# Patient Record
Sex: Female | Born: 1975
Health system: Midwestern US, Community
[De-identification: ages and names within clinical notes are randomized; demographics above are authoritative.]

## PROBLEM LIST (undated history)

## (undated) DIAGNOSIS — M5430 Sciatica, unspecified side: Secondary | ICD-10-CM

## (undated) DIAGNOSIS — R0789 Other chest pain: Secondary | ICD-10-CM

## (undated) DIAGNOSIS — N951 Menopausal and female climacteric states: Principal | ICD-10-CM

## (undated) DIAGNOSIS — R200 Anesthesia of skin: Secondary | ICD-10-CM

## (undated) DIAGNOSIS — G8929 Other chronic pain: Secondary | ICD-10-CM

## (undated) DIAGNOSIS — D3A019 Benign carcinoid tumor of the small intestine, unspecified portion: Secondary | ICD-10-CM

## (undated) DIAGNOSIS — Z0000 Encounter for general adult medical examination without abnormal findings: Secondary | ICD-10-CM

## (undated) DIAGNOSIS — K8689 Other specified diseases of pancreas: Secondary | ICD-10-CM

## (undated) DIAGNOSIS — N644 Mastodynia: Secondary | ICD-10-CM

## (undated) DIAGNOSIS — N3946 Mixed incontinence: Secondary | ICD-10-CM

## (undated) DIAGNOSIS — E28319 Asymptomatic premature menopause: Secondary | ICD-10-CM

## (undated) DIAGNOSIS — J383 Other diseases of vocal cords: Secondary | ICD-10-CM

## (undated) DIAGNOSIS — M7062 Trochanteric bursitis, left hip: Secondary | ICD-10-CM

## (undated) DIAGNOSIS — N62 Hypertrophy of breast: Principal | ICD-10-CM

## (undated) DIAGNOSIS — M545 Low back pain, unspecified: Secondary | ICD-10-CM

## (undated) DIAGNOSIS — C7A011 Malignant carcinoid tumor of the jejunum: Principal | ICD-10-CM

## (undated) DIAGNOSIS — H93293 Other abnormal auditory perceptions, bilateral: Secondary | ICD-10-CM

## (undated) DIAGNOSIS — M7061 Trochanteric bursitis, right hip: Principal | ICD-10-CM

## (undated) DIAGNOSIS — Z1231 Encounter for screening mammogram for malignant neoplasm of breast: Secondary | ICD-10-CM

## (undated) DIAGNOSIS — S161XXA Strain of muscle, fascia and tendon at neck level, initial encounter: Secondary | ICD-10-CM

## (undated) DIAGNOSIS — R1011 Right upper quadrant pain: Secondary | ICD-10-CM

---

## 2001-02-04 ENCOUNTER — Other Ambulatory Visit: Admission: RE | Admit: 2001-02-04 | Discharge: 2001-02-04 | Payer: Self-pay | Admitting: General Practice

## 2002-03-17 ENCOUNTER — Other Ambulatory Visit: Admission: RE | Admit: 2002-03-17 | Discharge: 2002-03-17 | Payer: Self-pay | Admitting: Obstetrics and Gynecology

## 2003-02-24 ENCOUNTER — Ambulatory Visit (HOSPITAL_COMMUNITY): Admission: RE | Admit: 2003-02-24 | Discharge: 2003-02-24 | Payer: Self-pay | Admitting: Obstetrics and Gynecology

## 2003-10-10 ENCOUNTER — Other Ambulatory Visit: Admission: RE | Admit: 2003-10-10 | Discharge: 2003-10-10 | Payer: Self-pay | Admitting: Obstetrics and Gynecology

## 2004-01-17 ENCOUNTER — Inpatient Hospital Stay (HOSPITAL_COMMUNITY): Admission: AD | Admit: 2004-01-17 | Discharge: 2004-01-17 | Payer: Self-pay | Admitting: Obstetrics and Gynecology

## 2004-01-25 ENCOUNTER — Ambulatory Visit (HOSPITAL_COMMUNITY): Admission: RE | Admit: 2004-01-25 | Discharge: 2004-01-25 | Payer: Self-pay | Admitting: Obstetrics and Gynecology

## 2004-07-23 ENCOUNTER — Inpatient Hospital Stay (HOSPITAL_COMMUNITY): Admission: AD | Admit: 2004-07-23 | Discharge: 2004-07-23 | Payer: Self-pay | Admitting: Obstetrics and Gynecology

## 2004-08-28 ENCOUNTER — Inpatient Hospital Stay (HOSPITAL_COMMUNITY): Admission: AD | Admit: 2004-08-28 | Discharge: 2004-08-28 | Payer: Self-pay | Admitting: Obstetrics and Gynecology

## 2004-09-11 ENCOUNTER — Inpatient Hospital Stay (HOSPITAL_COMMUNITY): Admission: AD | Admit: 2004-09-11 | Discharge: 2004-09-14 | Payer: Self-pay | Admitting: Obstetrics and Gynecology

## 2004-09-15 ENCOUNTER — Encounter: Admission: RE | Admit: 2004-09-15 | Discharge: 2004-10-15 | Payer: Self-pay | Admitting: Obstetrics and Gynecology

## 2004-10-16 ENCOUNTER — Encounter: Admission: RE | Admit: 2004-10-16 | Discharge: 2004-11-15 | Payer: Self-pay | Admitting: Obstetrics and Gynecology

## 2004-10-22 ENCOUNTER — Other Ambulatory Visit: Admission: RE | Admit: 2004-10-22 | Discharge: 2004-10-22 | Payer: Self-pay | Admitting: Obstetrics and Gynecology

## 2004-12-16 ENCOUNTER — Encounter: Admission: RE | Admit: 2004-12-16 | Discharge: 2005-01-15 | Payer: Self-pay | Admitting: Obstetrics and Gynecology

## 2005-02-15 ENCOUNTER — Encounter: Admission: RE | Admit: 2005-02-15 | Discharge: 2005-03-17 | Payer: Self-pay | Admitting: Obstetrics and Gynecology

## 2005-03-18 ENCOUNTER — Encounter: Admission: RE | Admit: 2005-03-18 | Discharge: 2005-04-16 | Payer: Self-pay | Admitting: Obstetrics and Gynecology

## 2005-04-17 ENCOUNTER — Encounter: Admission: RE | Admit: 2005-04-17 | Discharge: 2005-05-17 | Payer: Self-pay | Admitting: Obstetrics and Gynecology

## 2005-05-18 ENCOUNTER — Encounter: Admission: RE | Admit: 2005-05-18 | Discharge: 2005-06-16 | Payer: Self-pay | Admitting: Obstetrics and Gynecology

## 2005-06-02 ENCOUNTER — Encounter: Admission: RE | Admit: 2005-06-02 | Discharge: 2005-06-02 | Payer: Self-pay | Admitting: Family Medicine

## 2005-06-11 IMAGING — US US OB TRANSVAGINAL
1 series · 18 of 28 positions shown · non-contrast
Comparison: none

CLINICAL DATA: Reevaluate early gestation.
TRANSVAGINAL OBSTETRICAL ULTRASOUND:
Multiple images of the gravid uterus were obtained using an endovaginal approach.
There is a single intrauterine pregnancy identified that demonstrates an estimated gestational age by crown rump length of 6 weeks and 6 days.  Positive regular fetal cardiac activity with a rate of 120 bpm was noted.  A normal appearing yolk sac and amnion are seen.  No evidence for subchorionic hemorrhage is noted.  
Both ovaries are visualized with the right ovary measuring 3.6 x 3.0 x 2.1 cm and containing a corpus luteum cyst.  The left ovary measures 1.6 x 2.6 x 1.7 cm and has a normal appearance.  No cul-de-sac or periovarian fluid is noted.  No separate adnexal masses are seen.
IMPRESSION
6 week 6 day living intrauterine pregnancy.

[Series 1: us ob transvaginal · 18 of 34 slices shown]
[im 1/34]
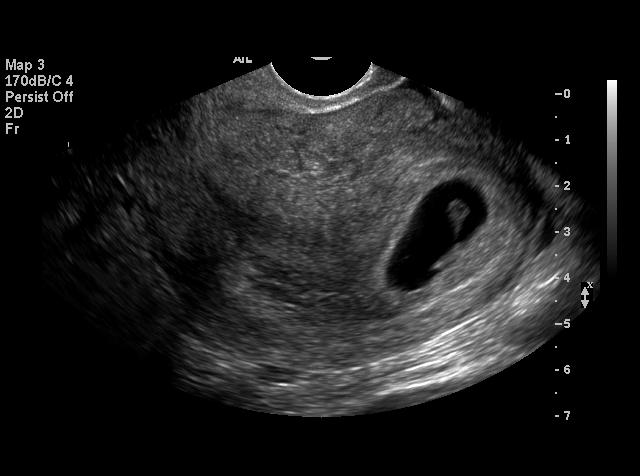
[im 3/34]
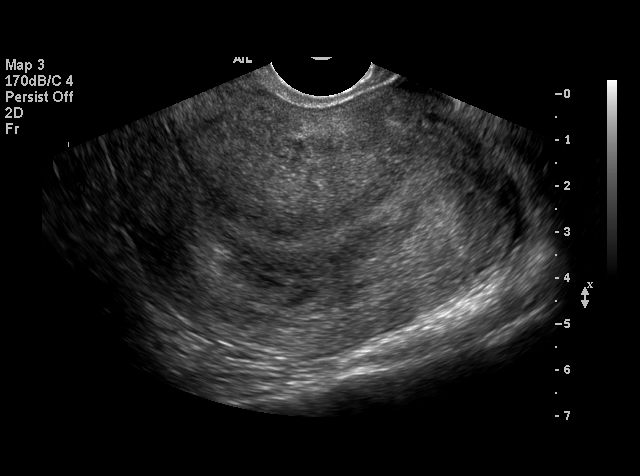
[im 4/34]
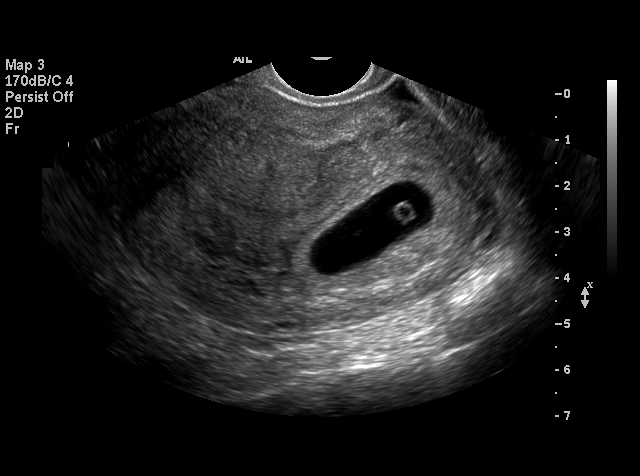
[im 7/34]
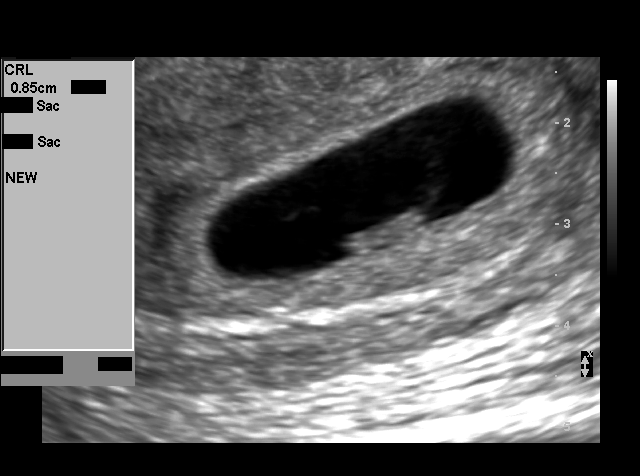
[im 9/34]
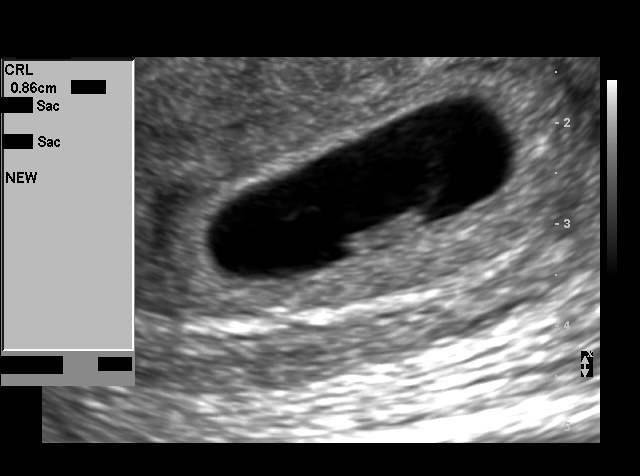
[im 10/34]
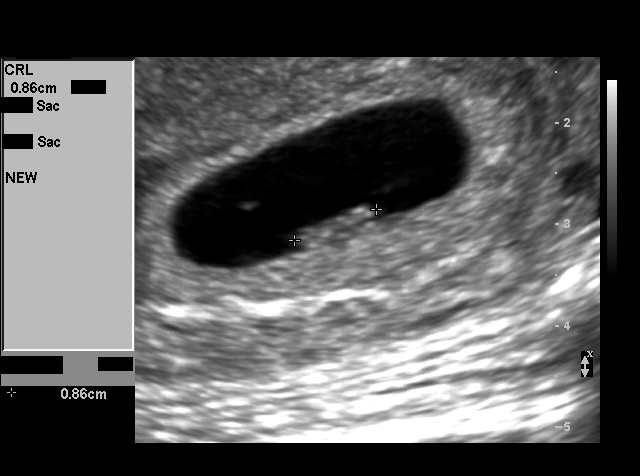
[im 13/34]
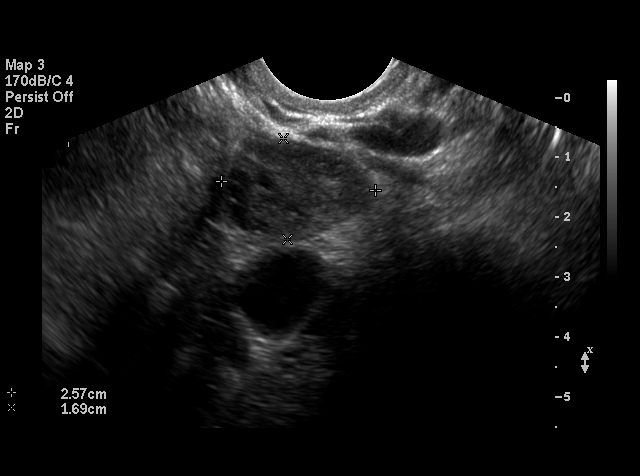
[im 14/34]
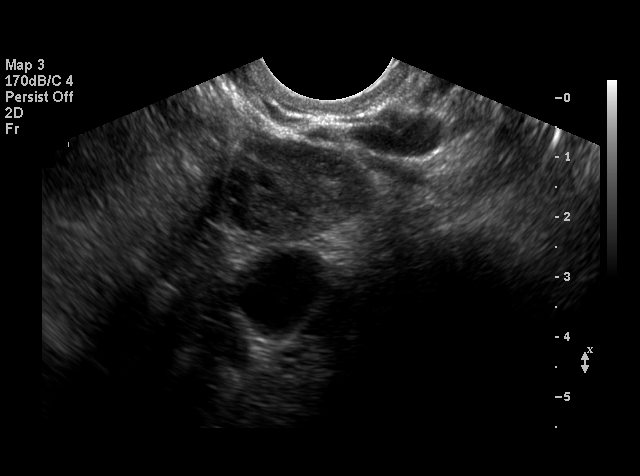
[im 16/34]
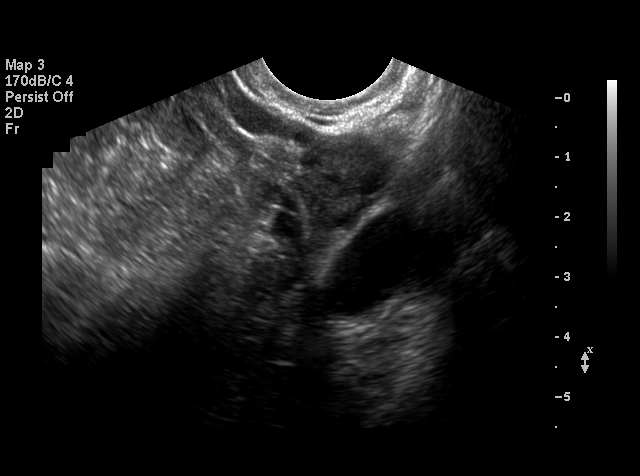
[im 18/34]
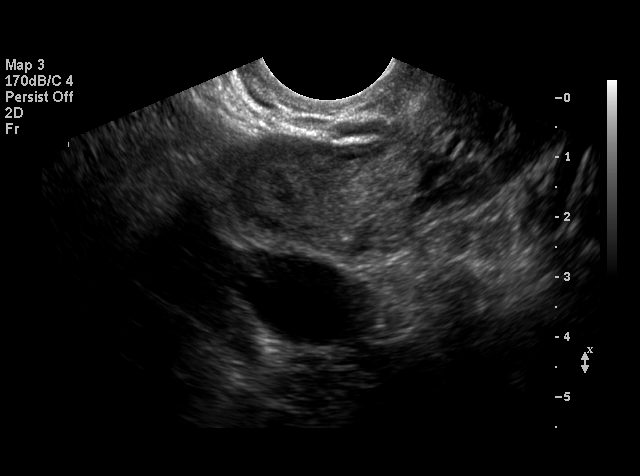
[im 20/34]
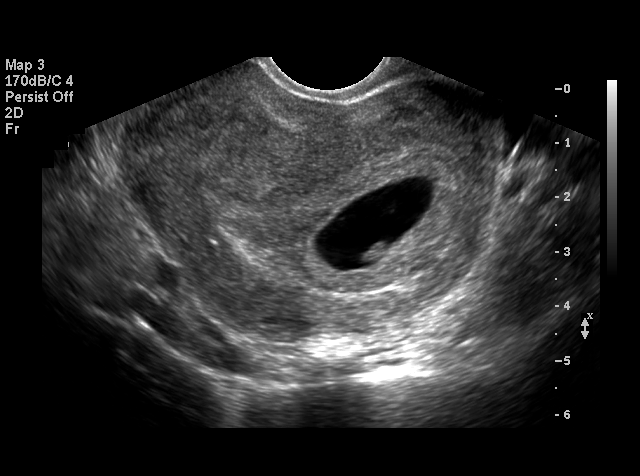
[im 21/34]
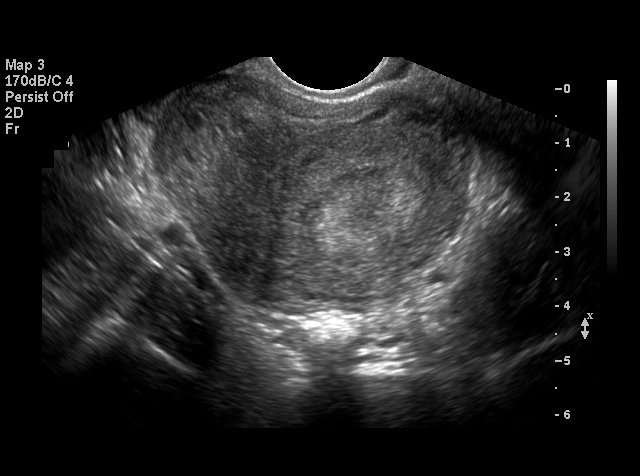
[im 24/34]
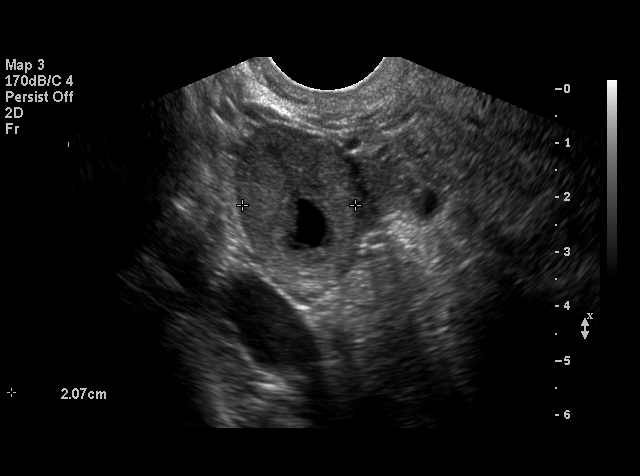
[im 26/34]
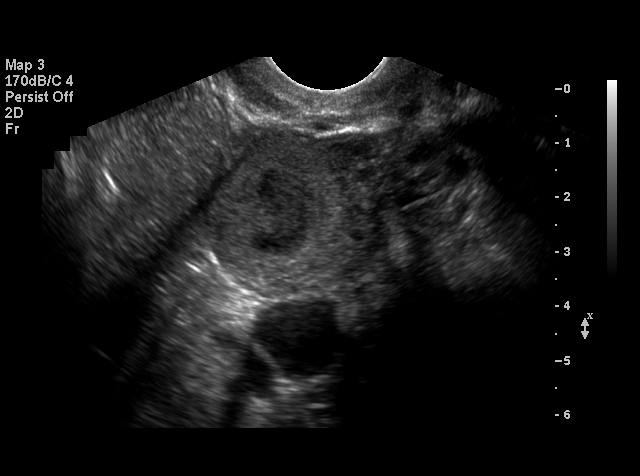
[im 27/34]
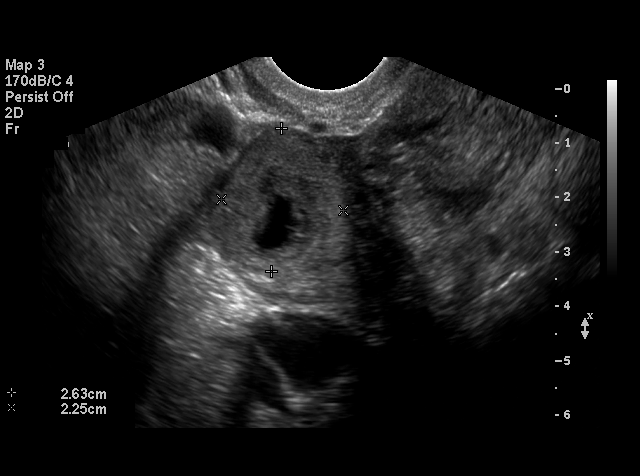
[im 30/34]
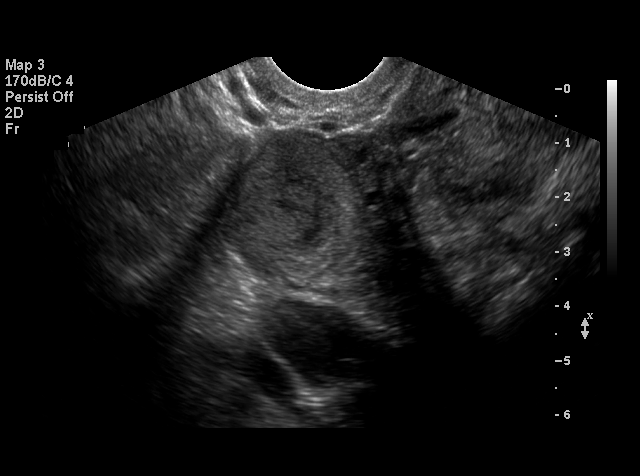
[im 31/34]
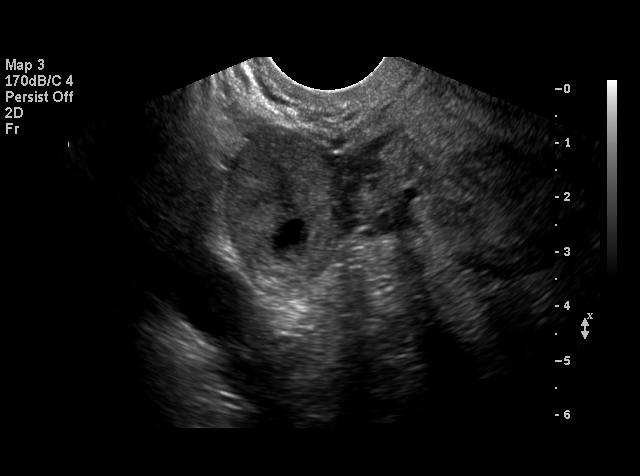
[im 34/34]
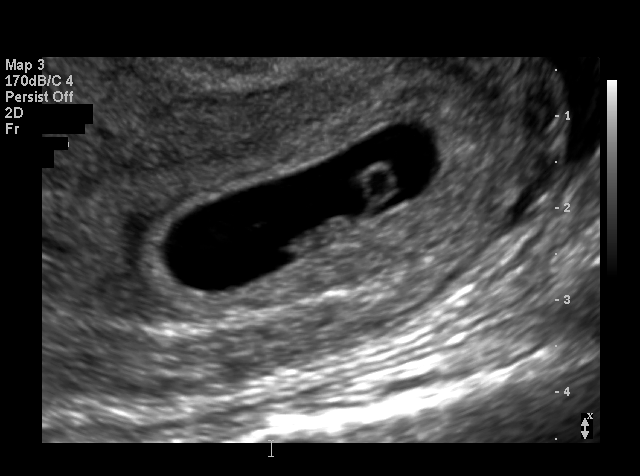

[18 of 28 positions shown; findings below may reference images not displayed]

## 2005-06-17 ENCOUNTER — Encounter: Admission: RE | Admit: 2005-06-17 | Discharge: 2005-07-11 | Payer: Self-pay | Admitting: Obstetrics and Gynecology

## 2005-12-15 ENCOUNTER — Other Ambulatory Visit: Admission: RE | Admit: 2005-12-15 | Discharge: 2005-12-15 | Payer: Self-pay | Admitting: Obstetrics and Gynecology

## 2006-12-22 ENCOUNTER — Emergency Department (HOSPITAL_COMMUNITY): Admission: EM | Admit: 2006-12-22 | Discharge: 2006-12-22 | Payer: Self-pay | Admitting: Emergency Medicine

## 2006-12-25 ENCOUNTER — Other Ambulatory Visit: Admission: RE | Admit: 2006-12-25 | Discharge: 2006-12-25 | Payer: Self-pay | Admitting: Obstetrics and Gynecology

## 2008-01-25 ENCOUNTER — Other Ambulatory Visit: Admission: RE | Admit: 2008-01-25 | Discharge: 2008-01-25 | Payer: Self-pay | Admitting: Family Medicine

## 2008-05-08 IMAGING — US US PELVIS COMPLETE MODIFY
1 series · 14 of 25 positions shown · non-contrast
Comparison: none

CLINICAL DATA: 31-year-old female with pelvic pain with negative pregnancy test.   
 TRANSABDOMINAL AND TRANSVAGINAL PELVIC ULTRASOUND:
TECHNIQUE: Both transabdominal and transvaginal ultrasound examinations of the pelvis were performed including evaluation of the uterus, ovaries, adnexal regions, and pelvic cul-de-sac.

[Series 1: unknown · 0.27mm/px · 14 of 50 slices shown]
[im 1/50]
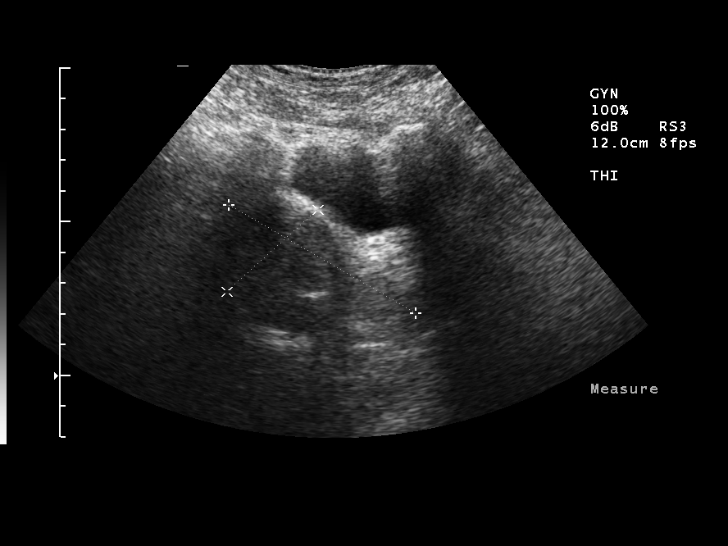
[im 5/50]
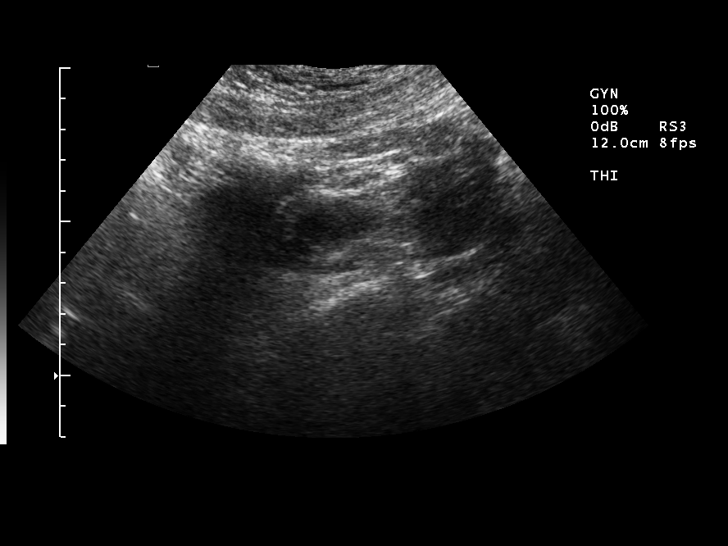
[im 9/50]
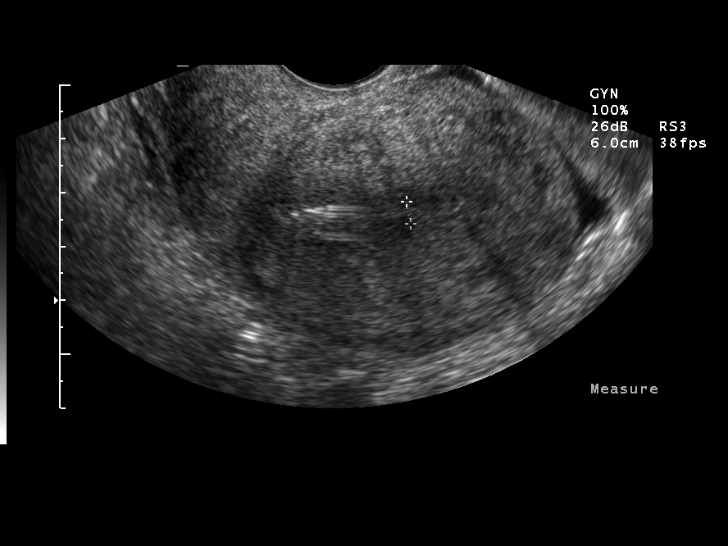
[im 13/50]
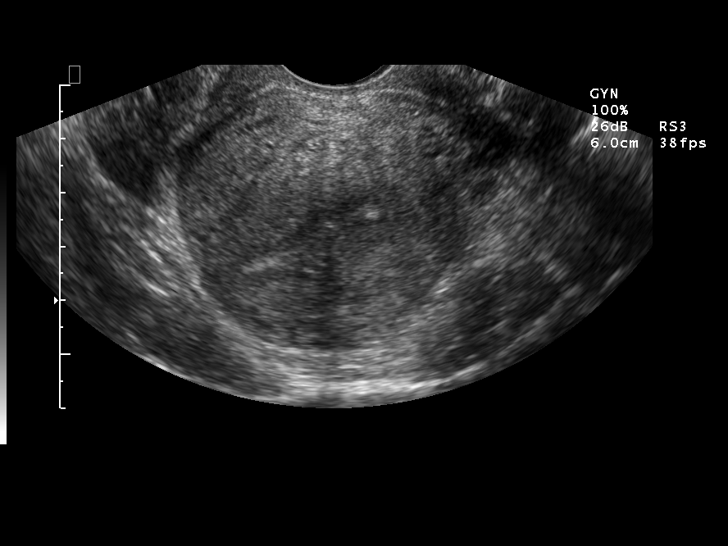
[im 17/50]
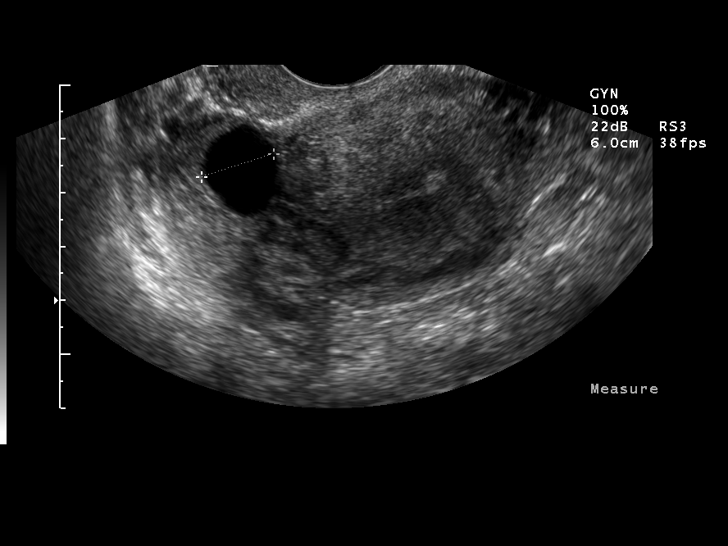
[im 19/50]
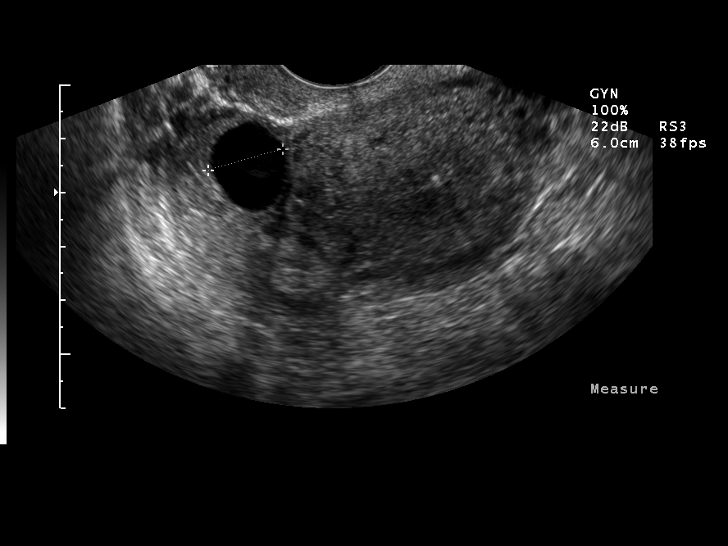
[im 23/50]
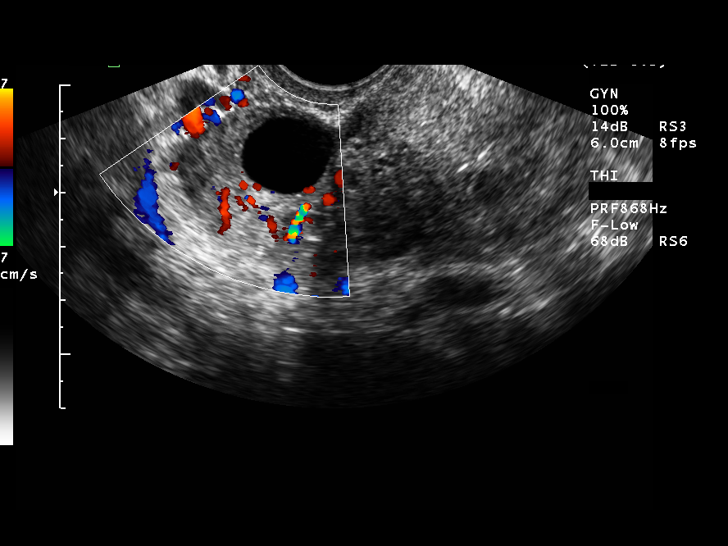
[im 27/50]
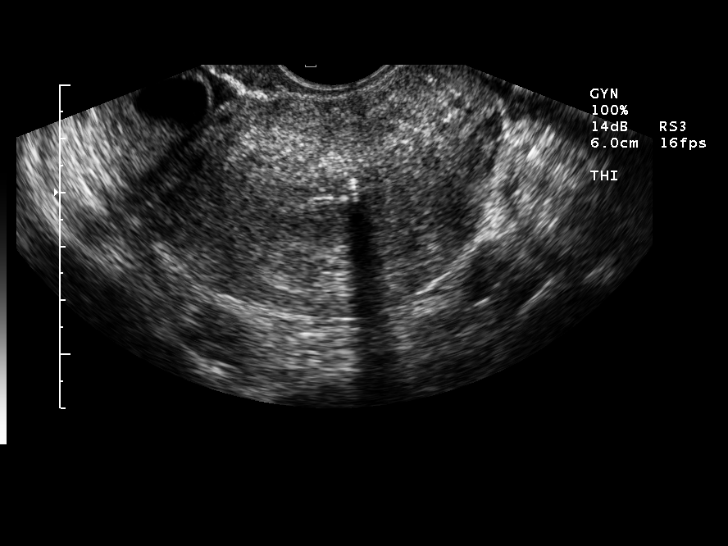
[im 31/50]
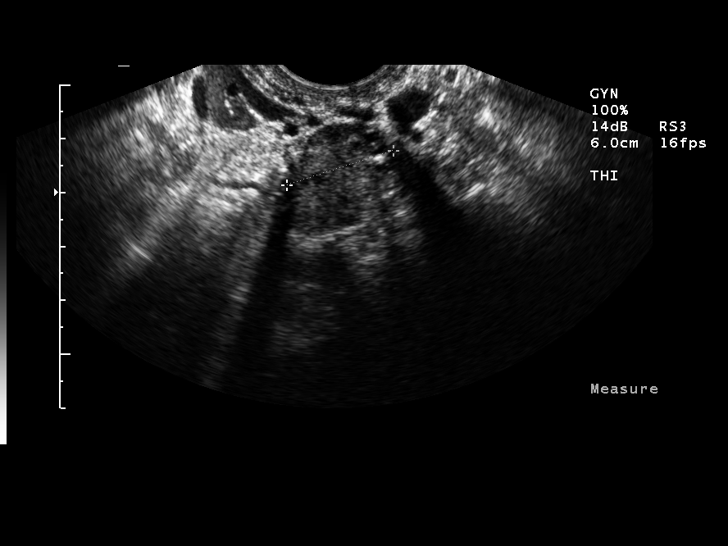
[im 33/50]
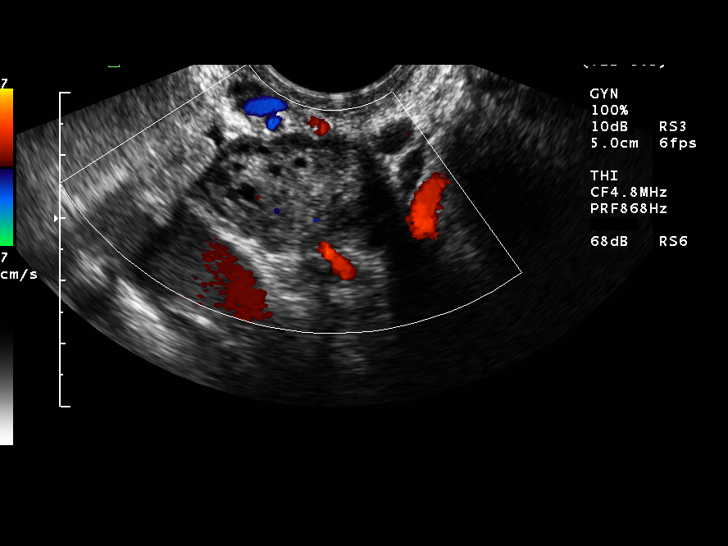
[im 37/50]
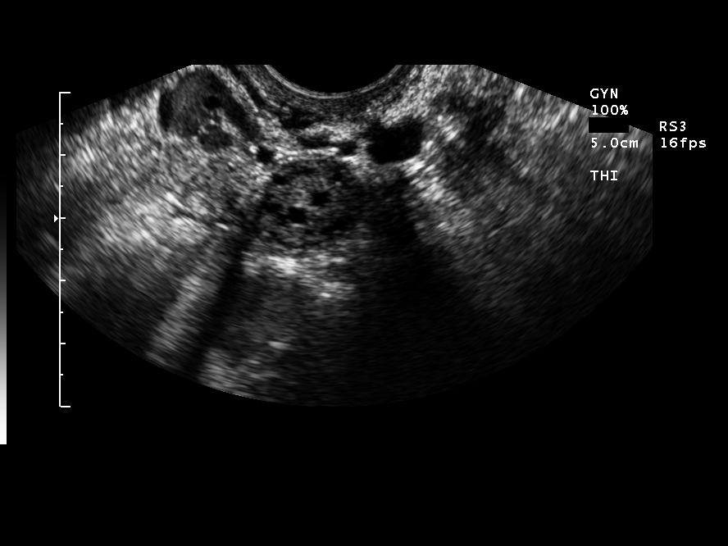
[im 41/50]
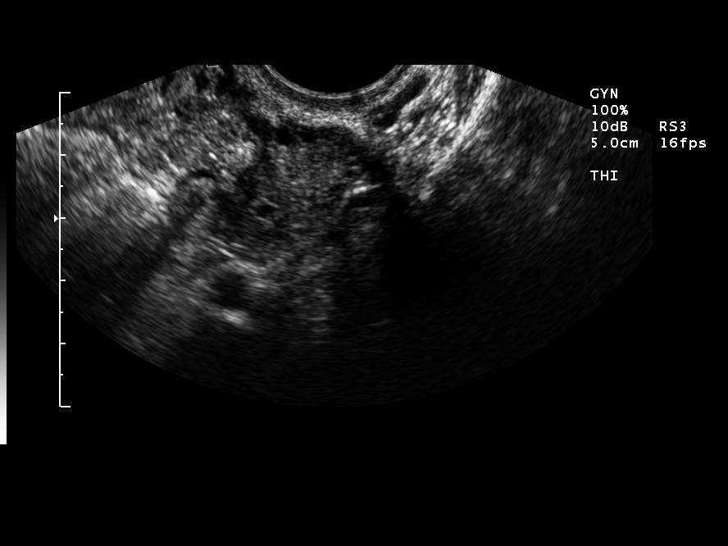
[im 45/50]
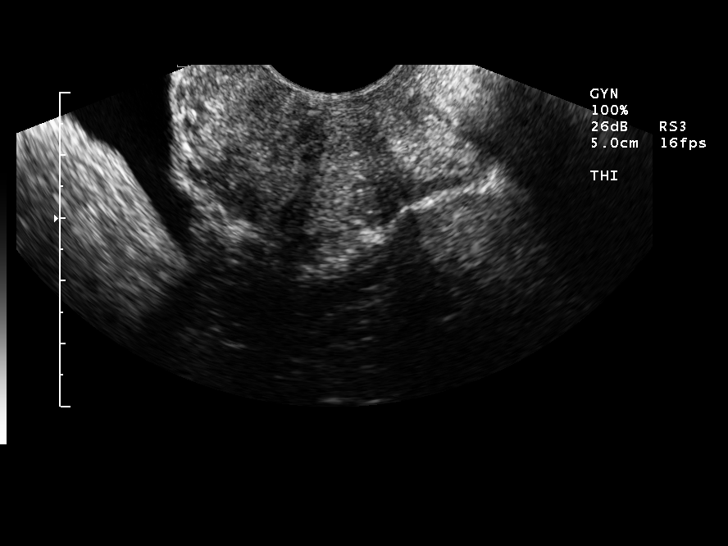
[im 50/50]
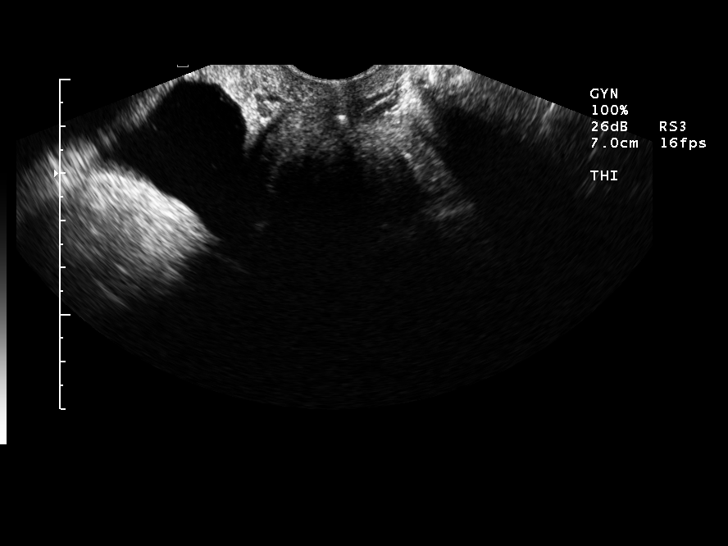

[14 of 25 positions shown; findings below may reference images not displayed]

FINDINGS: The uterus is retroverted measuring 9.5 x 5 x 6.3 cm.  An IUD is in place within the endometrial canal.  Endometrial stripe is grossly unremarkable measuring up to 4 mm in greatest diameter.  A 2 x 1.5 cm cyst/follicle within the right ovary is noted.  Remainder of the ovaries is unremarkable.  A small to moderate amount of free fluid within the pelvis is identified.
 There is no evidence of adnexal mass.
IMPRESSION: 1.   Small to moderate amount of free fluid within the pelvis - nonspecific. 
 2.  2 x 1.5 cm right ovarian cyst/follicle.
 3.  IUD.

## 2009-02-13 ENCOUNTER — Emergency Department (HOSPITAL_COMMUNITY): Admission: EM | Admit: 2009-02-13 | Discharge: 2009-02-14 | Payer: Self-pay | Admitting: Emergency Medicine

## 2009-04-17 ENCOUNTER — Other Ambulatory Visit: Admission: RE | Admit: 2009-04-17 | Discharge: 2009-04-17 | Payer: Self-pay | Admitting: Family Medicine

## 2009-05-21 ENCOUNTER — Encounter: Admission: RE | Admit: 2009-05-21 | Discharge: 2009-05-21 | Payer: Self-pay | Admitting: Diagnostic Neuroimaging

## 2009-09-25 ENCOUNTER — Ambulatory Visit (HOSPITAL_COMMUNITY): Admission: RE | Admit: 2009-09-25 | Discharge: 2009-09-25 | Payer: Self-pay | Admitting: General Surgery

## 2010-05-08 ENCOUNTER — Other Ambulatory Visit: Admission: RE | Admit: 2010-05-08 | Discharge: 2010-05-08 | Payer: Self-pay | Admitting: Obstetrics and Gynecology

## 2010-08-10 ENCOUNTER — Emergency Department (HOSPITAL_COMMUNITY)
Admission: EM | Admit: 2010-08-10 | Discharge: 2010-08-10 | Payer: Self-pay | Source: Home / Self Care | Admitting: Emergency Medicine

## 2010-09-02 ENCOUNTER — Encounter (HOSPITAL_COMMUNITY): Payer: Self-pay

## 2010-09-03 ENCOUNTER — Ambulatory Visit (HOSPITAL_COMMUNITY)
Admission: RE | Admit: 2010-09-03 | Discharge: 2010-09-03 | Disposition: A | Discharge status: Home / Self Care | Payer: 59 | Source: Ambulatory Visit | Attending: Cardiology | Admitting: Cardiology

## 2010-09-03 DIAGNOSIS — R0609 Other forms of dyspnea: Secondary | ICD-10-CM | POA: Insufficient documentation

## 2010-09-03 DIAGNOSIS — R0989 Other specified symptoms and signs involving the circulatory and respiratory systems: Secondary | ICD-10-CM | POA: Insufficient documentation

## 2010-10-06 LAB — URINALYSIS, ROUTINE W REFLEX MICROSCOPIC
Bilirubin Urine: NEGATIVE
Glucose, UA: NEGATIVE mg/dL
Hgb urine dipstick: NEGATIVE
Ketones, ur: NEGATIVE mg/dL
Nitrite: NEGATIVE
Protein, ur: NEGATIVE mg/dL
Specific Gravity, Urine: 1.015 (ref 1.005–1.030)
Urobilinogen, UA: 0.2 mg/dL (ref 0.0–1.0)
pH: 8 (ref 5.0–8.0)

## 2010-10-06 LAB — CBC
HCT: 38.4 % (ref 36.0–46.0)
Hemoglobin: 12 g/dL (ref 12.0–15.0)
MCHC: 31.3 g/dL (ref 30.0–36.0)
MCV: 75.5 fL — ABNORMAL LOW (ref 78.0–100.0)
Platelets: 264 10*3/uL (ref 150–400)
RBC: 5.09 MIL/uL (ref 3.87–5.11)
RDW: 13.9 % (ref 11.5–15.5)
WBC: 4.6 10*3/uL (ref 4.0–10.5)

## 2010-10-06 LAB — BASIC METABOLIC PANEL
BUN: 8 mg/dL (ref 6–23)
CO2: 25 mEq/L (ref 19–32)
Calcium: 8.8 mg/dL (ref 8.4–10.5)
Chloride: 107 mEq/L (ref 96–112)
Creatinine, Ser: 0.74 mg/dL (ref 0.4–1.2)
GFR calc Af Amer: >60 mL/min (ref >60)
GFR calc non Af Amer: >60 mL/min (ref >60)
Glucose, Bld: 81 mg/dL (ref 70–99)
Potassium: 4.2 mEq/L (ref 3.5–5.1)
Sodium: 138 mEq/L (ref 135–145)

## 2010-10-06 LAB — PREGNANCY, URINE: Preg Test, Ur: NEGATIVE

## 2010-10-06 IMAGING — CR DG CHEST 2V
2 series · 2 of 2 positions shown · non-contrast
Comparison: None

CLINICAL DATA: Disturbance of skin sensation with dizziness and
diplopia.  Cough.  Elevated ACE level

CHEST - 2 VIEW

[w chest pa]
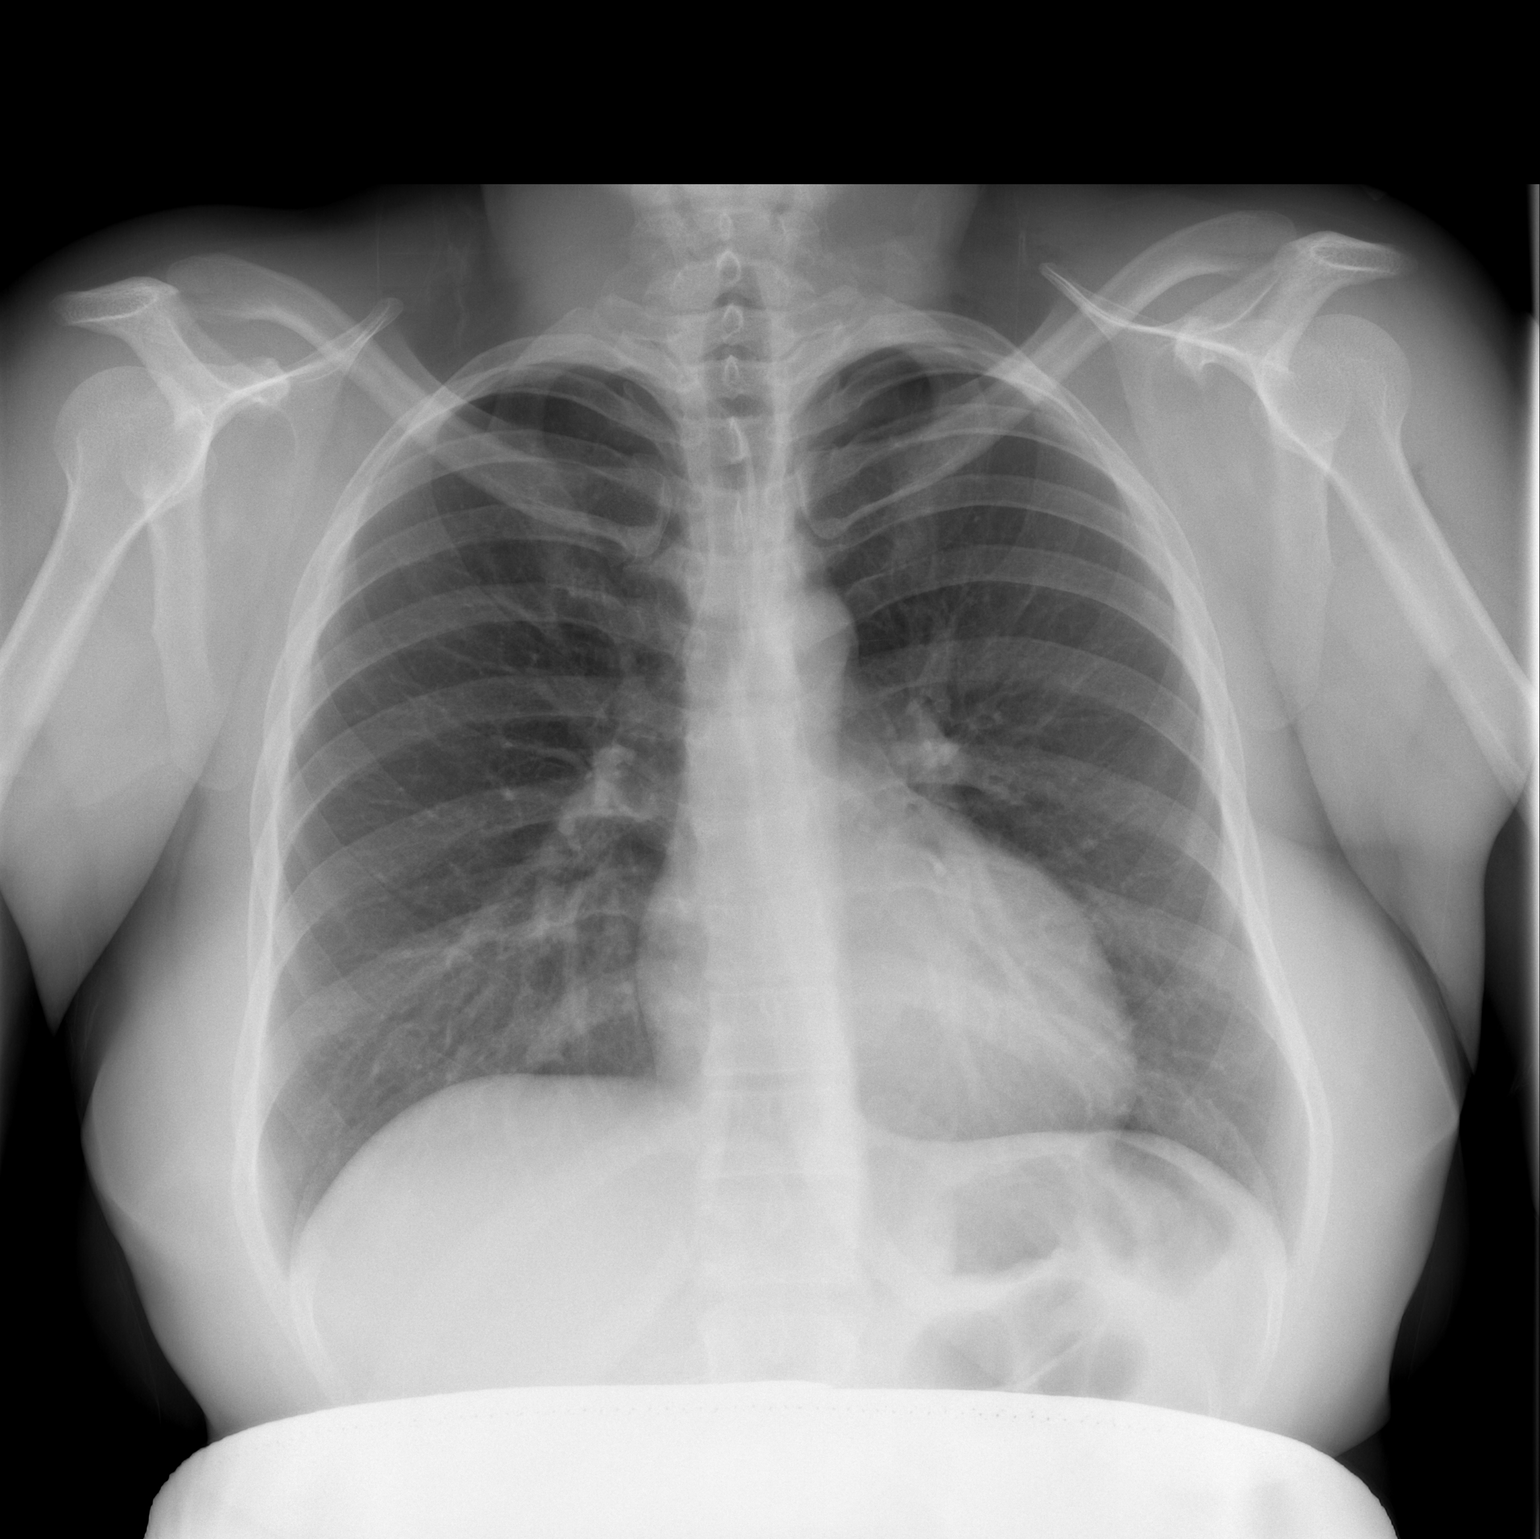

[w chest lat]
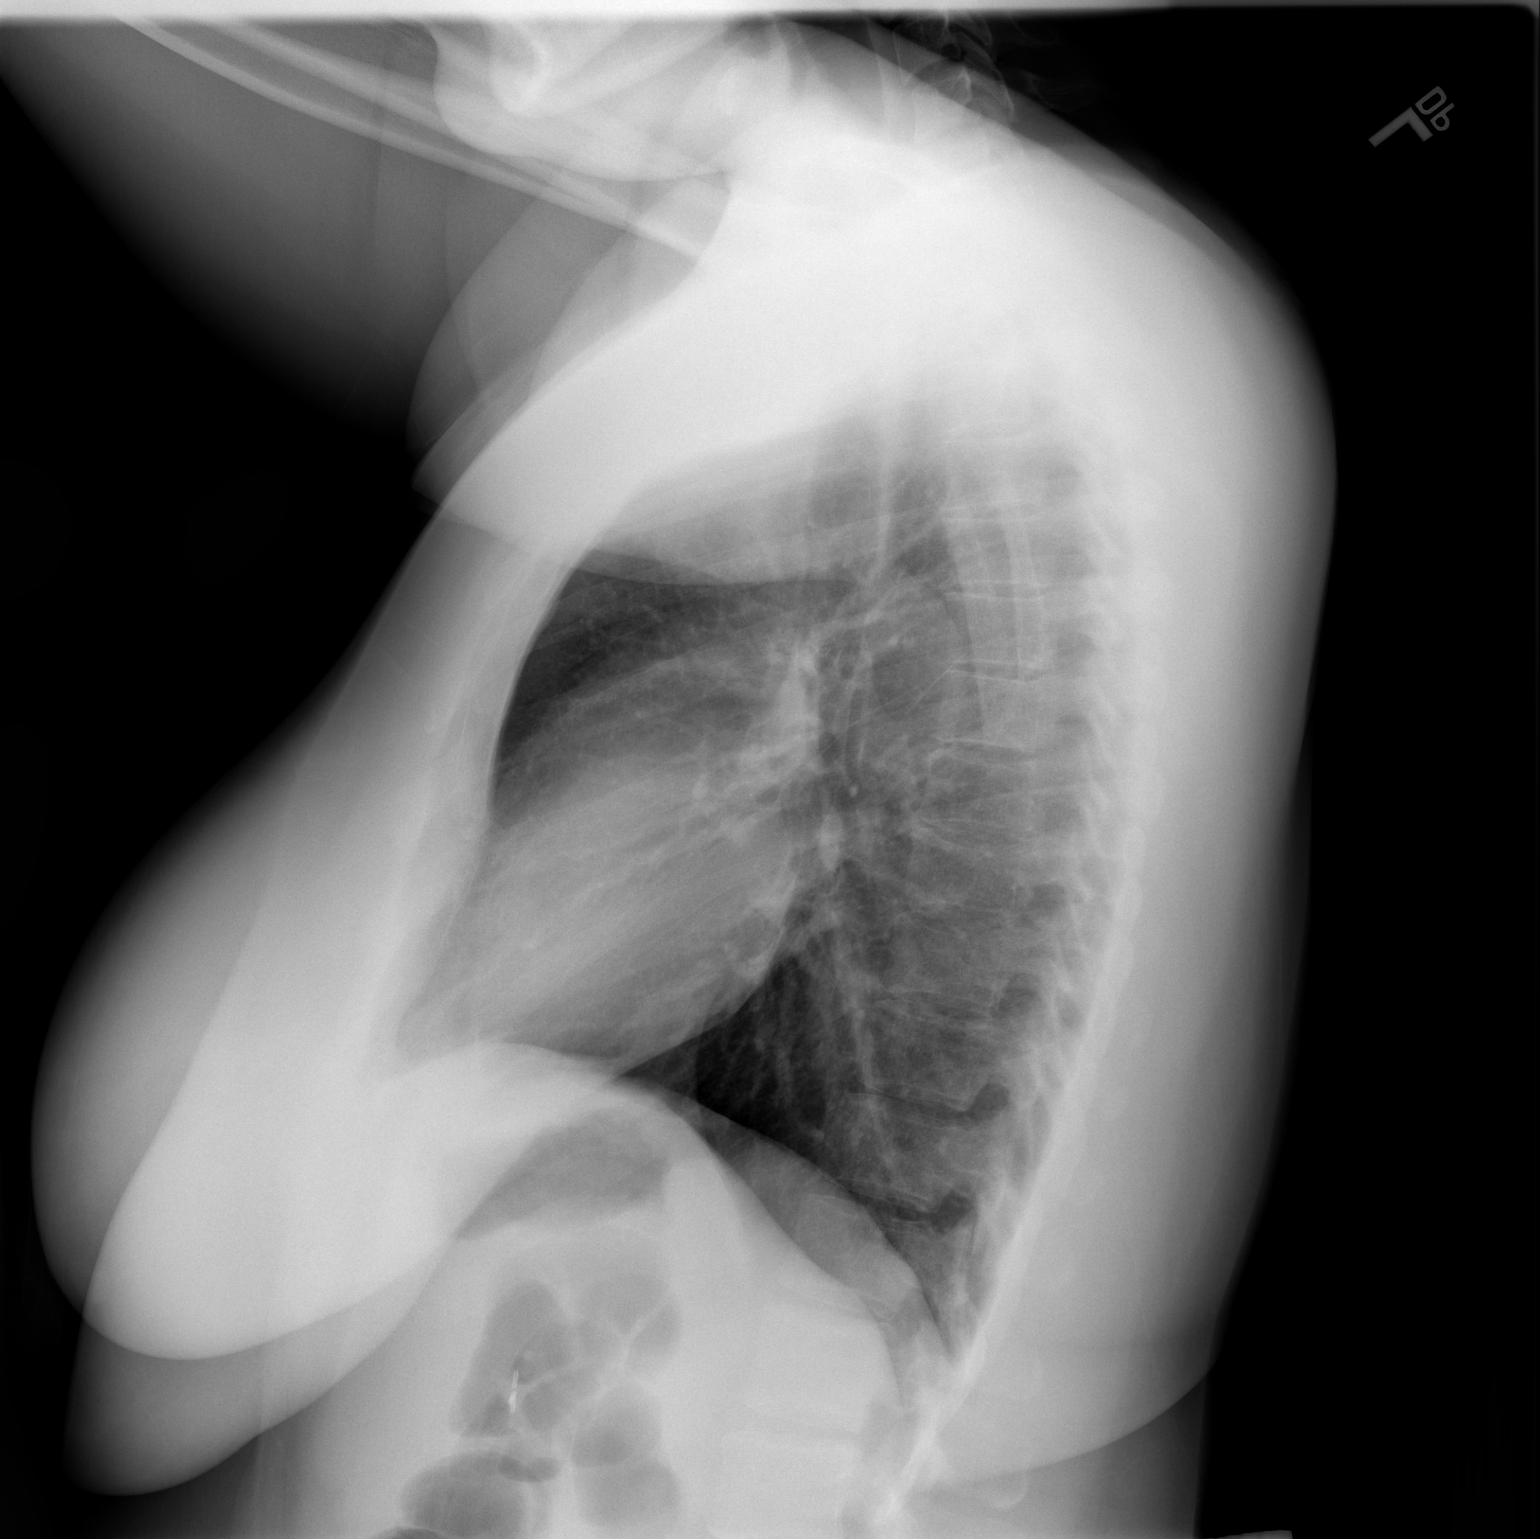

[2 of 2 positions shown; findings below may reference images not displayed]

FINDINGS: Heart and mediastinal contours are within normal limits.
The lung fields are clear with no evidence for focal infiltrate or
congestive failure.  Bony structures are intact.
IMPRESSION: No acute or focal cardiopulmonary abnormality noted.

## 2010-11-29 NOTE — H&P (Signed)
NAME:  Sherri Chavez, Sherri Chavez                         ACCOUNT NO.:  [id number]   MEDICAL RECORD NO.:  [id number]                   PATIENT TYPE:  AMB   LOCATION:  [facility]                                  FACILITY:  [facility]   PHYSICIAN:  James A. Ashley Royalty, M.D.             DATE OF BIRTH:  26-Aug-1975   DATE OF ADMISSION:  02/24/2003  DATE OF DISCHARGE:                                HISTORY & PHYSICAL   HISTORY OF PRESENT ILLNESS:  This is a 35 year old, gravida 4, para 1, AB 3,  who presented to me on February 16, 2003, complaining of lower abdominal/pelvic  discomfort.  She stated that the process had been occurring for  approximately a year, but had intensified over the last month or so.  She  described it as intermittent, however, also debilitating.  Location is  mostly on the left side, though occasionally it radiates to the right.  She  denied any associated symptoms.  She got married in March of 2004.  She was  not having sex with her husband until that time and therefore he represents  a new sexual partner as of four to five months ago.  She currently using  condoms for contraception.  She presented to my office for ultrasound on  February 23, 2003, pursuant to this discomfort.  The ultrasound revealed a  normal uterus and adnexa.  We discussed other diagnostic possibilities that  could be causing the discomfort, including endometriosis, adhesions, primary  GI, etc.  The patient stated that her symptoms were sufficiently  debilitating that she wanted to proceed with operative intervention.  Hence,  she is for diagnostic/operative laparoscopy.   MEDICATIONS:  Levaquin 500 mg daily.   PAST MEDICAL HISTORY:  Negative.   PAST SURGICAL HISTORY:  1. Cholecystectomy.  2. LEEP.  3. Unknown surgery for acid reflux.   ALLERGIES:  CODEINE AND ANY OTHER DERIVATIVE THEREOF.   FAMILY HISTORY:  Positive for colon cancer, hypertension, diabetes, and  uterine cancer.   SOCIAL HISTORY:  The patient  denies the use of tobacco or significant  alcohol.   REVIEW OF SYSTEMS:  Noncontributory.   PHYSICAL EXAMINATION:  GENERAL APPEARANCE:  A well-developed, well-  nourished, pleasant female in no acute distress.  VITAL SIGNS:  Afebrile.  Vital signs stable.  SKIN:  Warm dry without lesions.  LYMPHATICS:  No supraclavicular, cervical, or inguinal adenopathy.  HEENT:  Normocephalic.  NECK:  Supple without thyromegaly.  CHEST:  Lungs clear.  CARDIAC:  Regular rate and rhythm without murmurs, rubs, or gallops.  BREASTS:  Exam was deferred.  ABDOMEN:  Soft and nontender without masses or organomegaly.  Bowel sounds  are active.  MUSCULOSKELETAL:  Full range of motion without CVA tenderness.  PELVIC:  (February 16, 2003) External genitalia within normal limits.  The  vagina and cervix were without gross lesions.  Bimanual examination revealed  the uterus to be retroflexed versus a possible  posterior fibroid.  No  obvious adnexal masses were palpable.   IMPRESSION:  Lower abdominal/pelvic discomfort, debilitating.  The  differential includes adnexal pathology, primary gastrointestinal adhesions,  and endometriosis.   PLAN:  Diagnostic/operative laparoscopy.  The risks, benefits,  complications, and alternatives were fully discussed with the patient.  The  possibility of unilateral salpingo-oophorectomy was discussed and accepted.  The possibility of exploratory laparotomy was discussed and accepted.  Questions were invited and answered.                                               James A. Ashley Royalty, M.D.    JAM/MEDQ  D:  02/24/2003  T:  02/24/2003  Job:  [id number]

## 2010-11-29 NOTE — Op Note (Signed)
NAME:  Sherri Chavez, Sherri Chavez                         ACCOUNT NO.:  [id number]   MEDICAL RECORD NO.:  [id number]                   PATIENT TYPE:  AMB   LOCATION:  [facility]                                  FACILITY:  [facility]   PHYSICIAN:  James A. Ashley Royalty, M.D.             DATE OF BIRTH:  Nov 23, 1975   DATE OF PROCEDURE:  DATE OF DISCHARGE:                                 OPERATIVE REPORT   PREOPERATIVE DIAGNOSIS:  Pelvic pain.   POSTOPERATIVE DIAGNOSES:  1. Pelvic pain.  2. Normal pelvis.   PROCEDURE:  Diagnostic laparoscopy.   SURGEON:  Rudy Jew. Ashley Royalty, M.D.   ANESTHESIA:  General endotracheal.   ESTIMATED BLOOD LOSS:  Less than 25 mL.   COMPLICATIONS:  None.   PACKS AND DRAINS:  None.   PROCEDURE:  The patient was taken to the operating room and placed in the  dorsal supine position.  After adequate general anesthesia was administered,  she was placed in the lithotomy position and prepped and draped in the usual  manner for vaginal surgery.  A posterior weighted retractor was placed per  vagina.  The anterior lip of the cervix was grasped with a single-tooth  tenaculum.  Jarcho uterine manipulator was placed per cervix and held in  place with a tenaculum.  The bladder was drained with a red rubber catheter.  Next a 1.2 cm infraumbilical incision was made in the longitudinal plane.  A  size 10-11 disposable laparoscopic trocar was placed into the abdominal  cavity.  Its location was verified by placement of the laparoscope.  There  was no evidence of any trauma.  A pneumoperitoneum was created with CO2 and  maintained throughout the procedure.  Next, a single suprapubic 5-mm trocar  was placed in the left lower quadrants using transillumination and direct  visualization techniques.  The pelvis was thoroughly inspected.  The uterus  was normal size, shape, and contour without evidence of any polyps,  fibroids, or endometriosis.  The fallopian tubes were normal size, shape,  contour,  and length.  The ovaries were normal size, shape, and contour  without any evidence of any cysts or endometriosis.  Anterior and posterior  cul-de-sacs were clean.  The remainder of the peritoneal surfaces were  smooth and glistening.  There was absolutely no evidence of any  endometriosis or adhesions.  Appropriate photos were obtained.  Right after  this point the patient was felt to have benefitted maximum from the surgical  procedure.  The abdominal instruments were removed and pneumoperitoneum  evacuated.  The fascial defects were closed with 0 Vicryl in an interrupted  fashion.  The skin  was closed with 3-0 Monocryl in a subcuticular fashion.  Vaginal instruments  were removed.  Hemostasis was noted.  The procedure was terminated.   The patient was returned to the recovery room in good condition.  James A. Ashley Royalty, M.D.    JAM/MEDQ  D:  02/24/2003  T:  02/25/2003  Job:  [id number]

## 2011-05-01 LAB — URINALYSIS, ROUTINE W REFLEX MICROSCOPIC
Bilirubin Urine: NEGATIVE
Glucose, UA: NEGATIVE
Hgb urine dipstick: NEGATIVE
Ketones, ur: NEGATIVE
Nitrite: NEGATIVE
Protein, ur: NEGATIVE
Specific Gravity, Urine: 1.02
Urobilinogen, UA: 1
pH: 6.5

## 2011-05-01 LAB — GC/CHLAMYDIA PROBE AMP, GENITAL
Chlamydia, DNA Probe: NEGATIVE
GC Probe Amp, Genital: NEGATIVE

## 2011-05-01 LAB — WET PREP, GENITAL
Trich, Wet Prep: NONE SEEN
Yeast Wet Prep HPF POC: NONE SEEN

## 2011-05-01 LAB — URINE MICROSCOPIC-ADD ON: WBC, UA: NONE SEEN

## 2011-05-01 LAB — POCT PREGNANCY, URINE
Operator id: 29071
Preg Test, Ur: NEGATIVE

## 2011-08-27 ENCOUNTER — Other Ambulatory Visit: Payer: Self-pay | Admitting: Obstetrics and Gynecology

## 2011-08-27 DIAGNOSIS — Z01419 Encounter for gynecological examination (general) (routine) without abnormal findings: Secondary | ICD-10-CM | POA: Insufficient documentation

## 2011-10-12 ENCOUNTER — Other Ambulatory Visit (HOSPITAL_COMMUNITY)
Admission: RE | Admit: 2011-10-12 | Discharge: 2011-10-12 | Disposition: A | Discharge status: Home / Self Care | Payer: 59 | Source: Ambulatory Visit | Attending: Obstetrics and Gynecology | Admitting: Obstetrics and Gynecology

## 2012-09-14 ENCOUNTER — Other Ambulatory Visit (HOSPITAL_COMMUNITY)
Admission: RE | Admit: 2012-09-14 | Discharge: 2012-09-14 | Disposition: A | Discharge status: Home / Self Care | Payer: 59 | Source: Ambulatory Visit | Attending: Obstetrics and Gynecology | Admitting: Obstetrics and Gynecology

## 2012-09-14 ENCOUNTER — Other Ambulatory Visit: Payer: Self-pay | Admitting: Obstetrics and Gynecology

## 2012-09-14 DIAGNOSIS — Z1151 Encounter for screening for human papillomavirus (HPV): Secondary | ICD-10-CM | POA: Insufficient documentation

## 2012-09-14 DIAGNOSIS — Z01419 Encounter for gynecological examination (general) (routine) without abnormal findings: Secondary | ICD-10-CM | POA: Insufficient documentation

## 2013-09-13 ENCOUNTER — Other Ambulatory Visit (HOSPITAL_COMMUNITY)
Admission: RE | Admit: 2013-09-13 | Discharge: 2013-09-13 | Disposition: A | Discharge status: Home / Self Care | Payer: 59 | Source: Ambulatory Visit | Attending: Obstetrics and Gynecology | Admitting: Obstetrics and Gynecology

## 2013-09-13 ENCOUNTER — Other Ambulatory Visit: Payer: Self-pay | Admitting: Obstetrics and Gynecology

## 2013-09-13 DIAGNOSIS — Z01419 Encounter for gynecological examination (general) (routine) without abnormal findings: Secondary | ICD-10-CM | POA: Insufficient documentation

## 2013-11-17 ENCOUNTER — Ambulatory Visit
Admission: RE | Admit: 2013-11-17 | Discharge: 2013-11-17 | Disposition: A | Discharge status: Home / Self Care | Payer: 59 | Source: Ambulatory Visit | Attending: Family Medicine | Admitting: Family Medicine

## 2013-11-17 ENCOUNTER — Other Ambulatory Visit: Payer: Self-pay | Admitting: Family Medicine

## 2013-11-17 DIAGNOSIS — R079 Chest pain, unspecified: Secondary | ICD-10-CM

## 2014-09-25 ENCOUNTER — Other Ambulatory Visit: Payer: Self-pay | Admitting: Obstetrics and Gynecology

## 2014-09-25 ENCOUNTER — Other Ambulatory Visit (HOSPITAL_COMMUNITY)
Admission: RE | Admit: 2014-09-25 | Discharge: 2014-09-25 | Disposition: A | Discharge status: Home / Self Care | Payer: 59 | Source: Ambulatory Visit | Attending: Obstetrics and Gynecology | Admitting: Obstetrics and Gynecology

## 2014-09-25 DIAGNOSIS — Z01419 Encounter for gynecological examination (general) (routine) without abnormal findings: Secondary | ICD-10-CM | POA: Diagnosis not present

## 2014-09-27 LAB — CYTOLOGY - PAP

## 2015-11-14 ENCOUNTER — Other Ambulatory Visit (HOSPITAL_COMMUNITY)
Admission: RE | Admit: 2015-11-14 | Discharge: 2015-11-14 | Disposition: A | Discharge status: Home / Self Care | Payer: 59 | Source: Ambulatory Visit | Attending: Obstetrics and Gynecology | Admitting: Obstetrics and Gynecology

## 2015-11-14 ENCOUNTER — Other Ambulatory Visit: Payer: Self-pay | Admitting: Obstetrics and Gynecology

## 2015-11-14 DIAGNOSIS — Z1151 Encounter for screening for human papillomavirus (HPV): Secondary | ICD-10-CM | POA: Insufficient documentation

## 2015-11-14 DIAGNOSIS — Z01419 Encounter for gynecological examination (general) (routine) without abnormal findings: Secondary | ICD-10-CM | POA: Insufficient documentation

## 2015-11-15 LAB — CYTOLOGY - PAP

## 2016-11-26 ENCOUNTER — Encounter (HOSPITAL_COMMUNITY): Payer: Self-pay | Admitting: *Deleted

## 2016-11-26 ENCOUNTER — Emergency Department (HOSPITAL_COMMUNITY): Payer: PRIVATE HEALTH INSURANCE

## 2016-11-26 ENCOUNTER — Emergency Department (HOSPITAL_COMMUNITY)
Admission: EM | Admit: 2016-11-26 | Discharge: 2016-11-26 | Disposition: A | Discharge status: Home / Self Care | Payer: PRIVATE HEALTH INSURANCE | Attending: Emergency Medicine | Admitting: Emergency Medicine

## 2016-11-26 DIAGNOSIS — R079 Chest pain, unspecified: Secondary | ICD-10-CM | POA: Diagnosis present

## 2016-11-26 DIAGNOSIS — R42 Dizziness and giddiness: Secondary | ICD-10-CM | POA: Diagnosis not present

## 2016-11-26 DIAGNOSIS — R0789 Other chest pain: Secondary | ICD-10-CM | POA: Insufficient documentation

## 2016-11-26 DIAGNOSIS — Z87891 Personal history of nicotine dependence: Secondary | ICD-10-CM | POA: Diagnosis not present

## 2016-11-26 HISTORY — DX: Sciatica, unspecified side: M54.30

## 2016-11-26 LAB — CBC WITH DIFFERENTIAL/PLATELET
BASOS ABS: 0.1 10*3/uL (ref 0.0–0.1)
Basophils Relative: 1 %
EOS ABS: 0.1 10*3/uL (ref 0.0–0.7)
Eosinophils Relative: 2 %
HCT: 35.4 % — ABNORMAL LOW (ref 36.0–46.0)
Hemoglobin: 11.2 g/dL — ABNORMAL LOW (ref 12.0–15.0)
LYMPHS PCT: 42 %
Lymphs Abs: 2.6 10*3/uL (ref 0.7–4.0)
MCH: 23.1 pg — ABNORMAL LOW (ref 26.0–34.0)
MCHC: 31.6 g/dL (ref 30.0–36.0)
MCV: 73 fL — ABNORMAL LOW (ref 78.0–100.0)
Monocytes Absolute: 0.5 10*3/uL (ref 0.1–1.0)
Monocytes Relative: 8 %
NEUTROS PCT: 47 %
Neutro Abs: 2.9 10*3/uL (ref 1.7–7.7)
PLATELETS: 291 10*3/uL (ref 150–400)
RBC: 4.85 MIL/uL (ref 3.87–5.11)
RDW: 14.6 % (ref 11.5–15.5)
WBC: 6.2 10*3/uL (ref 4.0–10.5)

## 2016-11-26 LAB — COMPREHENSIVE METABOLIC PANEL
ALBUMIN: 3.5 g/dL (ref 3.5–5.0)
ALT: 23 U/L (ref 14–54)
AST: 21 U/L (ref 15–41)
Alkaline Phosphatase: 81 U/L (ref 38–126)
Anion gap: 6 (ref 5–15)
BUN: 10 mg/dL (ref 6–20)
CHLORIDE: 107 mmol/L (ref 101–111)
CO2: 23 mmol/L (ref 22–32)
CREATININE: 0.9 mg/dL (ref 0.44–1.00)
Calcium: 9.5 mg/dL (ref 8.9–10.3)
GFR calc non Af Amer: >60 mL/min (ref >60)
Glucose, Bld: 97 mg/dL (ref 65–99)
Potassium: 3.9 mmol/L (ref 3.5–5.1)
SODIUM: 136 mmol/L (ref 135–145)
Total Bilirubin: 0.8 mg/dL (ref 0.3–1.2)
Total Protein: 6.5 g/dL (ref 6.5–8.1)

## 2016-11-26 LAB — I-STAT TROPONIN, ED
TROPONIN I, POC: 0 ng/mL (ref 0.00–0.08)
Troponin i, poc: 0 ng/mL (ref 0.00–0.08)

## 2016-11-26 NOTE — ED Triage Notes (Signed)
Pt reports mid chest discomfort that woke her up last night. Now has left side chest tightness. No resp distress is noted. ekg done at triage.

## 2016-11-26 NOTE — ED Notes (Signed)
Pt ambulated to bathroom no distress noted, steady gait.

## 2016-11-26 NOTE — ED Provider Notes (Signed)
[facility]-EMERGENCY DEPT Provider Note   CSN: [id number] Arrival date & time: 11/26/16  4098     History   Chief Complaint Chief Complaint  Patient presents with  . Chest Pain    HPI Sherri Chavez is a 41 y.o. female with no significant past medical history who presents today with chest pain that woke her up out of her sleep. She reports that her chest feels tight, is a 5 out of 10 with intermittent twinges of pain that feels sharp and are 7 out of 10.  She had one episode of shortness of breath when she was awakened with this pain. Her pain radiates to her left neck and her left shoulder. She denies diaphoresis, nausea, vomiting, recent fevers/illness, no cough. She has not had any recent travel, no personal or family history of DVTs/PEs. Denies leg swelling, recent surgery.    She has been under recent stress with increased activity as she has been packing up to move to [place], is flying to [place] tomorrow and other life stressors.   HPI  Past Medical History:  Diagnosis Date  . Sciatica     There are no active problems to display for this patient.   History reviewed. No pertinent surgical history.  OB History    No data available       Home Medications    Prior to Admission medications   Not on File    Family History History reviewed. No pertinent family history.  Social History Social History  Substance Use Topics  . Smoking status: Current Some Day Smoker  . Smokeless tobacco: Not on file  . Alcohol use Yes     Comment: occ     Allergies   Codeine   Review of Systems Review of Systems  Constitutional: Negative for activity change, appetite change, diaphoresis, fatigue, fever and unexpected weight change.  HENT: Negative for congestion, rhinorrhea, sore throat and trouble swallowing.   Eyes: Negative for visual disturbance.  Respiratory: Positive for chest tightness and shortness of breath (One episode earlier). Negative for cough and wheezing.    Cardiovascular: Positive for chest pain. Negative for palpitations and leg swelling.  Gastrointestinal: Negative for abdominal pain, diarrhea, nausea and vomiting.  Genitourinary: Negative for decreased urine volume, difficulty urinating, frequency, hematuria and pelvic pain.  Musculoskeletal: Negative for arthralgias, joint swelling, myalgias, neck pain and neck stiffness.  Skin: Negative for color change, pallor and rash.  Neurological: Positive for light-headedness. Negative for seizures, speech difficulty and headaches.  Hematological: Does not bruise/bleed easily.     Physical Exam Updated Vital Signs BP 98/64   Pulse (!) 52   Temp 98.3 F (36.8 C) (Oral)   Resp 10   SpO2 99%   Physical Exam  Constitutional: Vital signs are normal. She appears well-developed and well-nourished. She is cooperative. She does not appear ill.  HENT:  Head: Normocephalic and atraumatic.  Eyes: Conjunctivae are normal. Right eye exhibits no discharge. Left eye exhibits no discharge. No scleral icterus.  Neck: Normal range of motion. No JVD present.  Cardiovascular: Normal rate, regular rhythm, S1 normal, S2 normal, normal heart sounds, intact distal pulses and normal pulses.  Exam reveals no gallop and no friction rub.   No murmur heard. Pulmonary/Chest: Effort normal and breath sounds normal. No stridor. No respiratory distress. She has no decreased breath sounds. She has no wheezes. She has no rhonchi. She has no rales. She exhibits tenderness. She exhibits no crepitus, no edema and no swelling.  Tenderness with palpation of left-sided sternal/intercostal joints.  Abdominal: Soft. She exhibits no distension. There is no tenderness. There is no guarding.  Musculoskeletal: She exhibits no edema or deformity.  Neurological: She is alert. She exhibits normal muscle tone.  Skin: Skin is warm and dry. She is not diaphoretic.  Psychiatric: She has a normal mood and affect. Her behavior is normal.    Nursing note and vitals reviewed.    ED Treatments / Results  Labs (all labs ordered are listed, but only abnormal results are displayed) Labs Reviewed  CBC WITH DIFFERENTIAL/PLATELET - Abnormal; Notable for the following:       Result Value   Hemoglobin 11.2 (*)    HCT 35.4 (*)    MCV 73.0 (*)    MCH 23.1 (*)    All other components within normal limits  COMPREHENSIVE METABOLIC PANEL  I-STAT TROPOININ, ED  I-STAT TROPOININ, ED    EKG  EKG Interpretation  Date/Time:  Wednesday Nov 26 2016 07:47:55 EDT Ventricular Rate:  61 PR Interval:  168 QRS Duration: 84 QT Interval:  390 QTC Calculation: 392 R Axis:   70 Text Interpretation:  Normal sinus rhythm Low voltage QRS Borderline ECG No significant change since last tracing Confirmed by YAO  MD, DAVID ([id number]) on 11/26/2016 9:29:34 AM       Radiology Dg Chest 2 View  Result Date: 11/26/2016 CLINICAL DATA:  Left chest pain EXAM: CHEST  2 VIEW COMPARISON:  11/17/2013 FINDINGS: Normal heart size and mediastinal contours. No acute infiltrate or edema. No effusion or pneumothorax. No acute osseous findings. EKG leads create artifact over the chest. IMPRESSION: Negative chest Electronically Signed   By: Marnee SpringJonathon  Watts M.D.   On: 11/26/2016 08:31    Procedures Procedures (including critical care time)  Medications Ordered in ED Medications - No data to display   Initial Impression / Assessment and Plan / ED Course  I have reviewed the triage vital signs and the nursing notes.  Pertinent labs & imaging results that were available during my care of the patient were reviewed by me and considered in my medical decision making (see chart for details).  Clinical Course as of Nov 26 1117  Wed Nov 26, 2016  1105 Patient noted to be bradycardic (50s) with lower bp (98/54).  She reports that is her normal baseline BP and HR.   [EH]    Clinical Course User Index [EH] Cristina GongHammond, Elizabeth W, PA-C    Patient is to be discharged  with recommendation to follow up with PCP in regards to today's hospital visit. Chest pain is not likely of cardiac or pulmonary etiology d/t presentation, PERC and wells DVT negative, VSS, no tracheal deviation, no JVD or new murmur, RRR, breath sounds equal bilaterally, EKG without acute abnormalities, negative troponin, and negative CXR. Pt has been advised to return to the ED if CP becomes exertional, associated with diaphoresis or nausea, radiates to left jaw/arm, worsens or becomes concerning in any way. Chest pain is most likely musculoskeletal in nature, have provided instructions for OTC pain relief.  Pt appears reliable for follow up and is agreeable to discharge.   Case has been discussed with and seen by Dr. Silverio Layyao who agrees with the above plan to discharge.    Final Clinical Impressions(s) / ED Diagnoses   Final diagnoses:  Chest pain, unspecified type  Chest wall pain    New Prescriptions New Prescriptions   No medications on file     Cristina GongHammond, Elizabeth W,  PA-C 11/26/16 1119    Charlynne Pander, MD 11/27/16 [phone]

## 2016-11-26 NOTE — Discharge Instructions (Signed)
Please take ibuprofen and Tylenol to relieve her pain.  You may take up to 800 MG (4 pills) of normal strength ibuprofen every 8 hours as needed.  In between doses of ibuprofen you make take tylenol, up to 1,000 mg (two extra strength pills).  Do not take more than 3,000 mg tylenol in a 24 hour period.    Today all of your labs were unremarkable with the exception of you are slightly anemic. This at this time does not appear enough to cause your symptoms. Please begin taking a multivitamin (make sure it has iron in it, be advised most gummy vitamins do not) and follow-up with your PCP regarding both year chest pain and your anemia.

## 2018-04-13 IMAGING — DX DG CHEST 2V
2 series · 2 of 2 positions shown · non-contrast
Comparison: 11/17/2013

CLINICAL DATA: Left chest pain

EXAM:
CHEST  2 VIEW

[chest pa]
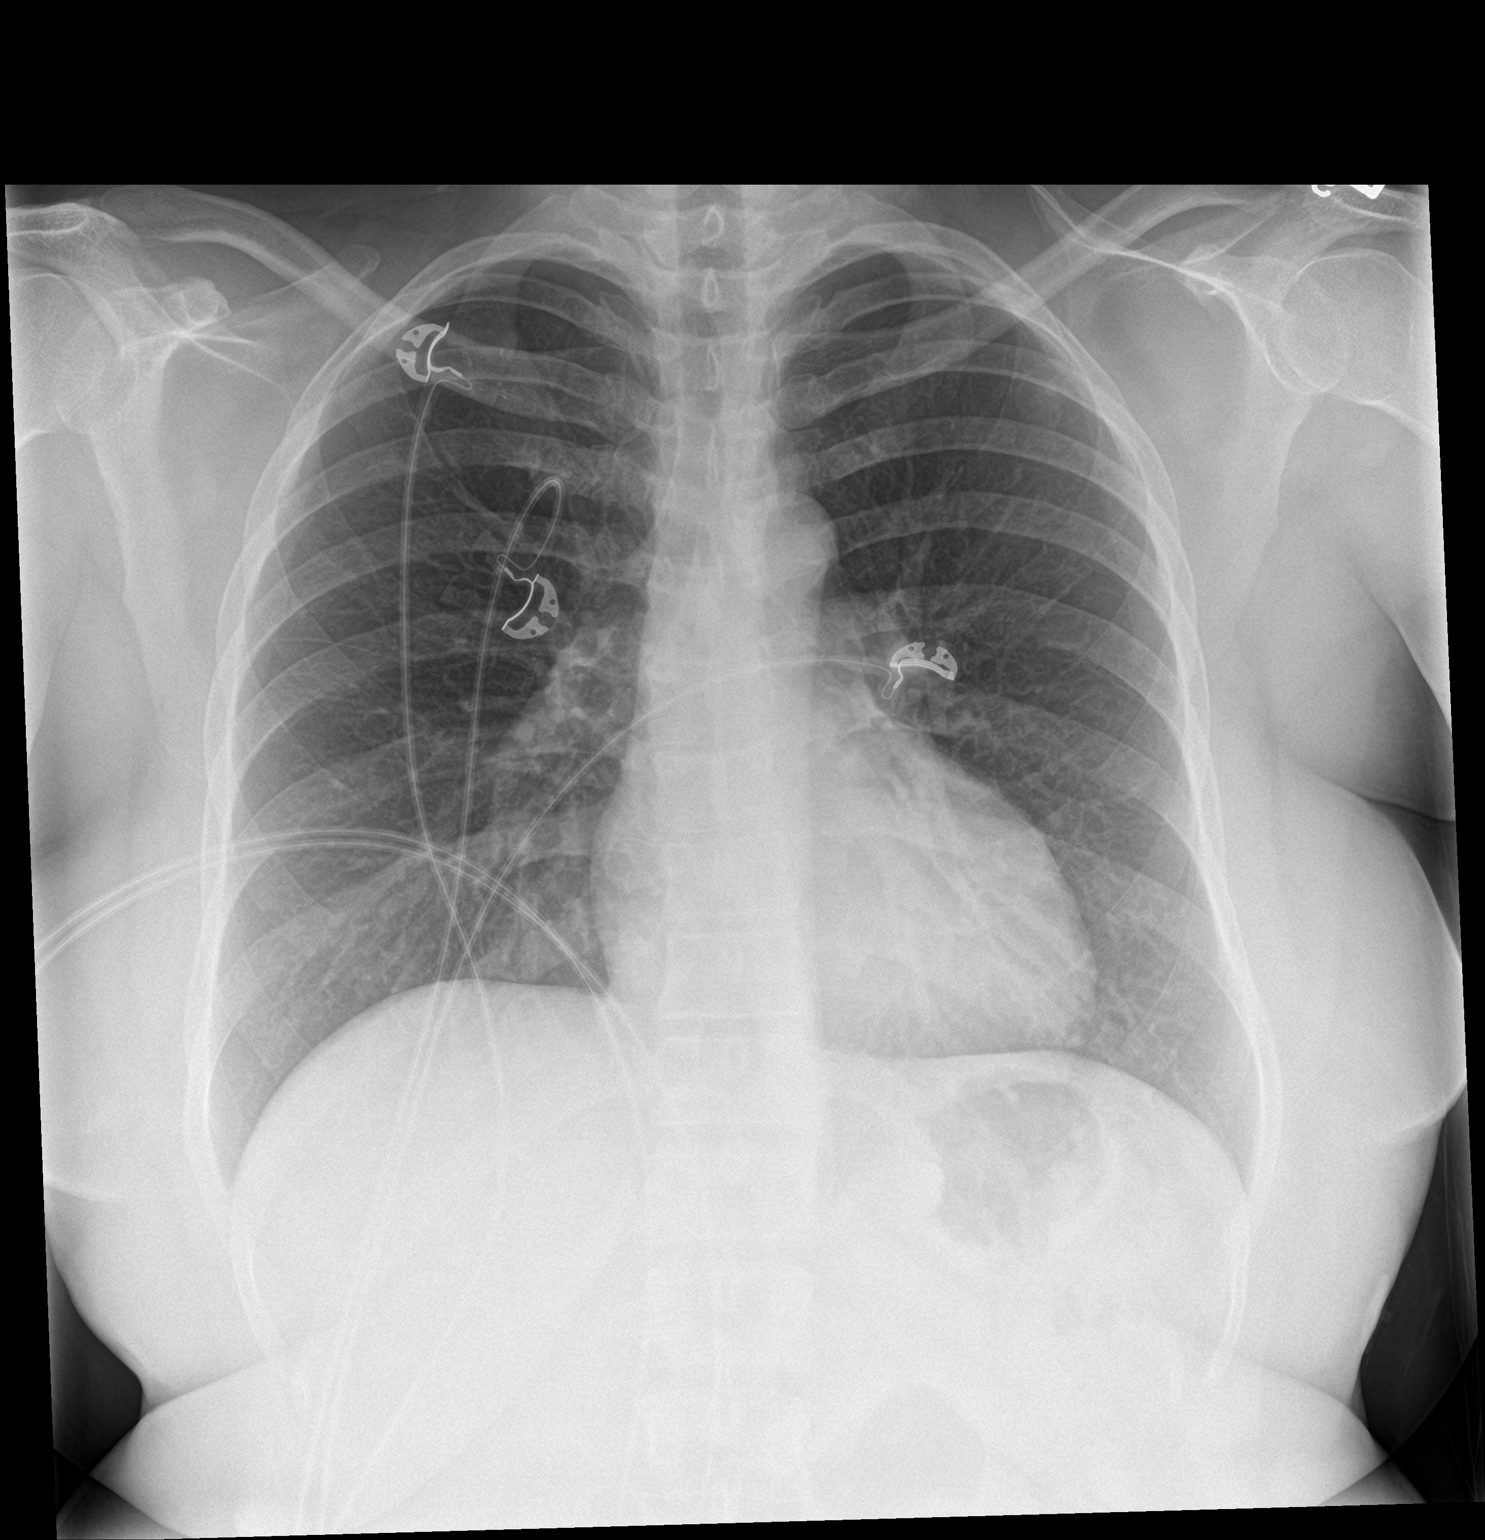

[chest lat]
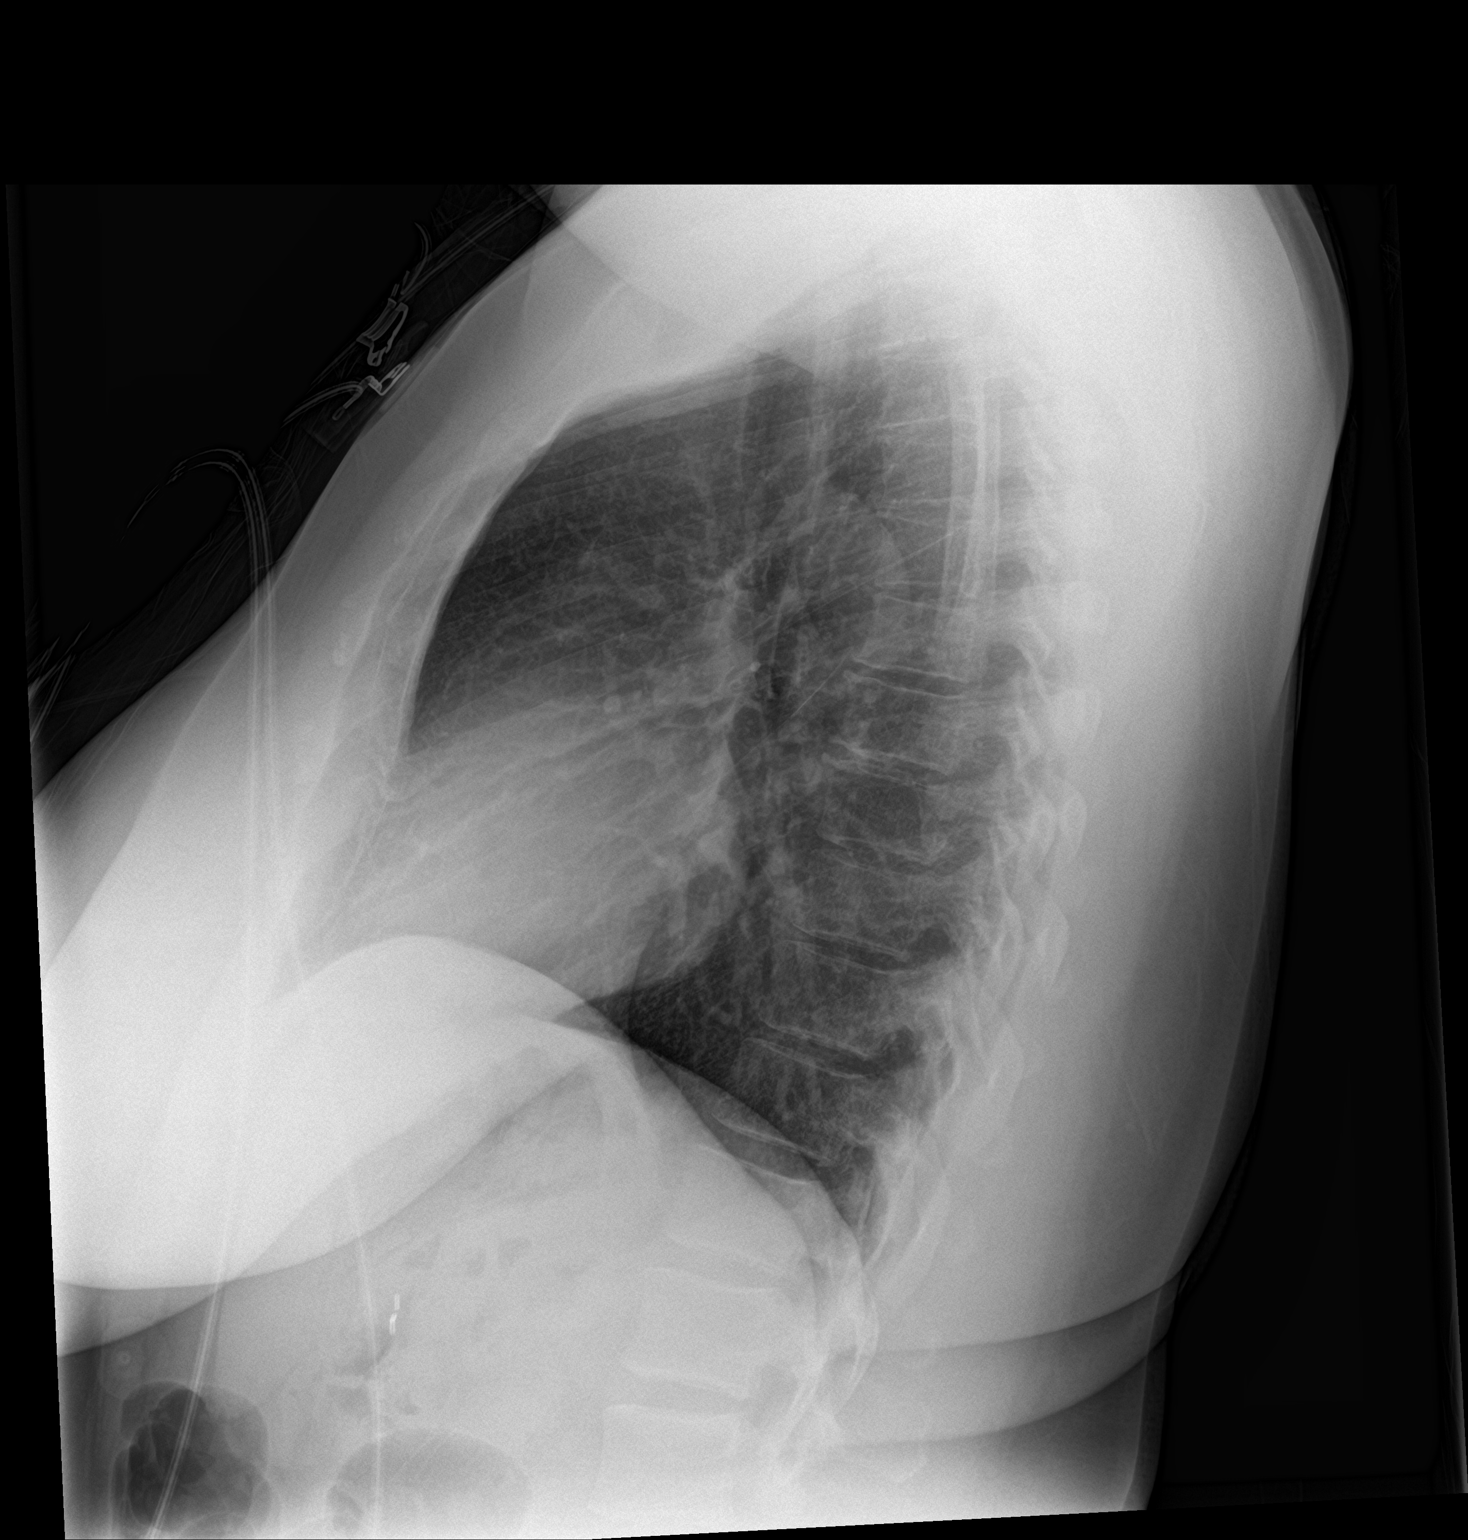

[2 of 2 positions shown; findings below may reference images not displayed]

FINDINGS: Normal heart size and mediastinal contours. No acute infiltrate or
edema. No effusion or pneumothorax. No acute osseous findings.

EKG leads create artifact over the chest.
IMPRESSION: Negative chest

## 2019-05-03 ENCOUNTER — Encounter: Attending: Internal Medicine | Primary: Internal Medicine

## 2019-05-06 ENCOUNTER — Inpatient Hospital Stay: Admit: 2019-05-06 | Discharge: 2019-05-06 | Payer: PRIVATE HEALTH INSURANCE | Primary: Internal Medicine

## 2019-05-06 DIAGNOSIS — M25511 Pain in right shoulder: Secondary | ICD-10-CM

## 2019-05-06 NOTE — Consults (Signed)
[place]  Phone: [phone]  Fax: [phone]         Physical Therapy Upper Extremity Evaluation    Date:  05/06/2019  Patient: Monica Wood  DOB: 1976/02/04  MRN: [id number]  Physician: Megan Salon, MD   Insurance: [employer]  Medical Diagnosis: Pain in right shoulder   Rehab Codes: M25.511  Onset Date: 2019/04/25  Next Dr.'s appt.: --    Subjective:   CC/HPI: Pt reports to PT with R shoulder pain. Pt states that she is currently having problems when trying to sleep, stating that she is unable to do this on her R side d/t pain. Pt also reports trouble with opening heavy doors, holding heavier objects with palm up, IR at 90-90. Pt reports that she had previously had problems with her shoulder in March,as well as an injection in August which she reports helped. Pt notes that she is unsure of a specific cause of her increased pain, unable to report any traumatic event that led to increased pain. Pt reports occasional numbness/tingling in R hand.    PMHx:     _0  Unremarkable             _1  Refer to full medical chart  In EPIC     Tests: _2  X-Ray:   _3  MRI:   _4  Other:      Medications: _5  Refer to full medical record _6  None _7  Other:  Allergies:      _8  Refer to full medical record _9  None _10  Other:    Function:  Hand Dominance  _11  Right  _12  Left  Marital Status    Home type    Stairs from outside    Stairs inside    [employer]   Job status Employed   Work Activities/duties  Desk work   Recreational Activities --       Pain present? Yes   Location R shoulder   Pain Rating currently 4-5/10   Pain at worse 10/10   Pain at best 4/10   Description of pain Stabbing   Altered Sensation Reports some numbness and tingling in hand at night   What makes it worse Laying down, trying to lift things, bending arm back   What makes it better Rest, elevation, ice, heat   Symptom progression Gotten worse   Sleep Sleep affected by  shoulder, unable to lay on R side              Objective:      STRENGTH    Left Right   C5 Shld Abd 5/5 4-/5   Shld Flexion 5/5 3+/5, pain   Shld IR 5/5 4+/5   Shld ER 4+/5 3+/5, pain   Shld HAB     Shld Ext     C6 Elb Flex     C7 Elb Ext     C8 EPL     T1 Fing Abd     Grip               Prone:     Retraction     Depression     IR     Abd     Scaption     Flexion          - Pt compensates with UT with overhead motion            AROM PROM    Left Right Left Right  Shld Abd WNL WNL     Shld Flex WNL WNL     Shld IR T3 T4     Shld ER T4 T5     Shld HAB                     Elbow Flex       Elbow Ext       Supination       Pronation              Wrist flex        Wrist ext       Wrist UD       Wrist RD                          ROM   Cervical    Flexion WNL   Extension WNL   Rotation L- WNL R- WNL   Sidebend L- WNL R- WNL   Retraction          TESTS (+/-) LEFT RIGHT Not Tested   Vertebral A   _0    CRLF       Speeds  + _1    Neers   _2    Hawkins  + _3    Empty Can  + _4    Drop Arm   _5    Post Apprehension   _6    Ant Apprehension    _7    Clunk   _8    Sulcus   _9    Elbow varus/valgus   _10       _11       _12       _13      OBSERVATION No Deficit Deficit Not Tested Comments   Posture       Forward Head _14  _15  _16     Rounded Shoulders _17  _18  _19     Kyphosis _20  _21  _22     Lordosis _23  _24  _25     Lateral Shift _26  _27  _28     Scoliosis _29  _30  _31     Iliac Crest _32  _33  _34     PSIS _35  _36  _37     ASIS _38  _39  _40     Genu Valgus _41  _42  _43     Genu Varus _44  _45  _46     Genu Recurvatum _47  _48  _49     Pronation _50  _51  _52     Supination _53  _54  _55     Leg Length Discrp _56  _57  _58     Slumped Sitting _59  _60  _61     Palpation _62  _63  _64  Tenderness to palpation along R proximal bicep, R lateral arm spanning from shoulder to elbow   Sensation _65  _66  _67     Edema _68  _69  _70     Neurological _71  _72  _73         Somatic Dysfunctions Normal Deficit Details   Cervical   _74  _75     Thoracic   _76  _77  [facility] T3-6   Rib   _78  _79  R posterior ribs 3-6   Pelvis   _80  _81     Lumbar _82  _83     SI   _84  _85        Flexibility Normal LUE Deficit RUE Deficit   UTrap _86  _87  _88    L.Scap _89  _90  _91    Scalenes  _92  _93  _94    Pec Major _95  _96  _97    Pec Minor _98  _99  _100    Lats _101  _102  _103    Supinators _104  _105  _106    Pronators _107  _108  _109    Other:  FUNCTION Normal Difficult Unable   Sitting _0  _1  _2    Standing _3  _4  _5    Ambulation _6  _7  _8    Groom/Dress _9  _10  _11    Lift/Carry _12  _13  _14    Stairs _15  _16  _17    Bending _18  _19  _20    OH reach _21  _22  _23    Sit to Stand _24  _25  _26          Assessment: Pt reports to PT with increased pain with functional use of R arm, poor cervical and shoulder flexibility, and poor scapular stabilizer strength leading to poor posture and shoulder mechanics with motion. Pt would benefit from PT in order to help improve scapular stabilizer strength and flexibility in cervical spine and R shoulder to help normalize shoulder mechanics, reduce pain, improve functional use of arm, and allow for better sleep.     STG: (to be met in 6 treatments)  1. ? Pain: Pt to decrease pain levels to 5/10 with all activities to ease ADLs.  2. ? ROM: Increase flexibility in cervical spine and R shoulder to improve alignment for better motion mechanics and to help decrease pain.  3. Pt will self-report ability to sleep without being woken up by R shoulder pain, allowing pt to be better rested.  4. Independent with Home Exercise Programs    LTG: (to be met in 12 treatments)  1. Improve score of UEFS from 45% impaired to <20% impaired to allow for better ability to use R arm without pain.  2. ? Strength: Pt will demonstrate 5/5 scapular stabilizer strength, allowing for better posture and R arm mechanics with overhead motion.  3. ? Function: Pt will be able to demonstrate ability to lift R arm overhead with no compensations with UT.  4. Decrease pain to 0/10    Patient goals: "Healing and info on how to prevent injury in future"    Rehab Potential:  _27  Good  _28  Fair  _29  Poor   Suggested Professional Referral:  _30  No   _31  Yes:  Barriers to Goal Achievement::  _32  No  _33  Yes:  Domestic Concerns:  _34  No  _35  Yes:    Pt. Education:  _36  Plans/Goals, Risks/Benefits discussed  _37  Home exercise program  Method of Education: _38  Verbal  _39  Demo  _40  Written  Comprehension of Education:  _41  Verbalizes understanding.  _42  Demonstrates understanding.  _43  Needs Review.  _44  Demonstrates/verbalizes understanding of HEP/Ed previously given.    Treatment Plan:  _45  Therapeutic Exercise    _46  Modalities:  _47  Therapeutic Activity    _48  Ultrasound  _49  Electrical Stimulation  _50  Gait Training     _51  Lumbar/Cervical Traction  _52  Neuromuscular Re-education _53  Cold/hotpack _54  Iontophoresis: 4 mg/mL  _55  Instruction in HEP              Dexamethasone Sodium Phosphate 40-80 mAmin  _56  Manual Therapy             _57   Aquatic Therapy       _58  Vasocompression: [place]  _59  Other:    _60   Medication allergies reviewed for use of    Dexamethasone Sodium Phosphate 84m/ml     with iontophoresis treatments.   Pt is not allergic.    Frequency:  2 x/week for 12 visits      Today???s Treatment:       Exercises:  Exercise  R Shoulder Reps/ Time Weight/ Level Comments   UT S 3x30"     Levator S 3x30"     Doorway  pec S 3x30"     Doorway Rhomboid S 3x30"                                                           Other:        Somatic Dysfunctions Normal Deficit Details   Cervical   _0  _1     Thoracic   _2  _3  FRSR T3-6- MOB   Rib   _4  _5  R posterior ribs 3-6- MOB7   Pelvis   _6  _7     Lumbar _8  _9     SI   _10  _11         Specific Instructions for next treatment: Continue tx per POC    Evaluation Complexity:  History (Personal factors, comorbidities) _12  0 _13  1-2 _14  3+   Exam (limitations, restrictions) _15  1-2 _16  3 _17  4+   Clinical presentation (progression) _18  Stable _19  Evolving  _20  Unstable   Decision Making _21  Low _22  Moderate _23  High    _24  Low Complexity _25  Moderate Complexity _26  High Complexity       Treatment Charges: Mins Units   _27  Evaluation       _28   Low        _29   Moderate       _30   High 28 1   _31   Modalities     _32   Ther Exercise 13 1   _33   Manual Therapy 2 0   _34   Ther Activities     _35   Aquatics     _36   Vasocompression     _37   Other       TOTAL TREATMENT TIME: 43    Time in: 0732      Time out: 0834    Electronically signed by: Deeann Dowse, PT        Physician Signature:________________________________Date:__________________  By signing above or cosigning this note, I have reviewed this plan of care and certify a need for medically necessary rehabilitation services.     *PLEASE SIGN ABOVE AND FAX BACK ALL PAGES*

## 2019-05-10 ENCOUNTER — Inpatient Hospital Stay: Admit: 2019-05-10 | Discharge: 2019-05-10 | Payer: PRIVATE HEALTH INSURANCE | Primary: Internal Medicine

## 2019-05-10 NOTE — Other (Signed)
[]  [place]  [address]  [phone]  F: [phone] []  [place]  [address]   [address]  P: [phone]  F: [phone] []  [place]  [place]  P: [phone]  F: [phone] []  [place]  P: [phone]  F: [phone] []  [place]  [place]   [address]   [place]: [phone]  F: [phone]      Physical Therapy Daily Treatment Note    Date:  05/10/2019  Patient: Monica Wood                        DOB: October 02, 1975                      MRN: [id number]  Physician: Megan Salon, MD                          Insurance: [employer]  Medical Diagnosis: Pain in right shoulder                Rehab Codes: M25.511  Onset Date: 2019-04-21               Next Dr.'s appt.: --  Visit# / total visits: 2/12; Progress note for Medicare patient due at visit      Cancels/No Shows:     Subjective:  Pt arrived reporting R shoulder is sore and achy. States she gave blood yesterday so elbow and bicep are bothering her today.   Pain:  [x]  Yes  []  No Location: R shoulder Pain Rating: (0-10 scale) 1-2/10  Pain altered Tx:  []  No  []  Yes  Action:  Comments:    Today???s Treatment:                                        Exercises:  Exercise  R Shoulder Reps/ Time Weight/ Level Comments   UBE 6' L9    UT S 3x30" ?? ??HEP   Levator S 3x30" ?? ??HEP   Doorway pec S 3x30" ?? ??HEP   Doorway Rhomboid S 3x30" ?? ??HEP   ?? ?? ?? ??   Prone scap retraction 15x5"?? ?? ??HEP   ??prone scap depression ??15x5" ?? ??HEP   ??SA punches ??15x ?? ??HEP   ??TB rows 15x lime?? ??HEP   ??TB shoulder extension ??15x ??lime ??HEP   ?? ?? ?? ??   ?? ?? ?? ??   ?? ?? ?? ??   Other:        Treatment Charges: Mins Units   []   Modalities     [x]   Ther Exercise 30 2   []    Manual Therapy     []   Ther Activities     []   Aquatics     []   Vasocompression     []   Other     Total Treatment time 30 2       Assessment: [x]  Progressing toward goals. Initiated treatment on UBE for warm  up followed by stretches and exercises per chart with fair tolerance. Pt needing VC's throughout treatment for slow controlled movements due to pt pushing into painful ranges, as well as decreased upper trap compensation with good carryover. Issued new exercises for HEP along with band. Assess response to treatment and progress as tol.     []  No change.     []  Other:  []  Patient would continue to benefit from skilled physical therapy services in order to:     STG: (to be met in 6 treatments)  1. ? Pain: Pt to decrease pain levels to 5/10 with all activities to ease ADLs.  2. ? ROM: Increase flexibility in cervical spine and R shoulder to improve alignment for better motion mechanics and to help decrease pain.  3. Pt will self-report ability to sleep without being woken up by R shoulder pain, allowing pt to be better rested.  4. Independent with Home Exercise Programs  ??  LTG: (to be met in 12 treatments)  1. Improve score of UEFS from 45% impaired to <20% impaired to allow for better ability to use R arm without pain.  2. ? Strength: Pt will demonstrate 5/5 scapular stabilizer strength, allowing for better posture and R arm mechanics with overhead motion.  3. ? Function: Pt will be able to demonstrate ability to lift R arm overhead with no compensations with UT.  4. Decrease pain to 0/10  ??  Patient goals: "Healing and info on how to prevent injury in future"    Pt. Education:  [x]  Yes  []  No  [x]  Reviewed Prior HEP/Ed  Method of Education: [x]  Verbal  []  Demo  [x]  Written  Comprehension of Education:  [x]  Verbalizes understanding.  [x]  Demonstrates understanding.  []  Needs review.  []  Demonstrates/verbalizes HEP/Ed previously given.     Plan: [x]  Continue current frequency toward long and short term goals.     []  Specific Instructions for subsequent treatments:       Time In:11:00 am            Time Out: 11:40 am    Electronically signed by:  Elta Guadeloupe, PTA

## 2019-05-13 ENCOUNTER — Inpatient Hospital Stay: Admit: 2019-05-13 | Discharge: 2019-05-13 | Payer: PRIVATE HEALTH INSURANCE | Primary: Internal Medicine

## 2019-05-13 NOTE — Other (Signed)
[]  [place]  [address]  [phone]  F: [phone] []  [place]  [address]   [address]  P: [phone]  F: [phone] [x]  [place]  [place]  P: [phone]  F: [phone] []  [place]: [phone]  F: [phone] []  [place]  [place]   [address]   [place]: [phone]  F: [phone]      Physical Therapy Daily Treatment Note    Date:  05/13/2019  Patient: Monica Wood                        DOB: [birth year]                      MRN: [id number]  Physician: Megan Salon, MD                          Insurance: [employer]  Medical Diagnosis: Pain in right shoulder                Rehab Codes: M25.511  Onset Date: 04/30/19               Next Dr.'s appt.: --  Visit# / total visits: 3/12; Progress note for Medicare patient due at visit      Cancels/No Shows:     Subjective:  Pt reporting just a "twing" of shoulder pain. States she feels it is feeling better after last visit.    Pain:  [x]  Yes  []  No Location: R shoulder Pain Rating: (0-10 scale) 1/10  Pain altered Tx:  []  No  []  Yes  Action:  Comments:    Today???s Treatment:                                        Exercises:  Exercise  R Shoulder Reps/ Time Weight/ Level Comments   UBE 6' L9    UT S 3x30" ?? ??HEP   Levator S 3x30" ?? ??HEP   Doorway pec S 3x30" ?? ??HEP   Doorway Rhomboid S 3x30" ?? ??HEP   ?? ?? ?? ??   Prone scap retraction 15x5"?? ?? ??HEP   ??prone scap depression ??15x5" ?? ??HEP   ??SA punches ??15x ?? ??HEP   ??TB rows 15x lime?? ??HEP   ??TB shoulder extension ??15x lime ??HEP   ??prone ITY's 10x?? ?? ??HEP   ??HAB on wall ??2x10 lime?? ??   Sleeper stretch 3x30"?? ?? ??HEP   Other:        Treatment Charges: Mins Units   []   Modalities      [x]   Ther Exercise 35 2   []   Manual Therapy     []   Ther Activities     []   Aquatics     []   Vasocompression     []   Other     Total Treatment time 35 2       Assessment: [x]  Progressing toward goals. Progressed pt with adding in  prone ITY's, HAB on wall, and sleeper stretch with fair tolerance. Pt noting some pain and "catching during prone flexion but able to complete exercise. Pt needing VC's for decreased upper trap compensation with rows and shoulder extension with good carryover. Issued updated exercises for HEP. Continue to progress as tol.      []  No change.     []  Other:  []  Patient would continue to benefit from skilled physical therapy services in order to:     STG: (to be met in 6 treatments)  1. ? Pain: Pt to decrease pain levels to 5/10 with all activities to ease ADLs.  2. ? ROM: Increase flexibility in cervical spine and R shoulder to improve alignment for better motion mechanics and to help decrease pain.  3. Pt will self-report ability to sleep without being woken up by R shoulder pain, allowing pt to be better rested.  4. Independent with Home Exercise Programs  ??  LTG: (to be met in 12 treatments)  1. Improve score of UEFS from 45% impaired to <20% impaired to allow for better ability to use R arm without pain.  2. ? Strength: Pt will demonstrate 5/5 scapular stabilizer strength, allowing for better posture and R arm mechanics with overhead motion.  3. ? Function: Pt will be able to demonstrate ability to lift R arm overhead with no compensations with UT.  4. Decrease pain to 0/10  ??  Patient goals: "Healing and info on how to prevent injury in future"    Pt. Education:  [x]  Yes  []  No  [x]  Reviewed Prior HEP/Ed  Method of Education: [x]  Verbal  []  Demo  [x]  Written  Comprehension of Education:  [x]  Verbalizes understanding.  [x]  Demonstrates understanding.  []  Needs review.  []  Demonstrates/verbalizes HEP/Ed previously given.     Plan: [x]  Continue current frequency toward long and short term  goals.    []  Specific Instructions for subsequent treatments:       Time In: 8:00 am            Time Out:  8:40 am    Electronically signed by:  Elta Guadeloupe, PTA

## 2019-05-16 ENCOUNTER — Inpatient Hospital Stay: Admit: 2019-05-16 | Discharge: 2019-05-16 | Payer: PRIVATE HEALTH INSURANCE | Primary: Internal Medicine

## 2019-05-16 DIAGNOSIS — M25511 Pain in right shoulder: Secondary | ICD-10-CM

## 2019-05-16 NOTE — Other (Signed)
[]  [place]  [address]  [phone]  F: [phone] []  [place]  [address]   [address]  P: [phone]  F: [phone] [x]  [place]  [place]  P: [phone]  F: [phone] []  [place]: [phone]  F: [phone] []  [place]  [place]   [address]   [place]: [phone]  F: [phone]      Physical Therapy Daily Treatment Note    Date:  05/16/2019  Patient: Monica Wood                        DOB: October 21, 1975                      MRN: [id number]  Physician: Megan Salon, MD                          Insurance: [employer]  Medical Diagnosis: Pain in right shoulder                Rehab Codes: M25.511  Onset Date: 05/05/19               Next Dr.'s appt.: --  Visit# / total visits: 4/12; Progress note for Medicare patient due at visit      Cancels/No Shows:     Subjective:     Pain:  []  Yes  [x]  No Location: R shoulder Pain Rating: (0-10 scale) 0/10  Pain altered Tx:  []  No  []  Yes  Action:  Comments: Reports no pain at arrival, only has pain sleeping. Hard to find a comfortable position.    Today???s Treatment:                                        Exercises:  Exercise  R Shoulder Reps/ Time Weight/ Level Comments HEP   UBE 6' L9     UT S 3x30" ??  *   Levator S 3x30" ??  *   Doorway pec S 3x30" ??  *   Doorway Rhomboid S 3x30" ??  *   ?? ?? ??     Prone scap retraction 15x5"?? ??  *   ??prone scap depression ??15x5" ??  *   ??SA punches 2x10 ??  *   ??TB rows 2x10 lime??  *   ??TB shoulder extension 2x10 lime  *   ??prone ITY's 10x?? ??  *   ??HAB on wall ??2x10 lime??     Sleeper stretch 3x30"?? ??  *   tband ER w/ retraction 2x10 lime            SL ER 2x10      SL HAB 2x10             Other: Pec  Release        Treatment Charges: Mins Units   []   Modalities     [x]   Ther Exercise 40 3   [x]   Manual Therapy  5 nc   []   Ther Activities     []   Aquatics     []   Vasocompression     []   Other     Total Treatment time 45 3       Assessment: [x]  Progressing toward goals. Continued with stretches and ex per log above. Cues needed for decreased UT compensation and to keep ex within pain free ROM with good follow through. Addition of B ER w/ retraction and SL ex noted above for further scapular strengthening. Pt reports "catching" with SL HAB but able to complete all reps. Performed R pec release today with relief of tension noted post.     []  No change.     []  Other:  [x]  Patient would continue to benefit from skilled physical therapy services in order to:     STG: (to be met in 6 treatments)  1. ? Pain: Pt to decrease pain levels to 5/10 with all activities to ease ADLs.  2. ? ROM: Increase flexibility in cervical spine and R shoulder to improve alignment for better motion mechanics and to help decrease pain.  3. Pt will self-report ability to sleep without being woken up by R shoulder pain, allowing pt to be better rested.  4. Independent with Home Exercise Programs  ??  LTG: (to be met in 12 treatments)  1. Improve score of UEFS from 45% impaired to <20% impaired to allow for better ability to use R arm without pain.  2. ? Strength: Pt will demonstrate 5/5 scapular stabilizer strength, allowing for better posture and R arm mechanics with overhead motion.  3. ? Function: Pt will be able to demonstrate ability to lift R arm overhead with no compensations with UT.  4. Decrease pain to 0/10  ??  Patient goals: "Healing and info on how to prevent injury in future"    Pt. Education:  [x]  Yes  []  No  []  Reviewed Prior HEP/Ed  Method of Education: [x]  Verbal  []  Demo  []  Written  Comprehension of Education:  [x]  Verbalizes understanding.  [x]  Demonstrates understanding.  []  Needs review.  []  Demonstrates/verbalizes HEP/Ed  previously given.     Plan: [x]  Continue current frequency toward long and short term goals.    []  Specific Instructions for subsequent treatments:       Time In: 10:00 am            Time Out:  10:50 am    Electronically signed by:  Wells Guiles, PTA

## 2019-05-20 ENCOUNTER — Inpatient Hospital Stay: Admit: 2019-05-20 | Discharge: 2019-05-20 | Payer: PRIVATE HEALTH INSURANCE | Primary: Internal Medicine

## 2019-05-20 NOTE — Other (Signed)
[]  [facility] &  Therapy  [address]  [phone]  F: [phone] []  [place]  [address]   [address]  P: [phone]  F: [phone] [x]  [place]  [place]  P: [phone]  F: [phone] []  [place]: [phone]  F: [phone] []  [place]  [place]: [phone]  F: [phone]      Physical Therapy Daily Treatment Note    Date:  05/20/2019  Patient: Monica Wood                        DOB: October 14, 1975                      MRN: [id number]  Physician: Megan Salon, MD                          Insurance: [employer]  Medical Diagnosis: Pain in right shoulder                Rehab Codes: M25.511  Onset Date: 04/13/19               Next Dr.'s appt.: --  Visit# / total visits: 5/12  Cancels/No Shows: 0/0    Subjective: Pt reports to PT stating that she is having less pain in her shoulder at this time, however feels that she is tighter in her pec at this time.  Pain:  []  Yes  [x]  No  Location: R shoulder  Pain Rating: (0-10 scale) 4/10  Pain altered Tx:  [x]  No  []  Yes  Action:  Comments:     Today???s Treatment:                                        Exercises:  Exercise  R Shoulder Reps/ Time Weight/ Level Comments HEP   UBE 6' L9  x   UT S 3x30" ??  x   Levator S 3x30" ??  x   Doorway pec S 3x30" ??  x   Doorway Rhomboid S 3x30" ??            Prone: ?? ??     Retraction 2x15, 5"?? ??  x   Depression 2x15, 5" ??  x   W's 15x, 5"   x   ITY's 10x Lime            Wall push-up plus 2x12   x   ??SA punches 2x10 ??     ??TB rows 2x10 lime??     ??TB shoulder extension 2x10 lime     ??HAB on wall ??2x10 lime??     Sleeper stretch 3x30"?? ??     tband ER w/ retraction 2x10 lime            SL ER  2x10      SL HAB 2x10             Other: Hypervolt to R proximal  bicep, pec major        Treatment Charges: Mins Units   []   Modalities     [x]   Ther Exercise 42 2   [x]   Manual Therapy 8 1   []   Ther Activities     []   Aquatics     []   Vasocompression     []   Other     Total Treatment time 50 3       Assessment: [x]  Progressing toward goals. Continued per treatment above with pt tolerating all exercises well, denying any increased pain. Increased reps with mat exercises and added prone W's today with pt demonstrating good technique. Ended treatment with manual with Hypervolt to attempt to loosen up muscles with pt reporting improved mobility following completion. Pt reports no pain at end of today's treatment.    []  No change.     []  Other:  [x]  Patient would continue to benefit from skilled physical therapy services in order to:     STG: (to be met in 6 treatments)  1. ? Pain: Pt to decrease pain levels to 5/10 with all activities to ease ADLs.  2. ? ROM: Increase flexibility in cervical spine and R shoulder to improve alignment for better motion mechanics and to help decrease pain.  3. Pt will self-report ability to sleep without being woken up by R shoulder pain, allowing pt to be better rested.  4. Independent with Home Exercise Programs  ??  LTG: (to be met in 12 treatments)  1. Improve score of UEFS from 45% impaired to <20% impaired to allow for better ability to use R arm without pain.  2. ? Strength: Pt will demonstrate 5/5 scapular stabilizer strength, allowing for better posture and R arm mechanics with overhead motion.  3. ? Function: Pt will be able to demonstrate ability to lift R arm overhead with no compensations with UT.  4. Decrease pain to 0/10  ??  Patient goals: "Healing and info on how to prevent injury in future"    Pt. Education:  [x]  Yes  []  No  [x]  Reviewed Prior HEP/Ed  Method of Education: [x]  Verbal  [x]  Demo  []  Written  Comprehension of Education:  [x]  Verbalizes understanding.  [x]   Demonstrates understanding.  [x]  Needs review.  []  Demonstrates/verbalizes HEP/Ed previously given.     Plan: [x]  Continue current frequency toward long and short term goals.    [x]  Specific Instructions for subsequent treatments: Continue tx per POC      Time In: 1106            Time Out: 1203     Electronically signed by:  Deeann Dowse, PT

## 2019-05-23 ENCOUNTER — Inpatient Hospital Stay: Admit: 2019-05-23 | Discharge: 2019-05-23 | Payer: PRIVATE HEALTH INSURANCE | Primary: Internal Medicine

## 2019-05-23 NOTE — Other (Signed)
_0  [place]  [address]  [phone]  F: [phone] _1  [place]  [address]   [address]  P: [phone]  F: [phone] _2  [place]  [place]  P: [phone]  F: [phone] _3  [place]: [phone]  F: [phone] _4  [place]  [place]   [address]   [place]: [phone]  F: [phone]      Physical Therapy Daily Treatment Note    Date:  05/23/2019  Patient: Monica Wood                        DOB: 26-Oct-1975                      MRN: [id number]  Physician: Megan Salon, MD                          Insurance: [employer]  Medical Diagnosis: Pain in right shoulder                Rehab Codes: M25.511  Onset Date: 06-May-2019               Next Dr.'s appt.: --  Visit# / total visits: 6/12  Cancels/No Shows: 0/0    Subjective: Pt reports most pain at night when trying to get comfortable.  Pt notes min pain this morning.  Pain:  _5  Yes  _6  No  Location: R shoulder  Pain Rating: (0-10 scale) 1/10  Pain altered Tx:  _7  No  _8  Yes  Action:  Comments:     Today???s Treatment:                                        Exercises:  Exercise  R Shoulder Reps/ Time Weight/ Level Comments today   UBE 6' L9  x   UT S 3x30" ??  x   Levator S 3x30" ??  x   Doorway pec S 3x30" ??  x   Doorway Rhomboid S 3x30" ??            Prone: ?? ??     Retraction 2x15, 5"?? ??  x   Depression 2x15, 5" ??  x   W's 15x, 5"   x   ITY's 10x   x          Wall push-up plus 2x12   x   ??SA punches 2x10 ??     ??TB rows 2x10 lime??  x   ??TB shoulder extension 2x10 lime  x   ??HAB on wall ??2x10 lime??     Sleeper stretch 3x30"?? ??     tband ER w/ retraction 2x10 lime  x          SL ER 2x10   x   SL HAB 2x10   x          Other:  Hypervolt to R proximal bicep, pec major        Treatment  Charges: Mins Units   _0   Modalities     _1   Ther Exercise 45 3   _2   Manual Therapy 10 1   _3   Ther Activities     _4   Aquatics     _5   Vasocompression     _6   Other     Total Treatment time 55 4       Assessment: _7  Progressing toward goals. Continued per treatment above with pt tolerating all exercises well, VC to reduce UT compensation and for proper posture throughout.  Pt feels muscle soreness during and after tx, but not painful.  Hyper volt to R pec and prox bicep as previous, and to R UT, levator end of session.  Pt feels looser after tx and really feels hyper volt helps her.  Will cont to monitor and progress as tol.    _8  No change.     _9  Other:  _10  Patient would continue to benefit from skilled physical therapy services in order to:     STG: (to be met in 6 treatments)  1. ? Pain: Pt to decrease pain levels to 5/10 with all activities to ease ADLs.  2. ? ROM: Increase flexibility in cervical spine and R shoulder to improve alignment for better motion mechanics and to help decrease pain.  3. Pt will self-report ability to sleep without being woken up by R shoulder pain, allowing pt to be better rested.  4. Independent with Home Exercise Programs  ??  LTG: (to be met in 12 treatments)  1. Improve score of UEFS from 45% impaired to <20% impaired to allow for better ability to use R arm without pain.  2. ? Strength: Pt will demonstrate 5/5 scapular stabilizer strength, allowing for better posture and R arm mechanics with overhead motion.  3. ? Function: Pt will be able to demonstrate ability to lift R arm overhead with no compensations with UT.  4. Decrease pain to 0/10  ??  Patient goals: "Healing and info on how to prevent injury in future"    Pt. Education:  _11  Yes  _12  No  _13  Reviewed Prior HEP/Ed  Method of Education: _14  Verbal  _15  Demo  _16  Written  Comprehension of Education:  _17  Verbalizes understanding.  _18  Demonstrates  understanding.  _19  Needs review.  _20  Demonstrates/verbalizes HEP/Ed previously given.     Plan: _21  Continue current frequency toward long and short term goals.    _22  Specific Instructions for subsequent treatments: Continue tx per POC      Time In: 1005           Time Out: 1105    Electronically signed by:  Sunday Shams, PTA

## 2019-05-27 ENCOUNTER — Inpatient Hospital Stay: Admit: 2019-05-27 | Discharge: 2019-05-27 | Payer: PRIVATE HEALTH INSURANCE | Primary: Internal Medicine

## 2019-05-27 NOTE — Other (Signed)
[]  [facility] &  Therapy  [address]  [phone]  F: [phone] []  [place]  [address]   [address]  P: [phone]  F: [phone] [x]  [place]  [place]  P: [phone]  F: [phone] []  [place]: [phone]  F: [phone] []  [place]  [place]   [address]   [place]: [phone]  F: [phone]      Physical Therapy Daily Treatment Note    Date:  05/27/2019  Patient: Monica Wood                        DOB: 07/13/1976                      MRN: [id number]  Physician: Megan Salon, MD                          Insurance: [employer]  Medical Diagnosis: Pain in right shoulder                Rehab Codes: M25.511  Onset Date: 07-May-2019               Next Dr.'s appt.: --  Visit# / total visits: 7/12  Cancels/No Shows: 0/0    Subjective: Pt arrived with very minimal pain in R shoulder. Pain is in anterior shoulder into bicep region. Pt noting only complaint is sleeping is still difficult. Pt notes she has tried to prop shoulder and sleep in different positions and nothing seems to help.   Pain:  [x]  Yes  []  No  Location: R shoulder  Pain Rating: (0-10 scale) 1-2/10  Pain altered Tx:  [x]  No  []  Yes  Action:  Comments:     Today???s Treatment:                                        Exercises:  Exercise  R Shoulder Reps/ Time Weight/ Level Comments today   UBE 6' L9  x   UT S 3x30" ??  x   Levator S 3x30" ??  x   Doorway pec S 3x30" ??  x   Doorway Rhomboid S 3x30" ??            Prone: ?? ??     Retraction 2x15, 5"?? ?? HEP    Depression 2x15, 5" ?? HEP    W's 15x, 5"  HEP    ITY's 10x 1#  x          Wall push-up plus 2x12   x   ??SA punches 2x10 ??     ??TB rows 2x10 lime??  x   ??TB shoulder extension  2x10 lime  x   ??HAB on wall ??2x10 lime??     Sleeper stretch 3x30"?? ??     tband ER w/ retraction 2x10 lime  x          SL ER 2x10 1#  x   SL HAB 2x10  To painful    SL abduction 2x10 1#  x   Other: Hypervolt to R proximal bicep, pec major        Treatment Charges: Mins Units   []   Modalities     [x]   Ther Exercise 35 2   [x]   Manual Therapy 10 1   []   Ther Activities     []   Aquatics     []   Vasocompression     []   Other     Total Treatment time 45 3       Assessment: [x]  Progressing toward goals. Progressed pt with adding in weight for SL exercises as well as ITY's with good tolerance. Pt noting no increased in pain with weight just muscle fatigue and weakness. Pt did have pain with ER w/ retraction with pt cued to decrease ROM with no pain noted. Decreased tightness noted post manual. Continue to progress as tol.     []  No change.     []  Other:  [x]  Patient would continue to benefit from skilled physical therapy services in order to:     STG: (to be met in 6 treatments)  1. ? Pain: Pt to decrease pain levels to 5/10 with all activities to ease ADLs.  2. ? ROM: Increase flexibility in cervical spine and R shoulder to improve alignment for better motion mechanics and to help decrease pain.  3. Pt will self-report ability to sleep without being woken up by R shoulder pain, allowing pt to be better rested.  4. Independent with Home Exercise Programs  ??  LTG: (to be met in 12 treatments)  1. Improve score of UEFS from 45% impaired to <20% impaired to allow for better ability to use R arm without pain.  2. ? Strength: Pt will demonstrate 5/5 scapular stabilizer strength, allowing for better posture and R arm mechanics with overhead motion.  3. ? Function: Pt will be able to demonstrate ability to lift R arm overhead with no compensations with UT.  4. Decrease pain to 0/10  ??  Patient goals: "Healing and info on how to prevent injury in future"    Pt. Education:  [x]  Yes  []  No  [x]  Reviewed Prior HEP/Ed  Method of  Education: [x]  Verbal  [x]  Demo  []  Written  Comprehension of Education:  [x]  Verbalizes understanding.  [x]  Demonstrates understanding.  [x]  Needs review.  []  Demonstrates/verbalizes HEP/Ed previously given.     Plan: [x]  Continue current frequency toward long and short term goals.    [x]  Specific Instructions for subsequent treatments: Continue tx per POC      Time In:  11:00 am           Time Out: 11:45 am    Electronically signed by:  Elta Guadeloupe, PTA

## 2019-05-30 ENCOUNTER — Inpatient Hospital Stay: Admit: 2019-05-30 | Discharge: 2019-05-30 | Payer: PRIVATE HEALTH INSURANCE | Primary: Internal Medicine

## 2019-05-30 NOTE — Other (Signed)
[] [facility] &  Therapy  [address]  [phone]  F: [phone] [] [place]  [address]   [address]  P: [phone]  F: [phone] [x] [place]  [place]  P: [phone]  F: [phone] [] [place]  P: [phone]  F: [phone] [] [place]  [place]   [address]   [place]: [phone]  F: [phone]      Physical Therapy Daily Treatment Note    Date:  05/30/2019  Patient: Monica Wood                        DOB: 1975/08/16                      MRN: [id number]  Physician: Megan Salon, MD                          Insurance: [employer]  Medical Diagnosis: Pain in right shoulder                Rehab Codes: M25.511  Onset Date: 2019/04/15               Next Dr.'s appt.: --  Visit# / total visits: 8/12  Cancels/No Shows: 0/0    Subjective: Pt reporting no pain right now in R shoulder. States she has some increased soreness and pain after visit for about 2 days and then it goes away.   Pain:  [] Yes  [x] No  Location: R shoulder  Pain Rating: (0-10 scale) 0/10  Pain altered Tx:  [x] No  [] Yes  Action:  Comments:     Today???s Treatment:                                        Exercises:  Exercise  R Shoulder Reps/ Time Weight/ Level Comments today   UBE 6' L9  x   UT S 3x30" ??  x   Levator S 3x30" ??  x   Doorway pec S 3x30" ??  x   Doorway Rhomboid S 3x30" ??            Prone: ?? ??     Retraction 2x15, 5"?? ?? HEP    Depression 2x15, 5" ?? HEP    W's 15x, 5"  HEP    ITY's 10x 1#  x          Wall push-up plus 2x12   x   ??SA punches 2x10 ??     ??TB rows 2x10 lime??  x   ??TB shoulder extension 2x10 lime  x   ??HAB on wall ??2x10 lime??     Sleeper stretch 3x30"?? ??     tband ER w/ retraction 2x10 lime  x           SL ER 2x10 1#  x   SL HAB 2x10  To painful    SL abduction 2x10 1#  x   Other: Hypervolt to  R proximal bicep, pec major, lateral delt    DI to lat, teres major and minor, infraspinatus         Treatment Charges: Mins Units   []  Modalities     [x]  Ther Exercise 21 1   [x]  Manual Therapy 23 2   []  Ther Activities     []  Aquatics     []  Vasocompression     []  Other     Total Treatment time 44 3       Assessment: [x] Progressing toward goals. Performed more manual with pt having tenderness and areas stated above with relief noted. Pt having more ROM and able to complete exercises with not as much difficulty. Pt having some increase in pain with wall pushups in anterior shoulder, with VC's for decreased distance of push up with good carryover. Assess response to manual and progress as tol.     [] No change.     [] Other:  [x] Patient would continue to benefit from skilled physical therapy services in order to:     STG: (to be met in 6 treatments)  1. ? Pain: Pt to decrease pain levels to 5/10 with all activities to ease ADLs.  2. ? ROM: Increase flexibility in cervical spine and R shoulder to improve alignment for better motion mechanics and to help decrease pain.  3. Pt will self-report ability to sleep without being woken up by R shoulder pain, allowing pt to be better rested.  4. Independent with Home Exercise Programs  ??  LTG: (to be met in 12 treatments)  1. Improve score of UEFS from 45% impaired to <20% impaired to allow for better ability to use R arm without pain.  2. ? Strength: Pt will demonstrate 5/5 scapular stabilizer strength, allowing for better posture and R arm mechanics with overhead motion.  3. ? Function: Pt will be able to demonstrate ability to lift R arm overhead with no compensations with UT.  4. Decrease pain to 0/10  ??  Patient goals: "Healing and info on how to prevent injury in future"    Pt. Education:  [x] Yes  [] No  [x] Reviewed Prior HEP/Ed  Method of Education: [x]  Verbal  [x] Demo  [] Written  Comprehension of Education:  [x] Verbalizes understanding.  [x] Demonstrates understanding.  [x] Needs review.  [] Demonstrates/verbalizes HEP/Ed previously given.     Plan: [x] Continue current frequency toward long and short term goals.    [x] Specific Instructions for subsequent treatments: Continue tx per POC      Time In:  5:00 pm           Time Out: 5:50 pm    Electronically signed by:  Elta Guadeloupe, PTA

## 2019-06-03 ENCOUNTER — Inpatient Hospital Stay: Admit: 2019-06-03 | Discharge: 2019-06-03 | Payer: PRIVATE HEALTH INSURANCE | Primary: Internal Medicine

## 2019-06-03 NOTE — Other (Signed)
[]  [facility] &  Therapy  [address]  [phone]  F: [phone] []  [place]  [address]   [address]  P: [phone]  F: [phone] [x]  [place]  [address]  P: [phone]  F: [phone] []  [place]: [phone]  F: [phone] []  [place]  [place]   [address]   [place]: [phone]  F: [phone]      Physical Therapy Daily Treatment Note    Date:  06/03/2019  Patient: Monica Wood                        DOB: Mar 19, 1976                      MRN: [id number]  Physician: Megan Salon, MD                          Insurance: [employer]  Medical Diagnosis: Pain in right shoulder                Rehab Codes: M25.511  Onset Date: 04/21/19               Next Dr.'s appt.: --  Visit# / total visits: 9/12  Cancels/No Shows: 0/0    Subjective: Pt arrived having increased R shoulder pain. States she feels she could have slept on shoulder wrong. Pt notes some bruising on arm from hypervolt.   Pain:  [x]  Yes  []  No  Location: R shoulder  Pain Rating: (0-10 scale) 5/10  Pain altered Tx:  [x]  No  []  Yes  Action:  Comments:     Today???s Treatment:                                        Exercises:  Exercise  R Shoulder Reps/ Time Weight/ Level Comments today   UBE 6' L9  x   UT S 3x30" ??  x   Levator S 3x30" ??  x   Doorway pec S 3x30" ??  x   Doorway Rhomboid S 3x30" ??     Sleeper stretch 3x30"   x   ER stretch supine 3x30" 2#  x   Doorway bicep stretch 3x30"   x   Wand ER stretch standing 3x30"   x   Prone: ?? ??     Retraction 2x15, 5"?? ?? HEP    Depression 2x15, 5" ?? HEP    W's 15x, 5"  HEP    ITY's 10x 1#            Wall push-up plus 2x12   x   ??SA punches 2x10 ??     ??TB rows 2x10 lime??  x   ??TB  shoulder extension 2x10 lime  x   ??HAB on wall ??2x10 lime??     Sleeper stretch 3x30"?? ??     tband ER w/ retraction 2x10 lime  x          SL ER  2x10 1#     SL HAB 2x10  To painful    SL abduction 2x10 1#     Other: Hypervolt to R proximal bicep, pec major, lateral delt    DI to lat, teres major and minor, infraspinatus         Treatment Charges: Mins Units   []   Modalities     [x]   Ther Exercise 25 1   [x]   Manual Therapy 15 2   []   Ther Activities     []   Aquatics     []   Vasocompression     []   Other     Total Treatment time 40 3       Assessment: [x]  Progressing toward goals. Focused today's treatment on stretches and tender areas surrounding shoulder. Pt noting tenderness in lat and infraspinatus this date, with some relief noted. Pt asked if there is anything that can be done to help from this from returning, with pt educated on stretches and self DI.  Pt unable to perform ER with TB this date due high pain. Issued new stretches for HEP with pt able to demonstrate proper form.     []  No change.     []  Other:  [x]  Patient would continue to benefit from skilled physical therapy services in order to:     STG: (to be met in 6 treatments)  1. ? Pain: Pt to decrease pain levels to 5/10 with all activities to ease ADLs.  2. ? ROM: Increase flexibility in cervical spine and R shoulder to improve alignment for better motion mechanics and to help decrease pain.  3. Pt will self-report ability to sleep without being woken up by R shoulder pain, allowing pt to be better rested.  4. Independent with Home Exercise Programs  ??  LTG: (to be met in 12 treatments)  1. Improve score of UEFS from 45% impaired to <20% impaired to allow for better ability to use R arm without pain.  2. ? Strength: Pt will demonstrate 5/5 scapular stabilizer strength, allowing for better posture and R arm mechanics with overhead motion.  3. ? Function: Pt will be able to demonstrate ability to lift R arm overhead with no compensations with  UT.  4. Decrease pain to 0/10  ??  Patient goals: "Healing and info on how to prevent injury in future"    Pt. Education:  [x]  Yes  []  No  [x]  Reviewed Prior HEP/Ed  Method of Education: [x]  Verbal  [x]  Demo  []  Written  Comprehension of Education:  [x]  Verbalizes understanding.  [x]  Demonstrates understanding.  [x]  Needs review.  []  Demonstrates/verbalizes HEP/Ed previously given.     Plan: [x]  Continue current frequency toward long and short term goals.    [x]  Specific Instructions for subsequent treatments: Continue tx per POC      Time In:  11:00 am           Time Out:11:50  am    Electronically signed by:  Elta Guadeloupe, PTA

## 2019-06-06 ENCOUNTER — Encounter

## 2019-06-06 ENCOUNTER — Inpatient Hospital Stay: Admit: 2019-06-06 | Payer: PRIVATE HEALTH INSURANCE | Primary: Internal Medicine

## 2019-06-06 DIAGNOSIS — Z1231 Encounter for screening mammogram for malignant neoplasm of breast: Secondary | ICD-10-CM

## 2019-06-07 ENCOUNTER — Inpatient Hospital Stay: Admit: 2019-06-07 | Discharge: 2019-06-07 | Payer: PRIVATE HEALTH INSURANCE | Primary: Internal Medicine

## 2019-06-07 NOTE — Other (Signed)
[]  [facility] &  Therapy  [address]  [phone]  F: [phone] []  [place]  [address]   [address]  P: [phone]  F: [phone] [x]  [employer]  [place]  P: [phone]  F: [phone] []  [place]: [phone]  F: [phone] []  [place]  [place]   [address]   [place]: [phone]  F: [phone]      Physical Therapy Daily Treatment Note    Date:  06/07/2019  Patient: Monica Wood                        DOB: [birth year]                      MRN: [id number]  Physician: Megan Salon, MD                          Insurance: [employer]  Medical Diagnosis: Pain in right shoulder                Rehab Codes: M25.511  Onset Date: 04-23-2019               Next Dr.'s appt.: --  Visit# / total visits: 10/12  Cancels/No Shows: 0/0    Subjective: Pt presents to PT with no pain today, stating that her shoulder is feeling good for the most part, unless she tries to sleep on it.  Pain:  []  Yes  [x]  No  Location: R shoulder  Pain Rating: (0-10 scale) 0/10  Pain altered Tx:  [x]  No  []  Yes  Action:  Comments:     Today???s Treatment:                                        Exercises:  Exercise  R Shoulder Reps/ Time Weight/ Level Comments today   UBE 6' L9  x   UT S 3x30" ??  x   Levator S 3x30" ??  x   Doorway pec S 3x30" ??  x   Doorway Rhomboid S 3x30" ??  x   Sleeper stretch 3x30" 1#  x   ER stretch supine 3x30" 2#     Doorway bicep stretch 3x30"      Wand ER stretch standing 3x30"      Eccentric biceps curls 2x10 4#  x   Prone: ?? ??     Retraction 2x15, 5"?? ?? HEP    Depression 2x15, 5" ?? HEP    W's 15x, 5"   x   ITY's 10x 1#            Wall push-up plus 2x12      ??SA punches 2x10 ??     ??TB rows 2x10  lime??     ??TB shoulder extension 2x10 lime     ??HAB on wall ??2x10 lime??     Sleeper stretch 3x30"?? ??     tband ER w/ retraction 2x10 lime  SL ER 2x10 1#     SL HAB 2x10  To painful    SL abduction 2x10 1#     Other: Myofascial release to proximal R biceps with [employer]        Treatment Charges: Mins Units   []   Modalities     [x]   Ther Exercise 25 1   [x]   Manual Therapy 11 1   []   Ther Activities     []   Aquatics     [x]   Vasocompression 15 1   []   Other     Total Treatment time 51 3       Assessment: [x]  Progressing toward goals. Started treatment with UBE today for warm up and then completed stretching next to continue to work on flexibility/mobility. Due to pt reporting that she is still having issues with sleeping and stating that this hasn't really improved, decided to attempt myofascial release and assess response. During completion, pt reports increased tenderness, and reports some soreness following, so ended treatment with vaso to R shoulder.    []  No change.     []  Other:  [x]  Patient would continue to benefit from skilled physical therapy services in order to:     STG: (to be met in 6 treatments)  1. ? Pain: Pt to decrease pain levels to 5/10 with all activities to ease ADLs.  2. ? ROM: Increase flexibility in cervical spine and R shoulder to improve alignment for better motion mechanics and to help decrease pain.  3. Pt will self-report ability to sleep without being woken up by R shoulder pain, allowing pt to be better rested.  4. Independent with Home Exercise Programs  ??  LTG: (to be met in 12 treatments)  1. Improve score of UEFS from 45% impaired to <20% impaired to allow for better ability to use R arm without pain.  2. ? Strength: Pt will demonstrate 5/5 scapular stabilizer strength, allowing for better posture and R arm mechanics with overhead motion.  3. ? Function: Pt will be able to demonstrate ability to lift R arm overhead with no compensations with UT.  4. Decrease pain to  0/10  ??  Patient goals: "Healing and info on how to prevent injury in future"    Pt. Education:  [x]  Yes  []  No  [x]  Reviewed Prior HEP/Ed  Method of Education: [x]  Verbal  [x]  Demo  []  Written  Comprehension of Education:  [x]  Verbalizes understanding.  [x]  Demonstrates understanding.  [x]  Needs review.  []  Demonstrates/verbalizes HEP/Ed previously given.     Plan: [x]  Continue current frequency toward long and short term goals.    [x]  Specific Instructions for subsequent treatments: Continue tx per POC      Time In: 0700           Time Out: 0759    Electronically signed by:  Deeann Dowse, PT

## 2019-06-13 ENCOUNTER — Inpatient Hospital Stay: Admit: 2019-06-13 | Discharge: 2019-06-13 | Payer: PRIVATE HEALTH INSURANCE | Primary: Internal Medicine

## 2019-06-13 NOTE — Progress Notes (Addendum)
[]  [facility] &  Therapy  [address]  [phone]  F: [phone] []  [place]  [address]   [address]  P: [phone]  F: [phone] [x]  [place]  [facility] &  Therapy  [place]  P: [phone]  F: [phone] []  [place]: [phone]  F: [phone] []  [place]  [place]   [address]   [place]: [phone]  F: [phone]      Physical Therapy Daily Treatment Note/ Progress Report    Date:  06/13/2019  Patient: Monica Wood                        DOB: 04/23/1976                      MRN: [id number]  Physician: Megan Salon, MD                          Insurance: [employer]  Medical Diagnosis: Pain in right shoulder                Rehab Codes: M25.511  Onset Date: 05/07/2019               Next Dr.'s appt.: --  Visit# / total visits: 11/12  Cancels/No Shows: 0/0    Subjective: Pt presents to PT denying any pain in shoulder. Pt states that she is still having problems with sleeping at this time.  Pain:  []  Yes  [x]  No  Location: R shoulder  Pain Rating: (0-10 scale) 0/10  Pain altered Tx:  [x]  No  []  Yes  Action:  Comments:     Today???s Treatment:                                        Exercises:  Exercise  R Shoulder Reps/ Time Weight/ Level Comments today   UBE 6' L9  x   UT S 3x30" ??  x   Levator S 3x30" ??  x   Doorway pec S 3x30" ??  x   Doorway Rhomboid S 3x30" ??     Sleeper stretch 3x30" 1#     ER stretch supine 3x30" 2#     Doorway bicep stretch 3x30"      Wand ER stretch standing 3x30"      Eccentric biceps curls 2x10 4#     Prone: ?? ??     Retraction 2x15, 5"?? ?? HEP    Depression 2x15, 5" ?? HEP    W's 15x, 5"      ITY's 2x10   x          Wall push-up plus 2x12      ??SA punches 2x10 ??     ??TB rows 2x10  lime??     ??TB shoulder extension 2x10 lime     ??HAB on wall ??2x10 lime??     Sleeper stretch 3x30"?? ??     tband ER w/ retraction 2x10 lime  SL ER 2x10 1#     SL HAB 2x10  To painful    SL abduction 2x10 1#     Wall Surrenders 2x10, 5"   x   Wall walks x10 Orange  x                 Other: Myofascial release to proximal R biceps with [employer]        Treatment Charges: Mins Units   []   Modalities     [x]   Ther Exercise 31 2   [x]   Manual Therapy 13 1   []   Ther Activities     []   Aquatics     [x]   Vasocompression 15 1   []   Other     Total Treatment time 59 4       Assessment: [x]  Progressing toward goals. Continued with treatment per log above with good pt tolerance. Focused exercises on scapular stabilizer strength today, with pt requiring consistent cueing for first set with prone exercises to engage proper muscles, however demonstrates good carryover during seconds set. Also added wall walks and wall surrenders to progress scapular stabilizer strength. Once again completed manual with [facility] as pt denies any increased pain following completion last week to keep attempting to improve soft tissue flexibility. Since starting therapy, pt states that she has noticed that sleeping has gotten better, however still states that she experiences pain. Pt notes that she has also noticed that she has been able to sleep for longer periods without needing to take as much medication. At this time, pt would states that she would like to continue with more PT, and would benefit from additional 12 visits of therapy to continue to work on addressing symptoms to ease R shoulder use and sleep.    []  No change.     []  Other:  [x]  Patient would continue to benefit from skilled physical therapy services in order to:     STG: (to be met in 6 treatments)  1. ? Pain: Pt to decrease pain levels to 5/10 with all activities to ease ADLs.: ONGOING, still up to 7-8/10 (06/13/19)  2. ? ROM: Increase flexibility in cervical spine and  R shoulder to improve alignment for better motion mechanics and to help decrease pain.: ONGOING, pt still self-reports tightness, and still presents with tightness in R UT, R pecs (06/13/19)  3. Pt will self-report ability to sleep without being woken up by R shoulder pain, allowing pt to be better rested.: ONGOING, still has pain with sleeping (06/13/19)  4. Independent with Home Exercise Programs: MET (06/13/19)  ??  LTG: (to be met in 12 treatments)  1. Improve score of UEFS from 45% impaired to <20% impaired to allow for better ability to use R arm without pain.: ONGOING, pt scored 35% impaired (06/13/19)  2. ? Strength: Pt will demonstrate 5/5 scapular stabilizer strength, allowing for better posture and R arm mechanics with overhead motion.: ONGOING, pt still with UT compensations (06/13/19)  3. ? Function: Pt will be able to demonstrate ability to lift R arm overhead with no compensations with UT.: ONGOING, still compensates with UT (06/13/19)  4. Decrease pain to 0/10: ONGOING, still up to 7-8/10 (06/13/19)  ??  Patient goals: "Healing and info on how to prevent injury in future"    Pt. Education:  [x]  Yes  []  No  [x]  Reviewed Prior HEP/Ed  Method of Education: [x]  Verbal  [x]  Demo  []  Written  Comprehension of Education:  [  x] Verbalizes understanding.  [x]  Demonstrates understanding.  [x]  Needs review.  []  Demonstrates/verbalizes HEP/Ed previously given.     Plan: [x]  Continue current frequency toward long and short term goals.  Pt would benefit from additional 12 visits at 2x/week  [x]  Specific Instructions for subsequent treatments: Continue tx per POC      Time In: 1700           Time Out: 1807    Electronically signed by:  Deeann Dowse, PT        Physician Signature:________________________________Date:__________________  By signing above or cosigning this note, I have reviewed this plan of care and certify a need for medically necessary rehabilitation services.   ??  *PLEASE SIGN ABOVE AND FAX BACK ALL  PAGES*

## 2019-06-17 ENCOUNTER — Inpatient Hospital Stay: Admit: 2019-06-17 | Discharge: 2019-06-17 | Payer: PRIVATE HEALTH INSURANCE | Primary: Internal Medicine

## 2019-06-17 DIAGNOSIS — M25511 Pain in right shoulder: Secondary | ICD-10-CM

## 2019-06-17 NOTE — Other (Signed)
[]  [facility] &  Therapy  [address]  [phone]  F: [phone] []  [place]  [address]   [address]  P: [phone]  F: [phone] [x]  [place]  [facility]patient [facility] &  Therapy  [place]  P: [phone]  F: [phone] []  [place]: [phone]  F: [phone] []  [place]  [place]   [address]   [place]: [phone]  F: [phone]      Physical Therapy Daily Treatment Note/ Progress Report    Date:  06/17/2019  Patient: Monica Wood                        DOB: [birth year]                      MRN: [id number]  Physician: Megan Salon, MD                          Insurance: [employer]  Medical Diagnosis: Pain in right shoulder                Rehab Codes: M25.511  Onset Date: 2019-05-04               Next Dr.'s appt.: --  Visit# / total visits: 12/12  Cancels/No Shows: 0/0    Subjective: Pt arrived reporting no pain at the moment. Was able to workout legs and back yesterday with no shoulder issues. Pt states hawkgrips seem to be helping.   Pain:  []  Yes  [x]  No  Location: R shoulder  Pain Rating: (0-10 scale) 0/10  Pain altered Tx:  [x]  No  []  Yes  Action:  Comments:     Today???s Treatment:                                        Exercises:  Exercise  R Shoulder Reps/ Time Weight/ Level Comments today   UBE 6' L9  x   UT S 3x30" ??  x   Levator S 3x30" ??  x   Doorway pec S 3x30" ??  x   Doorway Rhomboid S 3x30" ??     Sleeper stretch 3x30" 1#     ER stretch supine 3x30" 2#     Doorway bicep stretch 3x30"      Wand ER stretch standing 3x30"      Eccentric biceps curls 2x10 4#     Prone: ?? ??     Retraction 2x15, 5"?? ?? HEP    Depression 2x15, 5" ?? HEP    W's 15x, 5"      ITY's 2x10   x          Wall push-up plus 2x12      ??SA  punches 2x10 ??     ??TB rows 2x10 lime??     ??TB shoulder extension 2x10 lime     ??HAB on wall ??2x10 lime??     Sleeper stretch 3x30"?? ??     tband ER w/ retraction 2x10 lime  SL ER 2x10 1#     SL HAB 2x10  To painful    SL abduction 2x10 1#     Wall Surrenders 2x10, 5"   x   Wall walks x10 Orange  x                 Other: Myofascial release to proximal R biceps with [employer]        Treatment Charges: Mins Units   []   Modalities     [x]   Ther Exercise 31 2   [x]   Manual Therapy 13 1   []   Ther Activities     []   Aquatics     [x]   Vasocompression 15 1   []   Other     Total Treatment time 59 4       Assessment: [x]  Progressing toward goals. Continued with stretches and exercises per chart with good tolerance. Pt having some pain pain with wall walks but stated more muscle fatigue than pain. Pt not as tender in R bicep this treatment, but having tenderness in lateral deltoid with good relief noted. Ended with vaso for decreased soreness.     []  No change.     []  Other:  [x]  Patient would continue to benefit from skilled physical therapy services in order to:     STG: (to be met in 6 treatments)  1. ? Pain: Pt to decrease pain levels to 5/10 with all activities to ease ADLs.: ONGOING, still up to 7-8/10 (06/13/19)  2. ? ROM: Increase flexibility in cervical spine and R shoulder to improve alignment for better motion mechanics and to help decrease pain.: ONGOING, pt still self-reports tightness, and still presents with tightness in R UT, R pecs (06/13/19)  3. Pt will self-report ability to sleep without being woken up by R shoulder pain, allowing pt to be better rested.: ONGOING, still has pain with sleeping (06/13/19)  4. Independent with Home Exercise Programs: MET (06/13/19)  ??  LTG: (to be met in 12 treatments)  1. Improve score of UEFS from 45% impaired to <20% impaired to allow for better ability to use R arm without pain.: ONGOING, pt scored 35% impaired (06/13/19)  2. ? Strength: Pt will demonstrate 5/5  scapular stabilizer strength, allowing for better posture and R arm mechanics with overhead motion.: ONGOING, pt still with UT compensations (06/13/19)  3. ? Function: Pt will be able to demonstrate ability to lift R arm overhead with no compensations with UT.: ONGOING, still compensates with UT (06/13/19)  4. Decrease pain to 0/10: ONGOING, still up to 7-8/10 (06/13/19)  ??  Patient goals: "Healing and info on how to prevent injury in future"    Pt. Education:  [x]  Yes  []  No  [x]  Reviewed Prior HEP/Ed  Method of Education: [x]  Verbal  [x]  Demo  []  Written  Comprehension of Education:  [x]  Verbalizes understanding.  [x]  Demonstrates understanding.  [x]  Needs review.  []  Demonstrates/verbalizes HEP/Ed previously given.     Plan: [x]  Continue current frequency toward long and short term goals.  Pt would benefit from additional 12 visits at 2x/week  [x]  Specific Instructions for subsequent treatments: Continue tx per POC      Time In: 11:00 am           Time Out: 12:00 pm    Electronically signed by:  Elta Guadeloupe, PTA

## 2019-06-20 ENCOUNTER — Inpatient Hospital Stay: Admit: 2019-06-20 | Discharge: 2019-06-20 | Payer: PRIVATE HEALTH INSURANCE | Primary: Internal Medicine

## 2019-06-20 NOTE — Other (Signed)
[]  [facility] &  Therapy  [address]  [phone]  F: [phone] []  [place]  [address]   [address]  P: [phone]  F: [phone] [x]  [place]  [facility] &  Therapy  [address]  P: [phone]  F: [phone] []  [place]: [phone]  F: [phone] []  [place]  [place]   [address]   [place]: [phone]  F: [phone]      Physical Therapy Daily Treatment Note/ Progress Report    Date:  06/20/2019  Patient: Monica Wood                        DOB: October 19, 1975                      MRN: [id number]  Physician: Megan Salon, MD                          Insurance: [employer]  Medical Diagnosis: Pain in right shoulder                Rehab Codes: M25.511  Onset Date: 04/28/2019               Next Dr.'s appt.: --  Visit# / total visits: 13/12  Cancels/No Shows: 0/0    Subjective: Pt reporting some pain in anterior R shoulder this date, stating she was doing overhead tasks at home which could have increased pain.    Pain:  [x]  Yes  []  No  Location: R shoulder  Pain Rating: (0-10 scale) 2/10  Pain altered Tx:  [x]  No  []  Yes  Action:  Comments:     Today???s Treatment:                                        Exercises:  Exercise  R Shoulder Reps/ Time Weight/ Level Comments today   UBE 6' L9  x   UT S 3x30" ??  x   Levator S 3x30" ??  x   Doorway pec S 3x30" ??  x   Doorway Rhomboid S 3x30" ??     Sleeper stretch 3x30" 1#     ER stretch supine 3x30" 2#     Doorway bicep stretch 3x30"      Wand ER stretch standing 3x30"      Eccentric biceps curls 2x10 4#     Prone: ?? ??     Retraction 2x15, 5"?? ?? HEP    Depression 2x15, 5" ?? HEP    W's 15x, 5"      ITY's 2x10   x          Wall push-up plus 2x12      ??SA punches 2x10 ??      ??TB rows 2x10 lime??     ??TB shoulder extension 2x10 lime     ??HAB on wall ??2x10 lime??     Sleeper stretch 3x30"?? ??     tband ER w/ retraction 2x10 lime  SL ER 2x10 1#     SL HAB 2x10  To painful    SL abduction 2x10 1#     Wall Surrenders 2x10, 5"   x   Wall walks x10 Orange Up/down, lateral x                 Other: Myofascial release to proximal R biceps with [employer], hypervolt to [employer], and lateral delt, DI to pec and lateral delt        Treatment Charges: Mins Units   []   Modalities     [x]   Ther Exercise 15 1   [x]   Manual Therapy 25 2   []   Ther Activities     []   Aquatics     [x]   Vasocompression 15 1   []   Other     Total Treatment time 55 4       Assessment: [x]  Progressing toward goals. Focused on manual work this date, due to pt having increased tenderness since last visit. Pt noting decreased tightness post manual this date. Issued band for HEP with pt displaying good form with exercises. Ended with vaso for decreased symptoms.     []  No change.     []  Other:  [x]  Patient would continue to benefit from skilled physical therapy services in order to:     STG: (to be met in 6 treatments)  1. ? Pain: Pt to decrease pain levels to 5/10 with all activities to ease ADLs.: ONGOING, still up to 7-8/10 (06/13/19)  2. ? ROM: Increase flexibility in cervical spine and R shoulder to improve alignment for better motion mechanics and to help decrease pain.: ONGOING, pt still self-reports tightness, and still presents with tightness in R UT, R pecs (06/13/19)  3. Pt will self-report ability to sleep without being woken up by R shoulder pain, allowing pt to be better rested.: ONGOING, still has pain with sleeping (06/13/19)  4. Independent with Home Exercise Programs: MET (06/13/19)  ??  LTG: (to be met in 12 treatments)  1. Improve score of UEFS from 45% impaired to <20% impaired to allow for better ability to use R arm without pain.: ONGOING, pt scored 35% impaired (06/13/19)  2. ? Strength: Pt will  demonstrate 5/5 scapular stabilizer strength, allowing for better posture and R arm mechanics with overhead motion.: ONGOING, pt still with UT compensations (06/13/19)  3. ? Function: Pt will be able to demonstrate ability to lift R arm overhead with no compensations with UT.: ONGOING, still compensates with UT (06/13/19)  4. Decrease pain to 0/10: ONGOING, still up to 7-8/10 (06/13/19)  ??  Patient goals: "Healing and info on how to prevent injury in future"    Pt. Education:  [x]  Yes  []  No  [x]  Reviewed Prior HEP/Ed  Method of Education: [x]  Verbal  [x]  Demo  []  Written  Comprehension of Education:  [x]  Verbalizes understanding.  [x]  Demonstrates understanding.  [x]  Needs review.  []  Demonstrates/verbalizes HEP/Ed previously given.     Plan: [x]  Continue current frequency toward long and short term goals.  Pt would benefit from additional 12 visits at 2x/week  [x]  Specific Instructions for subsequent treatments: Continue tx per POC      Time In: 6:00 pm           Time Out: 7:02 pm    Electronically signed by:  Elta Guadeloupe, PTA

## 2019-06-24 ENCOUNTER — Inpatient Hospital Stay: Admit: 2019-06-24 | Discharge: 2019-06-24 | Payer: PRIVATE HEALTH INSURANCE | Primary: Internal Medicine

## 2019-06-24 NOTE — Other (Signed)
$'[]'y$  [facility] &  Therapy  [address]  [phone]  F: [phone] '[]'$  [place]  [address]   [address]  P: [phone]  F: [phone] '[x]'$  [place]  [place]  P: [phone]  F: [phone] '[]'$  [facility]  Outpatient Rehabilitation &  Therapy  [address]  P: [phone]  F: [phone] '[]'$  [place]  [place]   [address]   [place]: [phone]  F: [phone]      Physical Therapy Daily Treatment Note/ Progress Report    Date:  06/24/2019  Patient: Monica Wood                          DOB: [birth year]                      MRN: [id number]  Physician: Megan Salon, MD                            Insurance: [employer]  Medical Diagnosis: Pain in right shoulder                Rehab Codes: M25.511  Onset Date: 04/15/19                  Next Dr.'s appt.: --  Visit# / total visits: 14/12    Cancels/No Shows: 0/0    Subjective: Pt states she feels more soreness into R shoulder this morning is d/t working out yesterday.      Pain:  '[x]'$  Yes  '[]'$  No  Location: R shoulder  Pain Rating: (0-10 scale) 2/10  Pain altered Tx:  '[x]'$  No  '[]'$  Yes  Action:  Comments:     Today???s Treatment:                                        Exercises:  Exercise  R Shoulder Reps/ Time Weight/ Level Comments today   UBE 6' L9  x   UT S 3x30" ??  x   Levator S 3x30" ??  x   Doorway pec S 3x30" ??  x   Doorway Rhomboid S 3x30" ??     Sleeper stretch 3x30" 1#     ER stretch supine 3x30" 2#     Doorway bicep stretch 3x30"      Wand ER stretch standing 3x30"      Eccentric biceps curls 2x10 4#     Prone: ?? ??     Retraction 2x15, 5"?? ?? HEP    Depression 2x15, 5" ?? HEP    W's 15x, 5"      ITY's 2x10             Wall push-up plus 2x12      ??SA punches 2x10 ??     ??TB rows 2x10 lime??     ??TB  shoulder extension 2x10 lime     ??HAB on wall ??2x10 lime??     Sleeper stretch 3x30"?? ??     tband ER w/ retraction 2x10  lime            SL ER 2x10 1#     SL HAB 2x10  To painful    SL abduction 2x10 1#     Wall Surrenders 2x10, 5"   x   Wall walks x10 Orange Up/down, lateral x                 Other: Myofascial release to proximal R biceps, short head biceps mucle belly with Hawk Grips, hypervolt to [employer], and lateral delt, DI to pec and lateral delt        Treatment Charges: Mins Units   '[]'$   Modalities     '[x]'$   Ther Exercise 15 1   '[x]'$   Manual Therapy 25 2   '[]'$   Ther Activities     '[]'$   Aquatics     '[x]'$   Vasocompression 15 1   '[]'$   Other     Total Treatment time 55 4       Assessment: '[x]'$  Progressing toward goals. Continued with focus on manual as noted above to address adhesion and tension of R UE. Remains extremely tender into R biceps, particularly into short head with IASTM via hawkgrips. Fair erythema response noted with IASTM this date. Stretches complete pre and post manual this date. Continued with vaso for symptom control.       '[]'$  No change.     '[]'$  Other:  '[x]'$  Patient would continue to benefit from skilled physical therapy services in order to:     STG: (to be met in 6 treatments)  1. ? Pain: Pt to decrease pain levels to 5/10 with all activities to ease ADLs.: ONGOING, still up to 7-8/10 (06/13/19)  2. ? ROM: Increase flexibility in cervical spine and R shoulder to improve alignment for better motion mechanics and to help decrease pain.: ONGOING, pt still self-reports tightness, and still presents with tightness in R UT, R pecs (06/13/19)  3. Pt will self-report ability to sleep without being woken up by R shoulder pain, allowing pt to be better rested.: ONGOING, still has pain with sleeping (06/13/19)  4. Independent with Home Exercise Programs: MET (06/13/19)  ??  LTG: (to be met in 12 treatments)  1. Improve score of UEFS from 45% impaired to <20% impaired to allow for better ability to use R arm without  pain.: ONGOING, pt scored 35% impaired (06/13/19)  2. ? Strength: Pt will demonstrate 5/5 scapular stabilizer strength, allowing for better posture and R arm mechanics with overhead motion.: ONGOING, pt still with UT compensations (06/13/19)  3. ? Function: Pt will be able to demonstrate ability to lift R arm overhead with no compensations with UT.: ONGOING, still compensates with UT (06/13/19)  4. Decrease pain to 0/10: ONGOING, still up to 7-8/10 (06/13/19)  ??  Patient goals: "Healing and info on how to prevent injury in future"    Pt. Education:  '[x]'$  Yes  '[]'$  No  '[x]'$  Reviewed Prior HEP/Ed  Method of Education: '[x]'$  Verbal  '[x]'$  Demo  '[]'$  Written  Comprehension of Education:  '[x]'$  Verbalizes understanding.  '[x]'$  Demonstrates understanding.  '[x]'$  Needs review.  '[]'$  Demonstrates/verbalizes HEP/Ed previously given.     Plan: '[]'$  Continue current frequency toward long and short term goals.    '[x]'$  Specific Instructions for subsequent treatments: Continue tx per POC      Time In: 9:00 am           Time Out: 10:00 am    Electronically  signed by:  Burney Gauze, PTA

## 2019-06-28 ENCOUNTER — Inpatient Hospital Stay: Admit: 2019-06-28 | Payer: PRIVATE HEALTH INSURANCE | Primary: Internal Medicine

## 2019-06-28 NOTE — Other (Signed)
[]  [facility] &  Therapy  [address].  [phone]  F: [phone] []  [place]  [address]   [address]  P: [phone]  F: [phone] [x]  [place] &  Therapy  [address]  P: [phone]  F: [phone] []  [place]: [phone]  F: [phone] []  [place]  [place]   [address]   [place]: [phone]  F: [phone]      Physical Therapy Daily Treatment Note/ Progress Report    Date:  06/28/2019  Patient: Monica Wood                          DOB: December 17, 1975                      MRN: [id number]  Physician: Megan Salon, MD                            Insurance: [employer]  Medical Diagnosis: Pain in right shoulder                Rehab Codes: M25.511  Onset Date: 04/11/2019                  Next Dr.'s appt.: --  Visit# / total visits: 15/12    Cancels/No Shows: 0/0    Subjective: Pt reports continue soreness in distal bicep tendon and head of the humerus with pain into abd.      Pain:  [x]  Yes  []  No  Location: R shoulder  Pain Rating: (0-10 scale) 2/10  Pain altered Tx:  [x]  No  []  Yes  Action:  Comments:     Today???s Treatment:                                        Exercises:  Exercise  R Shoulder Reps/ Time Weight/ Level Comments today   UBE 6' L9  x   UT S 3x30" ??  x   Levator S 3x30" ??  x   Doorway pec S 3x30" ??  x   Doorway Rhomboid S 3x30" ??     Sleeper stretch 3x30" 1#     ER stretch supine 3x30" 2#     Doorway bicep stretch 3x30"      Wand ER stretch standing 3x30"      Eccentric biceps curls 2x10 4#     Prone: ?? ??     Retraction 2x15, 5"?? ?? HEP    Depression 2x15, 5" ?? HEP    W's 15x, 5"      ITY's 2x10             Wall push-up plus 2x12      ??SA punches 2x10 ??     ??TB rows 2x10 lime??     ??TB  shoulder extension 2x10 lime     ??HAB on wall ??2x10 lime??     Sleeper stretch 3x30"?? ??     tband ER w/ retraction  2x10 lime            SL ER 2x10 1#     SL HAB 2x10  To painful    SL abduction 2x10 1#     Wall Surrenders 2x10, 5"   x   Wall walks x10 Orange Up/down, lateral x                 Other: Myofascial release to proximal R biceps, short head biceps mucle belly with Hawk Grips, hypervolt to [employer], and lateral delt, DI to pec and lateral delt        Treatment Charges: Mins Units   []   Modalities     [x]   Ther Exercise 15 1   [x]   Manual Therapy 25 2   []   Ther Activities     []   Aquatics     [x]   Vasocompression PRN 0   []   Other     Total Treatment time 45 3       Assessment: [x]  Progressing toward goals. Initiated with UBE followed by continued manual as listed above with fair release. Noted increased tension in L pec vs right and continued tenderness in proximal bicep and R deltoid. Held vaso this date for trial, use as PRN continuing forward.       []  No change.     []  Other:  [x]  Patient would continue to benefit from skilled physical therapy services in order to:     STG: (to be met in 6 treatments)  1. ? Pain: Pt to decrease pain levels to 5/10 with all activities to ease ADLs.: ONGOING, still up to 7-8/10 (06/13/19)  2. ? ROM: Increase flexibility in cervical spine and R shoulder to improve alignment for better motion mechanics and to help decrease pain.: ONGOING, pt still self-reports tightness, and still presents with tightness in R UT, R pecs (06/13/19)  3. Pt will self-report ability to sleep without being woken up by R shoulder pain, allowing pt to be better rested.: ONGOING, still has pain with sleeping (06/13/19)  4. Independent with Home Exercise Programs: MET (06/13/19)  ??  LTG: (to be met in 12 treatments)  1. Improve score of UEFS from 45% impaired to <20% impaired to allow for better ability to use R arm without pain.: ONGOING, pt scored 35% impaired (06/13/19)  2. ? Strength: Pt will  demonstrate 5/5 scapular stabilizer strength, allowing for better posture and R arm mechanics with overhead motion.: ONGOING, pt still with UT compensations (06/13/19)  3. ? Function: Pt will be able to demonstrate ability to lift R arm overhead with no compensations with UT.: ONGOING, still compensates with UT (06/13/19)  4. Decrease pain to 0/10: ONGOING, still up to 7-8/10 (06/13/19)  ??  Patient goals: "Healing and info on how to prevent injury in future"    Pt. Education:  [x]  Yes  []  No  [x]  Reviewed Prior HEP/Ed  Method of Education: [x]  Verbal  [x]  Demo  []  Written  Comprehension of Education:  [x]  Verbalizes understanding.  [x]  Demonstrates understanding.  [x]  Needs review.  []  Demonstrates/verbalizes HEP/Ed previously given.     Plan: []  Continue current frequency toward long and short term goals.    [x]  Specific Instructions for subsequent treatments: Continue tx per POC      Time In: 4:01pm           Time Out: 4:56pm    Electronically signed by:  Gwyndolyn Kaufman, PTA

## 2019-07-01 ENCOUNTER — Inpatient Hospital Stay: Admit: 2019-07-01 | Discharge: 2019-07-06 | Payer: PRIVATE HEALTH INSURANCE | Primary: Internal Medicine

## 2019-07-01 NOTE — Other (Addendum)
[]  [place]  [address]  [phone]  F: [phone] []  [place]  [address]   [address]  P: [phone]  F: [phone] [x]  [place]  [place]  P: [phone]  F: [phone] []  [place]  P: [phone]  F: [phone] []  [place]  [place]   [address]   [place]: [phone]  F: [phone]      Physical Therapy Daily Treatment Note    Date:  07/01/2019  Patient: Monica Wood                          DOB: 08/16/1975                      MRN: [id number]  Physician: Megan Salon, MD                            Insurance: [employer]  Medical Diagnosis: Pain in right shoulder                Rehab Codes: M25.511  Onset Date: 04-15-19                  Next Dr.'s appt.: --  Visit# / total visits: 16/24    Cancels/No Shows: 0/0    Subjective: Pt presents to PT stating that she is feeling okay today, however states that she was in increased pain the last few days as she believes that she may have slept wrong.      Pain:  [x]  Yes  []  No  Location: R shoulder  Pain Rating: (0-10 scale) 2/10  Pain altered Tx:  [x]  No  []  Yes  Action:  Comments:     Today???s Treatment:                                        Exercises:  Exercise  R Shoulder Reps/ Time Weight/ Level Comments today   UBE 6'   x   UT S 3x30" ??  x   Levator S 3x30" ??  x   Doorway pec S 3x30" ??  x   Doorway Rhomboid S 3x30" ??     Sleeper stretch 3x30" 1#     ER stretch supine 3x30" 2#     Doorway bicep stretch 3x30"      Wand ER stretch standing 3x30"      Eccentric biceps curls 2x10 4#     Prone: ?? ??     Retraction 2x15, 5"?? ?? HEP    Depression 2x15, 5" ?? HEP    W's 15x, 5"      ITY's 2x10             Wall push-up plus 2x12       ??SA punches 2x10 ??     ??TB rows 2x10 lime??     ??TB shoulder extension 2x10 lime     ??HAB on wall ??2x10 lime??  Sleeper stretch 3x30"?? ??     tband ER w/ retraction 2x10 lime            SL ER 2x10 1#     SL HAB 2x10  To painful    SL abduction 2x10 1#     Wall Surrenders 2x10, 5"   x   Wall walks x10 Orange Up/down, lateral x   Wall push ups 2x10   x   T-band Horiz. abd 2x10 Orange Looped band, focus on no compensations x   Other: Myofascial release to R lateral UT with [employer] Charges: Mins Units   []   Modalities     [x]   Ther Exercise 31 2   [x]   Manual Therapy 12 1   []   Ther Activities     []   Aquatics     [x]   Vasocompression 15 1   []   Other     Total Treatment time 58 4       Assessment: [x]  Progressing toward goals. Needed cues and consistent feedback for proper technique to avoid UT compensation throughout most of exercises completed. Added wall push ups and t-band horiz. abd with good pt tolerance. Pt does have some discomfort in shoulder with added exercises, however once cued with proper shoulder positioning, reports that the pain/discomofrt is better. Continued with manual to keep progressing flexibility. Ended with vaso to help with inflammation.    []  No change.     []  Other:  [x]  Patient would continue to benefit from skilled physical therapy services in order to:     STG: (to be met in 6 treatments)  1. ? Pain: Pt to decrease pain levels to 5/10 with all activities to ease ADLs.: ONGOING, still up to 7-8/10 (06/13/19)  2. ? ROM: Increase flexibility in cervical spine and R shoulder to improve alignment for better motion mechanics and to help decrease pain.: ONGOING, pt still self-reports tightness, and still presents with tightness in R UT, R pecs (06/13/19)  3. Pt will self-report ability to sleep without being woken up by R shoulder pain, allowing pt to be better rested.: ONGOING, still has pain with sleeping (06/13/19)  4. Independent with Home Exercise Programs: MET  (06/13/19)  ??  LTG: (to be met in 12 treatments)  1. Improve score of UEFS from 45% impaired to <20% impaired to allow for better ability to use R arm without pain.: ONGOING, pt scored 35% impaired (06/13/19)  2. ? Strength: Pt will demonstrate 5/5 scapular stabilizer strength, allowing for better posture and R arm mechanics with overhead motion.: ONGOING, pt still with UT compensations (06/13/19)  3. ? Function: Pt will be able to demonstrate ability to lift R arm overhead with no compensations with UT.: ONGOING, still compensates with UT (06/13/19)  4. Decrease pain to 0/10: ONGOING, still up to 7-8/10 (06/13/19)  ??  Patient goals: "Healing and info on how to prevent injury in future"    Pt. Education:  [x]  Yes  []  No  [x]  Reviewed Prior HEP/Ed  Method of Education: [x]  Verbal  [x]  Demo  []  Written  Comprehension of Education:  [x]  Verbalizes understanding.  [x]  Demonstrates understanding.  [x]  Needs review.  []  Demonstrates/verbalizes HEP/Ed previously given.     Plan: [x]  Continue current frequency toward long and short term goals.    [x]  Specific Instructions for subsequent treatments: Continue tx per POC      Time In: 1200  Time Out: 1301    Electronically signed by:  Deeann Dowse, PT

## 2019-07-05 ENCOUNTER — Inpatient Hospital Stay: Admit: 2019-07-05 | Discharge: 2019-07-05 | Payer: PRIVATE HEALTH INSURANCE | Primary: Internal Medicine

## 2019-07-05 NOTE — Other (Signed)
[]  [facility] - [facility] &  Therapy  [address]  [phone]  F: [phone] []  [place]  [address]   [address]  P: [phone]  F: [phone] [x]  [place]  [place]  P: [phone]  F: [phone] []  [place]: [phone]  F: [phone] []  [place]  [place]   [address]   [place]: [phone]  F: [phone]      Physical Therapy Daily Treatment Note/ Progress Report    Date:  07/05/2019  Patient: Monica Wood                          DOB: 11-06-1975                      MRN: [id number]  Physician: Megan Salon, MD                            Insurance: [employer]  Medical Diagnosis: Pain in right shoulder                Rehab Codes: M25.511  Onset Date: 2019/04/14                  Next Dr.'s appt.: --  Visit# / total visits: 17/24    Cancels/No Shows: 0/0    Subjective: pt arrived reporting no pain in R shoulder this date. States she has been sleeping on couch which seems to be helping due to not being able to roll over on shoulder.       Pain:  []  Yes  [x]  No  Location: R shoulder  Pain Rating: (0-10 scale) 0/10  Pain altered Tx:  [x]  No  []  Yes  Action:  Comments:     Today???s Treatment:                                        Exercises:  Exercise  R Shoulder Reps/ Time Weight/ Level Comments today   UBE 6' L9  x   UT S 3x30" ??  x   Levator S 3x30" ??  x   Doorway pec S 3x30" ??  x   Doorway Rhomboid S 3x30" ??     Sleeper stretch 3x30" 1#     ER stretch supine 3x30" 2#     Doorway bicep stretch 3x30"      Wand ER stretch standing 3x30"      Eccentric biceps curls 2x10 4#     Prone: ?? ??     Retraction 2x15, 5"?? ?? HEP    Depression 2x15, 5" ?? HEP    W's 15x, 5"      ITY's 2x10             Wall  push-up plus 2x12      ??SA punches 2x10 ??     ??TB rows 2x10 lime??     ??TB shoulder extension 2x10 lime     ??HAB on wall ??2x10 lime??  Sleeper stretch 3x30"?? ??     tband ER w/ retraction 2x10 lime            SL ER 2x10 1#     SL HAB 2x10  To painful    SL abduction 2x10 1#     Wall Surrenders 2x10, 5"   x   Wall walks x10 lime Up/down, lateral x   Lat machine scap depressions  2x10   x   Pull aparts 2x10 lime  x   Other: Myofascial release to proximal R biceps, short head biceps mucle belly with Hawk Grips, hypervolt to [employer], and lateral delt, DI to pec and lateral delt, cupping to bicep groove lateral delt         Treatment Charges: Mins Units   []   Modalities     [x]   Ther Exercise 25 2   [x]   Manual Therapy 20 1   []   Ther Activities     []   Aquatics     [x]   Vasocompression PRN 0   []   Other     Total Treatment time 45 3       Assessment: [x]  Progressing toward goals. Continued with stretches and exercises per log with good tolerance. Added in lat machine scap depression, pull aparts, as well as increased resistance for exercises. Pt noting muscle fatigue post treatment but no increased pain. Continue to progress as tol.       []  No change.     []  Other:  [x]  Patient would continue to benefit from skilled physical therapy services in order to:     STG: (to be met in 6 treatments)  1. ? Pain: Pt to decrease pain levels to 5/10 with all activities to ease ADLs.: ONGOING, still up to 7-8/10 (06/13/19)  2. ? ROM: Increase flexibility in cervical spine and R shoulder to improve alignment for better motion mechanics and to help decrease pain.: ONGOING, pt still self-reports tightness, and still presents with tightness in R UT, R pecs (06/13/19)  3. Pt will self-report ability to sleep without being woken up by R shoulder pain, allowing pt to be better rested.: ONGOING, still has pain with sleeping (06/13/19)  4. Independent with Home Exercise Programs: MET (06/13/19)  ??  LTG: (to be met in 12 treatments)  1. Improve  score of UEFS from 45% impaired to <20% impaired to allow for better ability to use R arm without pain.: ONGOING, pt scored 35% impaired (06/13/19)  2. ? Strength: Pt will demonstrate 5/5 scapular stabilizer strength, allowing for better posture and R arm mechanics with overhead motion.: ONGOING, pt still with UT compensations (06/13/19)  3. ? Function: Pt will be able to demonstrate ability to lift R arm overhead with no compensations with UT.: ONGOING, still compensates with UT (06/13/19)  4. Decrease pain to 0/10: ONGOING, still up to 7-8/10 (06/13/19)  ??  Patient goals: "Healing and info on how to prevent injury in future"    Pt. Education:  [x]  Yes  []  No  [x]  Reviewed Prior HEP/Ed  Method of Education: [x]  Verbal  [x]  Demo  []  Written  Comprehension of Education:  [x]  Verbalizes understanding.  [x]  Demonstrates understanding.  [x]  Needs review.  []  Demonstrates/verbalizes HEP/Ed previously given.     Plan: []  Continue current frequency toward long and short term goals.    [x]  Specific Instructions for subsequent treatments: Continue tx per POC      Time In: 1:00 pm  Time Out: 1:50 pm    Electronically signed by:  Elta Guadel[place], PTA

## 2019-07-06 ENCOUNTER — Encounter: Primary: Internal Medicine

## 2019-07-06 ENCOUNTER — Ambulatory Visit: Payer: PRIVATE HEALTH INSURANCE | Primary: Internal Medicine

## 2019-07-11 ENCOUNTER — Inpatient Hospital Stay: Admit: 2019-07-11 | Discharge: 2019-07-11 | Payer: PRIVATE HEALTH INSURANCE | Primary: Internal Medicine

## 2019-07-11 LAB — RUBELLA ANTIBODY, IGG: Rubella Antibody, IgG: 28.3 IU/mL

## 2019-07-11 NOTE — Other (Signed)
[]  [facility] &  Therapy  [address]  [phone]  F: [phone] []  [place]  [address]   [address]  P: [phone]  F: [phone] [x]  [place]  [place]  P: [phone]  F: [phone] []  [place]: [phone]  F: [phone] []  [place]  [place]   [address]   [place]: [phone]  F: [phone]      Physical Therapy Daily Treatment Note/ Progress Report    Date:  07/11/2019  Patient: Monica Wood                          DOB: March 28, 1976                      MRN: [id number]  Physician: Megan Salon, MD                            Insurance: [employer]  Medical Diagnosis: Pain in right shoulder                Rehab Codes: M25.511  Onset Date: 2019-04-16                  Next Dr.'s appt.: --  Visit# / total visits: 18/24    Cancels/No Shows: 0/0    Subjective: Pt reports to PT with no pian. Pt states that she has been trying to sleep in her bed over the last few nights and felt that her shoulder has started to bother her again, so plans to start sleeping on couch again.  Pain:  []  Yes  [x]  No  Location: R shoulder  Pain Rating: (0-10 scale) 0/10  Pain altered Tx:  [x]  No  []  Yes  Action:  Comments:     Today???s Treatment:                                        Exercises:  Exercise  R Shoulder Reps/ Time Weight/ Level Comments today   UBE 6' L9  x   UT S 3x30" ??  x   Levator S 3x30" ??  x   Doorway pec S 3x30" ??  x   Doorway Rhomboid S 3x30" ??     Sleeper stretch 3x30" 1#     ER stretch supine 3x30" 2#     Doorway bicep stretch 3x30"      Wand ER stretch standing 3x30"      Eccentric biceps curls 2x10 4#     Prone: ?? ??     Retraction 2x15, 5"?? ?? HEP    Depression 2x15, 5" ?? HEP    W's  15x, 5"      ITY's 2x10             Wall push-up plus 2x12      ??SA punches 2x10 ??     ??TB rows 2x10 lime??     ??TB shoulder extension 2x10 lime     ??  HAB on wall ??2x10 lime??     Sleeper stretch 3x30"?? ??     tband ER w/ retraction 2x10 lime            SL ER 2x10 1#     SL HAB 2x10  To painful    SL abduction 2x10 1#     Wall Surrenders 2x10, 5"      Wall walks x10 lime Up/down, lateral    Lat machine scap depressions  2x10      Pull aparts 2x10 lime     Other: DI R pec major, R lat  Hypervolt R deltoid        Treatment Charges: Mins Units   []   Modalities     [x]   Ther Exercise 11 1   [x]   Manual Therapy 33 2   []   Ther Activities     []   Aquatics     [x]   Vasocompression 15 1   []   Other     Total Treatment time 59 4       Assessment: [x]  Progressing toward goals. Started treatment with UBE and stretches to work on mobility and warm up for treatment. Focused today's treatment on manual as reported above as pt still with tightness in R shoulder girdle. Pt very tender throughout but tolerates completion. Following completion pt reports increased shoulder soreness, especially along poster shoulder blade. Ended treatment with vaso for soreness/inflammation.    []  No change.     []  Other:  [x]  Patient would continue to benefit from skilled physical therapy services in order to:     STG: (to be met in 6 treatments)  1. ? Pain: Pt to decrease pain levels to 5/10 with all activities to ease ADLs.: ONGOING, still up to 7-8/10 (06/13/19)  2. ? ROM: Increase flexibility in cervical spine and R shoulder to improve alignment for better motion mechanics and to help decrease pain.: ONGOING, pt still self-reports tightness, and still presents with tightness in R UT, R pecs (06/13/19)  3. Pt will self-report ability to sleep without being woken up by R shoulder pain, allowing pt to be better rested.: ONGOING, still has pain with sleeping (06/13/19)  4. Independent with Home Exercise Programs: MET (06/13/19)  ??  LTG: (to be met in 12  treatments)  1. Improve score of UEFS from 45% impaired to <20% impaired to allow for better ability to use R arm without pain.: ONGOING, pt scored 35% impaired (06/13/19)  2. ? Strength: Pt will demonstrate 5/5 scapular stabilizer strength, allowing for better posture and R arm mechanics with overhead motion.: ONGOING, pt still with UT compensations (06/13/19)  3. ? Function: Pt will be able to demonstrate ability to lift R arm overhead with no compensations with UT.: ONGOING, still compensates with UT (06/13/19)  4. Decrease pain to 0/10: ONGOING, still up to 7-8/10 (06/13/19)  ??  Patient goals: "Healing and info on how to prevent injury in future"    Pt. Education:  [x]  Yes  []  No  [x]  Reviewed Prior HEP/Ed  Method of Education: [x]  Verbal  [x]  Demo  []  Written  Comprehension of Education:  [x]  Verbalizes understanding.  [x]  Demonstrates understanding.  [x]  Needs review.  []  Demonstrates/verbalizes HEP/Ed previously given.     Plan: [x]  Continue current frequency toward long and short term goals.    [x]  Specific Instructions for subsequent treatments: Continue tx per POC      Time In: 1500  Time Out: 1601    Electronically signed by:  Deeann Dowse, PT

## 2019-07-13 ENCOUNTER — Inpatient Hospital Stay: Admit: 2019-07-13 | Discharge: 2019-07-13 | Payer: PRIVATE HEALTH INSURANCE | Primary: Internal Medicine

## 2019-07-13 LAB — VARICELLA ZOSTER ANTIBODY, IGG: Varicella Zoster Ab IgG: 2.29 (ref >1.09)

## 2019-07-13 LAB — RUBEOLA ANTIBODY IGG: MEASLES ANTIBODY IGG: 3.29 (ref >1.09)

## 2019-07-13 LAB — MUMPS ANTIBODY, IGG: Mumps IgG: 1.53 (ref >1.09)

## 2019-07-13 NOTE — Other (Signed)
[]  [facility]  [facility]  [address]  [phone]  F: [phone] []  [place]  [address]   [address]  P: [phone]  F: [phone] [x]  [place]  [place]  P: [phone]  F: [phone] []  [place]: [phone]  F: [phone] []  [place]  [place]   [address]   [place]: [phone]  F: [phone]      Physical Therapy Daily Treatment Note    Date:  07/13/2019  Patient: Monica Wood                          DOB: March 07, 1976                      MRN: [id number]  Physician: Megan Salon, MD                            Insurance: [employer]  Medical Diagnosis: Pain in right shoulder                Rehab Codes: M25.511  Onset Date: Apr 14, 2019                  Next Dr.'s appt.: --  Visit# / total visits: 19/24    Cancels/No Shows: 0/0    Subjective: Pt presents to PT with no changes since last treatment, however does states that she was bruised following manual completion last treatment. Per pt she was able to sleep in bed last night without waking up d/t pain, however she did take muscle relaxers and a sleeping pill, so she is unsure if this is why she slept so well.  Pain:  []  Yes  [x]  No  Location: R shoulder  Pain Rating: (0-10 scale) 0/10  Pain altered Tx:  [x]  No  []  Yes  Action:  Comments:     Today???s Treatment:                                        Exercises:  Exercise  R Shoulder Reps/ Time Weight/ Level Comments today   UBE 6' L9  x   UT S 3x30" ??  x   Levator S 3x30" ??  x   Doorway pec S 3x30" ??  x   Doorway Rhomboid S 3x30" ??  x   Sleeper stretch 3x30" 1#  x   ER stretch supine 3x30" 2#     Doorway bicep stretch 3x30"      Wand ER stretch standing 3x30"      Eccentric biceps  curls 2x10 4#     Prone: ?? ??     Retraction 2x15, 5"?? ?? HEP    Depression 2x15, 5" ?? HEP    W's 2x15, 5"   x   ITY's 2x10             Wall push-up plus 2x12      ??SA punches 2x10 ??     ??TB  rows 2x10 lime??     ??TB shoulder extension 2x10 lime     ??HAB on wall ??2x10 lime??     Sleeper stretch 3x30"?? ??     tband ER w/ retraction 2x10 lime            SL ER 2x10 2#  x   SL HAB 2x10 1#  x   SL abduction 2x10 2#  x   Wall Surrenders 2x10, 5"      Wall walks 2x10 lime Up/down, lateral x   Lat machine scap depressions  2x10      Pull aparts x15 lime  x   Other: DI R pec major, R lat; Hypervolt R deltoid- Held today        Treatment Charges: Mins Units   []   Modalities     [x]   Ther Exercise 41 3   [x]   Manual Therapy     []   Ther Activities     []   Aquatics     [x]   Vasocompression 15 1   []   Other     Total Treatment time 56 4       Assessment: [x]  Progressing toward goals. Focused today's treatment on exercises d/t continued tenderness and soreness following manual completed last treatment. Able to increase weight with SL exercises today with pt reporting some pain during, but after educating on proper form, reports no more pain, only difficulty. Also able to increase reps with wall walks and reps during set with pull aparts. During completion, pt does report some pain in anterior R shoulder, however with cueing to pinch shoulder blades, pt reports better tolerance, but increased difficulty. Ended treatment with vaso for soreness.    []  No change.     []  Other:  [x]  Patient would continue to benefit from skilled physical therapy services in order to:     STG: (to be met in 6 treatments)  1. ? Pain: Pt to decrease pain levels to 5/10 with all activities to ease ADLs.: ONGOING, still up to 7-8/10 (06/13/19)  2. ? ROM: Increase flexibility in cervical spine and R shoulder to improve alignment for better motion mechanics and to help decrease pain.: ONGOING, pt still self-reports tightness, and still presents with tightness in R  UT, R pecs (06/13/19)  3. Pt will self-report ability to sleep without being woken up by R shoulder pain, allowing pt to be better rested.: ONGOING, still has pain with sleeping (06/13/19)  4. Independent with Home Exercise Programs: MET (06/13/19)  ??  LTG: (to be met in 12 treatments)  1. Improve score of UEFS from 45% impaired to <20% impaired to allow for better ability to use R arm without pain.: ONGOING, pt scored 35% impaired (06/13/19)  2. ? Strength: Pt will demonstrate 5/5 scapular stabilizer strength, allowing for better posture and R arm mechanics with overhead motion.: ONGOING, pt still with UT compensations (06/13/19)  3. ? Function: Pt will be able to demonstrate ability to lift R arm overhead with no compensations with UT.: ONGOING, still compensates with UT (06/13/19)  4. Decrease pain to 0/10: ONGOING, still up to 7-8/10 (06/13/19)  ??  Patient goals: "Healing and info on how to prevent injury in future"    Pt. Education:  [x]  Yes  []  No  [x]  Reviewed Prior HEP/Ed  Method of Education: [x]  Verbal  [x]  Demo  []  Written  Comprehension of Education:  [x]  Verbalizes understanding.  [x]  Demonstrates understanding.  [x]  Needs review.  []  Demonstrates/verbalizes  HEP/Ed previously given.     Plan: [x]  Continue current frequency toward long and short term goals.    [x]  Specific Instructions for subsequent treatments: Continue tx per POC      Time In: 1503          Time Out: 1559    Electronically signed by:  Deeann Dowse, PT

## 2019-07-21 LAB — T-SPOT TB TEST

## 2019-08-22 NOTE — Discharge Summary (Signed)
[place]  [place]  Phone: [phone]  Fax: [phone]      Physical Therapy Discharge Note    Date: 08/22/2019      Patient: Monica Wood  DOB: 1976/03/20  MRN: [id number]    Physician:??Megan Salon, MD????????????????????????????????????????????????????                Medical Diagnosis:??Pain in right shoulder??????????????????????????????     Rehab Codes:??M25.511  Onset Date:??04/20/2019??????????????????????????????                             Visit# / total visits: 19                                            Cancels/No Shows: 0/0  ??Date of initial visit: 05/06/19                   '[]'$  Patient recovered from conditions. Treatment goals were met.  '[]'$  Patient received maximum benefit. No further therapy indicated at this time.  '[]'$  Patient demonstrated improvement from condition with  ** Of  ** Short term goals met.  '[]'$ Patient demonstrated improvement from condition with **   Of **  Long term goals met.  '[]'$  Patient to continue exercise/home instructions independently.  '[]'$  Therapy interrupted due to:    '[]'$  Patient has 2 or more no shows/cancels, is discontinued per our policy.    '[]'$  Patient has completed prescribed number of treatment sessions.    '[x]'$  Other: Pt started a new job with a new schedule, did not make additional visits following change      Pain level at evaluation was       4-5/10 and at discharge was       Unknown/10    It Is My Understanding That The:  '[]'$  Patient returned to work.  '[x]'$  Patient demonstrated improved level of function.  '[]'$  Patient returned to previous functional level.  '[]'$  Patient's current functional status is unknown due to no-shows  '[]'$  Other:     Recommendations/Comments:                   Treatment Included:     '[x]'$  Therapeutic Exercise   97110  '[]'$  Iontophoresis: 4 mg/mL Dexamethasone Sodium Phosphate 40-120 mAmin  97033   '[]'$  Therapeutic Activity  97530 '[x]'$  Vasopneumatic cold with compression  97016    '[]'$  Gait Training   97116 '[]'$  Ultrasound   97035   '[]'$  Neuromuscular  Re-education  97112 '[]'$  Electrical Stimulation Unattended  97014   '[x]'$  Manual Therapy  97140 '[]'$  Electrical Stimulation Attended  97032   '[x]'$  Instruction in HEP  '[]'$  Lumbar/Cervical Traction  97012   '[]'$  Aquatic Therapy   97113 '[]'$  Cold/hotpack    '[]'$  Massage   97124      '[]'$  Dry Needling, 1 or 2 muscles  20560   '[]'$  Biofeedback, first 15 minutes   90912  '[]'$  Biofeedback, additional 15 minutes   90913 '[]'$  Dry Needling, 3 or more muscles  20561                If you have any questions or concerns regarding this patient's care, please contact us.   Thank you for your referral.      Electronically signed by: Deeann Dowse,  PT

## 2019-08-23 ENCOUNTER — Inpatient Hospital Stay: Payer: PRIVATE HEALTH INSURANCE | Primary: Internal Medicine

## 2020-03-15 ENCOUNTER — Ambulatory Visit
Admit: 2020-03-15 | Discharge: 2020-03-15 | Payer: PRIVATE HEALTH INSURANCE | Attending: Orthopaedic Surgery | Primary: Internal Medicine

## 2020-03-15 ENCOUNTER — Ambulatory Visit: Admit: 2020-03-15 | Discharge: 2020-03-15 | Payer: PRIVATE HEALTH INSURANCE | Primary: Internal Medicine

## 2020-03-15 DIAGNOSIS — M545 Low back pain, unspecified: Secondary | ICD-10-CM

## 2020-03-15 NOTE — Progress Notes (Signed)
Patient ID: Monica Wood is a 44 y.o. female    Chief Compliant:  No chief complaint on file.       Diagnostic imaging:    AP lateral lumbar spine less than expected degenerative changes normal hips    Assessment and Plan:  1. Low back pain, unspecified back pain laterality, unspecified chronicity, unspecified whether sciatica present    2. Fibromyalgia      Patient's pain at this time is mostly consistent with fibromyalgia low back pain      Acute intermittent low back pain some chronic low back pain    PT    We will consider ordering MRI if patient does not improve    Follow up in 6 weeks    HPI:  This is a 44 y.o. female who presents to the clinic today as a new patient for lower back evaluation.     Patient with about 12 year history of acute intermittent lower back pain.    She has been regularly following a home exercise program provided by PT 8 years ago.    She received trigger point injections in her shoulders and hips in May with good relief of her pain.    Hx of fibromyalgia, IBS, irritable bladder, insomnia. She is managing her pain with Tramadol, ibuprofen and meloxicam    Review of Systems   All other systems reviewed and are negative.      Past History:  No current outpatient medications on file.  Not on File  Social History     Socioeconomic History   ??? Marital status: Married     Spouse name: Not on file   ??? Number of children: Not on file   ??? Years of education: Not on file   ??? Highest education level: Not on file   Occupational History   ??? Not on file   Tobacco Use   ??? Smoking status: Not on file   Substance and Sexual Activity   ??? Alcohol use: Not on file   ??? Drug use: Not on file   ??? Sexual activity: Not on file   Other Topics Concern   ??? Not on file   Social History Narrative   ??? Not on file     Social Determinants of Health     Financial Resource Strain:    ??? Difficulty of Paying Living Expenses:    Food Insecurity:    ??? Worried About Programme researcher, broadcasting/film/video in the Last Year:    ??? Barista  in the Last Year:    Transportation Needs:    ??? Freight forwarder (Medical):    ??? Lack of Transportation (Non-Medical):    Physical Activity:    ??? Days of Exercise per Week:    ??? Minutes of Exercise per Session:    Stress:    ??? Feeling of Stress :    Social Connections:    ??? Frequency of Communication with Friends and Family:    ??? Frequency of Social Gatherings with Friends and Family:    ??? Attends Religious Services:    ??? Database administrator or Organizations:    ??? Attends Engineer, structural:    ??? Marital Status:    Intimate Programme researcher, broadcasting/film/video Violence:    ??? Fear of Current or Ex-Partner:    ??? Emotionally Abused:    ??? Physically Abused:    ??? Sexually Abused:      No past medical history on file.  No past surgical history on file.  No family history on file.     Physical Exam:  Vitals signs and nursing note reviewed.   Constitutional:       Appearance: well-developed.   HENT:      Head: Normocephalic and atraumatic.      Nose: Nose normal.   Eyes:      Conjunctiva/sclera: Conjunctivae normal.   Neck:      Musculoskeletal: Normal range of motion and neck supple.   Pulmonary:      Effort: Pulmonary effort is normal. No respiratory distress.   Musculoskeletal:      Comments: Normal gait     Skin:     General: Skin is warm and dry.   Neurological:      Mental Status: Alert and oriented to person, place, and time.      Sensory: No sensory deficit.   Psychiatric:         Behavior: Behavior normal.         Thought Content: Thought content normal.    Physical exam normal gait some tenderness over trochanteric bursa multiple positive trigger point tenderness consistent with fibromyalgia    No significant dysesthesias numbness tingling weakness    Provider Attestation:  Shirlee More Jahsiah Carpenter, personally performed the services described in this documentation. All medical record entries made by the scribe were at my direction and in my presence. I have reviewed the chart and discharge instructions and agree that the records  reflect my personal performance and is accurate and complete. Cliffton Asters Dion Body, MD 03/15/20     Scribe Attestation:  By signing my name below, I, Dorise Hiss, attest that this documentation has been prepared under the direction and in the presence of Dr. Carlean Purl. Electronically signed: Dorise Hiss, Scribe, 03/15/20     Please note that this chart was generated using voice recognition Dragon dictation software.  Although every effort was made to ensure the accuracy of this automated transcription, some errors in transcription may have occurred.

## 2020-03-21 ENCOUNTER — Inpatient Hospital Stay: Admit: 2020-03-21 | Discharge: 2020-03-21 | Payer: PRIVATE HEALTH INSURANCE | Primary: Internal Medicine

## 2020-03-21 ENCOUNTER — Ambulatory Visit
Admit: 2020-03-21 | Discharge: 2020-03-21 | Payer: PRIVATE HEALTH INSURANCE | Attending: Orthopaedic Surgery | Primary: Internal Medicine

## 2020-03-21 ENCOUNTER — Ambulatory Visit: Admit: 2020-03-21 | Discharge: 2020-03-21 | Payer: PRIVATE HEALTH INSURANCE | Primary: Internal Medicine

## 2020-03-21 ENCOUNTER — Ambulatory Visit: Payer: PRIVATE HEALTH INSURANCE | Primary: Internal Medicine

## 2020-03-21 DIAGNOSIS — M545 Low back pain: Secondary | ICD-10-CM

## 2020-03-21 DIAGNOSIS — M7582 Other shoulder lesions, left shoulder: Secondary | ICD-10-CM

## 2020-03-21 MED ORDER — DICLOFENAC SODIUM 75 MG PO TBEC
75 MG | ORAL_TABLET | Freq: Two times a day (BID) | ORAL | 0 refills | Status: DC
Start: 2020-03-21 — End: 2020-04-18

## 2020-03-21 NOTE — Progress Notes (Signed)
Orthopedic Shoulder Encounter Note     Chief complaint: bilateral shoulder pain    HPI: Monica Wood is a 44 y.o.  left-hand dominant female who presents for evaluation of both her shoulders.  She indicates that she has been dealing with pain especially in the right side for about a year now without precipitating trauma or injury.  It is primarily localized to the superior and anterior aspect of the shoulder.  It does not radiate and it is not associate with any numbness or tingling.  She does have some weakness and stiffness in both shoulders.  Her pain does not radiate.  It does bother her at night.    Previous treatment:    NSAIDs: Aleve    Physical Therapy: Yes, a year ago.  This helped.    Injections: She believes she is had 2 cortisone injections to both shoulders the most recent of which was administered by Dr. Ninfa Linden at the [facility] in May 2021    Surgeries: None    Review of Systems:   Constitutional: Negative for fever, chills, sweats.   Pain Score:   4  Neurological: Negative for headache, numbness, or weakness.   Musculoskeletal: As noted in HPI     Past Medical History  Monica Wood  has no past medical history on file.    Past Surgical History  Monica Wood  has no past surgical history on file.    Current Medications  Current Outpatient Medications   Medication Sig Dispense Refill   ??? traMADol (ULTRAM) 50 MG tablet Take 50 mg by mouth every 8 hours as needed.     ??? vitamin D3 (CHOLECALCIFEROL) 10 MCG (400 UNIT) TABS tablet Take 400 Units by mouth daily     ??? Calcium Carbonate-Vitamin D (OYSTER SHELL CALCIUM/D) 500-200 MG-UNIT TABS Take 1 tablet by mouth 2 times daily (with meals)     ??? ibuprofen (IBUPROFEN 100 JUNIOR STRENGTH) 100 MG chewable tablet ibuprofen       No current facility-administered medications for this visit.       Allergies  Allergies have been reviewed.  Jeanet is allergic to codeine.    Social History  [place]      Family History  Ginny's family history is not on file.     Physical  Exam:     Resp 12    Ht 5\' 4"  (1.626 m)    Wt 230 lb (104.3 kg)    BMI 39.48 kg/m??    Constitutional: Patient is oriented to person, place, and time. Patient appears well-developed and well nourished.   Mental Status/psychiatric: Behavior is normal. Thought content normal.  HENT: Negative otherwise noted  Head: Normocephalic and Atraumatic  Nose: Normal  Respiratory/Cardio: Effort normal. No respiratory distress.  Gait: normal    Shoulder:    Skin: Skin is warm and dry; no swelling or obvious muscular atrophy.   Vasculature: 2+ radial pulses bilaterally  Neuro: Sensation grossly intact to light touch diffusely  Tenderness: Tender to palpation over the anterior and anterolateral corners of bilateral shoulders    ROM: (Degrees)    Right   A P   Left   A P    Elevation  150    Elevation  150   Abduction  150    Abduction  150    ER   80    ER   80   IR   T12    IR   T12   90 abd/ER  90 abd/ER     90 abd/IR      90 abd/IR     Crepitation  No    Crepitation No  Dyskenesia  No    Dyskenesia No      Muscle strength:    Right       Left    Deltoid   5    Deltoid   5  Supraspinatus  5    Supraspinatus  5  ER   5    ER   5  IR   5    IR   5    Special tests    Right   Rotator Cuff    Left    n   Painful arc    n   n   Pain with ER    n    n   Neer's     n    n   Hawkin's    n    n   Drop Arm    n  n   Lift off/Belly Press   n  n   ER Lag    n          AC Joint  n   AC tenderness   n  n   Cross-chest adduction  n       Labrum/biceps    y   O'Brien's    n   y   Chartered certified accountant    n      y   Speed's/Yergason's   y    y   Tenderness Biceps Groove  y    n   Popeye's    n         Instability  n   Ant Apprehension   n    n   Post Apprehension   n    n   Ant Load shift    n    n   Post Load shift   n   n   Sulcus     n  n   Generalized Laxity   n  n   Relocation test   n  n   Crank test     n  n   Antero-superior escape  n       Imaging:  Xrays: 4 views of the right shoulder obtained on 03/21/2020 were independently  reviewed  Indications: Right shoulder pain  Findings:  Normal glenohumeral and acromioclavicular joint spaces with a type 2 acromion.   Impression: Normal-appearing right shoulder radiographs    Xrays: 4 views of the left shoulder obtained on 03/21/2020 were independently reviewed  Indications: Left shoulder pain  Findings:  Normal glenohumeral and acromioclavicular joint spaces with a type 2 acromion.   Impression: Normal-appearing left shoulder radiographs    Impression/Plan:     Monica Wood is a 44 y.o. old female with bilateral shoulder pain that is consistent with rotator cuff and biceps tendinitis.  We discussed treatment options available to her, including nonoperative and operative intervention.  At this time I believe she will benefit from conservative management.  Consequently, a prescription for therapy was provided.  We also discussed use of a prescription NSAID versus a cortisone injection and at this time she has elected to proceed with a short course of a prescription NSAID.  To this end a prescription for Voltaren 75 mg to be taken twice a day with meals over the course of the next 2 weeks was  electronically sent to her pharmacy.  I anticipate improvement in resolution of her symptoms with this treatment and so I will see her back as needed but she was certainly encouraged to return or call at anytime with persistent or worsening symptoms and with any questions or concerns.    This note is created with the assistance of a speech recognition program.  While intending to generate adocument that actually reflects the content of the visit, the document can still have some errors including those of syntax and sound a like substitutions which may escape proof reading.  It such instances, actual meaningcan be extrapolated by contextual diversion.    NA = Not assessed  RTC = Rotator cuff  RCT = Rotator cuff tear  ER = External rotation  IR = Internal rotation  AC = Acromioclavicular  GH = Glenohumeral  n =  No  y = Yes

## 2020-03-21 NOTE — Consults (Addendum)
[place]  Phone: [phone]  Fax: [phone]       Physical Therapy Spine Evaluation    Date:  03/21/2020  Patient: Monica Wood  DOB: 03-Aug-1975  MRN: [id number]  Physician: Alex Gardener, MD      Insurance: [employer] (Unlimited Visits)  Medical Diagnosis: Low back pain, unspecified back pain laterality, unspecified chronicity, unspecified whether sciatica present, Fibromyalgia  Rehab Codes: M54.5, M79.7  Onset Date: 03/15/20        Next Dr.'s appt.: 05/24/20    Subjective:   CC/HPI: Pt reports to PT with LBP. Per pt, she states that she has been having pain for about 12 years off and on, noting that she had an increase in pain about a month ago after sleeping in a different bed for a few days. When pt has her flare-up, she notes that cracking her back and stretching can help with her symptoms. Per pt, she also reports that she has numbness that travels all the way down her L LE. Pt reports that this can be pretty common to have, and notes that it can occur if sitting more. Pt does state that laying on her stomach and pressing up can help with her pain, allowing her to have almost no pain during completion.    PMHx:     []  Unremarkable             [x]  Refer to full medical chart  In EPIC     Tests: []  X-Ray:   []  MRI:   []  Other:      Comorbidities:   []  Obesity []  Dialysis  [x]  N/A   []  Asthma/COPD []  Dementia []  Other:   []  Stroke []  Sleep apnea []  Other:   []  Vascular disease []  Rheumatic disease []  Other:       ADL/IADL [x]  Previously independent with all [x]  Currently independent with all Who currently assists the patient with task     []  Previously independent with all except: []  Currently independent with all except:     Bathing  []  Assist []  Assist     Dress/grooming []  Assist []  Assist     Transfer/mobility []  Assist []  Assist     Feeding []  Assist []  Assist     Toileting []  Assist []  Assist     Driving []  Assist []  Assist      Housekeeping []  Assist []  Assist     Grocery shop/meal prep []  Assist []  Assist         Gait Prior level of function Current level of function    [x]  Independent  []  Assist [x]  Independent  []  Assist   Device: [x]  Independent [x]  Independent    []  Straight Cane []  Quad cane []  Straight Cane []  Quad cane    []  Standard walker []  Rolling walker   []  4 wheeled walker []  Standard walker []  Rolling walker   []  4 wheeled walker    []  Wheelchair []  Wheelchair       Medications: [x]  Refer to full medical record []  None []  Other:  Allergies:      [x]  Refer to full medical record []  None []  Other:    Function:  Hand Dominance  []  Right  []  Left  Marital Status    Home type 1 SH   Stairs from outside    [employer]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 Yes   Location LB   Pain Rating currently 5/10   Pain at worse 10/10   Pain at best 3/10   Description of pain Dull, burning, numb   Altered Sensation Reports occasionally down L leg   What makes it worse Sleeping in different beds, exercise, sitting or standing too long   What makes it better Stretching, heat, pain, medications   Symptom progression Gotten better   Sleep Sleeping affected         Objective:   STRENGTH    Left Right   L1-2 Hip Flex 5/5 5/5   Hip Abd 3+/5 4-/5   L3-4 Knee Ext 5/5 5/5   L4 Ankle DF 5/5 5/5   L5 EHL     S1 Plant. Flex 5/5 5/5   Abdominals 3+/5    Erector Spinae     PPT from 90 to=     Knee Flexion 5/5 5/5                                         Lumbar ROM Left Right   Flexion WNL     Extension WNL     Rotation  WNL WNL   Sidebend  WNL WNL   UE/LE                                  TESTS (+/-) LEFT RIGHT Not Tested   SLR supine   [x]    Hamstring (SLR) - - []    SKTC - - []    DKTC   [x]    Slump/Dural   [x]    SI JT   [x]    FABER   [x]    Joint Mobility   [x]    Cerv. Comp   [x]    Cerv. Distraction   [x]    Cerv. Alar/Transverse   [x]     Vertebral Artery   [x]    Adson???s   [x]    Roos   [x]        OBSERVATION No Deficit Deficit Not Tested Comments   Posture       Forward Head []  []  []     Rounded Shoulders []  []  []     Kyphosis []  []  []     Lordosis []  []  []     Lateral Shift []  []  []     Scoliosis []  []  []     Iliac Crest [x]  []  []     PSIS []  []  []     ASIS [x]  []  []     Genu Valgus []  []  []     Genu Varus []  []  []     Genu Recurvatum []  []  []     Pronation []  []  []     Supination []  []  []     Leg Length Discrp []  []  []     Slumped sitting []  []  []     Palpation []  [x]  []  Pt reports general tenderness along bil LB, bil glutes   Sensation [x]  []  []  No deficits with testing   Edema []  []  [x]     Neurological []  []  [x]         Somatic Dysfunctions Normal Deficit Details   Cervical   []  []     Thoracic   []  []     Rib   []  []   Pelvis   []  [x]  L Upslip   Lumbar []  []     SI   []  []         Flexibility Normal Left tight Right tight   Hamstring []  [x]  [x]    Hip flexor []  [x]  [x]    Quad []  [x]  []    Piriformis []  [x]  [x]    ITB []  []  []    Gastroc/Soleus []  [x]  [x]     []  []  []        FUNCTION Normal Difficult Unable   Sitting []  [x]  []    Standing [x]  []  []    Ambulation [x]  []  []    Groom/Dress [x]  []  []    Lift/Carry []  [x]  []    Stairs [x]  []  []    Bending [x]  []  []    OH reach [x]  []  []    Sit to Stand [x]  []  []        Functional Test: Modified Oswestry Score: 32% functionally impaired           Assessment:  Patient would benefit from skilled physical therapy services in order to: Improve bil LE/LB flexibility, improve core/hip strength, improve tolerance to activity, and help decrease pain to allow pt to return to working out recreationally    Problems:    [x]  ? Pain:  []  ? ROM:  [x]  ? Strength:  [x]  ? Function:  []  Other:        Goals  MET NOT MET ON-  GOING  Details   Date Addressed: NA       STG: To be met in 8 treatments           1. ? Pain: Decrease pain levels to 5/10 with ADLs to help ease all activities and mobility. []   []   []       2. ? ROM: Increase flexibility in bil  LEs/LB grossly to help improve pain-free motion. []   []   []       3. Pt will report better ability to sleep, helping to improve quality of sleep. []   []   []      4. Independent with Home Exercise Programs []   []   []       []   []   []      Date Addressed: NA       LTG: To be met in 16 treatments       1. Improve score on assessment tool Modified Oswestry from 32% impairment to less than 20% impairment, demonstrating improved tolerance to activity.  []   []   []      2. Reduce pain levels to 2/10 or less with ADLs []   []   []      3. ? Strength: Increase bil hip/core strength to 4+/5 throughout to help improve all core stability and reduce need for compensations with activity. []   []   []      4. Pt will self-report ability to return to working out recreationally with no complaints or pain.            Patient goals: "Decreased or no pain, full ROM, ability to exercise w/o injury or pain"    Rehab Potential:  [x]  Good  []  Fair  []  Poor   Suggested Professional Referral:  [x]  No  []  Yes:  Barriers to Goal Achievement::  [x]  No  []  Yes:  Domestic Concerns:  [x]  No  []  Yes:    Pt. Education:  [x]  Plans/Goals, Risks/Benefits discussed  [x]  Home exercise program  Method of Education: [x]  Verbal  [x]  Demo  [x]  Written  Comprehension of Education:  [x]  Verbalizes understanding.  [  x] Demonstrates understanding.  [x]  Needs Review.  []  Demonstrates/verbalizes understanding of HEP/Ed previously given.    Treatment Plan:  [x]  Therapeutic Exercise   [x]  Electrical Stimulation  [x]  Manual Therapy     []  Lumbar/Cervical Traction  [x]  Neuromuscular Re-education [x]  Cold/hotpack []  Iontophoresis: 4 mg/mL  [x]  Instruction in HEP             Dexamethasone Sodium  []  Gait Training           Phosphate 40-80 mAmin  []  Vasocompression: Gameready    [x]  Other: Dry Needling    []   Medication allergies reviewed for use of    Dexamethasone Sodium Phosphate 9m/ml     with iontophoresis treatments.   Pt is not allergic.    Frequency:  2 x/week for 16  visits      Today???s Treatment:  Precautions: General  Exercises:  Exercise  LB Reps/ Time Weight/ Level Comments   Nustep            HS step S      Gastroc wall S      Hip flexor step S      Supine piriformis S 3x30     Figure 4 piriformis S 3x30"     Prayer S 3x30"                       TA sets      TA marches                              Other:    Somatic Dysfunctions Normal Deficit Details   Cervical   []  []     Thoracic   []  []     Rib   []  []     Pelvis   []  [x]  L Upslip- MOB   Lumbar []  []     SI   []  []         Specific Instructions for next treatment: Continue tx per POC    Evaluation Complexity:  History (Personal factors, comorbidities) []  0 [x]  1-2 []  3+   Exam (limitations, restrictions) [x]  1-2 []  3 []  4+   Clinical presentation (progression) []  Stable [x]  Evolving  []  Unstable   Decision Making [x]  Low []  Moderate []  High    [x]  Low Complexity []  Moderate Complexity []  High Complexity       Treatment Charges: Mins Units   [x]  Evaluation       [x]   Low       []   Moderate       []   High 29 1   []   Modalities     [x]   Ther Exercise 16 1   [x]   Manual Therapy 2 0   []   Ther Activities     []   Aquatics     []   Vasocompression     []   Other       TOTAL TREATMENT TIME: 47    Time in: 1200       Time out: 1256    Electronically signed by: SDeeann Dowse PT    Physician Signature:________________________________Date:__________________  By signing above or cosigning this note, I have reviewed this plan of care and certify a need for medically necessary rehabilitation services.     *PLEASE SIGN ABOVE AND FAX BACK ALL PAGES*

## 2020-03-26 NOTE — Other (Signed)
[]   [facility] &  Therapy  [address]    [phone]  F: [phone]   []  [facility] - [facility]  Outpatient Rehabilitation &  Therapy  [address]   [address]  P: [phone]  F: [phone]  []  [facility] -  [facility]  Outpatient Rehabilitation &  Therapy  [address]  P: [phone]  F: [phone] []  [facility]  Outpatient Rehabilitation &  Therapy  [address]  P: [phone]  F: [phone]  []  [facility]  Outpatient Rehabilitation &  Therapy  [address]   [phone]: [phone]  F: [phone]   []  [facility] & Therapy  [address]  [phone]: [phone]   F: [phone]     Physical Therapy Cancel/No Show note    Date: 03/26/2020  Patient: Monica Wood  DOB: [birth year]  MRN: [id number]    Cancels/No Shows to date: 1/0    For today's appointment patient:    [x]   Cancelled    []  Rescheduled appointment    []  No-show     Reason given by patient:    []   Patient ill    []   Conflicting appointment    []  No transportation      []  Conflict with work    []  No reason given    []  Weather related    []  COVID-19    []  Other:      Comments:  Family emergency      [x]  Next appointment was confirmed    Electronically signed by: 03/28/2020, PTA

## 2020-03-27 ENCOUNTER — Inpatient Hospital Stay: Payer: PRIVATE HEALTH INSURANCE | Primary: Internal Medicine

## 2020-03-29 ENCOUNTER — Inpatient Hospital Stay: Admit: 2020-03-29 | Discharge: 2020-03-29 | Payer: PRIVATE HEALTH INSURANCE | Primary: Internal Medicine

## 2020-03-29 NOTE — Other (Signed)
[]  [facility] &  Therapy  [address]  [phone]  F: [phone] []  [place]  [address]   [address]  P: [phone]  F: [phone] [x]  [place]  [place]  P: [phone]  F: [phone] []  [facility]  Outpatient Rehabilitation &  Therapy  [address]  P: [phone]  F: [phone] []  [place]  [address]   [address]   [place]: [phone]  F: [phone]      Physical Therapy Daily Treatment Note    Date:  03/29/2020  Patient Name:  Monica Wood    DOB:  [birth year]  MRN: [id number]  Physician: Alex Gardener, MD                                                                     Insurance: [employer] (Unlimited Visits)  Medical Diagnosis: Low back pain, unspecified back pain laterality, unspecified chronicity, unspecified whether sciatica present, Fibromyalgia                    Rehab Codes: M54.5, M79.7  Onset Date: 03/15/20                                                                                         Next Dr.'s appt.: 05/24/20  Visit# / total visits: 2/16       Cancels/No Shows: 1/0    Subjective:  Pt reports to PT stating that her back is feeling pretty good, however she is having some discomfort that feels like a "pulling" from her R hip down to her calf.  Pain:  [x]  Yes  []  No  Location: R posterior hip down leg   Pain Rating: (0-10 scale) 5/10  Pain altered Tx:  [x]  No  []  Yes  Action:  Comments:    Objective:  Modalities:   Precautions: General  Exercises:  Exercise  LB Reps/ Time Weight/ Level Comments    Nustep 8' ?? ?? x   ?? ?? ?? ??    HS step S 3x30" ?? ?? x   SB S 3x30"?? ?? ?? x   Hip flexor step S 3x30" ?? ?? x   Supine piriformis S 3x30 ?? ?? x   Figure 4 piriformis S 3x30" ?? ?? x   Prayer S 3x30" ?? ?? x   ?? ?? ?? ??    ?? ?? ?? ??    ?? ??  ?? ??    TA sets ??2x12, 5" ?? ?? x   TA marches ??2x10 ?? ?? x   ?? ?? ?? ??    ?? ?? ?? ??    ?? ?? ?? ??    ?? ?? ?? ??  Other: Hypervolt R gluteal region        Treatment Charges: Mins Units   []   Modalities     [x]   Ther Exercise 42 3   [x]   Manual Therapy 6 0   []   Ther Activities     []   Aquatics     []   Vasocompression     []   Other     Total Treatment time 48 3       Assessment: [x]  Progressing toward goals. Pt with good tolerance throughout treatment with no complaints. Continued with stretches added at eval with good recall on technique, as well as adding additional LE stretches to continue to work on pt's flexibility. Also able to add a few core strengthening exercises to start working on core strength with no complaints. Ended tx with manual to R gluteal region with tenderness reported with completion, but pt reported a decrease in tension following completion. Continue to progress core strength as tolerated.     []  No change.     []  Other:  [x]  Patient would continue to benefit from skilled physical therapy services in order to: Improve bil LE/LB flexibility, improve core/hip strength, improve tolerance to activity, and help decrease pain to allow pt to return to working out recreationally    Goals  MET NOT MET ON-  GOING  Details   Date Addressed: NA ?? ?? ?? ??   STG: To be met in 8 treatments  ??? ?? ?? ??   1. ???Pain: Decrease pain levels to 5/10 with ADLs to help ease all activities and mobility. [] ???? [] ???? [] ???? ??   2. ???ROM: Increase flexibility in bil LEs/LB grossly to help improve pain-free motion. [] ???? [] ???? [] ???? ??   3. Pt will report better ability to sleep, helping to improve quality of sleep. [] ???? [] ???? [] ???? ??   4. Independent with Home Exercise Programs [] ???? [] ???? [] ???? ??   ?? [] ???? [] ???? [] ???? ??   Date Addressed: NA ?? ?? ?? ??   LTG: To be met in 16 treatments ?? ?? ?? ??   1. Improve score on assessment tool Modified Oswestry from 32% impairment to less than 20% impairment, demonstrating improved tolerance to  activity.?? [] ???? [] ???? [] ???? ??   2. Reduce pain levels to??2/10 or less with ADLs [] ???? [] ???? [] ???? ??   3. ???Strength: Increase bil hip/core strength to 4+/5 throughout to help improve all core stability and reduce need for compensations with activity. [] ???? [] ???? [] ???? ??   4. Pt will self-report ability to return to working out recreationally with no complaints or pain. ?? ?? ?? ??      ??  Patient goals: "Decreased or no pain, full ROM, ability to exercise w/o injury or pain"      Pt. Education:  [x]  Yes  []  No  [x]  Reviewed Prior HEP/Ed  Method of Education: [x]  Verbal  [x]  Demo  []  Written  Comprehension of Education:  [x]  Verbalizes understanding.  [x]  Demonstrates understanding.  [x]  Needs review.  []  Demonstrates/verbalizes HEP/Ed previously given.     Plan: [x]  Continue current frequency toward long and short term goals.    [x]  Specific Instructions for subsequent treatments: Continue tx per POC, continue to progress core/hip strength      Time In: 0800            Time Out: 0856    Electronically signed by:  Deeann Dowse, PT

## 2020-04-04 ENCOUNTER — Inpatient Hospital Stay: Admit: 2020-04-04 | Discharge: 2020-04-04 | Payer: PRIVATE HEALTH INSURANCE | Primary: Internal Medicine

## 2020-04-04 NOTE — Other (Signed)
[] [facility] &  Therapy  [address]  [phone]  F: [phone] [] [place]  [address]   [address]  P: [phone]  F: [phone] [x] [place] &  Therapy  [place]  P: [phone]  F: [phone] [] [place]  P: [phone]  F: [phone] [] [place]  [place]   [address]   [place]: [phone]  F: [phone]      Physical Therapy Daily Treatment Note    Date:  04/04/2020  Patient Name:  Monica Wood    DOB:  February 14, 1976  MRN: [id number]  Physician: Alex Gardener, MD                                                                     Insurance: [employer] (Unlimited Visits)  Medical Diagnosis: Low back pain, unspecified back pain laterality, unspecified chronicity, unspecified whether sciatica present, Fibromyalgia                    Rehab Codes: M54.5, M79.7  Onset Date: 03/15/20                                                                                         Next Dr.'s appt.: 05/24/20  Visit# / total visits: 3/16       Cancels/No Shows: 1/0    Subjective:  Pt presenting to PT with 1-2/10 pain, noting that she feels that travelling and sleeping in a different bed over the weekend may have led to her discomfort.  Pain:  [x] Yes  [] No  Location: R posterior hip down leg   Pain Rating: (0-10 scale) 1-2/10  Pain altered Tx:  [x] No  [] Yes  Action:  Comments:    Objective:  Modalities:   Precautions: General  Exercises:  Exercise  LB Reps/ Time Weight/ Level Comments    Nustep 8' ?? ?? x   ?? ?? ?? ??    HS step S 3x30" ?? ?? x   SB S 3x30"?? ?? ?? x   Hip flexor step S 3x30" ?? ?? x   Supine piriformis S 3x30 ?? ?? x   Figure 4 piriformis S 3x30" ?? ?? x   Prayer S 3x30" ?? ?? x   ?? ?? ?? ??    ?? ?? ?? ??    ?? ?? ??  ??    TA sets ??2x15, 5" ?? ?? x   TA marches ??2x15 ?? ?? x   TA heel hovers 2x12?? ?? ?? x   ?? ?? ?? ??    ??  Bridges 2x12, 5"?? ?? ?? x   ??Clamshells 2x12?? ?? ?? x   SL Hip abd 2x12   x          Other: Hypervolt R gluteal region- Held today        Treatment Charges: Mins Units   []  Modalities     [x]  Ther Exercise 49 3   []  Manual Therapy     []  Ther Activities     []  Aquatics     []  Vasocompression     []  Other     Total Treatment time 49 3       Assessment: [x] Progressing toward goals. Continued with tx per log above with good tolerance. Pt demonstrates good recall of all exercises previously completed, requiring minimal cueing during completion. Able to progress pt with increased reps with previously performed TA exercises, as well as addition of TA heel hovers and mat exercises with no complaints. Pt reports increased fatigue in hips following treatment, but denies any change in pain. Held manual to assess response without completion to see if pt is able to keep maintaining lower pain levels.    [] No change.     [] Other:  [x] Patient would continue to benefit from skilled physical therapy services in order to: Improve bil LE/LB flexibility, improve core/hip strength, improve tolerance to activity, and help decrease pain to allow pt to return to working out recreationally    Goals  MET NOT MET ON-  GOING  Details   Date Addressed: NA ?? ?? ?? ??   STG: To be met in 8 treatments  ??? ?? ?? ??   1. ???Pain: Decrease pain levels to 5/10 with ADLs to help ease all activities and mobility. []???? []???? []???? ??   2. ???ROM: Increase flexibility in bil LEs/LB grossly to help improve pain-free motion. []???? []???? []???? ??   3. Pt will report better ability to sleep, helping to improve quality of sleep. []???? []???? []???? ??   4. Independent with Home Exercise Programs []???? []???? []???? ??   ?? []???? []???? []???? ??   Date Addressed: NA ?? ?? ?? ??   LTG: To be met in 16 treatments ?? ?? ?? ??   1. Improve score on assessment tool Modified Oswestry from  32% impairment to less than 20% impairment, demonstrating improved tolerance to activity.?? []???? []???? []???? ??   2. Reduce pain levels to??2/10 or less with ADLs []???? []???? []???? ??   3. ???Strength: Increase bil hip/core strength to 4+/5 throughout to help improve all core stability and reduce need for compensations with activity. []???? []???? []???? ??   4. Pt will self-report ability to return to working out recreationally with no complaints or pain. ?? ?? ?? ??      ??  Patient goals: "Decreased or no pain, full ROM, ability to exercise w/o injury or pain"      Pt. Education:  [x] Yes  [] No  [x] Reviewed Prior HEP/Ed  Method of Education: [x] Verbal  [x] Demo  [] Written  Comprehension of Education:  [x] Verbalizes understanding.  [x] Demonstrates understanding.  [x] Needs review.  [] Demonstrates/verbalizes HEP/Ed previously given.     Plan: [x] Continue current frequency toward long and short term goals.    [x] Specific Instructions for subsequent treatments: Continue tx per POC, continue to progress core/hip strength      Time In: 1158  Time Out: 1259     Electronically signed by:  Deeann Dowse, PT

## 2020-04-05 ENCOUNTER — Inpatient Hospital Stay: Admit: 2020-04-05 | Discharge: 2020-04-05 | Payer: PRIVATE HEALTH INSURANCE | Primary: Internal Medicine

## 2020-04-05 NOTE — Other (Signed)
$'[]'B$  [facility] &  Therapy  [address]  [phone]  F: [phone] '[]'$  [place]  [address]   [address]  P: [phone]  F: [phone] '[x]'$  [place]  [place]  P: [phone]  F: [phone] '[]'$  [place]  P: [phone]  F: [phone] '[]'$  [place]  [address]   [place]: [phone]  F: [phone]      Physical Therapy Daily Treatment Note    Date:  04/05/2020  Patient Name:  Monica Wood    DOB:  [birth year]  MRN: [id number]  Physician: Alex Gardener, MD                                                                     Insurance: [employer] (Unlimited Visits)  Medical Diagnosis: Low back pain, unspecified back pain laterality, unspecified chronicity, unspecified whether sciatica present, Fibromyalgia                    Rehab Codes: M54.5, M79.7  Onset Date: 03/15/20                                                                                         Next Dr.'s appt.: 05/24/20  Visit# / total visits: 4/16       Cancels/No Shows: 1/0    Subjective: Pt mentions increased R posterior hip tension this morning, usually pretty sore in the morning. Attempted to use her own hypervolt to address pain with fair response.    Pain:  '[x]'$  Yes  '[]'$  No  Location: R posterior hip down leg   Pain Rating: (0-10 scale) 1-2/10  Pain altered Tx:  '[x]'$  No  '[]'$  Yes  Action:  Comments:    Objective:  Modalities:   Precautions: General  Exercises:  Exercise  LB Reps/ Time Weight/ Level Comments    Nustep 8' ?? ??    ?? ?? ?? ??    HS step S 3x30" ?? ?? x   SB S 3x30"?? ?? ?? x   Hip flexor step S 3x30" ?? ?? x   Supine piriformis S 3x30 ?? ?? x   Figure 4 piriformis S 3x30" ?? ?? x   Prayer S 3x30" ?? ?? x   ?? ?? ?? ??    ?? ?? ?? ??     ?? ?? ?? ??    TA sets ??2x15, 5" ?? ?? x   TA marches ??2x15 ?? ?? x   TA heel hovers 2x12?? ?? ?? x   ?? ?? ?? ??    ??  Bridges 2x12, 5"?? ?? ??    ??Clamshells 2x12?? ?? ??    SL Hip abd 2x12             Other: Hypervolt R gluteal region, R calf, DI R piriformis - 25'        Treatment Charges: Mins Units   '[]'$   Modalities     '[x]'$   Ther Exercise 18 1   '[x]'$   Manual Therapy 25 2   '[]'$   Ther Activities     '[]'$   Aquatics     '[]'$   Vasocompression     '[]'$   Other     Total Treatment time 43 3       Assessment: '[x]'$  Progressing toward goals. Pt completed stretches initially to address tension. Resumed manual as noted above following stretches. Increased time spent on manual d/t increased tension present this date. Added DI to R piriformis to increase pressure to help release tension, significant tenderness present. Hypervolt also completed to address R calf tenderness. Held gluteal strengthening to avoid irritation, core strengthening therex completed.        '[]'$  No change.     '[]'$  Other:  '[x]'$  Patient would continue to benefit from skilled physical therapy services in order to: Improve bil LE/LB flexibility, improve core/hip strength, improve tolerance to activity, and help decrease pain to allow pt to return to working out recreationally    Goals  MET NOT MET ON-  GOING  Details   Date Addressed: NA ?? ?? ?? ??   STG: To be met in 8 treatments  ??? ?? ?? ??   1. ???Pain: Decrease pain levels to 5/10 with ADLs to help ease all activities and mobility. '[]'$ ???? '[]'$ ???? '[]'$ ???? ??   2. ???ROM: Increase flexibility in bil LEs/LB grossly to help improve pain-free motion. '[]'$ ???? '[]'$ ???? '[]'$ ???? ??   3. Pt will report better ability to sleep, helping to improve quality of sleep. '[]'$ ???? '[]'$ ???? '[]'$ ???? ??   4. Independent with Home Exercise Programs '[]'$ ???? '[]'$ ???? '[]'$ ???? ??   ?? '[]'$ ???? '[]'$ ???? '[]'$ ???? ??   Date Addressed: NA ?? ?? ?? ??   LTG: To be met in 16 treatments ?? ?? ?? ??   1. Improve score on assessment tool Modified Oswestry from 32% impairment to less than 20% impairment, demonstrating improved  tolerance to activity.?? '[]'$ ???? '[]'$ ???? '[]'$ ???? ??   2. Reduce pain levels to??2/10 or less with ADLs '[]'$ ???? '[]'$ ???? '[]'$ ???? ??   3. ???Strength: Increase bil hip/core strength to 4+/5 throughout to help improve all core stability and reduce need for compensations with activity. '[]'$ ???? '[]'$ ???? '[]'$ ???? ??   4. Pt will self-report ability to return to working out recreationally with no complaints or pain. ?? ?? ?? ??      ??  Patient goals: "Decreased or no pain, full ROM, ability to exercise w/o injury or pain"      Pt. Education:  '[x]'$  Yes  '[]'$  No  '[x]'$  Reviewed Prior HEP/Ed  Method of Education: '[x]'$  Verbal  '[x]'$  Demo  '[]'$  Written  Comprehension of Education:  '[x]'$  Verbalizes understanding.  '[x]'$  Demonstrates understanding.  '[x]'$  Needs review.  '[]'$  Demonstrates/verbalizes HEP/Ed previously given.     Plan: '[x]'$  Continue current frequency toward long and short term goals.    '[x]'$  Specific Instructions for subsequent treatments: Continue tx per POC, continue to progress core/hip strength      Time In: 8:00am            Time Out: 8:54am  Electronically signed by:  Burney Gauze, PTA

## 2020-04-11 ENCOUNTER — Inpatient Hospital Stay: Admit: 2020-04-11 | Discharge: 2020-04-11 | Payer: PRIVATE HEALTH INSURANCE | Primary: Internal Medicine

## 2020-04-11 NOTE — Other (Signed)
[]  [facility] &  Therapy  [address]  [phone]  F: [phone] []  [place]  [address]   [address]  P: [phone]  F: [phone] [x]  [place]  [place]  P: [phone]  F: [phone] []  [place]  P: [phone]  F: [phone] []  [place]  [place]   [address]   [place]: [phone]  F: [phone]      Physical Therapy Daily Treatment Note    Date:  04/11/2020  Patient Name:  Monica Wood    DOB:  [birth year]  MRN: [id number]  Physician: Alex Gardener, MD                                                                     Insurance: [employer] (Unlimited Visits)  Medical Diagnosis: Low back pain, unspecified back pain laterality, unspecified chronicity, unspecified whether sciatica present, Fibromyalgia                    Rehab Codes: M54.5, M79.7  Onset Date: 03/15/20                                                                                         Next Dr.'s appt.: 05/24/20  Visit# / total visits: 5/16       Cancels/No Shows: 1/0    Subjective: Pt reports to PT noting that overall she is doing okay and that her LB has been feeling better, however she has ben having more gluteal pain now.    Pain:  [x]  Yes  []  No  Location: R posterior hip down leg   Pain Rating: (0-10 scale) 6-7/10  Pain altered Tx:  [x]  No  []  Yes  Action:  Comments:    Objective:  Modalities:   Precautions: General  Exercises:  Exercise  LB Reps/ Time Weight/ Level Comments    Nustep 8' ?? ?? x   ?? ?? ?? ??    HS step S 3x30" ?? ?? x   SB S 3x30"?? ?? ?? x   Hip flexor step S 3x30" ?? ??    Supine piriformis S 3x30 ?? ??    Figure 4 piriformis S 3x30" ?? ??    Prayer S 3x30" ?? ??    ?? ?? ?? ??    ?? ?? ?? ??    ?? ?? ?? ??    TA sets  ??2x15, 5" ?? ??    TA marches ??2x15 ?? ??    TA heel hovers 2x12?? ?? ??    Palloff  Press 2x12      ?? ?? ?? ??    ??Bridges 2x12, 5"?? ??Lime ?? x   ??Clamshells 2x12?? ??Lime ?? x   SL Hip abd 2x12 Lime  x   Prone hip ext 2x10   x          Foam roller 4'   x   Lacrosse ball rollout 3'   x                 Pball wall squats 2x10   x   SL leg press 2x10 75#  x   T-band sidestepping 3L Lime  x   Other: Hypervolt R gluteal region, R calf, DI R piriformis- Held today        Treatment Charges: Mins Units   []   Modalities     [x]   Ther Exercise 47 3   []   Manual Therapy     []   Ther Activities     []   Aquatics     []   Vasocompression     []   Other     Total Treatment time 47 3       Assessment: [x]  Progressing toward goals. Continued with tx per log above with fair tolerance to treatment. Progressed pt's strengthening program today with addition of resistance with mat exercises, as well as addition of several more dynamic exercises like wall squats, SL leg press, and t-band sidestepping. Also added palloff press to focus on core strength. Added foam roller and lacrosse ball rollout today to try and give pt a technique similar to manual techniques that she can use at home to address pain, with pt reporting increased tenderness with completion. Pt denies any increase in pain following treatment, only noting increased soreness/fatigue following treatment.     []  No change.     []  Other:  [x]  Patient would continue to benefit from skilled physical therapy services in order to: Improve bil LE/LB flexibility, improve core/hip strength, improve tolerance to activity, and help decrease pain to allow pt to return to working out recreationally    Goals  MET NOT MET ON-  GOING  Details   Date Addressed: NA ?? ?? ?? ??   STG: To be met in 8 treatments  ??? ?? ?? ??   1. ???Pain: Decrease pain levels to 5/10 with ADLs to help ease all activities and mobility. [] ???? [] ???? [] ???? ??   2. ???ROM: Increase flexibility in bil LEs/LB grossly to help improve pain-free  motion. [] ???? [] ???? [] ???? ??   3. Pt will report better ability to sleep, helping to improve quality of sleep. [] ???? [] ???? [] ???? ??   4. Independent with Home Exercise Programs [] ???? [] ???? [] ???? ??   ?? [] ???? [] ???? [] ???? ??   Date Addressed: NA ?? ?? ?? ??   LTG: To be met in 16 treatments ?? ?? ?? ??   1. Improve score on assessment tool Modified Oswestry from 32% impairment to less than 20% impairment, demonstrating improved tolerance to activity.?? [] ???? [] ???? [] ???? ??   2. Reduce pain levels to??2/10 or less with ADLs [] ???? [] ???? [] ???? ??   3. ???Strength: Increase bil hip/core strength to 4+/5 throughout to help improve all core stability and reduce need for compensations with activity. [] ???? [] ???? [] ???? ??   4. Pt will self-report ability to return to working out recreationally with no complaints or pain. ?? ?? ?? ??      ??  Patient goals: "Decreased or no pain, full  ROM, ability to exercise w/o injury or pain"      Pt. Education:  [x]  Yes  []  No  [x]  Reviewed Prior HEP/Ed  Method of Education: [x]  Verbal  [x]  Demo  []  Written  Comprehension of Education:  [x]  Verbalizes understanding.  [x]  Demonstrates understanding.  [x]  Needs review.  []  Demonstrates/verbalizes HEP/Ed previously given.     Plan: [x]  Continue current frequency toward long and short term goals.    [x]  Specific Instructions for subsequent treatments: Continue tx per POC, continue to progress core/hip strength      Time In: 1648            Time Out: 1748     Electronically signed by:  Deeann Dowse, PT

## 2020-04-12 ENCOUNTER — Inpatient Hospital Stay: Admit: 2020-04-12 | Discharge: 2020-04-12 | Payer: PRIVATE HEALTH INSURANCE | Primary: Internal Medicine

## 2020-04-12 NOTE — Other (Signed)
[]  [facility] &  Therapy  [address]  [phone]  F: [phone] []  [place]  [address]   [address]  P: [phone]  F: [phone] [x]  [place]  [place]  P: [phone]  F: [phone] []  [place]  P: [phone]  F: [phone] []  [place]  [place]   [address]   [place]: [phone]  F: [phone]      Physical Therapy Daily Treatment Note    Date:  04/12/2020  Patient Name:  Monica Wood    DOB:  04/22/1976  MRN: [id number]  Physician: Alex Gardener, MD                                                                     Insurance: [employer] (Unlimited Visits)  Medical Diagnosis: Low back pain, unspecified back pain laterality, unspecified chronicity, unspecified whether sciatica present, Fibromyalgia                    Rehab Codes: M54.5, M79.7  Onset Date: 03/15/20                                                                                         Next Dr.'s appt.: 05/24/20  Visit# / total visits: 6/16       Cancels/No Shows: 1/0    Subjective: Pt reports to PT with increased pain, noting that she was pretty sore yesterday following progressions made with therapy.  Pain:  [x]  Yes  []  No  Location: R posterior hip down leg   Pain Rating: (0-10 scale) 7/10  Pain altered Tx:  [x]  No  []  Yes  Action:  Comments:    Objective:  Modalities: CP R gluteal region  Precautions: General  Exercises:  Exercise  LB Reps/ Time Weight/ Level Comments    Nustep 8' ?? ?? x   ?? ?? ?? ??    HS step S 3x30" ?? ?? x   SB S 3x30"?? ?? ?? x   Hip flexor step S 3x30" ?? ?? x   Supine piriformis S 3x30 ?? ?? x   Figure 4 piriformis S 3x30" ?? ?? x   Prayer S 3x30" ?? ??    ??Keeling hip flexor S 3x30"?? ?? ?? x   ??Prone quad  S 3x30"?? ?? ?? x   ?? ?? ?? ??    TA sets ??2x15, 5" ?? ??    TA marches ??2x15 ?? ??    TA heel hovers 2x12?? ?? ??    Palloff Press 2x12      ?? ?? ?? ??    ??  Bridges 2x12, 5"?? ??Lime ??    ??Clamshells 2x12?? ??Lime ??    SL Hip abd 2x12 Lime     Prone hip ext 2x10             Foam roller 4'      Lacrosse ball rollout 3'                    Pball wall squats 2x10      SL leg press 2x10 75#     T-band sidestepping 3L Lime     Other: Hypervolt R gluteal region, R calf, DI R piriformis- Held today      Somatic Dysfunctions Normal Deficit Details   Cervical   []  []     Thoracic   []  []     Rib   []  []     Pelvis   []  [x]  L Upslip- MOB  L anterior rotation- MET   Lumbar []  []     SI   []  [x]  R on L rotation- MOB   FRS=flexed, rotated, side-bent  ERS=extended, rotated, side-bent   MOB=Mobilization  MFR=Myofascial Release  MET=Muscle Energy Technique  MFR=Myofascial Release                Treatment Charges: Mins Units   []   Modalities     [x]   Ther Exercise 30 2   [x]   Manual Therapy 11 1   []   Ther Activities     []   Aquatics     []   Vasocompression     [x]   Other: CP 10 0   Total Treatment time 51 3       Assessment: [x]  Progressing toward goals. Held all strengthening exercises today d/t pt reports of high pain last night and into this morning from progressions made yesterday. Instead focused on stretches to keep progressing flexibility with good tolerance. Pt still requires cueing to avoid over stretching and irritating muscle more, fair carryover after. Also completed manual per log above to help improve alignment, with good results in alignment and pain reduction noted following performance. Ended tx with CP to gluteal region with pt noting that she is not sure whether or not this helped as she feels tighter, but will assess response over the day and let us know.    []  No change.     []  Other:  [x]  Patient would continue to benefit from skilled physical therapy services in order to: Improve bil LE/LB flexibility, improve core/hip strength,  improve tolerance to activity, and help decrease pain to allow pt to return to working out recreationally    Goals  MET NOT MET ON-  GOING  Details   Date Addressed: NA ?? ?? ?? ??   STG: To be met in 8 treatments  ??? ?? ?? ??   1. ???Pain: Decrease pain levels to 5/10 with ADLs to help ease all activities and mobility. [] ???? [] ???? [] ???? ??   2. ???ROM: Increase flexibility in bil LEs/LB grossly to help improve pain-free motion. [] ???? [] ???? [] ???? ??   3. Pt will report better ability to sleep, helping to improve quality of sleep. [] ???? [] ???? [] ???? ??   4. Independent with Home Exercise Programs [] ???? [] ???? [] ???? ??   ?? [] ???? [] ???? [] ???? ??   Date Addressed: NA ?? ?? ?? ??   LTG: To be met in 16 treatments ?? ?? ?? ??   1. Improve score on assessment tool Modified Oswestry from 32% impairment to less than 20% impairment, demonstrating  improved tolerance to activity.?? [] ???? [] ???? [] ???? ??   2. Reduce pain levels to??2/10 or less with ADLs [] ???? [] ???? [] ???? ??   3. ???Strength: Increase bil hip/core strength to 4+/5 throughout to help improve all core stability and reduce need for compensations with activity. [] ???? [] ???? [] ???? ??   4. Pt will self-report ability to return to working out recreationally with no complaints or pain. ?? ?? ?? ??      ??  Patient goals: "Decreased or no pain, full ROM, ability to exercise w/o injury or pain"      Pt. Education:  [x]  Yes  []  No  [x]  Reviewed Prior HEP/Ed  Method of Education: [x]  Verbal  [x]  Demo  []  Written  Comprehension of Education:  [x]  Verbalizes understanding.  [x]  Demonstrates understanding.  [x]  Needs review.  []  Demonstrates/verbalizes HEP/Ed previously given.     Plan: [x]  Continue current frequency toward long and short term goals.    [x]  Specific Instructions for subsequent treatments: Continue tx per POC, continue to progress core/hip strength      Time In: 2130            Time Out: 0852     Electronically signed by:  Deeann Dowse, PT

## 2020-04-18 ENCOUNTER — Inpatient Hospital Stay: Payer: PRIVATE HEALTH INSURANCE | Primary: Internal Medicine

## 2020-04-18 ENCOUNTER — Ambulatory Visit
Admit: 2020-04-18 | Discharge: 2020-04-18 | Payer: PRIVATE HEALTH INSURANCE | Attending: Internal Medicine | Primary: Internal Medicine

## 2020-04-18 ENCOUNTER — Inpatient Hospital Stay: Admit: 2020-04-18 | Discharge: 2020-04-18 | Payer: PRIVATE HEALTH INSURANCE | Primary: Internal Medicine

## 2020-04-18 DIAGNOSIS — M7582 Other shoulder lesions, left shoulder: Secondary | ICD-10-CM

## 2020-04-18 DIAGNOSIS — H539 Unspecified visual disturbance: Secondary | ICD-10-CM

## 2020-04-18 DIAGNOSIS — Z0000 Encounter for general adult medical examination without abnormal findings: Secondary | ICD-10-CM

## 2020-04-18 NOTE — Other (Signed)
Please notify patient that their lab results are normal.

## 2020-04-18 NOTE — Other (Signed)
[]  [facility] &  Therapy  [address]  [phone]  F: [phone] []  [place]  [address]   [address]  P: [phone]  F: [phone] [x]  [place]  [place]  P: [phone]  F: [phone] []  [place]  P: [phone]  F: [phone] []  [place]  [place]   [address]   [place]: [phone]  F: [phone]      Physical Therapy Daily Treatment Note    Date:  04/18/2020  Patient Name:  Monica Wood    DOB:  [birth year]  MRN: [id number]  Physician: Alex Gardener, MD                                                                     Insurance: [employer] (Unlimited Visits)  Medical Diagnosis: Low back pain, unspecified back pain laterality, unspecified chronicity, unspecified whether sciatica present, Fibromyalgia                    Rehab Codes: M54.5, M79.7  Onset Date: 03/15/20                                                                                         Next Dr.'s appt.: 05/24/20  Visit# / total visits: 7/16       Cancels/No Shows: 1/0    Subjective: Still with continued R posterior hip pain. Tender when standing up.   Pain:  [x]  Yes  []  No  Location: R posterior hip down leg   Pain Rating: (0-10 scale) 7/10  Pain altered Tx:  [x]  No  []  Yes  Action:  Comments:    Objective:   Modalities: CP R gluteal region  Precautions: General  Exercises:  Exercise  LB Reps/ Time Weight/ Level Comments    Nustep 8' ?? ?? x   ?? ?? ?? ??    HS step S 3x30" ?? ?? x   SB S 3x30"?? ?? ?? x   Hip flexor step S 3x30" ?? ?? x   Supine piriformis S 3x30 ?? ?? x   Figure 4 piriformis S 3x30" ?? ?? x   Prayer S 3x30" ?? ??    ??Keeling hip flexor S 3x30"?? ?? ?? x   ??Prone quad S 3x30"?? ?? ?? x   ?? ?? ?? ??    TA sets ??2x15, 5" ?? ??     TA marches ??2x15 ?? ??    TA heel hovers 2x12?? ?? ??    Palloff Press 2x12      ?? ?? ?? ??    ??  Bridges 2x12, 5"?? ??Lime ??    ??Clamshells 2x12?? ??Lime ??    SL Hip abd 2x12 Lime     Prone hip ext 2x10             Foam roller 4'      Lacrosse ball rollout 3'                    Pball wall squats 2x10      SL leg press 2x10 75#     T-band sidestepping 3L Lime     Other: Hypervolt R gluteal region, R calf, - held, DI R piriformis, glute- 18'      Somatic Dysfunctions Normal Deficit Details   Cervical   []  []     Thoracic   []  []     Rib   []  []     Pelvis   []  [x]  L Upslip- MOB  L anterior rotation- MET   Lumbar []  []     SI   []  [x]  R on L rotation- MOB   FRS=flexed, rotated, side-bent  ERS=extended, rotated, side-bent   MOB=Mobilization  MFR=Myofascial Release  MET=Muscle Energy Technique  MFR=Myofascial Release                Treatment Charges: Mins Units   []   Modalities     [x]   Ther Exercise 25 2   [x]   Manual Therapy 18 1   []   Ther Activities     []   Aquatics     []   Vasocompression     [x]   Other: CP     Total Treatment time 43 3       Assessment: [x]  Progressing toward goals. Continued with Nustep warm up followed by stretches. Upon inspection, pelvis and SI dysfunction present but corrected with MET. Held strengthening therex again today and focused on manual. DI to R piriformis and glute area with tenderness throughout. Spoke to pt in regards to self MET correction at home. Will continue to address tension and resume strengthening as able.     []  No change.     []  Other:  [x]  Patient would continue to benefit from skilled physical therapy services in order to: Improve bil LE/LB flexibility, improve core/hip strength, improve tolerance to activity, and help decrease pain to allow pt to return to working out recreationally    Goals  MET NOT MET ON-  GOING  Details   Date Addressed: NA ?? ?? ?? ??   STG: To be met in 8 treatments  ??? ?? ?? ??   1. ???Pain: Decrease pain levels to 5/10 with ADLs to help ease all activities and  mobility. [] ???? [] ???? [] ???? ??   2. ???ROM: Increase flexibility in bil LEs/LB grossly to help improve pain-free motion. [] ???? [] ???? [] ???? ??   3. Pt will report better ability to sleep, helping to improve quality of sleep. [] ???? [] ???? [] ???? ??   4. Independent with Home Exercise Programs [] ???? [] ???? [] ???? ??   ?? [] ???? [] ???? [] ???? ??   Date Addressed: NA ?? ?? ?? ??   LTG: To be met in 16 treatments ?? ?? ?? ??   1. Improve score on assessment tool Modified Oswestry from 32% impairment to less than 20% impairment, demonstrating improved tolerance to activity.?? [] ???? [] ???? [] ???? ??   2. Reduce pain levels to??2/10 or less with ADLs [] ???? [] ???? [] ???? ??   3. ???Strength: Increase bil hip/core strength to 4+/5 throughout to help improve all core stability and  reduce need for compensations with activity. [] ???? [] ???? [] ???? ??   4. Pt will self-report ability to return to working out recreationally with no complaints or pain. ?? ?? ?? ??      ??  Patient goals: "Decreased or no pain, full ROM, ability to exercise w/o injury or pain"      Pt. Education:  [x]  Yes  []  No  [x]  Reviewed Prior HEP/Ed  Method of Education: [x]  Verbal  [x]  Demo  []  Written  Comprehension of Education:  [x]  Verbalizes understanding.  [x]  Demonstrates understanding.  [x]  Needs review.  []  Demonstrates/verbalizes HEP/Ed previously given.     Plan: [x]  Continue current frequency toward long and short term goals.    [x]  Specific Instructions for subsequent treatments: Continue tx per POC, continue to progress core/hip strength      Time In: 1605            Time Out: 1655     Electronically signed by:  Burney Gauze, PTA

## 2020-04-19 ENCOUNTER — Inpatient Hospital Stay: Admit: 2020-04-19 | Discharge: 2020-04-19 | Payer: PRIVATE HEALTH INSURANCE | Primary: Internal Medicine

## 2020-04-19 LAB — HEMOGLOBIN A1C
Estimated Avg Glucose: 111 mg/dL
Hemoglobin A1C: 5.5 % (ref 4.0–6.0)

## 2020-04-19 LAB — TSH WITH REFLEX: TSH: 1.09 mIU/L (ref 0.30–5.00)

## 2020-04-19 NOTE — Progress Notes (Signed)
_0  [place]  [address]  [phone]  F: [phone] _1  [place]  [address]   [address]  P: [phone]  F: [phone] _2  [place]  [place]  P: [phone]  F: [phone] _3  [place]  P: [phone]  F: [phone] _4  [facility]  Outpatient Rehabilitation &  Therapy  [place]   [address]   [place]: [phone]  F: [phone]      Physical Therapy Daily Treatment Note/ Progress Note    Date:  04/19/2020  Patient Name:  Monica Wood    DOB:  [birth year]  MRN: [id number]  Physician: Alex Gardener, MD                                                                     Insurance: [employer] (Unlimited Visits)  Medical Diagnosis: Low back pain, unspecified back pain laterality, unspecified chronicity, unspecified whether sciatica present, Fibromyalgia                    Rehab Codes: M54.5, M79.7  Onset Date: 03/15/20                                                                                         Next Dr.'s appt.: 05/24/20  Visit# / total visits: 8/16       Cancels/No Shows: 1/0    Subjective: Pt reporting to PT with tightness, stating that she is always tight in the morning.  Pain:  _5  Yes  _6  No  Location: R posterior hip down leg   Pain Rating: (0-10 scale) 5/10  Pain altered Tx:  _7  No  _8  Yes  Action:  Comments:    Objective:   Modalities: CP R gluteal region- Held today  Precautions: General  Exercises:  Exercise  LB Reps/ Time Weight/ Level Comments    Nustep 8' ?? ?? x   ?? ?? ?? ??    HS step S 3x30" ?? ?? x   SB S 3x30"?? ?? ?? x   Hip flexor step S 3x30" ?? ?? x   Supine piriformis S 3x30 ?? ?? x   Figure 4 piriformis S 3x30" ?? ?? x   Prayer S 3x30" ?? ??    ??Keeling hip flexor S 3x30"?? ?? ??    ??Prone quad S 3x30"?? ??  ??    ?? ?? ?? ??    TA sets ??2x15, 5" ?? ??    TA marches ??2x15 ?? ??    TA heel hovers 2x12?? ?? ??    Palloff Press 2x12  Dead bugs with ball 2x15   x   Isometric crunches 2x10, 3"   x          ?? ?? ?? ??    ??Bridges 2x10, 5"?? ??Lime ?? x   ??Clamshells 2x10?? ??Lime ?? x   SL Hip abd 2x10 Lime  x   Prone hip ext 2x10   x          Foam roller 4'      Lacrosse ball rollout 3'                    Pball wall squats 2x10      SL leg press 2x10 75#     T-band sidestepping 3L Lime     Other: Hypervolt R gluteal region, R calf, - held, DI R piriformis, glute- - Held today                Treatment Charges: Mins Units   _0   Modalities     _1   Ther Exercise 39 3   _2   Manual Therapy     _3   Ther Activities     _4   Aquatics     _5   Vasocompression     _6   Other: CP     Total Treatment time 39 3       Assessment: _7  Progressing toward goals. Continued with tx per POC with no complaints. D/t increased pain last day pt was progressed with strengthening program, only completed mat exercises to assess response. Decreased reps with mat exercises with pt reporting increased fatigue and difficulty with completion with R hip. Also able to add additional core exercises to progress strength, again with no pain reported, but fatigue noted following completion. Since starting PT, pt reports that she feels that she has seen some improvements in her LB. Pt reports that she feels that her flexibility and ROM have increased, as well as learning new stretches and things to do to help address discomfort when it occurs. Currently, pt reports that she is still wanting to improving her strength, however notes that she has high pain after progressing any strength training so far. Planning to continue with current POC, continuing to find good balance in progressions to try and minimize increased pain with progressions.    _8  No change.     _9  Other:  _10  Patient would continue to benefit from skilled physical therapy services in order to: Improve bil LE/LB  flexibility, improve core/hip strength, improve tolerance to activity, and help decrease pain to allow pt to return to working out recreationally    Goals  MET NOT MET ON-  GOING  Details   Date Addressed: NA ?? ?? ?? ??   STG: To be met in 8 treatments  ??? ?? ?? ??   1. ???Pain: Decrease pain levels to 5/10 with ADLs to help ease all activities and mobility. _11 ???? _12 ???? _13 ???? Still up to 8/10??   2. ???ROM: Increase flexibility in bil LEs/LB grossly to help improve pain-free motion. _14 ???? _15 ???? _16 ???? Still with tightness in posterior glutes??   3. Pt will report better ability to sleep, helping to improve quality of sleep. _17 ???? _18 ???? _19 ???? Pt still reporting discomfort with sleeping??   4. Independent with Home Exercise Programs _20 ???? _21 ???? _22 ???? ??   ?? _23 ???? _24 ???? _25 ???? ??   Date Addressed: NA ?? ?? ?? ??   LTG: To be met in 16 treatments ?? ?? ?? ??   1. Improve score  on assessment tool Modified Oswestry from 32% impairment to less than 20% impairment, demonstrating improved tolerance to activity.?? _0 ???? _1 ???? _2 ???? ??   2. Reduce pain levels to??2/10 or less with ADLs _3 ???? _4 ???? _5 ???? ??   3. ???Strength: Increase bil hip/core strength to 4+/5 throughout to help improve all core stability and reduce need for compensations with activity. _6 ???? _7 ???? _8 ???? ??   4. Pt will self-report ability to return to working out recreationally with no complaints or pain. ?? ?? ?? ??      ??  Patient goals: "Decreased or no pain, full ROM, ability to exercise w/o injury or pain"      Pt. Education:  _9  Yes  _10  No  _11  Reviewed Prior HEP/Ed  Method of Education: _12  Verbal  _13  Demo  _14  Written  Comprehension of Education:  _15  Verbalizes understanding.  _16  Demonstrates understanding.  _17  Needs review.  _18  Demonstrates/verbalizes HEP/Ed previously given.     Plan: _19  Continue current frequency toward long and short term goals.    _20  Specific Instructions for subsequent treatments: Continue tx per POC, continue to progress core/hip strength      Time  In: 0800            Time Out: 0852     Electronically signed by:  Deeann Dowse, PT

## 2020-04-25 ENCOUNTER — Inpatient Hospital Stay: Admit: 2020-04-25 | Discharge: 2020-04-25 | Payer: PRIVATE HEALTH INSURANCE | Primary: Internal Medicine

## 2020-04-25 ENCOUNTER — Encounter: Payer: PRIVATE HEALTH INSURANCE | Primary: Internal Medicine

## 2020-04-25 NOTE — Other (Signed)
[]  [facility] &  Therapy  [address]  [phone]  F: [phone] []  [place]  [address]   [address]  P: [phone]  F: [phone] [x]  [place]  Outpatient Rehabilitation &  Therapy  [place]  P: [phone]  F: [phone] []  [facility]  Outpatient Rehabilitation &  Therapy  [place]  P: [phone]  F: [phone] []  [place]  [place]   [address]   [place]: [phone]  F: [phone]      Physical Therapy Daily Treatment Note/ Progress Note    Date:  04/25/2020  Patient Name:  Monica Wood    DOB:  06-15-1976  MRN: [id number]  Physician: Alex Gardener, MD                                                                     Insurance: [employer] (Unlimited Visits)  Medical Diagnosis: Low back pain, unspecified back pain laterality, unspecified chronicity, unspecified whether sciatica present, Fibromyalgia                    Rehab Codes: M54.5, M79.7  Onset Date: 03/15/20                                                                                         Next Dr.'s appt.: 05/24/20  Visit# / total visits: 9/16       Cancels/No Shows: 1/0    Subjective: No significant change in symptoms. Still reports feeling better after therapy but does not last for prolonged time.   Pain:  [x]  Yes  []  No  Location: R posterior hip down leg   Pain Rating: (0-10 scale) 5/10  Pain altered Tx:  [x]  No  []  Yes  Action:  Comments:    Objective:   Modalities: CP R gluteal region- Held today  Precautions: General  Exercises:  Exercise  LB Reps/ Time Weight/ Level Comments    Nustep 8' ?? ?? x   ?? ?? ?? ??    HS step S 3x30" ?? ?? x   SB S 3x30"?? ?? ?? x   Hip flexor step S 3x30" ?? ?? x   Supine piriformis S 3x30 ?? ?? x   Figure 4 piriformis S 3x30" ?? ?? x   Prayer S 3x30" ?? ??    ??Keeling hip flexor S  3x30"?? ?? ??    ??Prone quad S 3x30"?? ?? ??    ?? ?? ?? ??    TA sets ??2x15, 5" ?? ??    TA marches ??2x15 ?? ??    TA heel hovers 2x12?? ?? ??  Palloff Press 2x12      Dead bugs with ball 2x15   x   Isometric crunches 2x15, 3"   x   palloff press 2x15 Red SC  x   ?? ?? ?? ??    ??Bridges 2x10, 5"?? Lime ?? x   ??Clamshells 2x10?? Lime ?? x   SL Hip abd 2x10 Lime  x   Prone hip ext 2x10   x          Foam roller 4'      Lacrosse ball rollout 3'                    Pball wall squats 15x   x   SL leg press 2x10 75#     T-band sidestepping 3L Lime     Other: Hypervolt R gluteal region, R calf, - held, DI R piriformis, glute- 10'                Treatment Charges: Mins Units   []   Modalities     [x]   Ther Exercise 40 2   [x]   Manual Therapy 10 1   []   Ther Activities     []   Aquatics     []   Vasocompression     []   Other: CP     Total Treatment time 50 3       Assessment: [x]  Progressing toward goals. Stretches completed with good tolerance and good recall. Continued with more focus on LE and core strengthening to address pain symptoms. Pt mentions increased fatigue of lateral hip with progression through therex. Added palloff press and resumed PB wall squats to begin WB strengthening. Cueing with squats to decrease ROM to avoid pain. Denied R posterior hip pain but minor R knee pain with squats. Ended with DI to R piriformis.      []  No change.     []  Other:  [x]  Patient would continue to benefit from skilled physical therapy services in order to: Improve bil LE/LB flexibility, improve core/hip strength, improve tolerance to activity, and help decrease pain to allow pt to return to working out recreationally    Goals  MET NOT MET ON-  GOING  Details   Date Addressed: NA ?? ?? ?? ??   STG: To be met in 8 treatments  ??? ?? ?? ??   1. ???Pain: Decrease pain levels to 5/10 with ADLs to help ease all activities and mobility. [] ???? [] ???? [x] ???? Still up to 8/10??   2. ???ROM: Increase flexibility in bil LEs/LB grossly to help improve pain-free motion. [] ????  [] ???? [x] ???? Still with tightness in posterior glutes??   3. Pt will report better ability to sleep, helping to improve quality of sleep. [] ???? [] ???? [x] ???? Pt still reporting discomfort with sleeping??   4. Independent with Home Exercise Programs [x] ???? [] ???? [] ???? ??   ?? [] ???? [] ???? [] ???? ??   Date Addressed: NA ?? ?? ?? ??   LTG: To be met in 16 treatments ?? ?? ?? ??   1. Improve score on assessment tool Modified Oswestry from 32% impairment to less than 20% impairment, demonstrating improved tolerance to activity.?? [] ???? [] ???? [] ???? ??   2. Reduce pain levels to??2/10 or less with ADLs [] ???? [] ???? [] ???? ??   3. ???Strength: Increase bil hip/core strength to 4+/5 throughout to help improve all core stability and reduce need for compensations with activity. [] ???? [] ???? [] ???? ??   4. Pt will self-report ability to return to  working out recreationally with no complaints or pain. ?? ?? ?? ??      ??  Patient goals: "Decreased or no pain, full ROM, ability to exercise w/o injury or pain"      Pt. Education:  [x]  Yes  []  No  [x]  Reviewed Prior HEP/Ed  Method of Education: [x]  Verbal  [x]  Demo  []  Written  Comprehension of Education:  [x]  Verbalizes understanding.  [x]  Demonstrates understanding.  [x]  Needs review.  []  Demonstrates/verbalizes HEP/Ed previously given.     Plan: [x]  Continue current frequency toward long and short term goals.    [x]  Specific Instructions for subsequent treatments: Continue tx per POC, continue to progress core/hip strength      Time In: 1150            Time Out: 1252     Electronically signed by:  Burney Gauze, PTA

## 2020-05-02 ENCOUNTER — Inpatient Hospital Stay: Admit: 2020-05-02 | Discharge: 2020-05-02 | Payer: PRIVATE HEALTH INSURANCE | Primary: Internal Medicine

## 2020-05-02 NOTE — Other (Signed)
[]  [place]  [address]  [phone]  F: [phone] []  [place]  [address]   [address]  P: [phone]  F: [phone] [x]  [place]  P: [phone]  F: [phone] []  [place]  P: [phone]  F: [phone] []  [place]  [place]   [address]   [place]: [phone]  F: [phone]      Physical Therapy Daily Treatment NoteNote    Date:  05/02/2020  Patient Name:  Monica Wood    DOB:  1976/05/02  MRN: [id number]  Physician: Alex Gardener, MD                                                                     Insurance: [employer] (Unlimited Visits)  Medical Diagnosis: Low back pain, unspecified back pain laterality, unspecified chronicity, unspecified whether sciatica present, Fibromyalgia                    Rehab Codes: M54.5, M79.7  Onset Date: 03/15/20                                                                                         Next Dr.'s appt.: 05/24/20  Visit# / total visits: 10/16       Cancels/No Shows: 1/0    Subjective: Pt reports to PT with some pain more centrally located in gluteal area, however her lateral hip is feeling better.  Pain:  [x]  Yes  []  No  Location: R posterior hip down leg   Pain Rating: (0-10 scale) 4-5/10  Pain altered Tx:  [x]  No  []  Yes  Action:  Comments:      Objective:   Modalities: CP R gluteal region- Held today  Precautions: General  Exercises:  Exercise  LB Reps/ Time Weight/ Level Comments    Nustep 8' ?? ?? x   ?? ?? ?? ??    HS step S 3x30" ?? ?? x   SB S 3x30"?? ?? ?? x   Hip flexor step S 3x30" ?? ?? x   Supine piriformis S 3x30 ?? ?? x   Figure 4 piriformis S 3x30" ?? ?? x   Prayer S 3x30" ?? ??    ??Keeling hip flexor S 3x30"?? ?? ??     ??Prone quad S 3x30"?? ?? ??    ?? ?? ?? ??    TA sets ??2x15, 5" ?? ??    TA marches ??2x15 ?? ??    TA heel hovers 2x12?? ?? ??  Palloff Press 2x12      Dead bugs with ball 3x12   x   Isometric crunches 3x12, 3"   x   palloff press 2x15 Red SC     ?? ?? ?? ??    ??Bridges 2x12, 5"?? Lime ?? x   ??Clamshells 2x12 Lime ?? x   SL Hip abd 2x12 Lime  x   Prone hip ext 2x10   x          Foam roller 4'   x   Lacrosse ball rollout 3'                    Pball wall squats 2x10   x   SL leg press 2x10 75#     T-band sidestepping 3L Lime  x   Step ups 2x10 6"  x   Other: Hypervolt R gluteal region            Treatment Charges: Mins Units   []   Modalities     [x]   Ther Exercise 50 3   [x]   Manual Therapy 7 1   []   Ther Activities     []   Aquatics     []   Vasocompression     []   Other: CP     Total Treatment time 57 4       Assessment: [x]  Progressing toward goals. Continued with tx per log with no complaints. Worked on progressing more functional exercises back into POC with increased reps with pball wall squats, as well as resuming tband sidestepping and adding step ups. Additionally, able to progress reps with all mat exercises with good tolerance. Pt reports fatigue with progressions, but denies increased pain with completion. Ended with manual to help reduce tension with pt reporting increased soreness following completion.    []  No change.     []  Other:  [x]  Patient would continue to benefit from skilled physical therapy services in order to: Improve bil LE/LB flexibility, improve core/hip strength, improve tolerance to activity, and help decrease pain to allow pt to return to working out recreationally    Goals  MET NOT MET ON-  GOING  Details   Date Addressed: NA ?? ?? ?? ??   STG: To be met in 8 treatments  ??? ?? ?? ??   1. ???Pain: Decrease pain levels to 5/10 with ADLs to help ease all activities and mobility. [] ???? [] ???? [x] ???? Still up to 8/10??   2. ???ROM: Increase flexibility in bil LEs/LB grossly to help improve pain-free motion. [] ???? [] ????  [x] ???? Still with tightness in posterior glutes??   3. Pt will report better ability to sleep, helping to improve quality of sleep. [] ???? [] ???? [x] ???? Pt still reporting discomfort with sleeping??   4. Independent with Home Exercise Programs [x] ???? [] ???? [] ???? ??   ?? [] ???? [] ???? [] ???? ??   Date Addressed: NA ?? ?? ?? ??   LTG: To be met in 16 treatments ?? ?? ?? ??   1. Improve score on assessment tool Modified Oswestry from 32% impairment to less than 20% impairment, demonstrating improved tolerance to activity.?? [] ???? [] ???? [] ???? ??   2. Reduce pain levels to??2/10 or less with ADLs [] ???? [] ???? [] ???? ??   3. ???Strength: Increase bil hip/core strength to 4+/5 throughout to help improve all core stability and reduce need for compensations with activity. [] ???? [] ???? [] ???? ??   4. Pt will self-report ability to return to working out recreationally with no complaints or  pain. ?? ?? ?? ??      ??  Patient goals: "Decreased or no pain, full ROM, ability to exercise w/o injury or pain"      Pt. Education:  [x]  Yes  []  No  [x]  Reviewed Prior HEP/Ed  Method of Education: [x]  Verbal  [x]  Demo  []  Written  Comprehension of Education:  [x]  Verbalizes understanding.  [x]  Demonstrates understanding.  [x]  Needs review.  []  Demonstrates/verbalizes HEP/Ed previously given.     Plan: [x]  Continue current frequency toward long and short term goals.    [x]  Specific Instructions for subsequent treatments: Continue tx per POC, continue to progress core/hip strength      Time In: 1150            Time Out: 0350     Electronically signed by:  Deeann Dowse, PT

## 2020-05-03 ENCOUNTER — Inpatient Hospital Stay: Admit: 2020-05-03 | Discharge: 2020-05-03 | Payer: PRIVATE HEALTH INSURANCE | Primary: Internal Medicine

## 2020-05-03 NOTE — Other (Signed)
_0  [place]  [address]  [phone]  F: [phone] _1  [employer] [place]  [address]   [address]  P: [phone]  F: [phone] _2  [place]  [place]  P: [phone]  F: [phone] _3  [place]  P: [phone]  F: [phone] _4  [facility]  Outpatient Rehabilitation &  Therapy  [place]   [address]   [place]: [phone]  F: [phone]      Physical Therapy Daily Treatment NoteNote    Date:  05/03/2020  Patient Name:  Monica Wood    DOB:  1976-01-20  MRN: [id number]  Physician: Alex Gardener, MD                                                                     Insurance: [employer] (Unlimited Visits)  Medical Diagnosis: Low back pain, unspecified back pain laterality, unspecified chronicity, unspecified whether sciatica present, Fibromyalgia                    Rehab Codes: M54.5, M79.7  Onset Date: 03/15/20                                                                                         Next Dr.'s appt.: 05/24/20  Visit# / total visits: 11/16       Cancels/No Shows: 1/0    Subjective: Pt arrives this morning with increased soreness from PT yesterday. Pain of R posterior hip is present as well.   Pain:  _5  Yes  _6  No  Location: R posterior hip down leg   Pain Rating: (0-10 scale) 4-5/10  Pain altered Tx:  _7  No  _8  Yes  Action:  Comments:      Objective:   Modalities: CP R gluteal region- Held today  Precautions: General  Exercises:  Exercise  LB Reps/ Time Weight/ Level Comments    Nustep 8' ?? ?? x   ?? ?? ?? ??    HS step S 3x30" ?? ?? x   SB S 3x30"?? ?? ?? x   Hip flexor step S 3x30" ?? ?? x   Supine piriformis S 3x30 ?? ?? x   Figure 4 piriformis S 3x30" ?? ?? x   Prayer S 3x30" ?? ??    ??Keeling hip flexor S 3x30"?? ?? ??     ??Prone quad S 3x30"?? ?? ??    ?? ?? ?? ??    TA sets ??2x15, 5" ?? ??    TA marches ??2x15 ?? ??    TA heel hovers 2x12?? ?? ??  Palloff Press 2x12      Dead bugs with ball 3x12      Isometric crunches 3x12, 3"      palloff press 2x15 Red SC     ?? ?? ?? ??    ??Bridges 2x12, 5"?? Lime ?? x   ??Clamshells 2x12 Lime ?? x   SL Hip abd 2x12 Lime  x   Prone hip ext 2x10   x          Foam roller 4'      Lacrosse ball rollout 3'                    Pball wall squats 2x10   x   SL leg press 2x10 75#     T-band sidestepping 3L Lime     Step ups 2x10 6"  x   Other: Hypervolt R gluteal region            Treatment Charges: Mins Units   _0   Modalities     _1   Ther Exercise 40 2   _2   Manual Therapy 10 1   _3   Ther Activities     _4   Aquatics     _5   Vasocompression     _6   Other: CP     Total Treatment time 50 3       Assessment: _7  Progressing toward goals. Continued with nustep warm up followed by stretches. Mat therex completed with good recall but fatigue present. No increased pain but pt with increased difficulty this date d/t fatigue as well as experiencing an upset stomach with progression through treatment. Held supine core work as well as lateral stepping and more time focused on manual to address soreness.      _8  No change.     _9  Other:  _10  Patient would continue to benefit from skilled physical therapy services in order to: Improve bil LE/LB flexibility, improve core/hip strength, improve tolerance to activity, and help decrease pain to allow pt to return to working out recreationally    Goals  MET NOT MET ON-  GOING  Details   Date Addressed: NA ?? ?? ?? ??   STG: To be met in 8 treatments  ??? ?? ?? ??   1. ???Pain: Decrease pain levels to 5/10 with ADLs to help ease all activities and mobility. _11 ???? _12 ???? _13 ???? Still up to 8/10??   2. ???ROM: Increase flexibility in bil LEs/LB grossly to help improve pain-free motion. _14 ???? _15 ???? _16 ???? Still with tightness in posterior glutes??   3. Pt will report better ability to sleep, helping to  improve quality of sleep. _17 ???? _18 ???? _19 ???? Pt still reporting discomfort with sleeping??   4. Independent with Home Exercise Programs _20 ???? _21 ???? _22 ???? ??   ?? _23 ???? _24 ???? _25 ???? ??   Date Addressed: NA ?? ?? ?? ??   LTG: To be met in 16 treatments ?? ?? ?? ??   1. Improve score on assessment tool Modified Oswestry from 32% impairment to less than 20% impairment, demonstrating improved tolerance to activity.?? _26 ???? _27 ???? _28 ???? ??   2. Reduce pain levels to??2/10 or less with ADLs _29 ???? _30 ???? _31 ???? ??   3. ???Strength: Increase bil hip/core strength to 4+/5 throughout to help improve all core stability and reduce need for compensations with activity. _32 ???? _33 ???? _34 ???? ??   4. Pt will self-report ability to return to working out recreationally with no complaints or pain. ?? ?? ?? ??      ??  Patient goals: "Decreased or no pain, full ROM, ability to exercise w/o injury or pain"      Pt. Education:  _0  Yes  _1  No  _2  Reviewed Prior HEP/Ed  Method of Education: _3  Verbal  _4  Demo  _5  Written  Comprehension of Education:  _6  Verbalizes understanding.  _7  Demonstrates understanding.  _8  Needs review.  _9  Demonstrates/verbalizes HEP/Ed previously given.     Plan: _10  Continue current frequency toward long and short term goals.    _11  Specific Instructions for subsequent treatments: Continue tx per POC, continue to progress core/hip strength      Time In: 0800            Time Out: 0855     Electronically signed by:  Burney Gauze, PTA

## 2020-05-09 ENCOUNTER — Inpatient Hospital Stay: Admit: 2020-05-09 | Discharge: 2020-05-09 | Payer: PRIVATE HEALTH INSURANCE | Primary: Internal Medicine

## 2020-05-09 NOTE — Other (Signed)
[]  [facility]  Therapy  [address]  [phone]  F: [phone] []  [place]  [address]   [address]  P: [phone]  F: [phone] [x]  [place]  [place]  P: [phone]  F: [phone] []  [place]  P: [phone]  F: [phone] []  [place]  [address]   [address]   [place]: [phone]  F: [phone]      Physical Therapy Daily Treatment NoteNote    Date:  05/09/2020  Patient Name:  Monica Wood    DOB:  1975-08-10  MRN: [id number]  Physician: Alex Gardener, MD                                                                     Insurance: [employer] (Unlimited Visits)  Medical Diagnosis: Low back pain, unspecified back pain laterality, unspecified chronicity, unspecified whether sciatica present, Fibromyalgia                    Rehab Codes: M54.5, M79.7  Onset Date: 03/15/20                                                                                         Next Dr.'s appt.: 05/24/20  Visit# / total visits: 12/16       Cancels/No Shows: 1/0    Subjective:  Pt arrives this morning stating that she feels things are starting to get a little better, less pain/soreness this morning. Stretching daily.   Pain:  [x]  Yes  []  No  Location: R posterior hip down leg   Pain Rating: (0-10 scale) 3/10  Pain altered Tx:  [x]  No  []  Yes  Action:  Comments:      Objective:   Modalities: CP R gluteal region- Held today  Precautions: General  Exercises:  Exercise  LB Reps/ Time Weight/ Level Comments    Nustep 8' ?? ?? x   ?? ?? ?? ??    HS step S 3x30" ?? ?? x   SB S 3x30"?? ?? ?? x   Hip flexor step S 3x30" ?? ?? x   Supine piriformis S 3x30 ?? ?? x   Figure 4 piriformis S 3x30" ?? ?? x   Prayer S 3x30" ?? ??     ??Keeling hip flexor S 3x30"?? ?? ??    ??Prone quad S 3x30"?? ?? ??    ?? ?? ?? ??    TA sets ??2x15, 5" ?? ??    TA marches ??2x15 ?? ??    TA heel hovers  2x12?? ?? ??    Palloff Press 2x12      Dead bugs with ball 3x12   x   Isometric crunches 3x12, 3"   x   palloff press 2x15 Red SC     ?? ?? ?? ??    ??Bridges 2x12, 5"?? Lime ?? x   ??Clamshells 2x15 Lime ?? x   SL Hip abd 2x15 Lime  x   Prone hip ext 2x10   x          Foam roller 4'      Lacrosse ball rollout 3'                    Pball wall squats 2x10   x   SL leg press 2x10 75#     T-band sidestepping 3L Lime     Step ups 2x10 6"  fwd, lateral x   Other: Hypervolt R gluteal region            Treatment Charges: Mins Units   []   Modalities     [x]   Ther Exercise 42 3   [x]   Manual Therapy 5    []   Ther Activities     []   Aquatics     []   Vasocompression     []   Other: CP     Total Treatment time 47 3       Assessment: [x]  Progressing toward goals. Attempted to progress warm up to more dynamic exercises but pt requesting to hold the elliptical or bike. Continued strengthening focused program. Increased reps of side lying mat therex. Fatigue noted. Improved tolerance to squats and step up this date. Ended with hypervolt to reduce post ex tension.       []  No change.     []  Other:  [x]  Patient would continue to benefit from skilled physical therapy services in order to: Improve bil LE/LB flexibility, improve core/hip strength, improve tolerance to activity, and help decrease pain to allow pt to return to working out recreationally    Goals  MET NOT MET ON-  GOING  Details   Date Addressed: NA ?? ?? ?? ??   STG: To be met in 8 treatments  ??? ?? ?? ??   1. ???Pain: Decrease pain levels to 5/10 with ADLs to help ease all activities and mobility. [] ???? [] ???? [x] ???? Still up to 8/10??   2. ???ROM: Increase flexibility in bil LEs/LB grossly to help improve pain-free motion. [] ???? [] ???? [x] ???? Still with tightness in posterior glutes??   3. Pt will report better ability to sleep, helping to improve quality  of sleep. [] ???? [] ???? [x] ???? Pt still reporting discomfort with sleeping??   4. Independent with Home Exercise Programs [x] ???? [] ???? [] ???? ??   ?? [] ???? [] ???? [] ???? ??   Date Addressed: NA ?? ?? ?? ??   LTG: To be met in 16 treatments ?? ?? ?? ??   1. Improve score on assessment tool Modified Oswestry from 32% impairment to less than 20% impairment, demonstrating improved tolerance to activity.?? [] ???? [] ???? [] ???? ??   2. Reduce pain levels to??2/10 or less with ADLs [] ???? [] ???? [] ???? ??   3. ???Strength: Increase bil hip/core strength to 4+/5 throughout to help improve all core stability and reduce need for compensations with activity. [] ???? [] ???? [] ???? ??   4. Pt will self-report ability to return to working out recreationally with no complaints or pain. ?? ?? ?? ??      ??  Patient goals: "Decreased or no pain, full ROM, ability to exercise w/o injury or pain"      Pt. Education:  [x]  Yes  []  No  [x]  Reviewed Prior HEP/Ed  Method of Education: [x]  Verbal  [x]  Demo  []  Written  Comprehension of Education:  [x]  Verbalizes understanding.  [x]  Demonstrates understanding.  [x]  Needs review.  []  Demonstrates/verbalizes HEP/Ed previously given.     Plan: [x]  Continue current frequency toward long and short term goals.    [x]  Specific Instructions for subsequent treatments: Continue tx per POC, continue to progress core/hip strength      Time In: 6314            Time Out: 0948     Electronically signed by:  Burney Gauze, PTA

## 2020-05-10 ENCOUNTER — Inpatient Hospital Stay: Admit: 2020-05-10 | Discharge: 2020-05-10 | Payer: PRIVATE HEALTH INSURANCE | Primary: Internal Medicine

## 2020-05-10 NOTE — Other (Signed)
[]  [place]  [address]  [phone]  F: [phone] []  [place]  [address]   [address]  P: [phone]  F: [phone] [x]  [place] &  Therapy  [place]  P: [phone]  F: [phone] []  [place]  P: [phone]  F: [phone] []  [place]  [place]   [address]   [place]: [phone]  F: [phone]      Physical Therapy Daily Treatment NoteNote    Date:  05/10/2020  Patient Name:  Monica Wood    DOB:  [birth year]  MRN: [id number]  Physician: Alex Gardener, MD                                                                     Insurance: [employer] (Unlimited Visits)  Medical Diagnosis: Low back pain, unspecified back pain laterality, unspecified chronicity, unspecified whether sciatica present, Fibromyalgia                    Rehab Codes: M54.5, M79.7  Onset Date: 03/15/20                                                                                         Next Dr.'s appt.: 05/24/20  Visit# / total visits: 13/16       Cancels/No Shows: 1/0    Subjective:  Muscular soreness this morning from her PT yesterday. Very minor pain into the R posterior hip this morning.    Pain:  [x]  Yes  []  No  Location: R posterior hip down leg   Pain Rating: (0-10 scale) 1-2/10  Pain altered Tx:  [x]  No  []  Yes  Action:  Comments:      Objective:   Modalities: CP R gluteal region- Held today  Precautions: General  Exercises:  Exercise  LB Reps/ Time Weight/ Level Comments      Nustep 8' ?? ?? x     ?? ?? ?? ??      HS step S 3x30" ?? ?? x     SB S 3x30"?? ?? ?? x     Hip flexor step S 3x30" ?? ?? x     Supine piriformis S 3x30 ?? ?? x     Figure 4 piriformis S 3x30" ?? ?? x     Prayer S 3x30" ?? ??      ??Keeling hip  flexor S 3x30"?? ?? ??      ??Prone quad S 3x30"?? ?? ??      ?? ?? ?? ??  TA sets ??2x15, 5" ?? ??      TA marches ??2x15 ?? ??      TA heel hovers 2x12?? ?? ??      Palloff Press 2x12        Dead bugs with ball 3x12        Isometric crunches 3x12, 3"        palloff press 2x15 Red SC       ?? ?? ?? ??      ??Bridges 2x12, 5"?? Lime ?? x     ??Clamshells 2x15 Lime ?? x     SL Hip abd 2x15 Lime  x     Prone hip ext 2x10   x              Foam roller 4'   x     Lacrosse ball rollout 3'                 Lunges 10x ea   x     Chair squats 2x10  Foam pad  x     SL leg press 2x10 75#       T-band sidestepping 3L Lime       Step ups 2x10 6"  fwd, lateral x     Other: Hypervolt R gluteal region            Treatment Charges: Mins Units   []   Modalities     [x]   Ther Exercise 42 3   [x]   Manual Therapy 5    []   Ther Activities     []   Aquatics     []   Vasocompression     []   Other: CP     Total Treatment time 47 3       Assessment: [x]  Progressing toward goals. Continued with strengthening therex. Added lunges and progressed squats to chair taps, no increased pain but LE fatigue noted. Mat therex continue to be fatiguing as well. Resumed foam roller this date and ended with hypervolt to decrease post ex soreness. Will continue to progress as tol.        []  No change.     []  Other:  [x]  Patient would continue to benefit from skilled physical therapy services in order to: Improve bil LE/LB flexibility, improve core/hip strength, improve tolerance to activity, and help decrease pain to allow pt to return to working out recreationally    Goals  MET NOT MET ON-  GOING  Details   Date Addressed: NA ?? ?? ?? ??   STG: To be met in 8 treatments  ??? ?? ?? ??   1. ???Pain: Decrease pain levels to 5/10 with ADLs to help ease all activities and mobility. [] ???? [] ???? [x] ???? Still up to 8/10??   2. ???ROM: Increase flexibility in bil LEs/LB grossly to help improve pain-free motion. [] ???? [] ???? [x] ???? Still with tightness in posterior glutes??   3. Pt will report better ability  to sleep, helping to improve quality of sleep. [] ???? [] ???? [x] ???? Pt still reporting discomfort with sleeping??   4. Independent with Home Exercise Programs [x] ???? [] ???? [] ???? ??   ?? [] ???? [] ???? [] ???? ??   Date Addressed: NA ?? ?? ?? ??   LTG: To be met in 16 treatments ?? ?? ?? ??   1. Improve score on assessment tool Modified Oswestry from 32% impairment to less than 20% impairment, demonstrating improved tolerance to activity.?? [] ???? [] ???? [] ???? ??   2. Reduce pain levels to??2/10 or  less with ADLs [] ???? [] ???? [] ???? ??   3. ???Strength: Increase bil hip/core strength to 4+/5 throughout to help improve all core stability and reduce need for compensations with activity. [] ???? [] ???? [] ???? ??   4. Pt will self-report ability to return to working out recreationally with no complaints or pain. ?? ?? ?? ??      ??  Patient goals: "Decreased or no pain, full ROM, ability to exercise w/o injury or pain"      Pt. Education:  [x]  Yes  []  No  [x]  Reviewed Prior HEP/Ed  Method of Education: [x]  Verbal  [x]  Demo  []  Written  Comprehension of Education:  [x]  Verbalizes understanding.  [x]  Demonstrates understanding.  [x]  Needs review.  []  Demonstrates/verbalizes HEP/Ed previously given.     Plan: [x]  Continue current frequency toward long and short term goals.    [x]  Specific Instructions for subsequent treatments: Continue tx per POC, continue to progress core/hip strength      Time In: 0800            Time Out: 0854     Electronically signed by:  Burney Gauze, PTA

## 2020-05-16 ENCOUNTER — Inpatient Hospital Stay: Admit: 2020-05-16 | Discharge: 2020-05-16 | Payer: PRIVATE HEALTH INSURANCE | Primary: Internal Medicine

## 2020-05-16 DIAGNOSIS — M545 Low back pain, unspecified: Secondary | ICD-10-CM

## 2020-05-16 NOTE — Other (Signed)
[]  [facility]  Therapy  [address]  [phone]  F: [phone] []  [place]  [address]   [address]  P: [phone]  F: [phone] [x]  [place]  [place]  P: [phone]  F: [phone] []  [place]  P: [phone]  F: [phone] []  [place]  [place]   [address]   [place]: [phone]  F: [phone]      Physical Therapy Daily Treatment NoteNote    Date:  05/16/2020  Patient Name:  Monica Wood    DOB:  07-24-1975  MRN: [id number]  Physician: Alex Gardener, MD                                                                     Insurance: [employer] (Unlimited Visits)  Medical Diagnosis: Low back pain, unspecified back pain laterality, unspecified chronicity, unspecified whether sciatica present, Fibromyalgia                    Rehab Codes: M54.5, M79.7  Onset Date: 03/15/20                                                                                         Next Dr.'s appt.: 05/24/20  Visit# / total visits: 14/16       Cancels/No Shows: 1/0    Subjective:  Patient reported she was sore upon R PF this date and that she recently "rested" on vacation which seemed to help some with symptoms. Continues to have difficulty with coming to a neutral stance after trunk flexion. Only slight change in symptoms since beginning therapy.    Pain:  [x]  Yes  []  No  Location: R posterior hip down leg   Pain Rating: (0-10 scale) 1-2/10  Pain altered Tx:  [x]  No  []  Yes  Action:  Comments:      Objective:   Modalities: CP R gluteal region- Held today  Precautions: General  Exercises:  Exercise  LB Reps/ Time Weight/ Level Comments      Nustep 8' ?? ?? x     ?? ?? ?? ??      HS step S 3x30" ?? ?? x     SB S  3x30"?? ?? ?? x     Hip flexor step S 3x30" ?? ?? x     Supine piriformis S 3x30 ?? ?? x     Figure 4 piriformis S 3x30" ?? ?? x     Prayer S 3x30" ?? ??      ??Keeling hip flexor S  3x30"?? ?? ??      ??Prone quad S 3x30"?? ?? ??      ?? ?? ?? ??      TA sets ??2x15, 5" ?? ??      TA marches ??2x15 ?? ??      TA heel hovers 2x12?? ?? ??      Palloff Press 2x12        Dead bugs with ball 3x12        Isometric crunches 3x12, 3"        palloff press 2x15 Red SC       ?? ?? ?? ??      ??Bridges 2x12, 5"?? Lime ?? x     ??Clamshells 2x15 Lime ?? x     SL Hip abd 2x15 Lime  x     Prone hip ext 2x10   x              Foam roller 4'   x     Lacrosse ball rollout 3'                 Lunges 15x ea   x     Chair squats 2x10  Foam pad  x     SL leg press 2x10 75#       T-band sidestepping 3L Lime       Step ups 2x10 8"  fwd, lateral x     Other: Hypervolt R gluteal region            Treatment Charges: Mins Units   []   Modalities     [x]   Ther Exercise 43 3   [x]   Manual Therapy 5 NC   []   Ther Activities     []   Aquatics     []   Vasocompression     []   Other: CP     Total Treatment time 48 3       Assessment: [x]  Progressing toward goals. Initiated session with Nu Step to facilitate LE movement and increased stamina. Followed with stretching in attempt to reduce noted tension and improve lumbar flexibility with good tolerance. Increased reps for lunges with pt noting she feels "unstable" at times however no LOB displayed. Increased step height to advance strength and stability with no c/o increased pain or difficulty with performance assessed. Applied manual via Hypervolt to R PF with pt verbalizing sig tenderness however able to tolerate. Will continue to progress as pain allows.        []  No change.     []  Other:  [x]  Patient would continue to benefit from skilled physical therapy services in order to: Improve bil LE/LB flexibility, improve core/hip strength, improve tolerance to activity, and help decrease pain to allow pt to return to working out  recreationally    Goals  MET NOT MET ON-  GOING  Details   Date Addressed: NA ?? ?? ?? ??   STG: To be met in 8 treatments  ??? ?? ?? ??   1. ???Pain: Decrease pain levels to 5/10 with ADLs to help ease all activities and mobility. [] ???? [] ???? [x] ???? Still up to 8/10??   2. ???ROM: Increase flexibility in bil LEs/LB grossly to help improve pain-free motion. [] ???? [] ???? [x] ???? Still with tightness in posterior glutes??   3. Pt will report better ability to sleep, helping to improve quality of sleep. [] ???? [] ???? [x] ???? Pt still reporting discomfort with sleeping??   4. Independent with Home Exercise Programs [x] ???? [] ???? [] ???? ??   ?? [] ???? [] ???? [] ???? ??  Date Addressed: NA ?? ?? ?? ??   LTG: To be met in 16 treatments ?? ?? ?? ??   1. Improve score on assessment tool Modified Oswestry from 32% impairment to less than 20% impairment, demonstrating improved tolerance to activity.?? [] ???? [] ???? [] ???? ??   2. Reduce pain levels to??2/10 or less with ADLs [] ???? [] ???? [] ???? ??   3. ???Strength: Increase bil hip/core strength to 4+/5 throughout to help improve all core stability and reduce need for compensations with activity. [] ???? [] ???? [] ???? ??   4. Pt will self-report ability to return to working out recreationally with no complaints or pain. ?? ?? ?? ??      ??  Patient goals: "Decreased or no pain, full ROM, ability to exercise w/o injury or pain"      Pt. Education:  [x]  Yes  []  No  [x]  Reviewed Prior HEP/Ed  Method of Education: [x]  Verbal  [x]  Demo  []  Written  Comprehension of Education:  [x]  Verbalizes understanding.  [x]  Demonstrates understanding.  [x]  Needs review.  []  Demonstrates/verbalizes HEP/Ed previously given.     Plan: [x]  Continue current frequency toward long and short term goals.    [x]  Specific Instructions for subsequent treatments: Continue tx per POC, continue to progress core/hip strength      Time In: 1:10 pm           Time Out: 2:10 pm    Electronically signed by:  Horace Porteous, [place]

## 2020-05-17 ENCOUNTER — Inpatient Hospital Stay: Admit: 2020-05-17 | Payer: PRIVATE HEALTH INSURANCE | Primary: Internal Medicine

## 2020-05-17 ENCOUNTER — Inpatient Hospital Stay: Admit: 2020-05-17 | Discharge: 2020-05-17 | Payer: PRIVATE HEALTH INSURANCE | Primary: Internal Medicine

## 2020-05-17 NOTE — Other (Signed)
[]  [place]  [address]  [phone]  F: [phone] []  [place]  [address]   [address]  P: [phone]  F: [phone] [x]  [place]  [place]  P: [phone]  F: [phone] []  [place]  P: [phone]  F: [phone] []  [place]  [place]   [address]   [place]: [phone]  F: [phone]      Physical Therapy Daily Treatment NoteNote    Date:  05/17/2020  Patient Name:  Monica Wood    DOB:  [birth year]  MRN: [id number]  Physician: Alex Gardener, MD                                                                     Insurance: [employer] (Unlimited Visits)  Medical Diagnosis: Low back pain, unspecified back pain laterality, unspecified chronicity, unspecified whether sciatica present, Fibromyalgia                    Rehab Codes: M54.5, M79.7  Onset Date: 03/15/20                                                                                         Next Dr.'s appt.: 05/24/20  Visit# / total visits: 15/16       Cancels/No Shows: 1/0    Subjective: R hip sore this morning, states increased soreness from her previous visit.      Pain:  [x]  Yes  []  No  Location: R posterior hip down leg   Pain Rating: (0-10 scale) 1-2/10  Pain altered Tx:  [x]  No  []  Yes  Action:  Comments:      Objective:   Modalities: CP R gluteal region- Held today  Precautions: General  Exercises:  Exercise  LB Reps/ Time Weight/ Level Comments      Nustep 10' ?? ?? x     ?? ?? ?? ??      HS step S 3x30" ?? ?? x     SB S 3x30"?? ?? ?? x     Hip flexor step S 3x30" ?? ??      Supine piriformis S 3x30 ?? ??      Figure 4 piriformis S 3x30" ?? ??      Prayer S 3x30" ?? ??      ??Keeling hip flexor S 3x30"?? ?? ??      ??Prone quad S  3x30"?? ?? ??      ?? ?? ?? ??      TA sets ??2x15, 5" ?? ??  TA marches ??2x15 ?? ??      TA heel hovers 2x12?? ?? ??      Palloff Press 2x12        Dead bugs with ball 3x12        Isometric crunches 3x12, 3"        palloff press 2x15 Red SC       ?? ?? ?? ??      ??Bridges 2x12, 5"?? Lime ?? x     ??Clamshells 2x15 Lime ?? x     SL Hip abd 2x15 Lime  x     Prone hip ext 2x10   x              Foam roller 4'   x     Lacrosse ball rollout 3'                 Lunges 15x ea   x     Chair squats 2x10  Foam pad  x     SL leg press 2x10 75#       T-band sidestepping 3L Lime       Step ups 2x10 8"  fwd, lateral x     Other: Hypervolt R gluteal region            Treatment Charges: Mins Units   []   Modalities     [x]   Ther Exercise 15 1   [x]   Manual Therapy     []   Ther Activities     []   Aquatics     []   Vasocompression     []   Other: CP     Total Treatment time         Assessment: [x]  Progressing toward goals. Pt completed warm up followed by completion of stretches. Good recall of stretches with proper technique used. Treatment was then taken over by Marthe Patch, PT to further address pain symptoms.         []  No change.     []  Other:  [x]  Patient would continue to benefit from skilled physical therapy services in order to: Improve bil LE/LB flexibility, improve core/hip strength, improve tolerance to activity, and help decrease pain to allow pt to return to working out recreationally    Goals  MET NOT MET ON-  GOING  Details   Date Addressed: NA ?? ?? ?? ??   STG: To be met in 8 treatments  ??? ?? ?? ??   1. ???Pain: Decrease pain levels to 5/10 with ADLs to help ease all activities and mobility. [] ???? [] ???? [x] ???? Still up to 8/10??   2. ???ROM: Increase flexibility in bil LEs/LB grossly to help improve pain-free motion. [] ???? [] ???? [x] ???? Still with tightness in posterior glutes??   3. Pt will report better ability to sleep, helping to improve quality of sleep. [] ???? [] ???? [x] ???? Pt still reporting discomfort with sleeping??   4. Independent with Home  Exercise Programs [x] ???? [] ???? [] ???? ??   ?? [] ???? [] ???? [] ???? ??   Date Addressed: NA ?? ?? ?? ??   LTG: To be met in 16 treatments ?? ?? ?? ??   1. Improve score on assessment tool Modified Oswestry from 32% impairment to less than 20% impairment, demonstrating improved tolerance to activity.?? [] ???? [] ???? [] ???? ??   2. Reduce pain levels to??2/10 or less with ADLs [] ???? [] ???? [] ???? ??   3. ???Strength: Increase bil hip/core strength to 4+/5 throughout to help improve all core stability and reduce  need for compensations with activity. [] ???? [] ???? [] ???? ??   4. Pt will self-report ability to return to working out recreationally with no complaints or pain. ?? ?? ?? ??      ??  Patient goals: "Decreased or no pain, full ROM, ability to exercise w/o injury or pain"      Pt. Education:  [x]  Yes  []  No  [x]  Reviewed Prior HEP/Ed  Method of Education: [x]  Verbal  [x]  Demo  []  Written  Comprehension of Education:  [x]  Verbalizes understanding.  [x]  Demonstrates understanding.  [x]  Needs review.  []  Demonstrates/verbalizes HEP/Ed previously given.     Plan: [x]  Continue current frequency toward long and short term goals.    [x]  Specific Instructions for subsequent treatments: Continue tx per POC, continue to progress core/hip strength      Time In: 8:05 am           Time Out: 8:28 am    Electronically signed by:  Burney Gauze, PTA

## 2020-05-17 NOTE — Other (Signed)
[]  [place]  [address]  [phone]  F: [phone] []  [place]  [address]   [address]  P: [phone]  F: [phone] [x]  [place]  [place]  P: [phone]  F: [phone] []  [place]  P: [phone]  F: [phone] []  [place]  [place]   [address]   [place]: [phone]  F: [phone]      Physical Therapy Daily Treatment NoteNote    Date:  05/17/2020  Patient Name:  Monica Wood    DOB:  September 15, 1975  MRN: [id number]  Physician: Alex Gardener, MD                                                                     Insurance: [employer] (Unlimited Visits)  Medical Diagnosis: Low back pain, unspecified back pain laterality, unspecified chronicity, unspecified whether sciatica present, Fibromyalgia                    Rehab Codes: M54.5, M79.7  Onset Date: 03/15/20                                                                                         Next Dr.'s appt.: 05/24/20  Visit# / total visits: 15/16       Cancels/No Shows: 1/0    Subjective:  Patient reports she continues to have L foot numbness, sciatica and pain in her R hip/buttock area especially with forward bending.. Continues to have difficulty with coming to a neutral stance after trunk flexion. Only slight change in symptoms since beginning therapy.  Pt seeing PT for DN/NMR this date for pain post-ex with PTA  Pain:  [x]  Yes  []  No  Location: R posterior hip down leg   Pain Rating: (0-10 scale) 4/10   Pain altered Tx:  [x]  No  []  Yes  Action:  Comments:      Objective:   Modalities: Manual therapy 20 min: ART/MFR to R hip ER's, piriformis, glut med and HS, LL distraction, sacral traction with L5-S1 distraction  along with pelvic diaphragm release, CSR/SER. Added DN to B hips   NMR 10 min: Added NIS to R hip 10 min  CP R gluteal region- Held today  Precautions: General  Exercises: SEE PTA note  Exercise  LB Reps/ Time Weight/ Level Comments      Nustep 8' ?? ?? x     ?? ?? ?? ??      HS step S 3x30" ?? ?? x     SB S  3x30"?? ?? ?? x     Hip flexor step S 3x30" ?? ?? x     Supine piriformis S 3x30 ?? ?? x     Figure 4 piriformis S 3x30" ?? ?? x     Prayer S 3x30" ?? ??      ??Keeling hip flexor S 3x30"?? ?? ??      ??Prone quad S 3x30"?? ?? ??      ?? ?? ?? ??      TA sets ??2x15, 5" ?? ??      TA marches ??2x15 ?? ??      TA heel hovers 2x12?? ?? ??      Palloff Press 2x12        Dead bugs with ball 3x12        Isometric crunches 3x12, 3"        palloff press 2x15 Red SC       ?? ?? ?? ??      ??Bridges 2x12, 5"?? Lime ?? x     ??Clamshells 2x15 Lime ?? x     SL Hip abd 2x15 Lime  x     Prone hip ext 2x10   x              Foam roller 4'   x     Lacrosse ball rollout 3'                 Lunges 15x ea   x     Chair squats 2x10  Foam pad  x     SL leg press 2x10 75#       T-band sidestepping 3L Lime       Step ups 2x10 8"  fwd, lateral x     Other: Hypervolt R gluteal region            Treatment Charges: Mins Units   []   Modalities     []   Ther Exercise     [x]   Manual Therapy 20 1   []   Ther Activities     []   Aquatics     []   Vasocompression     [x]   Other/NMR 10 1   Total Treatment time 30 2       Assessment: [x]  Progressing toward goals. Added manual therapy as above with NMR this date post-ex with good tolerance.     []  No change.     []  Other:  [x]  Patient would continue to benefit from skilled physical therapy services in order to: Improve bil LE/LB flexibility, improve core/hip strength, improve tolerance to activity, and help decrease pain to allow pt to return to working out recreationally    Goals  MET NOT MET ON-  GOING  Details   Date Addressed: NA ?? ?? ?? ??   STG: To be met in 8 treatments  ??? ?? ?? ??   1. ???Pain: Decrease pain levels to 5/10 with ADLs to help ease  all activities and mobility. [] ???? [] ???? [x] ???? Still up to 8/10??   2. ???ROM: Increase flexibility in bil LEs/LB grossly to help improve pain-free motion. [] ???? [] ???? [x] ???? Still with tightness in posterior glutes??   3. Pt will report better ability to sleep, helping to improve quality of sleep. [] ???? [] ???? [x] ???? Pt still reporting discomfort with sleeping??   4. Independent with Home Exercise Programs [x] ???? [] ???? [] ???? ??   ?? [] ???? [] ???? [] ???? ??   Date Addressed: NA ?? ?? ?? ??  LTG: To be met in 16 treatments ?? ?? ?? ??   1. Improve score on assessment tool Modified Oswestry from 32% impairment to less than 20% impairment, demonstrating improved tolerance to activity.?? [] ???? [] ???? [] ???? ??   2. Reduce pain levels to??2/10 or less with ADLs [] ???? [] ???? [] ???? ??   3. ???Strength: Increase bil hip/core strength to 4+/5 throughout to help improve all core stability and reduce need for compensations with activity. [] ???? [] ???? [] ???? ??   4. Pt will self-report ability to return to working out recreationally with no complaints or pain. ?? ?? ?? ??      ??  Patient goals: "Decreased or no pain, full ROM, ability to exercise w/o injury or pain"      Pt. Education:  [x]  Yes  []  No  [x]  Reviewed Prior HEP/Ed  Method of Education: [x]  Verbal  [x]  Demo  []  Written  Comprehension of Education:  [x]  Verbalizes understanding.  [x]  Demonstrates understanding.  [x]  Needs review.  []  Demonstrates/verbalizes HEP/Ed previously given.     Plan: [x]  Continue current frequency toward long and short term goals.    [x]  Specific Instructions for subsequent treatments: Continue tx per POC, continue to progress core/hip strength      Time In: 0830           Time Out: 0900    Electronically signed by:  Rochel Brome, PT

## 2020-05-24 ENCOUNTER — Ambulatory Visit
Admit: 2020-05-24 | Discharge: 2020-05-24 | Payer: PRIVATE HEALTH INSURANCE | Attending: Orthopaedic Surgery | Primary: Internal Medicine

## 2020-05-24 DIAGNOSIS — M7062 Trochanteric bursitis, left hip: Secondary | ICD-10-CM

## 2020-05-24 DIAGNOSIS — M7061 Trochanteric bursitis, right hip: Secondary | ICD-10-CM

## 2020-05-24 MED ORDER — BUPIVACAINE HCL 0.25 % IJ SOLN
0.25 % | Freq: Once | INTRAMUSCULAR | Status: AC
Start: 2020-05-24 — End: 2020-05-24
  Administered 2020-05-24: 13:00:00 5 mL via INTRA_ARTICULAR

## 2020-05-24 MED ORDER — BETAMETHASONE SOD PHOS & ACET 6 (3-3) MG/ML IJ SUSP
6 (3-3) MG/ML | Freq: Once | INTRAMUSCULAR | Status: AC
Start: 2020-05-24 — End: 2020-05-24
  Administered 2020-05-24: 13:00:00 6 mg via INTRA_ARTICULAR

## 2020-05-24 MED ORDER — LIDOCAINE HCL 1 % IJ SOLN
1 % | Freq: Once | INTRAMUSCULAR | Status: AC
Start: 2020-05-24 — End: 2020-05-24
  Administered 2020-05-24: 13:00:00 2 mL via INTRA_ARTICULAR

## 2020-05-24 NOTE — Progress Notes (Signed)
Patient ID: Monica Wood is a 44 y.o. female    Chief Compliant:  No chief complaint on file.       Diagnostic imaging:        Assessment and Plan:  1. Trochanteric bursitis of both hips      Lower back pain tolerable after completingPT    Bilateral trochanteric bursa injections provided    Follow up 12 weeks    Inject bilateral trochanteric bursitis    An informed verbal consent for the procedure was obtained and risks including, but not limited to: allergy to medications, injection, bleeding, stiffness of joint, recurrence of symptoms, loss of function, swelling, drainage, irrigation, need for surgery and pseudo-septic inflammation, were explained to the patient. Also, discussed was the potential for further injections, irrigation and debridement and surgery. Alternate means of treatment have also been discussed with the patient.    Administrations This Visit     betamethasone acetate-betamethasone sodium phosphate (CELESTONE) injection 6 mg     Admin Date  05/24/2020  08:12 Action  Given Dose  6 mg Route  Intra-artICUlar Site  Hip Right Administered By  Precious Reel    Ordering Provider: Virgie Dad, MD    NDC: [phone]    Lot#: [id number]    Manufacturer: Romelle Starcher    Patient Supplied?: No    Admin Date  05/24/2020  08:12 Action  Given Dose  6 mg Route  Intra-artICUlar Site  Hip Left Administered By  Precious Reel    Ordering Provider: Virgie Dad, MD    NDC: [phone]    Lot#: [id number]    Manufacturer: Romelle Starcher    Patient Supplied?: No          bupivacaine (MARCAINE) 0.25 % injection 5 mg     Admin Date  05/24/2020  08:13 Action  Given Dose  5 mg Route  Intra-artICUlar Site  Hip Right Administered By  Precious Reel    Ordering Provider: Virgie Dad, MD    NDC: [phone]    Lot#: [id number]    Manufacturer: HOSPIRA    Patient Supplied?: No    Admin Date  05/24/2020  08:13 Action  Given Dose  5 mg Route  Intra-artICUlar Site  Hip Left Administered By  Precious Reel    Ordering Provider: Virgie Dad, MD    NDC:  [phone]    Lot#: [id number]    Manufacturer: HOSPIRA    Patient Supplied?: No          lidocaine 1 % injection 2 mL     Admin Date  05/24/2020  08:14 Action  Given Dose  2 mL Route  Intra-artICUlar Site  Hip Right Administered By  Precious Reel    Ordering Provider: Virgie Dad, MD    NDC: [phone]    Lot#: [id number]    Manufacturer: HIKMA    Patient Supplied?: No    Admin Date  05/24/2020  08:14 Action  Given Dose  2 mL Route  Intra-artICUlar Site  Hip Left Administered By  Precious Reel    Ordering Provider: Virgie Dad, MD    NDC: [phone]    Lot#: [id number]    Manufacturer: HIKMA    Patient Supplied?: No                  HPI:  This is a 44 y.o. female who presents to the clinic today for low back follow up and hip pain.     Patient reports significant relief of low back  pain with PT.    Patient reports bilateral lateral hip pain.     Patient is requesting bilateral hip injections, previously had good relief with injections.      Review of Systems   All other systems reviewed and are negative.      Past History:    Current Outpatient Medications:   ???  DULoxetine (CYMBALTA) 30 MG extended release capsule, take 1 capsule by mouth once daily, Disp: , Rfl:   ???  traMADol (ULTRAM) 50 MG tablet, Take 50 mg by mouth every 8 hours as needed., Disp: , Rfl:   Allergies   Allergen Reactions   ??? Codeine      Anaphylaxis      Social History     Socioeconomic History   ??? Marital status: Married     Spouse name: Not on file   ??? Number of children: Not on file   ??? Years of education: Not on file   ??? Highest education level: Not on file   Occupational History   ??? Not on file   Tobacco Use   ??? Smoking status: Never Smoker   ??? Smokeless tobacco: Never Used   Substance and Sexual Activity   ??? Alcohol use: Not Currently   ??? Drug use: Not on file   ??? Sexual activity: Not on file   Other Topics Concern   ??? Not on file   Social History Narrative   ??? Not on file     Social Determinants of Health     Financial Resource Strain: Low  Risk    ??? Difficulty of Paying Living Expenses: Not hard at all   Food Insecurity: No Food Insecurity   ??? Worried About Programme researcher, broadcasting/film/video in the Last Year: Never true   ??? Ran Out of Food in the Last Year: Never true   Transportation Needs:    ??? Lack of Transportation (Medical): Not on file   ??? Lack of Transportation (Non-Medical): Not on file   Physical Activity:    ??? Days of Exercise per Week: Not on file   ??? Minutes of Exercise per Session: Not on file   Stress:    ??? Feeling of Stress : Not on file   Social Connections:    ??? Frequency of Communication with Friends and Family: Not on file   ??? Frequency of Social Gatherings with Friends and Family: Not on file   ??? Attends Religious Services: Not on file   ??? Active Member of Clubs or Organizations: Not on file   ??? Attends Banker Meetings: Not on file   ??? Marital Status: Not on file   Intimate Partner Violence:    ??? Fear of Current or Ex-Partner: Not on file   ??? Emotionally Abused: Not on file   ??? Physically Abused: Not on file   ??? Sexually Abused: Not on file   Housing Stability:    ??? Unable to Pay for Housing in the Last Year: Not on file   ??? Number of Places Lived in the Last Year: Not on file   ??? Unstable Housing in the Last Year: Not on file     Past Medical History:   Diagnosis Date   ??? Bursitis    ??? Fibromyalgia    ??? Scoliosis    ??? Tendinitis      Past Surgical History:   Procedure Laterality Date   ??? ABDOMINOPLASTY     ??? GALLBLADDER SURGERY     ???  TUBAL LIGATION       Family History   Problem Relation Age of Onset   ??? Diabetes Mother    ??? Diabetes Maternal Grandmother         Physical Exam:  Vitals signs and nursing note reviewed.   Constitutional:       Appearance: well-developed.   HENT:      Head: Normocephalic and atraumatic.      Nose: Nose normal.   Eyes:      Conjunctiva/sclera: Conjunctivae normal.   Neck:      Musculoskeletal: Normal range of motion and neck supple.   Pulmonary:      Effort: Pulmonary effort is normal. No respiratory  distress.   Musculoskeletal:      Comments: Normal gait     Skin:     General: Skin is warm and dry.   Neurological:      Mental Status: Alert and oriented to person, place, and time.      Sensory: No sensory deficit.   Psychiatric:         Behavior: Behavior normal.         Thought Content: Thought content normal.    Tenderness over the bilateral trochanteric bursas    Provider Attestation:  Shirlee More Glendal Cassaday, personally performed the services described in this documentation. All medical record entries made by the scribe were at my direction and in my presence. I have reviewed the chart and discharge instructions and agree that the records reflect my personal performance and is accurate and complete. Cliffton Asters Dion Body, MD 05/24/20     Scribe Attestation:  By signing my name below, I, Dorise Hiss, attest that this documentation has been prepared under the direction and in the presence of Dr. Carlean Purl. Electronically signed: Dorise Hiss, Scribe, 05/24/20     Please note that this chart was generated using voice recognition Dragon dictation software.  Although every effort was made to ensure the accuracy of this automated transcription, some errors in transcription may have occurred.

## 2020-05-30 ENCOUNTER — Encounter: Attending: Surgical | Primary: Internal Medicine

## 2020-06-04 ENCOUNTER — Inpatient Hospital Stay: Admit: 2020-06-04 | Payer: PRIVATE HEALTH INSURANCE | Primary: Internal Medicine

## 2020-06-04 NOTE — Other (Addendum)
[]  [place]  [address]  [phone]) [phone]  F: [phone] []  [place]  [address]   [address]  P: [phone]  F: [phone] [x]  [place]  [place]  P: [phone]  F: [phone] []  [place]  P: [phone]  F: [phone] []  [place]  [place]   [address]   [place]: [phone]  F: [phone]      Physical Therapy Daily Treatment Note/Re-Eval    Date:  06/04/2020  Patient Name:  Monica Wood    DOB:  Jul 31, 1975  MRN: [id number]  Physician: Alex Gardener, MD                                                                     Insurance: [employer] (Unlimited Visits)  Medical Diagnosis: Low back pain, unspecified back pain laterality, unspecified chronicity, unspecified whether sciatica present, Fibromyalgia                    Rehab Codes: M54.5, M79.7  Onset Date: 03/15/20                                                                                         Next Dr.'s appt.: 05/24/20  Visit# / total visits: 16/16       Cancels/No Shows: 1/0    Subjective:  Patient reports she has good and bad days. She did well after her last session with DN and CST/manual therapy. She is still having sciatica and pain in her R hip/buttock area especially with forward bending, however, she can now bend farther than she used to be able to..    Pain:  [x]  Yes  []  No  Location: R posterior hip down leg   Pain Rating: (0-10 scale) 5/10   Pain altered Tx:  [x]  No  []  Yes  Action:  Comments:      Objective:   Modalities: Manual therapy 50 min: ART/MFR to R hip ER's, piriformis, glut med and HS, LL distraction, sacral traction with L5-S1 distraction along with pelvic diaphragm release, respiratory  diaphragm release, hyoid release, OCB, dural traction, CSR/SER with VI & D. Added DN to B hips    NMR 10 min: Added NIS w/DN to R hip 10 min  CP R gluteal region- Held today  Precautions: General  Exercises: Rev only  Exercise  LB Reps/ Time Weight/ Level Comments      Nustep 8'     x  HS step S 3x30"     x     SB S 3x30"      x     Hip flexor step S 3x30"     x     Supine piriformis S 3x30     x     Figure 4 piriformis S 3x30"     x     Prayer S 3x30"           Keeling hip flexor S 3x30"            Prone quad S 3x30"                        TA sets  2x15, 5"          TA marches  2x15          TA heel hovers 2x12           Palloff Press 2x12        Dead bugs with ball 3x12        Isometric crunches 3x12, 3"        palloff press 2x15 Red SC                     Bridges 2x12, 5"  Lime   x      Clamshells 2x15 Lime   x     SL Hip abd 2x15 Lime  x     Prone hip ext 2x10   x              Foam roller 4'   x     Lacrosse ball rollout 3'                 Lunges 15x ea   x     Chair squats 2x10  Foam pad  x     SL leg press 2x10 75#       T-band sidestepping 3L Lime       Step ups 2x10 8"  fwd, lateral x     Other: Hypervolt R gluteal region            Treatment Charges: Mins Units   []   Modalities     []   Ther Exercise     [x]   Manual Therapy 50 3   []   Ther Activities     []   Aquatics     []   Vasocompression     [x]   Other/NMR 10 1   Total Treatment time 60 4       Assessment: [x]  Progressing toward goals. Added manual therapy as above with NMR this date post-ex with good tolerance.     []  No change.     [x]  Other: Since starting therapy, pt has stated that she has seen progress in her symptoms, however still notes that she is having issues in her gluteal region that limit bending and can cause increased pain. Currently, pt is being seen for dry needling as all previous therapy had helped, however was unable to give carryover of pain-free symptoms for long-term period. Pt states that dry needling has seemed to  help and would like to continue with this for a bit longer to see if she can make more progress. Plan to continue with current POC at 1-2x/week x additional 12 visits.- SB, PT  [x]  Patient would continue to benefit from skilled physical therapy services in order to: Improve bil LE/LB flexibility, improve  core/hip strength, improve tolerance to activity, and help decrease pain to allow pt to return to working out recreationally    Goals  MET NOT MET ON-  GOING  Details   Date Addressed: NA           STG: To be met in 8 treatments            1. ? Pain: Decrease pain levels to 5/10 with ADLs to help ease all activities and mobility. []   []   [x]   Still up to 8/10    2. ? ROM: Increase flexibility in bil LEs/LB grossly to help improve pain-free motion. []   []   [x]   Still with tightness in posterior glutes    3. Pt will report better ability to sleep, helping to improve quality of sleep. []   []   [x]   Pt still reporting discomfort with sleeping    4. Independent with Home Exercise Programs [x]   []   []         []   []   []       Date Addressed: NA           LTG: To be met in 16 treatments           1. Improve score on assessment tool Modified Oswestry from 32% impairment to less than 20% impairment, demonstrating improved tolerance to activity.  []   []   []       2. Reduce pain levels to 2/10 or less with ADLs []   []   []       3. ? Strength: Increase bil hip/core strength to 4+/5 throughout to help improve all core stability and reduce need for compensations with activity. []   []   []       4. Pt will self-report ability to return to working out recreationally with no complaints or pain.                 Patient goals: "Decreased or no pain, full ROM, ability to exercise w/o injury or pain"      Pt. Education:  [x]  Yes  []  No  [x]  Reviewed Prior HEP/Ed  Method of Education: [x]  Verbal  [x]  Demo  []  Written  Comprehension of Education:  [x]  Verbalizes understanding.  [x]  Demonstrates understanding.  [x]  Needs review.  []   Demonstrates/verbalizes HEP/Ed previously given.     Plan: []  Continue current frequency toward long and short term goals.    [x]  Specific Instructions for subsequent treatments: Pt would benefit from additional 12 visits at 1-2x/week- SB, PT      Time In: 1100           Time Out: 1200    Electronically signed by:  Rochel Brome, PT

## 2020-06-18 ENCOUNTER — Inpatient Hospital Stay: Admit: 2020-06-18 | Payer: PRIVATE HEALTH INSURANCE | Primary: Internal Medicine

## 2020-06-18 DIAGNOSIS — M545 Low back pain, unspecified: Secondary | ICD-10-CM

## 2020-06-18 DIAGNOSIS — Z0000 Encounter for general adult medical examination without abnormal findings: Secondary | ICD-10-CM

## 2020-06-18 NOTE — Other (Signed)
_0  [place]  [address]  [phone]  F: [phone] _1  [place]  [address]   [address]  P: [phone]  F: [phone] _2  [place]  [place]  P: [phone]  F: [phone] _3  [place]  P: [phone]  F: [phone] _4  [facility]  [place]   [address]   [place]: [phone]  F: [phone]      Physical Therapy Daily Treatment Note    Date:  06/18/2020  Patient Name:  Monica Wood    DOB:  1975-10-20  MRN: [id number]  Physician: Alex Gardener, MD                                                                     Insurance: [employer] (Unlimited Visits)  Medical Diagnosis: Low back pain, unspecified back pain laterality, unspecified chronicity, unspecified whether sciatica present, Fibromyalgia                    Rehab Codes: M54.5, M79.7  Onset Date: 03/15/20                                                                                         Next Dr.'s appt.: 05/24/20  Visit# / total visits: 17/28       Cancels/No Shows: 1/0    Subjective:  Patient reports overall she is doing better. She did well after her last session with DN and CST/manual therapy. She is still having sciatica and pain in her R hip/buttock area especially with forward bending, however, she can now bend farther than she used to be able to..    Pain:  _5  Yes  _6  No  Location: R posterior hip down leg   Pain Rating: (0-10 scale) 2-3/10   Pain altered Tx:  _7  No  _8  Yes  Action: See below      Objective:   Modalities: Manual therapy 50 min: ART/MFR to R hip ER's, piriformis, glut med and HS, LL distraction, sacral traction with L5-S1 distraction along with pelvic diaphragm release, respiratory diaphragm  release, hyoid release, OCB, dural traction, CSR/SER with VI & D and added HCT. Continued with DN to B hip ERs, glut meds, L-S spine    NMR 10 min: Continued NIS w/DN to R hip 10 min  CP R gluteal region- Held today  Precautions: General  Exercises: Rev only  Exercise  LB Reps/ Time Weight/ Level Comments      Nustep 8'     x  HS step S 3x30"     x     SB S 3x30"      x     Hip flexor step S 3x30"     x     Supine piriformis S 3x30     x     Figure 4 piriformis S 3x30"     x     Prayer S 3x30"           Keeling hip flexor S 3x30"            Prone quad S 3x30"                        TA sets  2x15, 5"          TA marches  2x15          TA heel hovers 2x12           Palloff Press 2x12        Dead bugs with ball 3x12        Isometric crunches 3x12, 3"        palloff press 2x15 Red SC                     Bridges 2x12, 5"  Lime   x      Clamshells 2x15 Lime   x     SL Hip abd 2x15 Lime  x     Prone hip ext 2x10   x              Foam roller 4'   x     Lacrosse ball rollout 3'                 Lunges 15x ea   x     Chair squats 2x10  Foam pad  x     SL leg press 2x10 75#       T-band sidestepping 3L Lime       Step ups 2x10 8"  fwd, lateral x     Other: Hypervolt R gluteal region    Treatment Charges: Mins Units   _0   Modalities     _1   Ther Exercise     _2   Manual Therapy 50 3   _3   Ther Activities     _4   Aquatics     _5   Vasocompression     _6   Other/NMR 10 1   Total Treatment time 60 4       Assessment: _7  Progressing toward goals. Continued with manual therapy as above with good tolerance. Pt reports overall feeling better with manual treatment.     _8  No change.     _9  Other:    _10  Patient would continue to benefit from skilled physical therapy services in order to: Improve bil LE/LB flexibility, improve core/hip strength, improve tolerance to activity, and help decrease pain to allow pt to return to working out recreationally    Goals  MET NOT MET ON-  GOING  Details   Date Addressed: NA            STG: To be met in 8 treatments            1. ? Pain: Decrease pain levels to 5/10 with ADLs to help ease all activities and mobility. _11   _12   _13   Still up to 8/10    2. ? ROM: Increase flexibility in bil LEs/LB grossly  to help improve pain-free motion. _0   _1   _2   Still with tightness in posterior glutes    3. Pt will report better ability to sleep, helping to improve quality of sleep. _3   _4   _5   Pt still reporting discomfort with sleeping    4. Independent with Home Exercise Programs _6   _7   _8         _9   _10   _11       Date Addressed: NA           LTG: To be met in 16 treatments           1. Improve score on assessment tool Modified Oswestry from 32% impairment to less than 20% impairment, demonstrating improved tolerance to activity.  _12   _13   _14       2. Reduce pain levels to 2/10 or less with ADLs _15   _16   _17       3. ? Strength: Increase bil hip/core strength to 4+/5 throughout to help improve all core stability and reduce need for compensations with activity. _18   _19   _20       4. Pt will self-report ability to return to working out recreationally with no complaints or pain.                 Patient goals: "Decreased or no pain, full ROM, ability to exercise w/o injury or pain"      Pt. Education:  _21  Yes  _22  No  _23  Reviewed Prior HEP/Ed  Method of Education: _24  Verbal  _25  Demo  _26  Written  Comprehension of Education:  _27  Verbalizes understanding.  _28  Demonstrates understanding.  _29  Needs review.  _30  Demonstrates/verbalizes HEP/Ed previously given.     Plan: _31  Continue current frequency toward long and short term goals.    _32  Specific Instructions for subsequent treatments: Pt would benefit from additional 12 visits at 1-2x/week- SB, PT      Time In: 1100           Time Out: 1200    Electronically signed by:  Rochel Brome, PT

## 2020-07-05 ENCOUNTER — Inpatient Hospital Stay: Admit: 2020-07-05 | Payer: PRIVATE HEALTH INSURANCE | Primary: Internal Medicine

## 2020-07-05 NOTE — Other (Signed)
_0  [place]  [address]  [phone]  F: [phone] _1  [place]  [address]   [address]  P: [phone]  F: [phone] _2  [place]  [place]  P: [phone]  F: [phone] _3  [place]  P: [phone]  F: [phone] _4  [facility]  Outpatient Rehabilitation &  Therapy  [place]   [address]   [place]: [phone]  F: [phone]      Physical Therapy Daily Treatment Note    Date:  07/05/2020  Patient Name:  Monica Wood    DOB:  05/19/1976  MRN: [id number]  Physician: Alex Gardener, MD                                                                     Insurance: [employer] (Unlimited Visits)  Medical Diagnosis: Low back pain, unspecified back pain bilaterality, unspecified chronicity, unspecified whether sciatica present, Fibromyalgia                    Rehab Codes: M54.5, M79.7  Onset Date: 03/15/20                                                                                         Next Dr.'s appt.: 05/24/20  Visit# / total visits: 18/28       Cancels/No Shows: 1/0    Subjective:  Patient reports overall she is doing ok. L hip/LE symptoms as well as her R hip symptoms are worse in the am, but getting better overall as long as she continues to stretch.  Forward bending is getting better as well.  Pain:  _5  Yes  _6  No  Location: R posterior hip down leg   Pain Rating: (0-10 scale) 2-3/10   Pain altered Tx:  _7  No  _8  Yes  Action: See below      Objective:   Modalities: Manual therapy 50 min: ART/MFR to R hip ER's, piriformis, glut med and HS, LL distraction, sacral traction with L5-S1 distraction along with pelvic diaphragm release, respiratory diaphragm release, hyoid release, OCB, dural traction, CSR/SER  with VI & D and added HCT. Continued with DN to B hip ERs, glut meds, L-S spine    NMR 10 min: Continued NIS w/DN to R hip 10 min  CP R gluteal region- Held today  Precautions: General  Exercises: Rev only  Exercise  LB Reps/ Time Weight/ Level Comments      Nustep 8'     x  HS step S 3x30"     x     SB S 3x30"      x     Hip flexor step S 3x30"     x     Supine piriformis S 3x30     x     Figure 4 piriformis S 3x30"     x     Prayer S 3x30"           Keeling hip flexor S 3x30"            Prone quad S 3x30"                        TA sets  2x15, 5"          TA marches  2x15          TA heel hovers 2x12           Palloff Press 2x12        Dead bugs with ball 3x12        Isometric crunches 3x12, 3"        palloff press 2x15 Red SC                     Bridges 2x12, 5"  Lime   x      Clamshells 2x15 Lime   x     SL Hip abd 2x15 Lime  x     Prone hip ext 2x10   x              Foam roller 4'   x     Lacrosse ball rollout 3'                 Lunges 15x ea   x     Chair squats 2x10  Foam pad  x     SL leg press 2x10 75#       T-band sidestepping 3L Lime       Step ups 2x10 8"  fwd, lateral x     Other: Hypervolt R gluteal region    Treatment Charges: Mins Units   _0   Modalities     _1   Ther Exercise     _2   Manual Therapy 50 3   _3   Ther Activities     _4   Aquatics     _5   Vasocompression     _6   Other/NMR 10 1   Total Treatment time 60 4       Assessment: _7  Progressing toward goals. Improved pain levels post-treatment. Continued with manual therapy as above with good tolerance. Pt reports overall feeling better with manual treatment.     _8  No change.     _9  Other:    _10  Patient would continue to benefit from skilled physical therapy services in order to: Improve bil LE/LB flexibility, improve core/hip strength, improve tolerance to activity, and help decrease pain to allow pt to return to working out recreationally    Goals  MET NOT MET ON-  GOING  Details   Date Addressed: NA           STG: To be met  in 8 treatments            1. ? Pain: Decrease pain levels to 5/10 with ADLs to help ease all activities and mobility. _11   _12   _13   Still up to 8/10    2. ? ROM: Increase flexibility  in bil LEs/LB grossly to help improve pain-free motion. _0   _1   _2   Still with tightness in posterior glutes    3. Pt will report better ability to sleep, helping to improve quality of sleep. _3   _4   _5   Pt still reporting discomfort with sleeping    4. Independent with Home Exercise Programs _6   _7   _8         _9   _10   _11       Date Addressed: NA           LTG: To be met in 16 treatments           1. Improve score on assessment tool Modified Oswestry from 32% impairment to less than 20% impairment, demonstrating improved tolerance to activity.  _12   _13   _14       2. Reduce pain levels to 2/10 or less with ADLs _15   _16   _17       3. ? Strength: Increase bil hip/core strength to 4+/5 throughout to help improve all core stability and reduce need for compensations with activity. _18   _19   _20       4. Pt will self-report ability to return to working out recreationally with no complaints or pain.                 Patient goals: "Decreased or no pain, full ROM, ability to exercise w/o injury or pain"      Pt. Education:  _21  Yes  _22  No  _23  Reviewed Prior HEP/Ed  Method of Education: _24  Verbal  _25  Demo  _26  Written  Comprehension of Education:  _27  Verbalizes understanding.  _28  Demonstrates understanding.  _29  Needs review.  _30  Demonstrates/verbalizes HEP/Ed previously given.     Plan: _31  Continue current frequency toward long and short term goals.    _32  Specific Instructions for subsequent treatments: Pt would benefit from additional 12 visits at 1-2x/week- SB, PT      Time In: 1315        Time Out: 1425    Electronically signed by:  Rochel Brome, PT

## 2020-07-25 ENCOUNTER — Inpatient Hospital Stay: Payer: BLUE CROSS/BLUE SHIELD | Primary: Internal Medicine

## 2020-07-25 ENCOUNTER — Ambulatory Visit
Admit: 2020-07-25 | Discharge: 2020-07-25 | Payer: BLUE CROSS/BLUE SHIELD | Attending: Adult Health | Primary: Internal Medicine

## 2020-07-25 DIAGNOSIS — Z1152 Encounter for screening for COVID-19: Secondary | ICD-10-CM

## 2020-07-25 DIAGNOSIS — J069 Acute upper respiratory infection, unspecified: Secondary | ICD-10-CM

## 2020-07-25 MED ORDER — ALBUTEROL SULFATE HFA 108 (90 BASE) MCG/ACT IN AERS
108 (90 Base) MCG/ACT | Freq: Four times a day (QID) | RESPIRATORY_TRACT | 0 refills | Status: AC | PRN
Start: 2020-07-25 — End: 2022-03-27

## 2020-07-25 NOTE — Progress Notes (Signed)
[facility], [facility]  [address]  [address]  [place] [address]  Dept: [phone]  Dept Fax: [phone]    Monica Wood is a 45 y.o. female who presents today for her medical conditions/complaints of   Chief Complaint   Patient presents with   ??? Cough     5 days   ??? Fever     off and on for 5 days   ??? Fatigue          HPI:     BP 130/80    Pulse 65    Temp 97.2 ??F (36.2 ??C) (Temporal)    Resp 16    Ht 5\' 4"  (1.626 m)    Wt 235 lb (106.6 kg)    SpO2 99%    BMI 40.34 kg/m??       HPI  Pt presented to the clinic today with c/o fever and chills - temp at home 99.9. This is a new problem. The current episode started 5 days ago. The problem has been unchanged since onset. Associated symptoms include: cough- little production, congestion, fatigue, body aches, headache, chest tightness, SOB with exertion .  Pertinent negatives include: No chest pain, abdominal pain, loss of taste, smell.  Pt has tried Mucinex and Robitussin with some improvement.    Vaccinated for COVID:  NO  Exposure to COVID:  NO  Employed at - PT at [employer].  Has not heard back from [facility]  Has inhaler at home- using with relief.   Home test positive Sunday 07/22/2020    Past Medical History:   Diagnosis Date   ??? Bursitis    ??? Fibromyalgia    ??? Scoliosis    ??? Tendinitis         Past Surgical History:   Procedure Laterality Date   ??? ABDOMINOPLASTY     ??? GALLBLADDER SURGERY     ??? TUBAL LIGATION         Family History   Problem Relation Age of Onset   ??? Diabetes Mother    ??? Diabetes Maternal Grandmother        Social History     Tobacco Use   ??? Smoking status: Never Smoker   ??? Smokeless tobacco: Never Used   Substance Use Topics   ??? Alcohol use: Not Currently        Prior to Visit Medications    Medication Sig Taking? Authorizing Provider   albuterol sulfate HFA (PROVENTIL HFA) 108 (90 Base) MCG/ACT inhaler Inhale 2 puffs into the lungs every 6 hours as needed for  Wheezing or Shortness of Breath Yes 09/19/2020, APRN - CNP   DULoxetine (CYMBALTA) 30 MG extended release capsule take 1 capsule by mouth once daily  Historical Provider, MD   traMADol (ULTRAM) 50 MG tablet Take 50 mg by mouth every 8 hours as needed.  Historical Provider, MD       Allergies   Allergen Reactions   ??? Codeine      Anaphylaxis          Subjective:      Review of Systems   Constitutional: Positive for chills, fatigue and fever.   HENT: Positive for congestion. Negative for ear pain, rhinorrhea and sore throat.    Eyes: Negative for pain and visual disturbance.   Respiratory: Positive for cough, chest tightness and shortness of breath.    Cardiovascular: Negative for chest pain, palpitations and  leg swelling.   Gastrointestinal: Negative for nausea and vomiting.   Genitourinary: Negative for decreased urine volume and difficulty urinating.   Musculoskeletal: Positive for arthralgias and myalgias. Negative for gait problem and neck pain.   Skin: Negative for pallor and rash.   Neurological: Positive for headaches. Negative for weakness and light-headedness.   Psychiatric/Behavioral: Negative for sleep disturbance.       Objective:     Physical Exam  Vitals and nursing note reviewed.   Constitutional:       General: She is not in acute distress.     Appearance: Normal appearance.   HENT:      Head: Normocephalic and atraumatic.      Right Ear: Tympanic membrane and ear canal normal.      Left Ear: Tympanic membrane and ear canal normal.      Nose: Congestion present.      Mouth/Throat:      Lips: Pink.      Mouth: Mucous membranes are moist.      Pharynx: Oropharynx is clear. Uvula midline.   Eyes:      Extraocular Movements: Extraocular movements intact.      Conjunctiva/sclera: Conjunctivae normal.   Cardiovascular:      Rate and Rhythm: Normal rate and regular rhythm.      Pulses: Normal pulses.   Pulmonary:      Effort: Pulmonary effort is normal. No tachypnea.      Breath sounds: Normal breath  sounds. No wheezing, rhonchi or rales.   Abdominal:      General: Bowel sounds are normal.      Palpations: Abdomen is soft.   Musculoskeletal:         General: Normal range of motion.      Cervical back: Normal range of motion and neck supple.   Skin:     General: Skin is warm and dry.      Capillary Refill: Capillary refill takes less than 2 seconds.      Coloration: Skin is not pale.   Neurological:      Mental Status: She is alert and oriented to person, place, and time.      Coordination: Coordination normal.      Gait: Gait normal.   Psychiatric:         Mood and Affect: Mood normal.         Thought Content: Thought content normal.           MEDICAL DECISION MAKING Assessment/Plan:     Romy was seen today for cough, fever and fatigue.    Diagnoses and all orders for this visit:    URI with cough and congestion    Encounter for screening for COVID-19  -     COVID-19; Future    Chest tightness  -     albuterol sulfate HFA (PROVENTIL HFA) 108 (90 Base) MCG/ACT inhaler; Inhale 2 puffs into the lungs every 6 hours as needed for Wheezing or Shortness of Breath    SOB (shortness of breath) on exertion  -     albuterol sulfate HFA (PROVENTIL HFA) 108 (90 Base) MCG/ACT inhaler; Inhale 2 puffs into the lungs every 6 hours as needed for Wheezing or Shortness of Breath    Close exposure to COVID-19 virus        Results for orders placed or performed during the hospital encounter of 04/18/20   TSH With Reflex Ft4   Result Value Ref Range    TSH 1.09 0.30 -  5.00 mIU/L   Hemoglobin A1C   Result Value Ref Range    Hemoglobin A1C 5.5 4.0 - 6.0 %    Estimated Avg Glucose 111 mg/dL     Based on the history and exam, I suspect COVID-19/ viral illness   Will send out COVID19 testing.   Albuterol inhaler as directed for chest tightness/ SOB  Possible treatment alterations based on the results.  Patient instructed to self-quarantine until testing results are back- and to follow the quarantine instructions in the after visit  summary.    Tylenol / Motrin as directed on the bottle as needed for fever/pain.  Increase fluids, rest.   The patient indicates understanding of these issues and agrees with the plan.  Educational materials provided on AVS.  Follow up if symptoms do not improve/worsen. Call with any questions or concerns.   Discussed symptoms that will warrant urgent ED evaluation/treatment.    Preventing the Spread of Coronavirus Disease 2019 in Homes and Residential Communities:  For the most recent information go to: [employer]    Patient given educational materials - see patientinstructions.  Discussed use, benefit, and side effects of prescribed medications.  All patient questions answered. Pt verbalized understanding.  Instructed to continue current medications, diet and exercise.  Patient agreed with treatment plan. Follow up as directed.     Electronically signed by Bunnie Domino, APRN - CNP on 07/25/2020 at 10:41 AM

## 2020-07-25 NOTE — Patient Instructions (Signed)
Learning About Coronavirus (COVID-19)  Coronavirus (COVID-19): Overview  What is coronavirus (COVID-19)?  The coronavirus disease (COVID-19) is caused by a virus. It is an illness that was first found in [place], [place], in December 2019. It has since spread worldwide.  The virus can cause fever, cough, and trouble breathing. In severe cases, it can cause pneumonia and make it hard to breathe without help. It can cause death.  Coronaviruses are a large group of viruses. They cause the common cold. They also cause more serious illnesses like Middle East respiratory syndrome (MERS) and severe acute respiratory syndrome (SARS). COVID-19 is caused by a novel coronavirus. That means it's a new type that has not been seen in people before.  This virus spreads person-to-person through droplets from coughing and sneezing. It can also spread when you are close to someone who is infected. And it can spread when you touch something that has the virus on it, such as a doorknob or a tabletop.  What can you do to protect yourself from coronavirus (COVID-19)?  The best way to protect yourself from getting sick is to:  ?? Avoid areas where there is an outbreak.  ?? Avoid contact with people who may be infected.  ?? Wash your hands often with soap or alcohol-based hand sanitizers.  ?? Avoid crowds and try to stay at least 6 feet away from other people.  ?? Wash your hands often, especially after you cough or sneeze. Use soap and water, and scrub for at least 20 seconds. If soap and water aren't available, use an alcohol-based hand sanitizer.  ?? Avoid touching your mouth, nose, and eyes.  What can you do to avoid spreading the virus to others?  To help avoid spreading the virus to others:  ?? Cover your mouth with a tissue when you cough or sneeze. Then throw the tissue in the trash.  ?? Use a disinfectant to clean things that you touch often.  ?? Wear a cloth face cover if you have to go to public areas.  ?? Stay home if you are sick or have  been exposed to the virus. Don't go to school, work, or public areas. And don't use public transportation.  ?? If you are sick:  ? Leave your home only if you need to get medical care. But call the doctor's office first so they know you're coming. And wear a face cover.  ? Wear the face cover whenever you're around other people. It can help stop the spread of the virus when you cough or sneeze.  ? Clean and disinfect your home every day. Use household cleaners and disinfectant wipes or sprays. Take special care to clean things that you grab with your hands. These include doorknobs, remote controls, phones, and handles on your refrigerator and microwave. And don't forget countertops, tabletops, bathrooms, and computer keyboards.  When to call for help  Call911 anytime you think you may need emergency care. For example, call if:  ?? You have severe trouble breathing. (You can't talk at all.)  ?? You have constant chest pain or pressure.  ?? You are severely dizzy or lightheaded.  ?? You are confused or can't think clearly.  ?? Your face and lips have a blue color.  ?? You pass out (lose consciousness) or are very hard to wake up.  Call your doctor now if you develop symptoms such as:  ?? Shortness of breath.  ?? Fever.  ?? Cough.  If you need to get care,   call ahead to the doctor's office for instructions before you go. Make sure you wear a face cover to prevent exposing other people to the virus.  Where can you get the latest information?  The following health organizations are tracking and studying this virus. Their websites contain the most up-to-date information. You'll also learn what to do if you think you may have been exposed to the virus.  ?? U.[place]. Centers for Disease Control and Prevention (CDC): The CDC provides updated news about the disease and travel advice. The website also tells you how to prevent the spread of infection. www.cdc.gov  ?? World Health Organization (WHO): WHO offers information about the virus  outbreaks. WHO also has travel advice. www.who.int  Current as of: November 05, 2018               Content Version: 12.4  ?? 2006-2020 [employer].   Care instructions adapted under license by your healthcare professional. If you have questions about a medical condition or this instruction, always ask your healthcare professional. [employer] disclaims any warranty or liability for your use of this information.    Coronavirus (COVID-19): Care Instructions  Overview  The coronavirus disease (COVID-19) is caused by a virus. It causes a fever, a cough, and shortness of breath. It mainly spreads person-to-person through droplets from coughing and sneezing. The virus also can spread when people are in close contact with someone who is infected.  Most people have mild symptoms and can take care of themselves at home. If their symptoms get worse, they may need care in a hospital. There is no medicine to fight the virus.  It's important to not spread the virus to others. If you have COVID-19, wear a face cover anytime you are around other people. You need to isolate yourself while you are sick. Your doctor will tell you when you no longer need to be isolated. Leave your home only if you need to get medical care.  Follow-up care is a key part of your treatment and safety. Be sure to make and go to all appointments, and call your doctor if you are having problems. It's also a good idea to know your test results and keep a list of the medicines you take.  How can you care for yourself at home?  ?? Get extra rest. It can help you feel better.  ?? Drink plenty of fluids. This helps replace fluids lost from fever. Fluids also help ease a scratchy throat. Water, soup, fruit juice, and hot tea with lemon are good choices.  ?? Take acetaminophen (such as Tylenol) to reduce a fever. It may also help with muscle aches. Read and follow all instructions on the label.  ?? Sponge your body with lukewarm water to help  with fever. Don't use cold water or ice.  ?? Use petroleum jelly on sore skin. This can help if the skin around your nose and lips becomes sore from rubbing a lot with tissues.  Tips for isolation  ?? Wear a cloth face cover when you are around other people. It can help stop the spread of the virus when you cough or sneeze.  ?? Limit contact with people in your home. If possible, stay in a separate bedroom and use a separate bathroom.  ?? Avoid contact with pets and other animals.  ?? Cover your mouth and nose with a tissue when you cough or sneeze. Then throw it in the trash right away.  ?? Wash your hands   often, especially after you cough or sneeze. Use soap and water, and scrub for at least 20 seconds. If soap and water aren't available, use an alcohol-based hand sanitizer.  ?? Don't share personal household items. These include bedding, towels, cups and glasses, and eating utensils.  ?? Clean and disinfect your home every day. Use household cleaners and disinfectant wipes or sprays. Take special care to clean things that you grab with your hands. These include doorknobs, remote controls, phones, and handles on your refrigerator and microwave. And don't forget countertops, tabletops, bathrooms, and computer keyboards.  When should you call for help?   Call911 anytime you think you may need emergency care. For example, call if you have life-threatening symptoms, such as:  ?? You have severe trouble breathing. (You can't talk at all.)  ?? You have constant chest pain or pressure.  ?? You are severely dizzy or lightheaded.  ?? You are confused or can't think clearly.  ?? Your face and lips have a blue color.  ?? You pass out (lose consciousness) or are very hard to wake up.  Call your doctor now or seek immediate medical care if:  ?? You have moderate trouble breathing. (You can't speak a full sentence.)  ?? You are coughing up blood (more than about 1 teaspoon).  ?? You have signs of low blood pressure. These include feeling  lightheaded; being too weak to stand; and having cold, pale, clammy skin.  Watch closely for changes in your health, and be sure to contact your doctor if:  ?? Your symptoms get worse.  ?? You are not getting better as expected.  Call before you go to the doctor's office.  Follow their instructions. And wear a cloth face cover.  Current as of: November 05, 2018               Content Version: 12.4  ?? 2006-2020 [employer].   Care instructions adapted under license by your healthcare professional. If you have questions about a medical condition or this instruction, always ask your healthcare professional. [employer] disclaims any warranty or liability for your use of this information.

## 2020-07-30 LAB — COVID-19: SARS-CoV-2: DETECTED — AB

## 2020-07-30 NOTE — Telephone Encounter (Signed)
Called and lm informing patient that her FMLA Monica Wood has been completed and can be picked up in office.

## 2020-08-01 NOTE — Telephone Encounter (Signed)
Last Visit Date: 04/18/2020   Next Visit Date: 10/31/2020

## 2020-08-01 NOTE — Telephone Encounter (Signed)
Tramadol has not been filled by Dr Clayborne Dana  She will need to contact the most recent ordering provider for this medication  duloxetine refill sent  Thanks

## 2020-08-02 MED ORDER — DULOXETINE HCL 30 MG PO CPEP
30 MG | ORAL_CAPSULE | ORAL | 2 refills | Status: DC
Start: 2020-08-02 — End: 2020-10-13

## 2020-08-02 NOTE — Telephone Encounter (Signed)
Left voicemail asking patient to return call to office

## 2020-08-14 ENCOUNTER — Encounter

## 2020-08-14 DIAGNOSIS — M797 Fibromyalgia: Secondary | ICD-10-CM

## 2020-08-14 MED ORDER — TRAMADOL HCL 50 MG PO TABS
50 MG | ORAL_TABLET | Freq: Three times a day (TID) | ORAL | 2 refills | Status: AC | PRN
Start: 2020-08-14 — End: 2020-09-13

## 2020-08-14 NOTE — Telephone Encounter (Signed)
Last Visit Date: 04/18/2020   Next Visit Date: 10/31/2020

## 2020-08-14 NOTE — Other (Signed)
[]   [facility] &  Therapy  [address]    [phone]  F: [phone]   []  [facility]  Outpatient Rehabilitation &  Therapy  [address]   [address]  P: [phone]  F: [phone]  []  [facility]  Outpatient Rehabilitation &  Therapy  [address]  [address]  P: [phone]  F: [phone] []  [facility]  Outpatient Rehabilitation &  Therapy  [address]  P: [phone]  F: [phone]  []  [facility]  Outpatient Rehabilitation &  Therapy  [address]   [address]   [phone]: [phone]  F: [phone]   []  [facility] & Therapy  [address]  [phone]: [phone]   F: [phone]     Physical Therapy Cancel/No Show note    Date: 08/14/2020  Patient: Monica Wood  DOB: Oct 27, 1975  MRN: [id number]    Cancels/No Shows to date: 1/0    For today's appointment patient:    [x]   Cancelled    [x]  Rescheduled appointment    []  No-show     Reason given by patient:    []   Patient ill    []   Conflicting appointment    []  No transportation      []  Conflict with work    []  No reason given    []  Weather related    []  COVID-19    []  Other:      Comments:        []  Next appointment was confirmed    Electronically signed by: 10/12/2020

## 2020-08-15 ENCOUNTER — Inpatient Hospital Stay: Payer: BLUE CROSS/BLUE SHIELD | Primary: Internal Medicine

## 2020-08-16 ENCOUNTER — Encounter: Attending: Physician Assistant | Primary: Internal Medicine

## 2020-08-16 ENCOUNTER — Encounter: Attending: Orthopaedic Surgery | Primary: Internal Medicine

## 2020-08-23 ENCOUNTER — Encounter: Attending: Physician Assistant | Primary: Internal Medicine

## 2020-08-30 ENCOUNTER — Inpatient Hospital Stay: Admit: 2020-08-30 | Payer: BLUE CROSS/BLUE SHIELD | Primary: Internal Medicine

## 2020-08-30 NOTE — Other (Signed)
[]  [facility] &  Therapy  [address]  [phone]  F: [phone] []  [place]  [address]   [address]  P: [phone]  F: [phone] [x]  [place]  [place]  P: [phone]  F: [phone] []  [place]  P: [phone]  F: [phone] []  [place]  [place]   [address]   [place]: [phone]  F: [phone]      Physical Therapy Daily Treatment Note    Date:  08/30/2020  Patient Name:  Monica Wood    DOB:  1976/03/19  MRN: [id number]  Physician: Alex Gardener, MD                                                                     Insurance: [employer] (Unlimited Visits)  Medical Diagnosis: Low back pain, unspecified back pain bilaterality, unspecified chronicity, unspecified whether sciatica present, Fibromyalgia                    Rehab Codes: M54.5, M79.7  Onset Date: 03/15/20                                                                                         Next Dr.'s appt.: 05/24/20  Visit# / total visits: 19/28       Cancels/No Shows: 1/0    Subjective:  Patient reports overall she is doing ok. Having more soreness since working out yesterday. Hasn't been able to do as much after getting COVID and being sick for a while. Just now trying to get back to doing more activity and able to make some changes at her work station to help with positioning and back/hip pain.   Pain:  [x]  Yes  []  No  Location: R posterior hip down leg   Pain Rating: (0-10 scale) 5-6/10   Pain altered Tx:  []  No  [x]  Yes  Action: See below      Objective:   Modalities: Manual therapy 35 min: ART/MFR to R hip ER's, piriformis, glut med and HS, LL distraction, sacral traction with L5-S1 distraction along with  pelvic diaphragm release, respiratory diaphragm release, hyoid release, OCB, dural traction, CSR/SER with VI & D and added HCT. Continued with DN to B hip ERs, glut meds, L-S spine    NMR 10 min: Continued NIS w/DN to R hip 10 min   CP R gluteal region- Held today  Precautions: General  Exercises: Rev only  Exercise  LB Reps/ Time Weight/ Level Comments      Nustep 8'  x                  HS step S 3x30"     x     SB S 3x30"      x     Hip flexor step S 3x30"     x     Supine piriformis S 3x30     x     Figure 4 piriformis S 3x30"     x     Prayer S 3x30"           Keeling hip flexor S 3x30"            Prone quad S 3x30"                        TA sets  2x15, 5"          TA marches  2x15          TA heel hovers 2x12           Palloff Press 2x12        Dead bugs with ball 3x12        Isometric crunches 3x12, 3"        palloff press 2x15 Red SC                     Bridges 2x12, 5"  Lime   x      Clamshells 2x15 Lime   x     SL Hip abd 2x15 Lime  x     Prone hip ext 2x10   x              Foam roller 4'   x     Lacrosse ball rollout 3'                 Lunges 15x ea   x     Chair squats 2x10  Foam pad  x     SL leg press 2x10 75#       T-band sidestepping 3L Lime       Step ups 2x10 8"  fwd, lateral x     Other: Hypervolt R gluteal region    Treatment Charges: Mins Units   []   Modalities     []   Ther Exercise     [x]   Manual Therapy 35 2   []   Ther Activities     []   Aquatics     []   Vasocompression     [x]   Other/NMR 10 1   Total Treatment time 45 3       Assessment: [x]  Progressing toward goals. Improved pain levels post-treatment. Continued with manual therapy as above with good tolerance. Pt reports overall feeling better with manual treatment.     []  No change.     []  Other:    [x]  Patient would continue to benefit from skilled physical therapy services in order to: Improve bil LE/LB flexibility, improve core/hip strength, improve tolerance to activity, and help decrease pain to allow pt to return to working out  recreationally    Goals  MET NOT MET ON-  GOING  Details   Date Addressed: NA           STG: To be met in 8 treatments            1. ? Pain: Decrease pain levels to 5/10 with ADLs to help ease all activities and mobility. []   []   [  x]  Still up to 8/10    2. ? ROM: Increase flexibility in bil LEs/LB grossly to help improve pain-free motion. []   []   [x]   Still with tightness in posterior glutes    3. Pt will report better ability to sleep, helping to improve quality of sleep. []   []   [x]   Pt still reporting discomfort with sleeping    4. Independent with Home Exercise Programs [x]   []   []         []   []   []       Date Addressed: NA           LTG: To be met in 16 treatments           1. Improve score on assessment tool Modified Oswestry from 32% impairment to less than 20% impairment, demonstrating improved tolerance to activity.  []   []   []       2. Reduce pain levels to 2/10 or less with ADLs []   []   []       3. ? Strength: Increase bil hip/core strength to 4+/5 throughout to help improve all core stability and reduce need for compensations with activity. []   []   []       4. Pt will self-report ability to return to working out recreationally with no complaints or pain.                 Patient goals: "Decreased or no pain, full ROM, ability to exercise w/o injury or pain"      Pt. Education:  [x]  Yes  []  No  [x]  Reviewed Prior HEP/Ed  Method of Education: [x]  Verbal  [x]  Demo  []  Written  Comprehension of Education:  [x]  Verbalizes understanding.  [x]  Demonstrates understanding.  [x]  Needs review.  []  Demonstrates/verbalizes HEP/Ed previously given.     Plan: []  Continue current frequency toward long and short term goals.    [x]  Specific Instructions for subsequent treatments: Pt would benefit from additional 12 visits at 1-2x/week- SB, PT      Time In: 1215        Time Out: 1300    Electronically signed by:  Rochel Brome, PT

## 2020-09-04 ENCOUNTER — Inpatient Hospital Stay: Admit: 2020-09-04 | Payer: BLUE CROSS/BLUE SHIELD | Primary: Internal Medicine

## 2020-09-04 NOTE — Other (Signed)
[] [facility] &  Therapy  [address]  [phone]  F: [phone] [] [place]  [address]   [address]  P: [phone]  F: [phone] [x] [place] &  Therapy  [place]  P: [phone]  F: [phone] [] [place]  P: [phone]  F: [phone] [] [place]  [place]   [address]   [place]: [phone]  F: [phone]      Physical Therapy Daily Treatment Note    Date:  09/04/2020  Patient Name:  Monica Wood    DOB:  02-16-1976  MRN: [id number]  Physician: Alex Gardener, MD                                                                     Insurance: [employer] (Unlimited Visits)  Medical Diagnosis: Low back pain, unspecified back pain bilaterality, unspecified chronicity, unspecified whether sciatica present, Fibromyalgia                    Rehab Codes: M54.5, M79.7  Onset Date: 03/15/20                                                                                         Next Dr.'s appt.: 05/24/20  Visit# / total visits: 20/28       Cancels/No Shows: 1/0    Subjective:  Patient reports overall she is feeling a little better. Notes she has had a "cramping" feeling over the last couple of days. Trying to get some squats and other exercises in when she can. Just now trying to get back to doing more activity and able to make some changes at her work station to help with positioning and back/hip pain.   Pain:  [x] Yes  [] No  Location: R posterior hip down leg   Pain Rating: (0-10 scale) 3-4/10   Pain altered Tx:  [] No  [x] Yes  Action: See below      Objective:   Modalities: Manual therapy 50 min: ART/MFR to R hip ER's, piriformis, glut med and HS, LL distraction, sacral traction with L5-S1  distraction along with pelvic diaphragm release, respiratory diaphragm release, hyoid release, OCB, dural traction, CSR/SER with VI & D and added HCT. Continued with DN to B hip ERs, glut meds, L-S spine    NMR 10 min: Continued NIS w/DN to R hip 10 min    CP R gluteal region- Held today  Precautions: General  Exercises: Rev only  Exercise  LB Reps/ Time Weight/ Level Comments  Nustep 8'     x                  HS step S 3x30"     x     SB S 3x30"      x     Hip flexor step S 3x30"     x     Supine piriformis S 3x30     x     Figure 4 piriformis S 3x30"     x     Prayer S 3x30"           Keeling hip flexor S 3x30"            Prone quad S 3x30"                        TA sets  2x15, 5"          TA marches  2x15          TA heel hovers 2x12           Palloff Press 2x12        Dead bugs with ball 3x12        Isometric crunches 3x12, 3"        palloff press 2x15 Red SC                     Bridges 2x12, 5"  Lime   x      Clamshells 2x15 Lime   x     SL Hip abd 2x15 Lime  x     Prone hip ext 2x10   x              Foam roller 4'   x     Lacrosse ball rollout 3'                 Lunges 15x ea   x     Chair squats 2x10  Foam pad  x     SL leg press 2x10 75#       T-band sidestepping 3L Lime       Step ups 2x10 8"  fwd, lateral x     Other: Hypervolt R gluteal region    Treatment Charges: Mins Units   []  Modalities     []  Ther Exercise     [x]  Manual Therapy 50 3   []  Ther Activities     []  Aquatics     []  Vasocompression     [x]  Other/NMR 10 1   Total Treatment time 60 4       Assessment: [x] Progressing toward goals. Improved pain levels and sense of well-being post-treatment. Continued with manual therapy as above with good tolerance. Pt reports overall feeling better with manual treatment.     [] No change.     [] Other:    [x] Patient would continue to benefit from skilled physical therapy services in order to: Improve bil LE/LB flexibility, improve core/hip strength, improve tolerance to activity, and help  decrease pain to allow pt to return to working out recreationally    Goals  MET NOT MET ON-  GOING  Details   Date Addressed: NA           STG: To be met in 8 treatments            1. ? Pain: Decrease pain levels to  5/10 with ADLs to help ease all activities and mobility. []  []  [x]  Still up to 8/10    2. ? ROM: Increase flexibility in bil LEs/LB grossly to help improve pain-free motion. []  []  [x]  Still with tightness in posterior glutes    3. Pt will report better ability to sleep, helping to improve quality of sleep. []  []  [x]  Pt still reporting discomfort with sleeping    4. Independent with Home Exercise Programs [x]  []  []        []  []  []      Date Addressed: NA           LTG: To be met in 16 treatments           1. Improve score on assessment tool Modified Oswestry from 32% impairment to less than 20% impairment, demonstrating improved tolerance to activity.  []  []  []      2. Reduce pain levels to 2/10 or less with ADLs []  []  []      3. ? Strength: Increase bil hip/core strength to 4+/5 throughout to help improve all core stability and reduce need for compensations with activity. []  []  []      4. Pt will self-report ability to return to working out recreationally with no complaints or pain.                 Patient goals: "Decreased or no pain, full ROM, ability to exercise w/o injury or pain"      Pt. Education:  [x] Yes  [] No  [x] Reviewed Prior HEP/Ed  Method of Education: [x] Verbal  [x] Demo  [] Written  Comprehension of Education:  [x] Verbalizes understanding.  [x] Demonstrates understanding.  [x] Needs review.  [] Demonstrates/verbalizes HEP/Ed previously given.     Plan: [] Continue current frequency toward long and short term goals.    [x] Specific Instructions for subsequent treatments: Pt would benefit from additional 12 visits at 1-2x/week- SB, PT      Time In: 1300        Time Out: 1400    Electronically signed by:  Rochel Brome, PT

## 2020-10-15 MED ORDER — DULOXETINE HCL 30 MG PO CPEP
30 MG | ORAL_CAPSULE | ORAL | 2 refills | Status: AC
Start: 2020-10-15 — End: 2022-01-16

## 2020-10-15 NOTE — Telephone Encounter (Signed)
Last Visit Date:04/18/20  Next Visit Date: 10/31/20

## 2020-10-17 ENCOUNTER — Encounter: Attending: Internal Medicine | Primary: Internal Medicine

## 2020-10-22 ENCOUNTER — Inpatient Hospital Stay: Admit: 2020-10-22 | Payer: BLUE CROSS/BLUE SHIELD | Primary: Internal Medicine

## 2020-10-22 DIAGNOSIS — M545 Low back pain, unspecified: Secondary | ICD-10-CM

## 2020-10-22 NOTE — Other (Signed)
$'[]'Z$  [facility]  [facility]  [address]  [phone]  F: [phone] '[]'$  [place]  [address]   [address]  P: [phone]  F: [phone] '[x]'$  [place]  [place]  P: [phone]  F: [phone] '[]'$  [place]  P: [phone]  F: [phone] '[]'$  [place]  [address]   [address]   [place]: [phone]  F: [phone]      Physical Therapy Daily Treatment Note    Date:  10/22/2020  Patient Name:  Monica Wood    DOB:  04-19-1976  MRN: [id number]  Physician: Alex Gardener, MD                                                                     Insurance: [employer] (Unlimited Visits)  Medical Diagnosis: Low back pain, unspecified back pain bilaterality, unspecified chronicity, unspecified whether sciatica present, Fibromyalgia                    Rehab Codes: M54.5, M79.7  Onset Date: 03/15/20                                                                                         Next Dr.'s appt.: 05/24/20  Visit# / total visits: 21/28       Cancels/No Shows: 1/0    Subjective:  Patient reports overall she is feeling better, but sore after standing and working a lot organizing her house over the weekend. She thinks she is getting closer to discharge. Feeling like she is getting stronger overall also.   Pain:  '[x]'$  Yes  '[]'$  No  Location: R posterior hip down leg   Pain Rating: (0-10 scale) 4-5/10   Pain altered Tx:  '[]'$  No  '[x]'$  Yes  Action: See below    Objective:   Modalities: Manual therapy 25 min: ART/MFR to R hip ER's, piriformis, glut med and HS, LL distraction, sacral traction with L5-S1 distraction along with pelvic diaphragm release, respiratory diaphragm release, hyoid release, OCB, dural traction,  CSR/SER with VI & D and added HCT, added Hypervolt to R hip/L-S spine. Continued with 5 DN to B hip ERs, glut meds, L-S spine    NMR 10 min: Continued NIS w/DN to R hip 10 min     CP R gluteal region- Held today  Precautions: General  Exercises: Rev only  Exercise  LB Reps/ Time Weight/ Level Comments      Nustep 8'     x  HS step S 3x30"     x     SB S 3x30"      x     Hip flexor step S 3x30"     x     Supine piriformis S 3x30     x     Figure 4 piriformis S 3x30"     x     Prayer S 3x30"           Keeling hip flexor S 3x30"            Prone quad S 3x30"                        TA sets  2x15, 5"          TA marches  2x15          TA heel hovers 2x12           Palloff Press 2x12        Dead bugs with ball 3x12        Isometric crunches 3x12, 3"        palloff press 2x15 Red SC                     Bridges 2x12, 5"  Lime   x      Clamshells 2x15 Lime   x     SL Hip abd 2x15 Lime  x     Prone hip ext 2x10   x              Foam roller 4'   x     Lacrosse ball rollout 3'                 Lunges 15x ea   x     Chair squats 2x10  Foam pad  x     SL leg press 2x10 75#       T-band sidestepping 3L Lime       Step ups 2x10 8"  fwd, lateral x     Other: Hypervolt R gluteal region    Treatment Charges: Mins Units   _0   Modalities     _1   Ther Exercise     _2   Manual Therapy 25 2   _3   Ther Activities     _4   DN 5 NC   _5   Vasocompression     _6   Other/NMR 10 1   Total Treatment time 40 3       Assessment: _7  Progressing toward goals. Improved pain levels and sense of well-being post-treatment. Continued with manual therapy as above with good tolerance. Pt reports overall feeling better with manual treatment.     _8  No change.     _9  Other:    _10  Patient would continue to benefit from skilled physical therapy services in order to: Improve bil LE/LB flexibility, improve core/hip strength, improve tolerance to activity, and help decrease pain to allow pt to return to working out recreationally    Goals  MET NOT  MET ON-  GOING  Details   Date Addressed: NA           STG: To be met in 8 treatments            1. ? Pain: Decrease pain levels to 5/10 with ADLs to help ease all activities and mobility. _11   _12   _13   Still up to 8/10    2. ?  ROM: Increase flexibility in bil LEs/LB grossly to help improve pain-free motion. _0   _1   _2   Still with tightness in posterior glutes    3. Pt will report better ability to sleep, helping to improve quality of sleep. _3   _4   _5   Pt still reporting discomfort with sleeping    4. Independent with Home Exercise Programs _6   _7   _8         _9   _10   _11       Date Addressed: NA           LTG: To be met in 16 treatments           1. Improve score on assessment tool Modified Oswestry from 32% impairment to less than 20% impairment, demonstrating improved tolerance to activity.  _12   _13   _14       2. Reduce pain levels to 2/10 or less with ADLs _15   _16   _17       3. ? Strength: Increase bil hip/core strength to 4+/5 throughout to help improve all core stability and reduce need for compensations with activity. _18   _19   _20       4. Pt will self-report ability to return to working out recreationally with no complaints or pain.                 Patient goals: "Decreased or no pain, full ROM, ability to exercise w/o injury or pain"      Pt. Education:  _21  Yes  _22  No  _23  Reviewed Prior HEP/Ed  Method of Education: _24  Verbal  _25  Demo  _26  Written  Comprehension of Education:  _27  Verbalizes understanding.  _28  Demonstrates understanding.  _29  Needs review.  _30  Demonstrates/verbalizes HEP/Ed previously given.     Plan: _31  Continue current frequency toward long and short term goals.    _32  Specific Instructions for subsequent treatments: Pt would benefit from additional 12 visits at 1-2x/week- SB, PT      Time In: 1220        Time Out: 1300    Electronically signed by:  Rochel Brome, PT

## 2020-10-31 ENCOUNTER — Encounter
Admit: 2020-10-31 | Discharge: 2020-10-31 | Payer: BLUE CROSS/BLUE SHIELD | Attending: Internal Medicine | Primary: Internal Medicine

## 2020-10-31 DIAGNOSIS — Z0000 Encounter for general adult medical examination without abnormal findings: Secondary | ICD-10-CM

## 2020-10-31 MED ORDER — TRAMADOL HCL 50 MG PO TABS
50 MG | ORAL_TABLET | Freq: Four times a day (QID) | ORAL | 0 refills | Status: AC | PRN
Start: 2020-10-31 — End: 2020-11-05

## 2020-10-31 MED ORDER — TIZANIDINE HCL 2 MG PO TABS
2 MG | ORAL_TABLET | Freq: Every day | ORAL | 0 refills | Status: DC | PRN
Start: 2020-10-31 — End: 2021-03-20

## 2020-10-31 NOTE — Progress Notes (Signed)
[facility]  [facility]  [address]  [address]  [place] [address]  Dept: [phone]  Dept Fax: [phone]    Monica Wood is a 45 y.o. female who presents today for her medical conditions/complaints as noted below.    Chief Complaint   Patient presents with   ??? Leg Pain     rt leg since 04/2020, is seeing PT, would like to discuss getting muscle relaxer       HPI:     45 y.o.F presented to office today as a follow-up visit for chronic lower back pain and fibromyalgia's.  Also requested  be well screen.    She was started on Cymbalta during previous visit with which her symptoms have much better.  She continues to have chronic back pain for which she sees physical therapy as well.  Patient mentioned that she feels she has difficulty concentrating and feels she has ADD.  Requested referral to psychiatrist.  She is obese with BMI 38.9. Advised weight loss.    Health maintenance due for Pap smear which patient wants to get done next visit.    Advised lifestyle changes.  Medications reviewed and refilled as appropriate, problem list updated.   All concerns discussed in details and all questions answered to patient satisfaction.            Hemoglobin A1C (%)   Date Value   04/18/2020 5.5             ( goal A1C is < 7)   No results found for: LABMICR  No results found for: LDLCHOLESTEROL, LDLCALC    (goal LDL is <100)   No results found for: AST, ALT, BUN  BP Readings from Last 3 Encounters:   10/31/20 112/64   07/25/20 130/80   04/18/20 124/76          (goal 120/80)    Past Medical History:   Diagnosis Date   ??? Bursitis    ??? Fibromyalgia    ??? Scoliosis    ??? Tendinitis       Past Surgical History:   Procedure Laterality Date   ??? ABDOMINOPLASTY     ??? GALLBLADDER SURGERY     ??? TUBAL LIGATION         Family History   Problem Relation Age of Onset   ??? Diabetes Mother    ??? Diabetes Maternal Grandmother        Social History     Tobacco Use   ??? Smoking status: Never Smoker   ???  Smokeless tobacco: Never Used   Substance Use Topics   ??? Alcohol use: Not Currently      Current Outpatient Medications   Medication Sig Dispense Refill   ??? tiZANidine (ZANAFLEX) 2 MG tablet Take 1 tablet by mouth daily as needed (back muscle) 15 tablet 0   ??? traMADol (ULTRAM) 50 MG tablet Take 1 tablet by mouth every 6 hours as needed for Pain for up to 5 days. Intended supply: 5 days. Take lowest dose possible to manage pain 10 tablet 0   ??? DULoxetine (CYMBALTA) 30 MG extended release capsule take 1 capsule by mouth once daily 30 capsule 2   ??? albuterol sulfate HFA (PROVENTIL HFA) 108 (90 Base) MCG/ACT inhaler Inhale 2 puffs into the lungs every 6 hours as needed for Wheezing or Shortness of Breath 18 g 0     No current facility-administered medications for this visit.  Allergies   Allergen Reactions   ??? Codeine      Anaphylaxis        Health Maintenance   Topic Date Due   ??? COVID-19 Vaccine (1) Never done   ??? HIV screen  Never done   ??? Hepatitis C screen  Never done   ??? DTaP/Tdap/Td vaccine (1 - Tdap) Never done   ??? Cervical cancer screen  Never done   ??? Flu vaccine (Season Ended) 03/14/2021   ??? Depression Monitoring  04/18/2021   ??? Diabetes screen  04/19/2023   ??? Lipids  05/08/2024   ??? Hepatitis A vaccine  Aged Out   ??? Hepatitis B vaccine  Aged Out   ??? Hib vaccine  Aged Out   ??? Meningococcal (ACWY) vaccine  Aged Out   ??? Pneumococcal 0-64 years Vaccine  Aged Out       Subjective:      Review of Systems   Constitutional: Negative for chills, fatigue and fever.   HENT: Negative for congestion, hearing loss and rhinorrhea.    Eyes: Negative for discharge and itching.   Respiratory: Negative for cough, shortness of breath and wheezing.    Cardiovascular: Negative for chest pain, palpitations and leg swelling.   Gastrointestinal: Negative for abdominal distention and abdominal pain.   Endocrine: Negative for polydipsia and polyphagia.   Genitourinary: Negative for difficulty urinating and dysuria.    Musculoskeletal: Positive for back pain. Negative for arthralgias.   Skin: Negative for rash and wound.   Neurological: Negative for dizziness, seizures, light-headedness and headaches.   Psychiatric/Behavioral: Negative for agitation and behavioral problems. The patient is nervous/anxious.        Objective:     Physical Exam  Constitutional:       Appearance: She is well-developed.   HENT:      Head: Normocephalic and atraumatic.   Eyes:      Conjunctiva/sclera: Conjunctivae normal.      Pupils: Pupils are equal, round, and reactive to light.   Neck:      Thyroid: No thyromegaly.      Vascular: No JVD.   Cardiovascular:      Rate and Rhythm: Normal rate and regular rhythm.      Heart sounds: Normal heart sounds. No murmur heard.      Pulmonary:      Effort: Pulmonary effort is normal.      Breath sounds: Normal breath sounds. No wheezing.   Abdominal:      General: Bowel sounds are normal. There is no distension.      Palpations: Abdomen is soft.      Tenderness: There is no abdominal tenderness. There is no rebound.   Musculoskeletal:         General: Tenderness present. Normal range of motion.      Cervical back: Normal range of motion and neck supple.      Comments: Paraspinal tenderness.   Neurological:      Mental Status: She is alert and oriented to person, place, and time.      Cranial Nerves: No cranial nerve deficit.   Psychiatric:         Behavior: Behavior normal.       BP 112/64 (Site: Left Upper Arm, Position: Sitting, Cuff Size: Large Adult)    Pulse 77    Resp 16    Ht 5\' 4"  (1.626 m)    Wt 225 lb (102.1 kg)    SpO2 97%    BMI 38.62  kg/m??     Assessment:        Diagnoses and all orders for this visit:  General medical examination  -     Be Well Health Screen; Future  Vitamin D deficiency  -     Vitamin D 25 Hydroxy; Future  Chronic bilateral low back pain without sciatica  -     tiZANidine (ZANAFLEX) 2 MG tablet; Take 1 tablet by mouth daily as needed (back muscle)  -     traMADol (ULTRAM) 50 MG  tablet; Take 1 tablet by mouth every 6 hours as needed for Pain for up to 5 days. Intended supply: 5 days. Take lowest dose possible to manage pain  Attention deficit hyperactivity disorder (ADHD), other type  -     Enrique Sack, MD, Psychiatry, [facility]  Obesity (BMI 35.0-39.9 without comorbidity)  Lifestyle changes      Plan:      Return in about 6 months (around 05/02/2021).    Orders Placed This Encounter   Procedures   ??? Be Well Health Screen     Patient needs to be fasting at least 8-12 hours.  Labs included in this order panel:    - Glucose  - Lipid Panel (Total Cholesterol, Triglycerides, HDL, LDL Calculated)         Standing Status:   Future     Standing Expiration Date:   10/31/2021   ??? Vitamin D 25 Hydroxy     Standing Status:   Future     Standing Expiration Date:   10/31/2021   ??? Enrique Sack, MD, Psychiatry, [facility]     Referral Priority:   Routine     Referral Type:   Eval and Treat     Referral Reason:   Specialty Services Required     Referred to Provider:   Enrique Sack, MD     Requested Specialty:   Psychiatry     Number of Visits Requested:   1     Orders Placed This Encounter   Medications   ??? tiZANidine (ZANAFLEX) 2 MG tablet     Sig: Take 1 tablet by mouth daily as needed (back muscle)     Dispense:  15 tablet     Refill:  0   ??? traMADol (ULTRAM) 50 MG tablet     Sig: Take 1 tablet by mouth every 6 hours as needed for Pain for up to 5 days. Intended supply: 5 days. Take lowest dose possible to manage pain     Dispense:  10 tablet     Refill:  0     Reduce doses taken as pain becomes manageable         Patient given educational materials - see patient instructions.  Discussed use, benefit, and side effects of prescribedmedications.  All patient questions answered. Pt voiced understanding. Reviewed health maintenance.  Instructed to continue current medications, diet and exercise.  Patient agreed with treatment plan. Follow up as directed.    I spent a total of 30-39 minutes face to face with  this patient.  Over 50% of that time was spent on counseling and care coordination.  Please see assessment and plan for details.      Electronically signed by   Jerlyn Ly, MD on 10/31/2020 at 2:04 PM  [facility].        Please note that this chart was generated using voice recognition Scientist, clinical (histocompatibility and immunogenetics).  Although every effort was made to ensure the accuracy of this  automatedtranscription, some errors in transcription may have occurred.

## 2020-11-06 ENCOUNTER — Inpatient Hospital Stay: Payer: BLUE CROSS/BLUE SHIELD | Primary: Internal Medicine

## 2020-11-06 DIAGNOSIS — E559 Vitamin D deficiency, unspecified: Secondary | ICD-10-CM

## 2020-11-06 LAB — BE WELL HEALTH SCREEN
Chol/HDL Ratio: 3 (ref <5)
Cholesterol: 182 mg/dL (ref <200)
Glucose: 81 mg/dL (ref 70–99)
HDL: 60 mg/dL (ref >40)
LDL Cholesterol: 99 mg/dL (ref 0–130)
Patient Fasting?: 10
Triglycerides: 114 mg/dL (ref <150)

## 2020-11-06 LAB — VITAMIN D 25 HYDROXY: Vit D, 25-Hydroxy: 35 ng/mL (ref >29.9)

## 2020-11-06 NOTE — Other (Signed)
Please notify patient that their lab results are normal.

## 2020-11-19 ENCOUNTER — Inpatient Hospital Stay: Admit: 2020-11-19 | Discharge: 2020-11-19 | Payer: BLUE CROSS/BLUE SHIELD | Primary: Internal Medicine

## 2020-11-19 DIAGNOSIS — M545 Low back pain, unspecified: Secondary | ICD-10-CM

## 2020-11-19 NOTE — Other (Signed)
[]  [place]  [address]  [phone]  F: [phone] []  [place]  [address]   [address]  P: [phone]  F: [phone] [x]  [place]  [place]  P: [phone]  F: [phone] []  [place]  P: [phone]  F: [phone] []  [facility] &  Therapy  [address]   [place]: [phone]  F: [phone]      Physical Therapy Daily Treatment Note    Date:  11/19/2020  Patient Name:  Monica Wood    DOB:  08-01-1975  MRN: [id number]  Physician: Alex Gardener, MD                                                                     Insurance: [employer] (Unlimited Visits)  Medical Diagnosis: Low back pain, unspecified back pain bilaterality, unspecified chronicity, unspecified whether sciatica present, Fibromyalgia                    Rehab Codes: M54.5, M79.7  Onset Date: 03/15/20                                                                                         Next Dr.'s appt.: 05/24/20  Visit# / total visits: 22/28       Cancels/No Shows: 1/0    Subjective:  Patient reports overall she is feeling better. Pain levels are better.Was down [place] and felt better in the heat.  Feeling like she is getting stronger overall also.   Pain:  [x]  Yes  []  No  Location: R posterior hip down leg   Pain Rating: (0-10 scale) 2-3/10   Pain altered Tx:  []  No  [x]  Yes  Action: See below    Objective:   Modalities: Manual therapy 45 min: ART/MFR to R hip ER's, piriformis, glut med and HS, LL distraction, sacral traction with L5-S1 distraction along with pelvic diaphragm release, respiratory diaphragm release, hyoid release, OCB, dural traction, CSR/SER with VI & D and added HCT, No Hypervolt to R hip/L-S  spine. Continued with 5 DN to B hip ERs, glut meds, L-S spine    NMR 10 min: Continued NIS w/DN  to R hip 10 min     CP R gluteal region- Held today  Precautions: General  Exercises: Rev only  Exercise  LB Reps/ Time Weight/ Level Comments      Nustep 8'     x                  HS step  S 3x30"     x     SB S 3x30"      x     Hip flexor step S 3x30"     x     Supine piriformis S 3x30     x     Figure 4 piriformis S 3x30"     x     Prayer S 3x30"           Keeling hip flexor S 3x30"            Prone quad S 3x30"                        TA sets  2x15, 5"          TA marches  2x15          TA heel hovers 2x12           Palloff Press 2x12        Dead bugs with ball 3x12        Isometric crunches 3x12, 3"        palloff press 2x15 Red SC                     Bridges 2x12, 5"  Lime   x      Clamshells 2x15 Lime   x     SL Hip abd 2x15 Lime  x     Prone hip ext 2x10   x              Foam roller 4'   x     Lacrosse ball rollout 3'                 Lunges 15x ea   x     Chair squats 2x10  Foam pad  x     SL leg press 2x10 75#       T-band sidestepping 3L Lime       Step ups 2x10 8"  fwd, lateral x     Other: Hypervolt R gluteal region    Treatment Charges: Mins Units   []   Modalities     []   Ther Exercise     [x]   Manual Therapy 50 3   []   Ther Activities     [x]   DN 5 NC   []   Vasocompression     [x]   Other/NMR 10 1   Total Treatment time 60 4       Assessment: [x]  Progressing toward goals. Improved pain levels and sense of well-being post-treatment. Significant SER this session. Continued with manual therapy as above with good tolerance. Pt reports overall feeling better with manual treatment.     []  No change.     []  Other:    [x]  Patient would continue to benefit from skilled physical therapy services in order to: Improve bil LE/LB flexibility, improve core/hip strength, improve tolerance to activity, and help decrease pain to allow pt to return to working out recreationally    Goals  MET NOT MET ON-  GOING  Details   Date  Addressed: NA           STG: To be met in 8 treatments            1. ? Pain: Decrease pain levels to 5/10 with ADLs to help ease all activities and mobility. []   []   [x]   Still up to 8/10  2. ? ROM: Increase flexibility in bil LEs/LB grossly to help improve pain-free motion. []   []   [x]   Still with tightness in posterior glutes    3. Pt will report better ability to sleep, helping to improve quality of sleep. []   []   [x]   Pt still reporting discomfort with sleeping    4. Independent with Home Exercise Programs [x]   []   []         []   []   []       Date Addressed: NA           LTG: To be met in 16 treatments           1. Improve score on assessment tool Modified Oswestry from 32% impairment to less than 20% impairment, demonstrating improved tolerance to activity.  []   []   []       2. Reduce pain levels to 2/10 or less with ADLs []   []   []       3. ? Strength: Increase bil hip/core strength to 4+/5 throughout to help improve all core stability and reduce need for compensations with activity. []   []   []       4. Pt will self-report ability to return to working out recreationally with no complaints or pain.                 Patient goals: "Decreased or no pain, full ROM, ability to exercise w/o injury or pain"      Pt. Education:  [x]  Yes  []  No  [x]  Reviewed Prior HEP/Ed  Method of Education: [x]  Verbal  [x]  Demo  []  Written  Comprehension of Education:  [x]  Verbalizes understanding.  [x]  Demonstrates understanding.  [x]  Needs review.  []  Demonstrates/verbalizes HEP/Ed previously given.     Plan: []  Continue current frequency toward long and short term goals.    [x]  Specific Instructions for subsequent treatments: Pt would benefit from additional 12 visits at 1-2x/week- SB, PT      Time In: 1105        Time Out: 1205    Electronically signed by:  Rochel Brome, PT

## 2020-12-21 ENCOUNTER — Telehealth

## 2020-12-21 NOTE — Telephone Encounter (Signed)
The patient called asking for her PCP to order [facility] labs to be completed, she would like a call back when the labs are ordered.    Please advise.

## 2020-12-24 NOTE — Telephone Encounter (Signed)
Ordered labs  As requested

## 2020-12-24 NOTE — Telephone Encounter (Signed)
Kindly elaborate.  What does patient need these labs for.  Is it for her early menopause.  Cannot elaborate the symptoms.  Thank you

## 2020-12-24 NOTE — Addendum Note (Signed)
Addended by: Jerlyn Ly on: 12/24/2020 12:59 PM     Modules accepted: Orders

## 2020-12-24 NOTE — Telephone Encounter (Signed)
Left voicemail notifying patient and to call office with any questions

## 2020-12-24 NOTE — Telephone Encounter (Signed)
Called and spoke to patient and she is requesting orders for her symptoms of early menopause.

## 2020-12-26 ENCOUNTER — Inpatient Hospital Stay: Payer: BLUE CROSS/BLUE SHIELD | Primary: Internal Medicine

## 2020-12-26 DIAGNOSIS — E28319 Asymptomatic premature menopause: Secondary | ICD-10-CM

## 2020-12-26 LAB — PROLACTIN: Prolactin: 23.87 ng/mL — ABNORMAL HIGH (ref 4.79–23.30)

## 2020-12-26 LAB — FOLLICLE STIMULATING HORMONE: FSH: 11.6 m[IU]/mL (ref 1.7–21.5)

## 2020-12-26 LAB — TSH WITH REFLEX: TSH: 1.12 u[IU]/mL (ref 0.30–5.00)

## 2020-12-26 LAB — LUTEINIZING HORMONE: LH: 13.9 m[IU]/mL (ref 1.0–95.6)

## 2020-12-27 ENCOUNTER — Ambulatory Visit: Admit: 2020-12-27 | Discharge: 2020-12-27 | Payer: BLUE CROSS/BLUE SHIELD | Primary: Internal Medicine

## 2020-12-27 ENCOUNTER — Ambulatory Visit
Admit: 2020-12-27 | Discharge: 2020-12-27 | Payer: BLUE CROSS/BLUE SHIELD | Attending: Physician Assistant | Primary: Internal Medicine

## 2020-12-27 DIAGNOSIS — M7062 Trochanteric bursitis, left hip: Secondary | ICD-10-CM

## 2020-12-27 MED ORDER — LIDOCAINE HCL 1 % IJ SOLN
1 % | Freq: Once | INTRAMUSCULAR | Status: AC
Start: 2020-12-27 — End: 2020-12-28
  Administered 2020-12-28: 18:00:00 2 mL via INTRA_ARTICULAR

## 2020-12-27 MED ORDER — BETAMETHASONE SOD PHOS & ACET 6 (3-3) MG/ML IJ SUSP
6 (3-3) MG/ML | Freq: Once | INTRAMUSCULAR | Status: AC
Start: 2020-12-27 — End: 2020-12-28
  Administered 2020-12-28: 18:00:00 12 mg via INTRA_ARTICULAR

## 2020-12-27 MED ORDER — BUPIVACAINE HCL 0.5 % IJ SOLN
0.5 % | Freq: Once | INTRAMUSCULAR | Status: AC
Start: 2020-12-27 — End: 2020-12-28
  Administered 2020-12-28: 18:00:00 10 mL via INTRA_ARTICULAR

## 2020-12-27 NOTE — Addendum Note (Signed)
Addended by: Precious Reel on: 12/27/2020 02:24 PM     Modules accepted: Orders

## 2020-12-27 NOTE — Progress Notes (Signed)
[facility]                Monica Wood, [place]            [address], [address]               [place], [place] [address]           Dept Phone: [phone]           Dept Fax:  [phone]              [address]                       [address]           [place], [place] [address]          Dept Phone: [phone]           Dept Fax:  [phone]      Chief Compliant:  Chief Complaint   Patient presents with   ??? Pain     bilateral hips        History of Present Illness:  This is a 45 y.o. female who presents to the clinic today for evaluation of had concerns including Pain (bilateral hips).     Monica Wood is a 45 year old female who presents for evaluation of bilateral lateral hip pain.  She has seen Dr. Dion Body in the past for both her low back and her bilateral hip pain.  She has been found to have trochanteric bursitis and has done well with corticosteroid injections.    Patient reports that the last injections on 05/24/2020 actually provided fair amount of relief of pain for nearly 8 months.  Patient is requesting repeat injections today.    Localizes pain mostly to the lateral aspect of both hips.  She reports that her low back pain  is pretty well controlled at present.        Past History:    Current Outpatient Medications:   ???  tiZANidine (ZANAFLEX) 2 MG tablet, Take 1 tablet by mouth daily as needed (back muscle), Disp: 15 tablet, Rfl: 0  ???  DULoxetine (CYMBALTA) 30 MG extended release capsule, take 1 capsule by mouth once daily, Disp: 30 capsule, Rfl: 2  ???  albuterol sulfate HFA (PROVENTIL HFA) 108 (90 Base) MCG/ACT inhaler, Inhale 2 puffs into the lungs every 6 hours as needed for Wheezing or Shortness of Breath, Disp: 18 g, Rfl: 0  Allergies   Allergen Reactions   ??? Codeine      Anaphylaxis      Social History     Socioeconomic History   ??? Marital status: Married     Spouse name: Not on file   ??? Number of children: Not on file   ??? Years of education: Not on  file   ??? Highest education level: Not on file   Occupational History   ??? Not on file   Tobacco Use   ??? Smoking status: Never Smoker   ??? Smokeless tobacco: Never Used   Substance and Sexual Activity   ??? Alcohol use: Not Currently   ??? Drug use: Not on file   ??? Sexual activity: Not on file   Other Topics Concern   ??? Not on file   Social History Narrative   ??? Not on file     Social Determinants of Health  Financial Resource Strain: Low Risk    ??? Difficulty of Paying Living Expenses: Not hard at all   Food Insecurity: No Food Insecurity   ??? Worried About Programme researcher, broadcasting/film/video in the Last Year: Never true   ??? Ran Out of Food in the Last Year: Never true   Transportation Needs:    ??? Lack of Transportation (Medical): Not on file   ??? Lack of Transportation (Non-Medical): Not on file   Physical Activity:    ??? Days of Exercise per Week: Not on file   ??? Minutes of Exercise per Session: Not on file   Stress:    ??? Feeling of Stress : Not on file   Social Connections:    ??? Frequency of Communication with Friends and Family: Not on file   ??? Frequency of Social Gatherings with Friends and Family: Not on file   ??? Attends Religious Services: Not on file   ??? Active Member of Clubs or Organizations: Not on file   ??? Attends Banker Meetings: Not on file   ??? Marital Status: Not on file   Intimate Partner Violence:    ??? Fear of Current or Ex-Partner: Not on file   ??? Emotionally Abused: Not on file   ??? Physically Abused: Not on file   ??? Sexually Abused: Not on file   Housing Stability:    ??? Unable to Pay for Housing in the Last Year: Not on file   ??? Number of Places Lived in the Last Year: Not on file   ??? Unstable Housing in the Last Year: Not on file     Past Medical History:   Diagnosis Date   ??? Bursitis    ??? Fibromyalgia    ??? Scoliosis    ??? Tendinitis      Past Surgical History:   Procedure Laterality Date   ??? ABDOMINOPLASTY     ??? GALLBLADDER SURGERY     ??? TUBAL LIGATION       Family History   Problem Relation Age of Onset    ??? Diabetes Mother    ??? Diabetes Maternal Grandmother         Review of Systems   Constitutional: Negative for fever, chills, sweats.   Eyes: Negative for changes in vision, or pain.   HENT: Negative for ear ache, epistaxis, or sore throat.  Respiratory/Cardio: Negative for Chest pain, palpitations, SOB, or cough.  Gastrointestinal: Negative for abdominal pain, N/V/D.   Genitourinary: Negative for dysuria, frequency, urgency, or hematuria.   Neurological: Negative for headache, numbness, or weakness.   Integumentary: Negative for rash, itching, laceration, or abrasion.   Musculoskeletal: Positive for Pain (bilateral hips)       Physical Exam:  Constitutional: Patient is oriented to person, place, and time. Patient appears well-developed and well nourished.   HENT: Negative otherwise noted  Head: Normocephalic and Atraumatic  Nose: Normal  Eyes: Conjunctivae and EOM are normal  Neck: Normal range of motion Neck supple.    Respiratory/Cardio: Effort normal. No respiratory distress.  Musculoskeletal:    bilateral Hip    Tenderness: Severe tenderness over the bilateral greater trochanter.  Mild tenderness over the proximal IT band.  No tenderness to the anterior posterior hip       Range of Motion:      Extension: 10     Flexion: 120     Internal Rotation: 30     External Rotation: 40     Abduction: 40  Adduction:  30       Muscle Strength      Abduction: 5/5     Adduction: 5/5     Flexion: 5/5         Gait: Antalgic   Faber Test: Negative   Ober Test: Positive       Neurological: Patient is alert and oriented to person, place, and time. Normal strenght. No sensory deficit.  Skin: Skin is warm and dry  Psychiatric: Behavior is normal. Thought content normal.  Nursing note and vitals reviewed.     Labs and Imaging:           X-rays taken in clinic today and preliminarily reviewed by me 12/27/20:  AP pelvis demonstrates normal anatomical bilateral hips.  Joint spaces overall maintained with minimal degenerative changes  of the left hip.  No evidence of acute fracture.  No interval changes and degenerative changes since September 2021 x-rays of lumbar spine.       Orders Placed This Encounter   Procedures   ??? XR PELVIS (1-2 VIEWS)     Standing Status:   Future     Number of Occurrences:   1     Standing Expiration Date:   12/27/2021       Assessment and Plan:  1. Trochanteric bursitis of both hips          PLAN:  Monica Wood is a 45 y.o. old female with bilateral hip pain that is consistent with bilateral trochanteric bursitis.  We discussed treatment options available to her, including nonoperative and operative intervention.  At this time I believe she will benefit from conservative management.  Consequently, an extensive home exercise program was provided.  We also discussed use the possibility of a cortisone injection and at this time she has elected to proceed with as described below    I'll see her back my clinic in 4 months for reevaluation but she was encouraged to return or call earlier with questions and/or concerns.    Procedure: biateral trochanteric bursal celestone injection  An informed verbal consent for the procedure was obtained and risks including, but not limited to: allergy to medications, injection, bleeding, stiffness of joint, recurrence of symptoms, loss of function, swelling, drainage, irrigation, need for surgery and pseudo-septic inflammation, were explained to the patient. Also, discussed was the potential for further injections, irrigation and debridement and surgery. Alternate means of treatment have also been discussed with the patient.          Following an appropriate discussion with the patient regarding the risks and benefits of the procedure she consented to proceed. her bilateral trochanteric bursa was prepped using betadine solution. Using aseptic technique and through a lateral approach, her bilateral trochanteric bursa was injected with a 6 cc mixture of 2 cc of 1% lidocaine without  epinephrine, 2 cc marcaine and 2 cc of 6 mg/mL Celestone into the bursal space.A band aid was applied to the injection site. she tolerated the injection with no immediate adverse reactions.            Electronically signed by Monica Coffin, PA on 12/27/20 at 2:06 PM EDT        Please note that this chart was generated using voice recognition Dragon dictation software.  Although every effort was made to ensure the accuracy of this automated transcription, some errors in transcription may have occurred.

## 2021-01-20 ENCOUNTER — Inpatient Hospital Stay
Admit: 2021-01-20 | Discharge: 2021-01-20 | Disposition: A | Discharge status: Home / Self Care | Payer: BLUE CROSS/BLUE SHIELD | Attending: Emergency Medicine

## 2021-01-20 ENCOUNTER — Emergency Department: Admit: 2021-01-20 | Payer: BLUE CROSS/BLUE SHIELD | Primary: Internal Medicine

## 2021-01-20 DIAGNOSIS — R0789 Other chest pain: Secondary | ICD-10-CM

## 2021-01-20 LAB — CBC
Hematocrit: 34.7 % — ABNORMAL LOW (ref 36–46)
Hemoglobin: 11 g/dL — ABNORMAL LOW (ref 12.0–16.0)
MCH: 23.1 pg — ABNORMAL LOW (ref 26–34)
MCHC: 31.7 g/dL (ref 31–37)
MCV: 72.8 fL — ABNORMAL LOW (ref 80–100)
MPV: 7.4 fL (ref 6.0–12.0)
Platelets: 292 10*3/uL (ref 140–450)
RBC: 4.76 m/uL (ref 4.0–5.2)
RDW: 15.7 % — ABNORMAL HIGH (ref 12.5–15.4)
WBC: 5.5 10*3/uL (ref 3.5–11.0)

## 2021-01-20 LAB — COMPREHENSIVE METABOLIC PANEL
ALT: 34 U/L — ABNORMAL HIGH (ref 5–33)
AST: 22 U/L (ref <32)
Albumin/Globulin Ratio: 1.6 (ref 1.0–2.5)
Albumin: 4.1 g/dL (ref 3.5–5.2)
Alkaline Phosphatase: 133 U/L — ABNORMAL HIGH (ref 35–104)
Anion Gap: 8 mmol/L — ABNORMAL LOW (ref 9–17)
BUN: 8 mg/dL (ref 6–20)
CO2: 26 mmol/L (ref 20–31)
Calcium: 10 mg/dL (ref 8.6–10.4)
Chloride: 105 mmol/L (ref 98–107)
Creatinine: 0.73 mg/dL (ref 0.50–0.90)
GFR African American: >60 mL/min (ref >60)
GFR Non-African American: >60 mL/min (ref >60)
Glucose: 100 mg/dL — ABNORMAL HIGH (ref 70–99)
Potassium: 4.3 mmol/L (ref 3.7–5.3)
Sodium: 139 mmol/L (ref 135–144)
Total Bilirubin: 0.14 mg/dL — ABNORMAL LOW (ref 0.3–1.2)
Total Protein: 6.7 g/dL (ref 6.4–8.3)

## 2021-01-20 LAB — D-DIMER, QUANTITATIVE: D-Dimer, Quant: 0.57 mg/L FEU

## 2021-01-20 LAB — TROPONIN
Troponin, High Sensitivity: <6 ng/L (ref 0–14)
Troponin, High Sensitivity: <6 ng/L (ref 0–14)

## 2021-01-20 LAB — PREGNANCY, URINE: HCG(Urine) Pregnancy Test: NEGATIVE

## 2021-01-20 MED ORDER — SODIUM CHLORIDE 0.9 % IV BOLUS
0.9 % | Freq: Once | INTRAVENOUS | Status: AC
Start: 2021-01-20 — End: 2021-01-20
  Administered 2021-01-20: 10:00:00 80 mL via INTRAVENOUS

## 2021-01-20 MED ORDER — METHYLPREDNISOLONE SODIUM SUCC 125 MG IJ SOLR
125 MG | Freq: Once | INTRAMUSCULAR | Status: AC
Start: 2021-01-20 — End: 2021-01-20
  Administered 2021-01-20: 11:00:00 125 mg via INTRAVENOUS

## 2021-01-20 MED ORDER — DIPHENHYDRAMINE HCL 50 MG/ML IJ SOLN
50 MG/ML | Freq: Once | INTRAMUSCULAR | Status: AC
Start: 2021-01-20 — End: 2021-01-20
  Administered 2021-01-20: 11:00:00 25 mg via INTRAVENOUS

## 2021-01-20 MED ORDER — NORMAL SALINE FLUSH 0.9 % IV SOLN
0.9 % | Freq: Three times a day (TID) | INTRAVENOUS | Status: DC
Start: 2021-01-20 — End: 2021-01-20

## 2021-01-20 MED ORDER — MORPHINE SULFATE 4 MG/ML IJ SOLN
4 MG/ML | Freq: Once | INTRAMUSCULAR | Status: AC
Start: 2021-01-20 — End: 2021-01-20
  Administered 2021-01-20: 11:00:00 2 mg via INTRAVENOUS

## 2021-01-20 MED ORDER — IOPAMIDOL 76 % IV SOLN
76 % | Freq: Once | INTRAVENOUS | Status: AC | PRN
Start: 2021-01-20 — End: 2021-01-20
  Administered 2021-01-20: 10:00:00 75 mL via INTRAVENOUS

## 2021-01-20 MED ORDER — NORMAL SALINE FLUSH 0.9 % IV SOLN
0.9 % | INTRAVENOUS | Status: DC | PRN
Start: 2021-01-20 — End: 2021-01-20
  Administered 2021-01-20: 10:00:00 10 mL via INTRAVENOUS

## 2021-01-20 MED ORDER — ASPIRIN 81 MG PO CHEW
81 MG | Freq: Once | ORAL | Status: AC
Start: 2021-01-20 — End: 2021-01-20
  Administered 2021-01-20: 09:00:00 324 mg via ORAL

## 2021-01-20 MED FILL — MORPHINE SULFATE 4 MG/ML IJ SOLN: 4 mg/mL | INTRAMUSCULAR | Qty: 1

## 2021-01-20 MED FILL — ASPIRIN 81 MG PO CHEW: 81 mg | ORAL | Qty: 4

## 2021-01-20 MED FILL — DIPHENHYDRAMINE HCL 50 MG/ML IJ SOLN: 50 mg/mL | INTRAMUSCULAR | Qty: 1

## 2021-01-20 MED FILL — SOLU-MEDROL 125 MG IJ SOLR: 125 mg | INTRAMUSCULAR | Qty: 125

## 2021-01-20 NOTE — ED Provider Notes (Signed)
[facility] ED  EMERGENCY DEPARTMENT ENCOUNTER      Pt Name: Monica Wood  MRN: [id number]  Birthdate January 08, 1976  Date of evaluation: 01/20/2021  Provider: Job Founds, MD    CHIEF COMPLAINT       Chief Complaint   Patient presents with   ??? Chest Pain     pt presents with left sided chest pain x2 days , pain is radiating down left arm and also radiates to jaw.        HISTORY OF PRESENT ILLNESS  (Location/Symptom, Timing/Onset, Context/Setting, Quality, Duration, Modifying Factors, Severity.)   Monica Wood is a 45 y.o. female who presents to the emergency department complaining of left-sided chest pain that is been ongoing for 2 days.  However within the last several hours is now radiating down the left arm and to the jaw.  She relates she is never really had anything like this before.  She does feel little bit short of breath with it.  No dizziness or lightheadedness.  No nausea or vomiting.  She relates nothing really makes it better or worse.  Just got worse this morning.    Nursing Notes were reviewed.    REVIEW OF SYSTEMS    (2-9 systems for level 4, 10 or more for level 5)     Review of Systems   Constitutional: Negative for activity change, appetite change, chills, fatigue and fever.   HENT: Negative for congestion, ear pain and sore throat.    Eyes: Negative for pain, discharge and redness.   Respiratory: Positive for shortness of breath. Negative for cough, wheezing and stridor.    Cardiovascular: Positive for chest pain.   Gastrointestinal: Negative for abdominal pain, constipation, diarrhea, nausea and vomiting.   Genitourinary: Negative for decreased urine volume and difficulty urinating.   Musculoskeletal: Negative for arthralgias and myalgias.   Skin: Negative for color change and rash.   Neurological: Negative for dizziness, weakness and headaches.   Psychiatric/Behavioral: Negative for behavioral problems and confusion.       Except as noted above the remainder of the review of  systems was reviewed and negative.       PAST MEDICAL HISTORY     Past Medical History:   Diagnosis Date   ??? Bursitis    ??? Fibromyalgia    ??? Scoliosis    ??? Tendinitis        SURGICAL HISTORY       Past Surgical History:   Procedure Laterality Date   ??? ABDOMINOPLASTY     ??? ESOPHAGOGASTRIC FUNDOPLICATION  2000   ??? GALLBLADDER SURGERY     ??? TUBAL LIGATION         CURRENT MEDICATIONS       Previous Medications    ALBUTEROL SULFATE HFA (PROVENTIL HFA) 108 (90 BASE) MCG/ACT INHALER    Inhale 2 puffs into the lungs every 6 hours as needed for Wheezing or Shortness of Breath    BUPROPION (WELLBUTRIN XL) 150 MG EXTENDED RELEASE TABLET    Take 150 mg by mouth every morning    DULOXETINE (CYMBALTA) 30 MG EXTENDED RELEASE CAPSULE    take 1 capsule by mouth once daily    TIZANIDINE (ZANAFLEX) 2 MG TABLET    Take 1 tablet by mouth daily as needed (back muscle)    TRAZODONE HCL PO    Take by mouth       ALLERGIES     Codeine and Morphine    FAMILY HISTORY  Problem Relation Age of Onset   ??? Diabetes Mother    ??? Diabetes Maternal Grandmother      Family Status   Relation Name Status   ??? Mother  (Not Specified)   ??? MGM  (Not Specified)        SOCIAL HISTORY      reports that she has never smoked. She has never used smokeless tobacco. She reports current alcohol use. She reports that she does not use drugs.    PHYSICAL EXAM    (up to 7 for level 4, 8 or more for level 5)     Physical Exam  Vitals and nursing note reviewed.   Constitutional:       General: She is not in acute distress.     Appearance: She is well-developed. She is not ill-appearing, toxic-appearing or diaphoretic.   HENT:      Head: Normocephalic and atraumatic.      Right Ear: External ear normal.      Left Ear: External ear normal.      Nose: Nose normal.      Mouth/Throat:      Mouth: Mucous membranes are moist.   Eyes:      General:         Right eye: No discharge.         Left eye: No discharge.      Conjunctiva/sclera: Conjunctivae normal.      Pupils:  Pupils are equal, round, and reactive to light.   Cardiovascular:      Rate and Rhythm: Normal rate and regular rhythm.      Heart sounds: Normal heart sounds. No murmur heard.      Pulmonary:      Effort: Pulmonary effort is normal. No respiratory distress.      Breath sounds: Normal breath sounds. No wheezing, rhonchi or rales.   Chest:      Chest wall: No tenderness.   Abdominal:      General: Bowel sounds are normal. There is no distension.      Palpations: Abdomen is soft. There is no mass.      Tenderness: There is no abdominal tenderness. There is no guarding or rebound.   Musculoskeletal:         General: Normal range of motion.      Cervical back: Normal range of motion and neck supple.      Right lower leg: No edema.      Left lower leg: No edema.   Skin:     General: Skin is warm.      Findings: No rash.   Neurological:      General: No focal deficit present.      Mental Status: She is alert and oriented to person, place, and time.      Motor: No abnormal muscle tone.   Psychiatric:         Mood and Affect: Mood normal.         Behavior: Behavior normal.         DIAGNOSTIC RESULTS     EKG: All EKG's are interpreted by the Emergency Department Physician who either signs or Co-signs this chart in the absence of a cardiologist.    EKG Interpretation    Interpreted by me    Rhythm: normal sinus   Rate: normal  Axis: normal  Ectopy: none  Conduction: normal  ST Segments: no acute change  T Waves: no acute change  Q Waves: none  Clinical Impression: no acute changes and normal EKG    RADIOLOGY:   Non-plain film images such as CT, Ultrasound and MRI are read by the radiologist.     Interpretation per the Radiologist below, if available at the time of this note:    CT CHEST PULMONARY EMBOLISM W CONTRAST   Final Result   No evidence of pulmonary embolism or acute pulmonary abnormality.               ED BEDSIDE ULTRASOUND:   Performed by ED Physician - none    LABS:  Labs Reviewed   CBC - Abnormal; Notable for  the following components:       Result Value    Hemoglobin 11.0 (*)     Hematocrit 34.7 (*)     MCV 72.8 (*)     MCH 23.1 (*)     RDW 15.7 (*)     All other components within normal limits   COMPREHENSIVE METABOLIC PANEL - Abnormal; Notable for the following components:    Glucose 100 (*)     Anion Gap 8 (*)     Alkaline Phosphatase 133 (*)     ALT 34 (*)     Total Bilirubin 0.14 (*)     All other components within normal limits   TROPONIN   TROPONIN   D-DIMER, QUANTITATIVE   PREGNANCY, URINE       All other labs were within normal range or not returned as of this dictation.    EMERGENCY DEPARTMENT COURSE and DIFFERENTIAL DIAGNOSIS/MDM:   Vitals:    Vitals:    01/20/21 0600 01/20/21 0611 01/20/21 0630 01/20/21 0645   BP: 116/69 113/66 (!) 114/55    Pulse: 62 58 60 59   Resp: 15 12 15 12    SpO2: 100% 100% 99%    Weight:       Height:           We did discuss EKG, lab work and imaging to rule out any suggestion of MI, infectious etiology or blood clot in the lungs.  Thoracic etiology would be much less likely in my mind at this time.    Patient feels like she can't breath several minutes after getting morphine.  So we have given benadryl and solu medrol for allergic reaction. Currently awaiting 2nd troponin.    CONSULTS:  None    PROCEDURES:  None    FINAL IMPRESSION      1. Chest pain, unspecified type          DISPOSITION/PLAN   DISPOSITION  home      PATIENT REFERRED TO:  [place] , MD  [address]  [address]  [place]  [phone]    Call in 2 days        DISCHARGE MEDICATIONS:  New Prescriptions    No medications on file       (Please note that portions of this note were completed with a voice recognition program.  Efforts were made to edit the dictations but occasionally words are mis-transcribed.)    [phone], MD  Attending Emergency Physician        Job Founds, MD  01/20/21 0700

## 2021-01-20 NOTE — ED Notes (Signed)
Rn inquires for full dose of morhpine with noted bp and heart rate. Dr. Erich Montane orders half dose for toleration.      Iline Oven, RN  01/20/21 [phone]

## 2021-01-20 NOTE — ED Notes (Signed)
Rn to bedside for re-evaluation for morhpine , pt states she feels as if her "chest is tight" and is "having trouble breathing". Dr Erich Montane immediately notified . Benedryl and Solumedrol ordered. Medication administered. Dr. Erich Montane at bedside . Rn remains at bedside SPO2 remain 99-100.       Iline Oven, RN  01/20/21 [phone]

## 2021-01-21 LAB — EKG 12-LEAD
Atrial Rate: 75 {beats}/min
P Axis: 50 degrees
P-R Interval: 164 ms
Q-T Interval: 398 ms
QRS Duration: 96 ms
QTc Calculation (Bazett): 444 ms
R Axis: 33 degrees
T Axis: 37 degrees
Ventricular Rate: 75 {beats}/min

## 2021-03-11 ENCOUNTER — Encounter

## 2021-03-20 ENCOUNTER — Encounter

## 2021-03-22 MED ORDER — TIZANIDINE HCL 2 MG PO TABS
2 | ORAL_TABLET | Freq: Every day | ORAL | 1 refills | 15.00000 days | Status: DC | PRN
Start: 2021-03-22 — End: 2024-07-02

## 2021-03-23 ENCOUNTER — Inpatient Hospital Stay: Admit: 2021-03-23 | Payer: BLUE CROSS/BLUE SHIELD | Primary: Internal Medicine

## 2021-03-23 DIAGNOSIS — H93293 Other abnormal auditory perceptions, bilateral: Secondary | ICD-10-CM

## 2021-03-23 MED ORDER — GADOTERIDOL 279.3 MG/ML IV SOLN
279.3 MG/ML | Freq: Once | INTRAVENOUS | Status: AC | PRN
Start: 2021-03-23 — End: 2021-03-23
  Administered 2021-03-23: 12:00:00 20 mL via INTRAVENOUS

## 2021-05-01 ENCOUNTER — Encounter: Attending: Internal Medicine | Primary: Internal Medicine

## 2021-05-31 ENCOUNTER — Inpatient Hospital Stay
Admit: 2021-05-31 | Discharge: 2021-05-31 | Disposition: A | Discharge status: Home / Self Care | Payer: PRIVATE HEALTH INSURANCE | Attending: Emergency Medicine

## 2021-05-31 ENCOUNTER — Emergency Department: Admit: 2021-05-31 | Payer: PRIVATE HEALTH INSURANCE | Primary: Internal Medicine

## 2021-05-31 DIAGNOSIS — S0990XA Unspecified injury of head, initial encounter: Secondary | ICD-10-CM

## 2021-05-31 MED ORDER — KETOROLAC TROMETHAMINE 30 MG/ML IJ SOLN
30 MG/ML | Freq: Once | INTRAMUSCULAR | Status: AC
Start: 2021-05-31 — End: 2021-05-31
  Administered 2021-05-31: 16:00:00 30 mg via INTRAMUSCULAR

## 2021-05-31 MED FILL — KETOROLAC TROMETHAMINE 30 MG/ML IJ SOLN: 30 MG/ML | INTRAMUSCULAR | Qty: 1
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
## 2021-05-31 NOTE — Discharge Instructions (Signed)
Please understand that at this time there is no evidence for a more serious underlying process, but that early in the process of an illness or injury, an emergency department workup can be falsely reassuring.  You should contact your family doctor within the next 48 hours for a follow up appointment    THANK YOU!!!    From [facility]    On behalf of the Emergency Department staff at [facility], I would like to thank you for giving us the opportunity to address your health care needs and concerns.    We hope that during your visit, our service was delivered in a professional and caring manner. Please keep  Health in mind as we walk with you down the path to your own personal wellness.     Please expect an automated text message or email from us so we can ask a few questions about your health and progress. Based on your answers, a clinician may call you back to offer help and instructions.    Please understand that early in the process of an illness or injury, an emergency department workup can be falsely reassuring.  If you notice any worsening, changing or persistent symptoms please call your family doctor or return to the ER immediately.     Tell us how we did during your visit at [email or web address]   and let us know about your experience

## 2021-06-10 ENCOUNTER — Encounter: Payer: BLUE CROSS/BLUE SHIELD | Primary: Internal Medicine

## 2021-06-12 ENCOUNTER — Ambulatory Visit
Admit: 2021-06-12 | Discharge: 2021-06-12 | Payer: BLUE CROSS/BLUE SHIELD | Attending: Registered Nurse | Primary: Internal Medicine

## 2021-06-12 DIAGNOSIS — S161XXA Strain of muscle, fascia and tendon at neck level, initial encounter: Secondary | ICD-10-CM

## 2021-06-12 MED ORDER — CYCLOBENZAPRINE HCL 5 MG PO TABS
5 MG | ORAL_TABLET | Freq: Three times a day (TID) | ORAL | 0 refills | Status: AC | PRN
Start: 2021-06-12 — End: 2021-06-17

## 2021-06-12 MED ORDER — METHYLPREDNISOLONE 4 MG PO TBPK
4 MG | PACK | ORAL | 0 refills | Status: AC
Start: 2021-06-12 — End: 2021-06-18
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
## 2021-06-20 ENCOUNTER — Ambulatory Visit
Admit: 2021-06-20 | Discharge: 2021-06-20 | Payer: PRIVATE HEALTH INSURANCE | Attending: Physician Assistant | Primary: Internal Medicine

## 2021-06-20 DIAGNOSIS — M7062 Trochanteric bursitis, left hip: Secondary | ICD-10-CM

## 2021-06-20 DIAGNOSIS — M7061 Trochanteric bursitis, right hip: Secondary | ICD-10-CM

## 2021-06-20 MED ORDER — BUPIVACAINE HCL 0.5 % IJ SOLN
0.5 % | Freq: Once | INTRAMUSCULAR | Status: AC
Start: 2021-06-20 — End: 2021-06-20
  Administered 2021-06-20: 19:00:00 10 mL via INTRA_ARTICULAR

## 2021-06-20 MED ORDER — TRIAMCINOLONE ACETONIDE 40 MG/ML IJ SUSP
40 MG/ML | Freq: Once | INTRAMUSCULAR | Status: AC
Start: 2021-06-20 — End: 2021-06-20
  Administered 2021-06-20: 20:00:00 40 mg via INTRA_ARTICULAR

## 2021-06-20 MED ORDER — LIDOCAINE HCL 1 % IJ SOLN
1 % | Freq: Once | INTRAMUSCULAR | Status: AC
Start: 2021-06-20 — End: 2021-06-20
  Administered 2021-06-20: 20:00:00 2 mL via INTRA_ARTICULAR
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
## 2021-07-01 NOTE — Telephone Encounter (Signed)
The patient was seen at [facility] and given a muscle relaxer and steroid to help with her pain.  She states she was advise to contact the walk in clinic back if her symptoms had not improved to get a referral to Physical Therapy.    The patient would like to have a referral to [facility].

## 2021-07-02 ENCOUNTER — Encounter

## 2021-07-02 NOTE — Telephone Encounter (Signed)
PT order placed

## 2021-07-02 NOTE — Telephone Encounter (Signed)
Pt informed

## 2021-07-10 ENCOUNTER — Inpatient Hospital Stay: Admit: 2021-07-10 | Discharge: 2021-07-10 | Payer: BLUE CROSS/BLUE SHIELD | Primary: Internal Medicine

## 2021-07-10 DIAGNOSIS — M25511 Pain in right shoulder: Secondary | ICD-10-CM
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
## 2021-07-16 ENCOUNTER — Inpatient Hospital Stay: Admit: 2021-07-16 | Discharge: 2021-07-16 | Payer: BLUE CROSS/BLUE SHIELD | Primary: Internal Medicine

## 2021-07-16 DIAGNOSIS — M25511 Pain in right shoulder: Secondary | ICD-10-CM

## 2021-07-16 NOTE — Other (Signed)
[x]  [facility]  [address]  [address]  P: [phone]  F: [phone]     Physical Therapy Daily Treatment Note    Date:  07/16/2021  Patient Name:  Monica Wood    DOB:  07-May-1976  MRN: [id number]  Physician: Laurelyn Sickle, APRN - CNP                                    Insurance: [employer]  Medical Diagnosis: S16.1XXA (ICD-10-CM) - Neck strain, initial encounter            Rehab Codes: M54.2  Onset Date: 2021-06-10                                     Next Dr.'s appt  Visit# / total visits: 2/16     Cancels/No Shows: 0    Subjective:    Pain:  [x]  Yes  []  No Location: L cervical/UT   Pain Rating: (0-10 scale) 4/10  Pain altered Tx:  []  No  [x]  Yes  Action: Held R side scalene stretch.   Comments: Pt mentioned that she is really stiff today.     Objective:   Precautions: General  Exercises:  Exercise  Neck Pain Reps/ Time Weight/ Level Comments    UBE                     UT S 3x30"     x   Levator S 3x30"     x   Scalines S 3x30"  R side stretch bothers her L UT area x   Posterior capsule S 3x30"        Doorway pec S 3x30"        Doorway lat S 3x30"     x              Cervical AROM 2x10     x                         Other: Manual: SOR, manual UT/levator S, Hypervolt surrounding area     Specific Instructions for next treatment: Continue tx per POC    Instructions for subsequent visits:      Treatment Charges: Mins Units   [x]   Modalities: HP 20 0   [x]   Ther Exercise 15 1   [x]   Manual Therapy 25 2   []   Ther Activities     []   Aquatics     []   Vasocompression     []   Other     Total Treatment time  60 3       Assessment: [x]  Progressing toward goals.    []  No change.     [x]  Other: Initiated session with seated cervical stretches with pt having discomfort with R side scalene S d/t L side tightness in UT. Performed supine manual with SOR and manual UT/levator stretches. Performed seated manual via hypervolt to L side cervical and surrounding areas. Finished  with standing lat stretch followed up with HP in supine.     Goals  MET NOT MET ON-  GOING  Details   Date Addressed: NA           STG: To be met in 8  treatments            1. ? Pain: Decrease pain levels to 4/10 with lifting to help ease tolerance to all ADLs and home care. []   []   []       2. ? ROM: Increase flexibility in cervical spine to help improve pain-free cervical ROM in all directions to WNL to help reduce need for compensations with driving.  []   []   []       3. Improve score on assessment tool NDI from 26% impairment to less than 16% impairment, demonstrating improved tolerance to activity to help ease lifting objects for better tolerance to ADLs and work activities.  []   []   []       4. Pt will self-report better tolerance to sleeping to help improve sleep quality.           5. Independent with Home Exercise Programs []   []   []         []   []   []       Date Addressed: NA           LTG: To be met in 16 treatments           1. Improve score on assessment tool NDI from 26% impairment to less than 16% impairment, demonstrating improved tolerance to activity to help ease lifting objects for better tolerance to ADLs and work activities.  []   []   []       2. Reduce pain levels to 2/10 or less with lifting to help ease tolerance to all ADLs and home care. []   []   []       3. ? Strength: Increase L UE strength to 4+/5 grossly to help reduce need for compensations with UT with lifting objects to promote proper mechanics and avoid causing increased tightness in UT.             Pt. Education:  [x]  Yes  []  No  [x]  Reviewed Prior HEP/Ed  Method of Education: [x]  Verbal  []  Demo  []  Written  Comprehension of Education:  [x]  Verbalizes understanding.  []  Demonstrates understanding.  []  Needs review.  []  Demonstrates/verbalizes HEP/Ed previously given.     Plan: [x]  Continue with current plan of care towards goals.        Time In: 8:00 am            Time Out:  8:57 am    Electronically signed by:  Sanjuana Mae, PTA

## 2021-07-18 ENCOUNTER — Inpatient Hospital Stay: Admit: 2021-07-18 | Discharge: 2021-07-18 | Payer: BLUE CROSS/BLUE SHIELD | Primary: Internal Medicine

## 2021-07-18 NOTE — Other (Signed)
[x]  [facility]  [address]  [address]  P: [phone]  F: [phone]     Physical Therapy Daily Treatment Note    Date:  07/18/2021  Patient Name:  Monica Wood    DOB:  1976/01/17  MRN: [id number]  Physician: Laurelyn Sickle, APRN - CNP                                    Insurance: [employer]  Medical Diagnosis: S16.1XXA (ICD-10-CM) - Neck strain, initial encounter            Rehab Codes: M54.2  Onset Date: June 06, 2021                                     Next Dr.'s appt  Visit# / total visits: 3/16     Cancels/No Shows: 0    Subjective:  Pt arrives to PT stating that she is starting to feel better, rating her pain at 1-2/10 upon arrival.  Pain:  [x]  Yes  []  No  Location: L cervical/UT   Pain Rating: (0-10 scale) 1-2/10  Pain altered Tx:  [x]  No  []  Yes  Action:   Comments:      Objective:  Modalities: HP Cervical Spine x 10 minutes  Precautions: General  Exercises:  Exercise  Neck Pain Reps/ Time Weight/ Level Comments    UBE  6'     x              UT S 3x30"     x   Levator S 3x30"     x   Scalene S 3x30"  R side stretch bothers her L UT area x   Posterior capsule S 3x30"     x   Doorway pec S 3x30"     x   Doorway lat S 3x30"     x              Cervical AROM 2x10, 5"     x               Chin tucks 2x10, 3"    Standing against wall  x          Prone retraction 2x10, 5"   x   Prone depression 2x10, 5"   x          Other: Hypervolt L UT     Specific Instructions for next treatment: Continue tx per POC        Treatment Charges: Mins Units   [x]   Modalities: HP 8 0   [x]   Ther Exercise 40 3   [x]   Manual Therapy 8 0   []   Ther Activities     []   Aquatics     []   Vasocompression     []   Other     Total Treatment time 56 3       Assessment: [x]  Progressing toward goals. Able to focus most of today's treatment on exercise d/t less pain coming into tx today with good tolerance. Pt with better tolerance to scalene S today, able to complete without pain, but still noting  a "pull" with completion to the R on the L side of her neck. Added chin tucks to program today, but pt states that she  had some pain with completion, so educated to not push exercise as hard with pt then noting that it was tolerable to complete. Also added prone scap stabilizer strengthening exercises to progress strength with no complaints. Completed manual today with Hypervolt to L UT with pt noting less tension following performance. Ended with HP to cervical spine.     []  No change.     []  Other:     Goals  MET NOT MET ON-  GOING  Details   Date Addressed: NA           STG: To be met in 8 treatments            1. ? Pain: Decrease pain levels to 4/10 with lifting to help ease tolerance to all ADLs and home care. []   []   []       2. ? ROM: Increase flexibility in cervical spine to help improve pain-free cervical ROM in all directions to WNL to help reduce need for compensations with driving.  []   []   []       3. Improve score on assessment tool NDI from 26% impairment to less than 16% impairment, demonstrating improved tolerance to activity to help ease lifting objects for better tolerance to ADLs and work activities.  []   []   []       4. Pt will self-report better tolerance to sleeping to help improve sleep quality.           5. Independent with Home Exercise Programs []   []   []         []   []   []       Date Addressed: NA           LTG: To be met in 16 treatments           1. Improve score on assessment tool NDI from 26% impairment to less than 16% impairment, demonstrating improved tolerance to activity to help ease lifting objects for better tolerance to ADLs and work activities.  []   []   []       2. Reduce pain levels to 2/10 or less with lifting to help ease tolerance to all ADLs and home care. []   []   []       3. ? Strength: Increase L UE strength to 4+/5 grossly to help reduce need for compensations with UT with lifting objects to promote proper mechanics and avoid causing increased tightness in UT.              Pt. Education:  [x]  Yes  []  No  [x]  Reviewed Prior HEP/Ed  Method of Education: [x]  Verbal  [x]  Demo  []  Written  Comprehension of Education:  [x]  Verbalizes understanding.  [x]  Demonstrates understanding.  [x]  Needs review.  []  Demonstrates/verbalizes HEP/Ed previously given.     Plan: [x]  Continue with current plan of care towards goals.        Time In: 0800           Time Out:  0859    Electronically signed by:  Deeann Dowse, PT

## 2021-07-24 ENCOUNTER — Encounter: Payer: BLUE CROSS/BLUE SHIELD | Primary: Internal Medicine

## 2021-07-24 NOTE — Care Coordination-Inpatient (Signed)
[]   [facility] &  Therapy  [address]    [phone]  F: [phone]   []  [facility]  Outpatient Rehabilitation &  Therapy  [address]   [address]  P: [phone]  F: [phone]  [x]  [facility]  Outpatient Rehabilitation &  Therapy  [address]  P: [phone]  F: [phone] []  [facility]  Outpatient Rehabilitation &  Therapy  [address]  P: [phone]  F: [phone]  []  [facility]  Outpatient Rehabilitation &  Therapy  [address]   [phone]: [phone]  F: [phone]   []  [facility]  [address]  [phone]: [phone]   F: [phone]     Physical Therapy Cancel/No Show note    Date: 07/24/2021  Patient: Monica Wood  DOB: 12-20-1975  MRN: [id number]    Cancels/No Shows to date: 1/0    For today's appointment patient:    [x]   Cancelled    []  Rescheduled appointment    []  No-show     Reason given by patient:    []   Patient ill    []   Conflicting appointment    []  No transportation      []  Conflict with work    []  No reason given    []  Weather related    []  COVID-19    [x]  Other:  Pt with eye swelling today   Comments:        [x]  Next appointment was confirmed    Electronically signed by: 09/21/2021, PT

## 2021-07-26 ENCOUNTER — Inpatient Hospital Stay: Admit: 2021-07-26 | Discharge: 2021-07-26 | Payer: BLUE CROSS/BLUE SHIELD | Primary: Internal Medicine

## 2021-07-26 NOTE — Other (Signed)
[x]  [place] [facility]  [address]  [address]  P: [phone]  F: [phone]     Physical Therapy Daily Treatment Note    Date:  07/26/2021  Patient Name:  Monica Wood    DOB:  January 19, 1976  MRN: [id number]  Physician: Laurelyn Sickle, APRN - CNP                                    Insurance: [employer]  Medical Diagnosis: S16.1XXA (ICD-10-CM) - Neck strain, initial encounter            Rehab Codes: M54.2  Onset Date: 2021/06/01                                     Next Dr.'s appt  Visit# / total visits: 4/16     Cancels/No Shows: 0    Subjective:  Pt reports to PT with no pain, states that she is doing good.   Pain:  []  Yes  [x]  No  Location: L cervical/UT   Pain Rating: (0-10 scale) 0/10  Pain altered Tx:  [x]  No  []  Yes  Action:   Comments:      Objective:  Modalities: HP Cervical Spine x 10 minutes  Precautions: General  Exercises:  Exercise  Neck Pain Reps/ Time Weight/ Level Comments    UBE  6'     x              UT S 3x30"     x   Levator S 3x30"     x   Scalene S 3x30"  R side stretch bothers her L UT area x   Posterior capsule S 3x30"     x   Doorway pec S 3x30"        Doorway lat S 3x30"                   Cervical AROM 2x10, 5"     x               Chin tucks 2x15, 3"    Standing against wall  x          Prone retraction 2x12, 5"   x   Prone depression 2x12, 5"   x          Ball on wall 2x10 1.5#  x          Other: Hypervolt L UT     Specific Instructions for next treatment: Continue tx per POC        Treatment Charges: Mins Units   [x]   Modalities: HP 10 0   [x]   Ther Exercise 41 3   [x]   Manual Therapy 6 0   []   Ther Activities     []   Aquatics     []   Vasocompression     []   Other     Total Treatment time 57 3       Assessment: [x]  Progressing toward goals. Completed tx per log above with no issues. Progressed reps with chin tucks to help improve endurance of deep neck flexors, as well as prone exercises to help scapular stabilizer endurance. Continued to  educate pt to avoid pushing exercises to pain. Added ball on wall for additional shoulder/scapular stabilizer  strength with no issue, just fatigue. Manual also completed per chart to keep working on reducing tension in cervical spine, good releases noted. Ended session with HP.    []  No change.     []  Other:     Goals  MET NOT MET ON-  GOING  Details   Date Addressed: NA           STG: To be met in 8 treatments            1. ? Pain: Decrease pain levels to 4/10 with lifting to help ease tolerance to all ADLs and home care. []   []   []       2. ? ROM: Increase flexibility in cervical spine to help improve pain-free cervical ROM in all directions to WNL to help reduce need for compensations with driving.  []   []   []       3. Improve score on assessment tool NDI from 26% impairment to less than 16% impairment, demonstrating improved tolerance to activity to help ease lifting objects for better tolerance to ADLs and work activities.  []   []   []       4. Pt will self-report better tolerance to sleeping to help improve sleep quality.           5. Independent with Home Exercise Programs []   []   []         []   []   []       Date Addressed: NA           LTG: To be met in 16 treatments           1. Improve score on assessment tool NDI from 26% impairment to less than 16% impairment, demonstrating improved tolerance to activity to help ease lifting objects for better tolerance to ADLs and work activities.  []   []   []       2. Reduce pain levels to 2/10 or less with lifting to help ease tolerance to all ADLs and home care. []   []   []       3. ? Strength: Increase L UE strength to 4+/5 grossly to help reduce need for compensations with UT with lifting objects to promote proper mechanics and avoid causing increased tightness in UT.             Pt. Education:  [x]  Yes  []  No  [x]  Reviewed Prior HEP/Ed  Method of Education: [x]  Verbal  [x]  Demo  []  Written  Comprehension of Education:  [x]  Verbalizes understanding.  [x]  Demonstrates  understanding.  [x]  Needs review.  []  Demonstrates/verbalizes HEP/Ed previously given.     Plan: [x]  Continue with current plan of care towards goals.        Time In: 1200          Time Out: 1259     Electronically signed by:  Deeann Dowse, PT

## 2021-07-30 ENCOUNTER — Inpatient Hospital Stay: Payer: BLUE CROSS/BLUE SHIELD | Primary: Internal Medicine

## 2021-07-30 NOTE — Other (Signed)
[x]  [place]  [facility]  [address]  P: [phone]  F: [phone]     Physical Therapy Daily Treatment Note    Date:  07/30/2021  Patient Name:  Monica Wood    DOB:  [birth year]  MRN: [id number]  Physician: Laurelyn Sickle, APRN - CNP                                    Insurance: [employer]  Medical Diagnosis: S16.1XXA (ICD-10-CM) - Neck strain, initial encounter            Rehab Codes: M54.2  Onset Date: 2021-06-03                                     Next Dr.'s appt  Visit# / total visits: 4/16     Cancels/No Shows: 0    Subjective:  Pt arrived denying pain, noting tightness in cervical region, more left compared to right.   Pain:  []  Yes  [x]  No  Location: L cervical/UT   Pain Rating: (0-10 scale) 0/10  Pain altered Tx:  [x]  No  []  Yes  Action:   Comments:      Objective:  Modalities: HP Cervical Spine x 10 minutes  Precautions: General  Exercises:  Exercise  Neck Pain Reps/ Time Weight/ Level Comments    UBE  6'     x              UT S 3x30"     x   Levator S 3x30"     x   Scalene S 3x30"  R side stretch bothers her L UT area x   Posterior capsule S 3x30"     x   Doorway pec S 3x30"        Doorway lat S 3x30"                   Cervical AROM 2x10, 5"     x               Chin tucks 2x15, 3"    Standing against wall  x          Prone retraction 2x12, 5"   x   Prone depression 2x12, 5"   x          Ball on wall 2x10 1.5#  x   TB rows 2x10 lime     TB extension  2x10 lime     Other: Hypervolt L UT     Specific Instructions for next treatment: Continue tx per POC        Treatment Charges: Mins Units   [x]   Modalities: HP 10 0   [x]   Ther Exercise 41 3   [x]   Manual Therapy 6 0   []   Ther Activities     []   Aquatics     []   Vasocompression     []   Other     Total Treatment time 57 3       Assessment: [x]  Progressing toward goals. Able to complete above stretches this date with good tolerance. Performed hypervolt to L UT with some tightness and trigger points  noted. Added in TB rows and shoulder extension for increased shoulder strength and stability with good tolerance  noted. Vocal cues provided for proper set up with ball on wall as well as decreased upper trap compensation with rows with good carryover noted. Continued to end treatment with HP.     []  No change.     []  Other:     Goals  MET NOT MET ON-  GOING  Details   Date Addressed: NA           STG: To be met in 8 treatments            1. ? Pain: Decrease pain levels to 4/10 with lifting to help ease tolerance to all ADLs and home care. []   []   []       2. ? ROM: Increase flexibility in cervical spine to help improve pain-free cervical ROM in all directions to WNL to help reduce need for compensations with driving.  []   []   []       3. Improve score on assessment tool NDI from 26% impairment to less than 16% impairment, demonstrating improved tolerance to activity to help ease lifting objects for better tolerance to ADLs and work activities.  []   []   []       4. Pt will self-report better tolerance to sleeping to help improve sleep quality.           5. Independent with Home Exercise Programs []   []   []         []   []   []       Date Addressed: NA           LTG: To be met in 16 treatments           1. Improve score on assessment tool NDI from 26% impairment to less than 16% impairment, demonstrating improved tolerance to activity to help ease lifting objects for better tolerance to ADLs and work activities.  []   []   []       2. Reduce pain levels to 2/10 or less with lifting to help ease tolerance to all ADLs and home care. []   []   []       3. ? Strength: Increase L UE strength to 4+/5 grossly to help reduce need for compensations with UT with lifting objects to promote proper mechanics and avoid causing increased tightness in UT.             Pt. Education:  [x]  Yes  []  No  [x]  Reviewed Prior HEP/Ed  Method of Education: [x]  Verbal  [x]  Demo  []  Written  Comprehension of Education:  [x]  Verbalizes understanding.  [x]   Demonstrates understanding.  [x]  Needs review.  []  Demonstrates/verbalizes HEP/Ed previously given.     Plan: [x]  Continue with current plan of care towards goals.        Time In: 7:59 am          Time Out: 8:59 am     Electronically signed by:  Elta Guadeloupe, PTA

## 2021-08-01 ENCOUNTER — Inpatient Hospital Stay: Admit: 2021-08-01 | Discharge: 2021-08-01 | Payer: BLUE CROSS/BLUE SHIELD | Primary: Internal Medicine

## 2021-08-01 NOTE — Other (Signed)
[x]  [place]  [facility]  [address]  [address]  P: [phone]  F: [phone]     Physical Therapy Daily Treatment Note    Date:  08/01/2021  Patient Name:  Monica Wood    DOB:  1976/02/03  MRN: [id number]  Physician: Laurelyn Sickle, APRN - CNP                                    Insurance: [employer]  Medical Diagnosis: S16.1XXA (ICD-10-CM) - Neck strain, initial encounter            Rehab Codes: M54.2  Onset Date: 06/24/2021                                     Next Dr.'s appt  Visit# / total visits: 6/16     Cancels/No Shows: 0    Subjective:  Pt reporting to PT with no pain today, no new complaints.   Pain:  []  Yes  [x]  No  Location: L cervical/UT   Pain Rating: (0-10 scale) 0/10  Pain altered Tx:  [x]  No  []  Yes  Action:   Comments:      Objective:  Modalities: HP Cervical Spine x 10 minutes  Precautions: General  Exercises:  Exercise  Neck Pain Reps/ Time Weight/ Level Comments    UBE  6'     x              UT S 3x30"     x   Levator S 3x30"     x   Scalene S 3x30"  R side stretch bothers her L UT area x   Posterior capsule S 3x30"     x   Doorway pec S 3x30"        Doorway lat S 3x30"                   Cervical AROM 2x10, 5"     x               Chin tucks 2x15, 3"    Standing against wall            Prone retraction 2x15, 5"   x   Prone depression 2x15, 5"   x          Ball on wall 2x10 1.5#  x   TB rows 2x15 lime  x   TB extension  2x15 lime  x   TB HAB 2x10 Lime  x   Other: Hypervolt L UT- Held today     Specific Instructions for next treatment: Continue tx per POC        Treatment Charges: Mins Units   [x]   Modalities: HP 8 0   [x]   Ther Exercise 46 3   []   Manual Therapy     []   Ther Activities     []   Aquatics     []   Vasocompression     []   Other     Total Treatment time 54 3       Assessment: [x]  Progressing toward goals. Completed tx per POC above with no complaints. Progressed program with increased reps with tband exercises, as well as increased reps  with prone exercises today with no complaints. Added banded HAB  to program today with cueing needed to avoid UT compensations, good carryover. Pt continues to fatigue with all scapular stabilizer exercises throughout treatment, but able to complete all reps. Continue progressing pt as able.     []  No change.     []  Other:     Goals  MET NOT MET ON-  GOING  Details   Date Addressed: NA           STG: To be met in 8 treatments            1. ? Pain: Decrease pain levels to 4/10 with lifting to help ease tolerance to all ADLs and home care. []   []   []       2. ? ROM: Increase flexibility in cervical spine to help improve pain-free cervical ROM in all directions to WNL to help reduce need for compensations with driving.  []   []   []       3. Improve score on assessment tool NDI from 26% impairment to less than 16% impairment, demonstrating improved tolerance to activity to help ease lifting objects for better tolerance to ADLs and work activities.  []   []   []       4. Pt will self-report better tolerance to sleeping to help improve sleep quality.           5. Independent with Home Exercise Programs []   []   []         []   []   []       Date Addressed: NA           LTG: To be met in 16 treatments           1. Improve score on assessment tool NDI from 26% impairment to less than 16% impairment, demonstrating improved tolerance to activity to help ease lifting objects for better tolerance to ADLs and work activities.  []   []   []       2. Reduce pain levels to 2/10 or less with lifting to help ease tolerance to all ADLs and home care. []   []   []       3. ? Strength: Increase L UE strength to 4+/5 grossly to help reduce need for compensations with UT with lifting objects to promote proper mechanics and avoid causing increased tightness in UT.             Pt. Education:  [x]  Yes  []  No  [x]  Reviewed Prior HEP/Ed  Method of Education: [x]  Verbal  [x]  Demo  []  Written  Comprehension of Education:  [x]  Verbalizes understanding.  [x]   Demonstrates understanding.  [x]  Needs review.  []  Demonstrates/verbalizes HEP/Ed previously given.     Plan: [x]  Continue with current plan of care towards goals.        Time In: 0801          Time Out: 0900    Electronically signed by:  Deeann Dowse, PT

## 2021-08-06 ENCOUNTER — Inpatient Hospital Stay: Admit: 2021-08-06 | Discharge: 2021-08-06 | Payer: BLUE CROSS/BLUE SHIELD | Primary: Internal Medicine

## 2021-08-06 NOTE — Other (Signed)
_0  [facility]  [address]  P: [phone]  F: [phone]     Physical Therapy Daily Treatment Note    Date:  08/06/2021  Patient Name:  Monica Wood    DOB:  Dec 28, 1975  MRN: [id number]  Physician: Laurelyn Sickle, APRN - CNP                                    Insurance: [employer]  Medical Diagnosis: S16.1XXA (ICD-10-CM) - Neck strain, initial encounter            Rehab Codes: M54.2  Onset Date: 06/27/2021                                     Next Dr.'s appt  Visit# / total visits: 7/16     Cancels/No Shows: 0    Subjective:  Pt reporting she feels she is turning a corner. Feeling good this morning.   Pain:  _1  Yes  _2  No  Location: L cervical/UT   Pain Rating: (0-10 scale) 0/10  Pain altered Tx:  _3  No  _4  Yes  Action:   Comments:      Objective:  Modalities: HP Cervical Spine x 10 minutes  Precautions: General  Exercises:  Exercise  Neck Pain Reps/ Time Weight/ Level Comments    UBE  6'                   UT S 3x30"     x   Levator S 3x30"     x   Scalene S 3x30"  R side stretch bothers her L UT area x   Posterior capsule S 3x30"     x   Doorway pec S 3x30"        Doorway lat S 3x30"                   Cervical AROM 2x10, 5"     x               Chin tucks 2x15, 3"    Standing against wall            Prone retraction 2x15, 5"   x   Prone depression 2x15, 5"   x          Ball on wall 2x15 1.5#  x   HAB on wall 2x10 orange  x   TB rows 2x15 blue  x   TB extension  2x15 blue  x   TB HAB 2x10 blue  x   Other: [employer] L UT- Held today     Specific Instructions for next treatment: Continue tx per POC        Treatment Charges: Mins Units   _5   Modalities: HP 10 0   _6   Ther Exercise 40 3   _7   Manual Therapy     _8   Ther Activities     _9   Aquatics     _10   Vasocompression     _11   Other     Total Treatment time 50 3       Assessment: _12  Progressing toward goals. Progressed pt with adding in HAB on wall, as well as increasing reps for ball on wall, and  increasing resistance  for TB work with good muscle fatigue noted. Pt needing cues for proper set up with ball on the wall with good carryover noted. Ended with HP to cervical region this date. Potentially D/C to HEP after next visit, pt stating she feels she is able to perform stretches and strengthening at home and work to continue with postural work as well cervical ROM.     _0  No change.     _1  Other:     Goals  MET NOT MET ON-  GOING  Details   Date Addressed: NA           STG: To be met in 8 treatments            1. ? Pain: Decrease pain levels to 4/10 with lifting to help ease tolerance to all ADLs and home care. _2   _3   _4       2. ? ROM: Increase flexibility in cervical spine to help improve pain-free cervical ROM in all directions to WNL to help reduce need for compensations with driving.  _5   _6   _7       3. Improve score on assessment tool NDI from 26% impairment to less than 16% impairment, demonstrating improved tolerance to activity to help ease lifting objects for better tolerance to ADLs and work activities.  _8   _9   _10       4. Pt will self-report better tolerance to sleeping to help improve sleep quality.           5. Independent with Home Exercise Programs _11   _12   _13         _14   _15   _16       Date Addressed: NA           LTG: To be met in 16 treatments           1. Improve score on assessment tool NDI from 26% impairment to less than 16% impairment, demonstrating improved tolerance to activity to help ease lifting objects for better tolerance to ADLs and work activities.  _17   _18   _19       2. Reduce pain levels to 2/10 or less with lifting to help ease tolerance to all ADLs and home care. _20   _21   _22       3. ? Strength: Increase L UE strength to 4+/5 grossly to help reduce need for compensations with UT with lifting objects to promote proper mechanics and avoid causing increased tightness in UT.             Pt. Education:  _23  Yes  _24  No  _25  Reviewed Prior HEP/Ed  Method of Education: _26  Verbal  _27  Demo   _28  Written  Comprehension of Education:  _29  Verbalizes understanding.  _30  Demonstrates understanding.  _31  Needs review.  _32  Demonstrates/verbalizes HEP/Ed previously given.     Plan: _33  Continue with current plan of care towards goals.        Time In: 8:00 am          Time Out: 8:55 am    Electronically signed by:  Elta Guadeloupe, PTA

## 2021-08-06 NOTE — Discharge Summary (Signed)
[facility]  Phone: [phone]  Fax: [phone]      Physical Therapy Discharge Note    Date: 08/12/2021      Patient: Monica Wood  DOB: 05-05-1976  MRN: [id number]    Physician: Laurelyn Sickle, APRN - CNP                                    Insurance: [employer]  Medical Diagnosis: S16.1XXA (ICD-10-CM) - Neck strain, initial encounter            Rehab Codes: M54.2  Onset Date: Jun 22, 2021                                       Visit# / total visits: 7                                  Cancels/No Shows: 2/0  Date of initial visit: 07/10/21                   []  Patient recovered from conditions. Treatment goals were met.  []  Patient received maximum benefit. No further therapy indicated at this time.  []  Patient demonstrated improvement from condition with  ** Of  ** Short term goals met.  [] Patient demonstrated improvement from condition with **   Of **  Long term goals met.  [x]  Patient to continue exercise/home instructions independently.  []  Therapy interrupted due to:    []  Patient has 2 or more no shows/cancels, is discontinued per our policy.    []  Patient has completed prescribed number of treatment sessions.    []  Other:      Pain level at evaluation was       3/10 and at discharge was       0/10    It Is My Understanding That The:  []  Patient returned to work.  [x]  Patient demonstrated improved level of function.  [x]  Patient returned to previous functional level.  []  Patient's current functional status is unknown due to no-shows  []  Other:     Recommendations/Comments:                   Treatment Included:     [x]  Therapeutic Exercise   97110  []  Iontophoresis: 4 mg/mL Dexamethasone Sodium Phosphate 40-120 mAmin  97033   []  Therapeutic Activity  97530 []  Vasopneumatic cold with compression  97016    []  Gait Training   97116 []  Ultrasound   97035   []  Neuromuscular Re-education  97112 []  Electrical Stimulation Unattended  97014    [x]  Manual Therapy  97140 []  Electrical Stimulation Attended  97032   [x]  Instruction in HEP  []  Lumbar/Cervical Traction  97012   []  Aquatic Therapy   97113 [x]  Cold/hotpack    []  Massage   97124      []  Dry Needling, 1 or 2 muscles  20560   []  Biofeedback, first 15 minutes   90912  []  Biofeedback, additional 15 minutes   90913 []  Dry Needling, 3 or more muscles  20561                If you have any questions or concerns  regarding this patient's care, please contact us.   Thank you for your referral.      Electronically signed by: Deeann Dowse, PT

## 2021-08-08 ENCOUNTER — Inpatient Hospital Stay: Payer: BLUE CROSS/BLUE SHIELD | Primary: Internal Medicine

## 2021-08-08 NOTE — Care Coordination-Inpatient (Signed)
[]   [facility] &  Therapy  [address]    [phone]  F: [phone]   []  [facility]  Outpatient Rehabilitation &  Therapy  [address]   [address]  P: [phone]  F: [phone]  [x]  [facility]  [facility]  Outpatient Rehabilitation &  Therapy  [address]  P: [phone]  F: [phone] []  [facility]  Outpatient Rehabilitation &  Therapy  [address]  P: [phone]  F: [phone]  []  [facility]  Outpatient Rehabilitation &  Therapy  [address]   [address]   [phone]: [phone]  F: [phone]   []  [facility] & Therapy  [address]  [phone]: [phone]   F: [phone]     Physical Therapy Cancel/No Show note    Date: 08/08/2021  Patient: Monica Wood  DOB: 05/31/1976  MRN: [id number]    Cancels/No Shows to date: 2/0    For today's appointment patient:    [x]   Cancelled    []  Rescheduled appointment    []  No-show     Reason given by patient:    []   Patient ill    []   Conflicting appointment    []  No transportation      []  Conflict with work    [x]  No reason given    []  Weather related    []  COVID-19    [x]  Other:  Pt LVM to cancel   Comments:        [x]  Next appointment was confirmed    Electronically signed by: [phone], 08/10/2021

## 2022-01-09 ENCOUNTER — Encounter

## 2022-01-09 NOTE — Telephone Encounter (Signed)
Patient has not established with any new provider at the office. Refill denied, patient needs appt to establish with one of the providers.

## 2022-01-16 MED ORDER — DULOXETINE HCL 30 MG PO CPEP
30 MG | ORAL_CAPSULE | ORAL | 0 refills | Status: DC
Start: 2022-01-16 — End: 2022-02-17

## 2022-01-16 MED ORDER — BUPROPION HCL ER (XL) 150 MG PO TB24
150 MG | ORAL_TABLET | Freq: Every morning | ORAL | 0 refills | Status: DC
Start: 2022-01-16 — End: 2022-02-17

## 2022-01-16 NOTE — Telephone Encounter (Signed)
-----   Message from [facility] sent at 01/16/2022  1:35 PM EDT -----  Subject: Refill Request    QUESTIONS  Name of Medication? DULoxetine (CYMBALTA) 30 MG extended release capsule  Patient-reported dosage and instructions? 30 mg once daily  How many days do you have left? 10  Preferred Pharmacy? [facility] [id number]  Pharmacy phone number (if available)? [phone]  ---------------------------------------------------------------------------  --------------,  Name of Medication? buPROPion (WELLBUTRIN XL) 150 MG extended release   tablet  Patient-reported dosage and instructions? 300 mg once daily  How many days do you have left? 10  Preferred Pharmacy? [facility] [id number]  Pharmacy phone number (if available)? [phone]  ---------------------------------------------------------------------------  --------------  CALL BACK INFO  What is the best way for the office to contact you? OK to leave message on   voicemail  Preferred Call Back Phone Number? [phone]  ---------------------------------------------------------------------------  --------------  SCRIPT ANSWERS  Relationship to Patient? Self

## 2022-01-16 NOTE — Telephone Encounter (Signed)
Last Visit Date: 10/31/2020   Next Visit Date: 02/19/2022

## 2022-02-08 NOTE — Telephone Encounter (Signed)
Last Visit Date: 10/31/2020  Next Visit Date: 02/19/2022

## 2022-02-17 MED ORDER — BUPROPION HCL ER (XL) 150 MG PO TB24
150 MG | ORAL_TABLET | Freq: Every morning | ORAL | 0 refills | Status: DC
Start: 2022-02-17 — End: 2022-02-19

## 2022-02-17 MED ORDER — DULOXETINE HCL 30 MG PO CPEP
30 MG | ORAL_CAPSULE | ORAL | 0 refills | Status: DC
Start: 2022-02-17 — End: 2022-02-19

## 2022-02-19 ENCOUNTER — Ambulatory Visit
Admit: 2022-02-19 | Discharge: 2022-02-19 | Payer: PRIVATE HEALTH INSURANCE | Attending: Nurse Practitioner | Primary: Nurse Practitioner

## 2022-02-19 DIAGNOSIS — Z7689 Persons encountering health services in other specified circumstances: Secondary | ICD-10-CM

## 2022-02-19 MED ORDER — BUPROPION HCL ER (XL) 150 MG PO TB24
150 MG | ORAL_TABLET | Freq: Every morning | ORAL | 1 refills | Status: AC
Start: 2022-02-19 — End: ?

## 2022-02-19 MED ORDER — DULOXETINE HCL 30 MG PO CPEP
30 MG | ORAL_CAPSULE | Freq: Every day | ORAL | 1 refills | Status: DC
Start: 2022-02-19 — End: 2023-08-27

## 2022-02-19 MED ORDER — DULOXETINE HCL 60 MG PO CPEP
60 MG | ORAL_CAPSULE | Freq: Every day | ORAL | 1 refills | Status: DC
Start: 2022-02-19 — End: 2022-08-17

## 2022-02-19 NOTE — Progress Notes (Signed)
[facility]  [facility]  [address]  [address]  [place] [address]  Dept: [phone]  Dept Fax: [phone]    Monica Wood is a 46 y.o. female who presents today for her medical conditions/complaintsas noted below.  Monica Wood is c/o of New Patient, Discuss Medications (Discuss increasing cymbalta ), Referral - General (Would like a referral to a new psychiatrist ), and Health Maintenance (Had a colonoscopy in the past, in [place])        HPI:     Pt presents to establish care. Previous pcp was dr. Clayborne Dana  Blood pressure stable  Weight is up 5 pounds since last visit    Patient presents establish care.  She has a past medical history of depression.  She was following with psychiatry.  She is doing well on bupropion and duloxetine.  She is looking for a new psychiatrist.  She also has fibromyalgia.  This is well-controlled with duloxetine.    She does occasionally take trazodone for sleep    Strong family history of ADHD.  She would like to be tested.  She does plan on plan of the comprehensive behavioral health which is where her son goes.    She believes her last Pap was 5 years ago.  She is overdue for mammogram.        Past Medical History:   Diagnosis Date    Bursitis     Fibromyalgia     Scoliosis     Tendinitis       Past Surgical History:   Procedure Laterality Date    ABDOMINOPLASTY      ESOPHAGOGASTRIC FUNDOPLICATION  2000    GALLBLADDER SURGERY      TUBAL LIGATION         Family History   Problem Relation Age of Onset    Diabetes Mother     Diabetes Maternal Grandmother        Social History     Tobacco Use    Smoking status: Never    Smokeless tobacco: Never   Substance Use Topics    Alcohol use: Yes     Comment: 3/4 times a week      Current Outpatient Medications   Medication Sig Dispense Refill    DULoxetine (CYMBALTA) 60 MG extended release capsule Take 1 capsule by mouth daily 90 capsule 1    DULoxetine (CYMBALTA) 30 MG extended release capsule Take 1  capsule by mouth daily 90 capsule 1    buPROPion (WELLBUTRIN XL) 150 MG extended release tablet Take 1 tablet by mouth every morning 90 tablet 1    tiZANidine (ZANAFLEX) 2 MG tablet Take 1 tablet by mouth daily as needed (back muscle) 30 tablet 1    TRAZODONE HCL PO Take by mouth      albuterol sulfate HFA (PROVENTIL HFA) 108 (90 Base) MCG/ACT inhaler Inhale 2 puffs into the lungs every 6 hours as needed for Wheezing or Shortness of Breath 18 g 0     No current facility-administered medications for this visit.     Allergies   Allergen Reactions    Codeine      Anaphylaxis     Morphine        Health Maintenance   Topic Date Due    COVID-19 Vaccine (1) Never done    DTaP/Tdap/Td vaccine (1 - Tdap) Never done    Cervical cancer screen  Never done    Colorectal Cancer Screen  Never done  Flu vaccine (1) Never done    Depression Monitoring  02/20/2023    Lipids  11/06/2025    Hepatitis A vaccine  Aged Out    Hib vaccine  Aged Out    Meningococcal (ACWY) vaccine  Aged Out    Pneumococcal 0-64 years Vaccine  Aged Out    Depression Screen  Discontinued    Diabetes screen  Discontinued    Hepatitis C screen  Discontinued    HIV screen  Discontinued       :     Review of Systems   Constitutional:  Negative for chills, fatigue and fever.   HENT:  Negative for ear discharge, ear pain, sinus pressure, sinus pain, sore throat and trouble swallowing.    Eyes:  Negative for discharge, redness and itching.   Respiratory:  Negative for cough, chest tightness, shortness of breath and wheezing.    Cardiovascular:  Negative for chest pain.   Gastrointestinal:  Negative for abdominal pain, diarrhea, nausea and vomiting.   Genitourinary:  Negative for difficulty urinating.   Musculoskeletal:  Negative for arthralgias and neck pain.   Skin:  Negative for rash.   Neurological:  Negative for dizziness, weakness, light-headedness and headaches.   All other systems reviewed and are negative.    Objective:     Physical Exam  Constitutional:        General: She is not in acute distress.     Appearance: Normal appearance. She is obese. She is not ill-appearing.   HENT:      Head: Normocephalic and atraumatic.      Nose: Nose normal. No congestion or rhinorrhea.      Mouth/Throat:      Mouth: Mucous membranes are moist.      Pharynx: Oropharynx is clear. No oropharyngeal exudate or posterior oropharyngeal erythema.   Eyes:      Extraocular Movements: Extraocular movements intact.      Conjunctiva/sclera: Conjunctivae normal.      Pupils: Pupils are equal, round, and reactive to light.   Cardiovascular:      Rate and Rhythm: Normal rate and regular rhythm.      Pulses: Normal pulses.      Heart sounds: Normal heart sounds. No murmur heard.  Pulmonary:      Effort: Pulmonary effort is normal. No respiratory distress.      Breath sounds: Normal breath sounds. No wheezing.   Abdominal:      General: Abdomen is flat. Bowel sounds are normal. There is no distension.      Palpations: Abdomen is soft.      Tenderness: There is no abdominal tenderness. There is no guarding.   Musculoskeletal:         General: Normal range of motion.      Cervical back: Normal range of motion and neck supple.   Skin:     General: Skin is warm and dry.      Capillary Refill: Capillary refill takes less than 2 seconds.   Neurological:      General: No focal deficit present.      Mental Status: She is alert and oriented to person, place, and time.      Motor: No weakness.      Coordination: Coordination normal.      Gait: Gait normal.   Psychiatric:         Mood and Affect: Mood normal.         Behavior: Behavior normal.  Thought Content: Thought content normal.     BP 100/64   Pulse 66   Wt 219 lb (99.3 kg)   SpO2 96%   BMI 37.59 kg/m     Assessment:       Diagnosis Orders   1. Encounter to establish care        2. Colon cancer screening  Fecal DNA Colorectal cancer screening (Cologuard)      3. Breast cancer screening by mammogram  MAM DIGITAL SCREEN W OR WO CAD BILATERAL       4. Encounter for lipid screening for cardiovascular disease  Be Well Health Screen      5. Fibromyalgia        6. Depression, unspecified depression type            :      Return in about 3 months (around 05/22/2022) for pap .  1.  Encounter establish care  Reviewed previous records and lab work  2.  Colon cancer screening  Agreeable to Cologuard  3.  Breast cancer screening  Agreeable to mammogram  4.  Lipid screening  Lab work ordered  5.  Fibromyalgia  Will increase duloxetine to 90 mg extended release daily  6.  Depression  Rx refill for Wellbutrin 150 mg XL release daily    F/u in 3 months for pap   Orders Placed This Encounter   Procedures    Fecal DNA Colorectal cancer screening (Cologuard)    MAM DIGITAL SCREEN W OR WO CAD BILATERAL     Standing Status:   Future     Standing Expiration Date:   02/19/2023     Order Specific Question:   Reason for exam:     Answer:   screening    Be Well Health Screen     Patient needs to be fasting at least 8-12 hours.  Labs included in this order panel:    - Glucose  - Lipid Panel (Total Cholesterol, Triglycerides, HDL, LDL Calculated)         Standing Status:   Future     Standing Expiration Date:   02/20/2023     Orders Placed This Encounter   Medications    DULoxetine (CYMBALTA) 60 MG extended release capsule     Sig: Take 1 capsule by mouth daily     Dispense:  90 capsule     Refill:  1     Total 90 mg a day    DULoxetine (CYMBALTA) 30 MG extended release capsule     Sig: Take 1 capsule by mouth daily     Dispense:  90 capsule     Refill:  1     Total 90 mg    buPROPion (WELLBUTRIN XL) 150 MG extended release tablet     Sig: Take 1 tablet by mouth every morning     Dispense:  90 tablet     Refill:  1       Patient given educational materials - seepatient instructions.  Discussed use, benefit, and side effects of prescribed medications.All patient questions answered.  Pt voiced understanding. Reviewed health maintenance.Instructed to continue current medications, diet and  exercise.  Patient agreedwith treatment plan. Follow up as directed.      Electronically signed by Levander Campion, APRN - CNP on 8/9/2023at 2:41 PM

## 2022-03-14 LAB — FECAL DNA COLORECTAL CANCER SCREENING (COLOGUARD): FIT-DNA (Cologuard): NEGATIVE

## 2022-03-27 ENCOUNTER — Ambulatory Visit
Admit: 2022-03-27 | Discharge: 2022-03-27 | Payer: BLUE CROSS/BLUE SHIELD | Attending: Adult Health | Primary: Nurse Practitioner

## 2022-03-27 DIAGNOSIS — J40 Bronchitis, not specified as acute or chronic: Secondary | ICD-10-CM

## 2022-03-27 MED ORDER — AZITHROMYCIN 250 MG PO TABS
250 MG | PACK | ORAL | 0 refills | Status: AC
Start: 2022-03-27 — End: 2022-04-06

## 2022-03-27 MED ORDER — BENZONATATE 100 MG PO CAPS
100 MG | ORAL_CAPSULE | Freq: Three times a day (TID) | ORAL | 0 refills | Status: AC | PRN
Start: 2022-03-27 — End: 2022-04-06

## 2022-03-27 MED ORDER — ALBUTEROL SULFATE HFA 108 (90 BASE) MCG/ACT IN AERS
108 (90 Base) MCG/ACT | Freq: Four times a day (QID) | RESPIRATORY_TRACT | 0 refills | Status: AC | PRN
Start: 2022-03-27 — End: ?

## 2022-03-27 NOTE — Progress Notes (Signed)
[facility], [facility]  [address]  [address]  [place] [address]  Dept: [phone]  Dept Fax: [phone]    Monica Wood is a 46 y.o. female who presents today for her medical conditions/complaints of   Chief Complaint   Patient presents with    Cough     X 5 days     Chest Congestion          HPI:     BP 102/66   Pulse 57   Temp 97.6 F (36.4 C)   Resp 16   Wt 220 lb (99.8 kg)   SpO2 99%   BMI 37.76 kg/m       HPI  Pt presented to the clinic today with c/o congestion. This is a new problem. The current episode started 5 days ago. The problem has been unchanged since onset. Associated symptoms include: chills, cough- mostly dry, chest tightness .  Pertinent negatives include: No fever, ear pain, SOB, chest pain.  No history of asthma or pneumonia.        Past Medical History:   Diagnosis Date    Bursitis     Fibromyalgia     Scoliosis     Tendinitis         Past Surgical History:   Procedure Laterality Date    ABDOMINOPLASTY      ESOPHAGOGASTRIC FUNDOPLICATION  2000    GALLBLADDER SURGERY      TUBAL LIGATION         Family History   Problem Relation Age of Onset    Diabetes Mother     Diabetes Maternal Grandmother        Social History     Tobacco Use    Smoking status: Never    Smokeless tobacco: Never   Substance Use Topics    Alcohol use: Yes     Comment: 3/4 times a week        Prior to Visit Medications    Medication Sig Taking? Authorizing Provider   albuterol sulfate HFA (PROVENTIL HFA) 108 (90 Base) MCG/ACT inhaler Inhale 2 puffs into the lungs every 6 hours as needed for Wheezing or Shortness of Breath Yes Bunnie Domino, APRN - CNP   azithromycin (ZITHROMAX) 250 MG tablet Take 2 tablets (500 mg) on Day 1, followed by 1 tablet (250 mg) once daily on Days 2 through 5. Yes Bunnie Domino, APRN - CNP   benzonatate (TESSALON PERLES) 100 MG capsule Take 1 capsule by mouth 3 times daily as needed for Cough Yes Bunnie Domino, APRN - CNP   DULoxetine (CYMBALTA) 60 MG extended release capsule Take 1 capsule by mouth daily Yes Levander Campion, APRN - CNP   DULoxetine (CYMBALTA) 30 MG extended release capsule Take 1 capsule by mouth daily Yes Levander Campion, APRN - CNP   buPROPion (WELLBUTRIN XL) 150 MG extended release tablet Take 1 tablet by mouth every morning Yes Levander Campion, APRN - CNP   tiZANidine (ZANAFLEX) 2 MG tablet Take 1 tablet by mouth daily as needed (back muscle) Yes Dilnoor Clayborne Dana, MD   TRAZODONE HCL PO Take by mouth Yes Historical Provider, MD       Allergies   Allergen Reactions    Codeine      Anaphylaxis     Morphine          Subjective:      Review of Systems   Constitutional:  Positive for chills.   HENT:  Positive for congestion. Negative for ear pain and sore throat.    Eyes:  Negative for pain and redness.   Respiratory:  Positive for cough and chest tightness. Negative for shortness of breath.    Cardiovascular:  Negative for chest pain.   Gastrointestinal:  Negative for nausea and vomiting.   Genitourinary:  Negative for difficulty urinating.   Musculoskeletal:  Negative for gait problem.   Skin:  Negative for pallor.   Neurological:  Negative for light-headedness.       Objective:     Physical Exam  Vitals and nursing note reviewed.   Constitutional:       General: She is not in acute distress.     Appearance: Normal appearance.   HENT:      Head: Normocephalic and atraumatic.      Right Ear: Tympanic membrane and ear canal normal.      Left Ear: Tympanic membrane and ear canal normal.      Nose: Nose normal.      Mouth/Throat:      Lips: Pink.      Mouth: Mucous membranes are moist.      Pharynx: Oropharynx is clear. Uvula midline.   Eyes:      Extraocular Movements: Extraocular movements intact.      Conjunctiva/sclera: Conjunctivae normal.      Pupils: Pupils are equal, round, and reactive to light.   Cardiovascular:      Rate and Rhythm: Regular rhythm. Bradycardia present.      Pulses: Normal  pulses.   Pulmonary:      Effort: Pulmonary effort is normal. No tachypnea.      Breath sounds: Normal breath sounds.   Abdominal:      General: Bowel sounds are normal.      Palpations: Abdomen is soft.   Musculoskeletal:         General: Normal range of motion.      Cervical back: Normal range of motion and neck supple.   Skin:     General: Skin is warm and dry.      Capillary Refill: Capillary refill takes less than 2 seconds.      Coloration: Skin is not pale.   Neurological:      Mental Status: She is alert and oriented to person, place, and time.      Gait: Gait normal.   Psychiatric:         Mood and Affect: Mood normal.         Thought Content: Thought content normal.           MEDICAL DECISION MAKING Assessment/Plan:     Monica Wood was seen today for cough and chest congestion.    Diagnoses and all orders for this visit:    Bronchitis  -     albuterol sulfate HFA (PROVENTIL HFA) 108 (90 Base) MCG/ACT inhaler; Inhale 2 puffs into the lungs every 6 hours as needed for Wheezing or Shortness of Breath  -     azithromycin (ZITHROMAX) 250 MG tablet; Take 2 tablets (500 mg) on Day 1, followed by 1 tablet (250 mg) once daily on Days 2 through 5.  -     benzonatate (TESSALON PERLES) 100 MG capsule; Take 1 capsule by mouth 3 times daily as needed for Cough    Chest tightness  -     albuterol sulfate HFA (PROVENTIL HFA) 108 (90 Base) MCG/ACT inhaler; Inhale 2 puffs into the lungs every  6 hours as needed for Wheezing or Shortness of Breath        Results for orders placed or performed in visit on 02/19/22   Fecal DNA Colorectal cancer screening (Cologuard)    Specimen: Stool   Result Value Ref Range    FIT-DNA (Cologuard) Negative Negative     Based on the patient's history and exam will treat as bronchitis.   The patient does not have clinical findings suggestive of pneumonia.  There is no abnormal vital signs (pulse is not greater than 100/ minute, respirations are not greater than 24/ minute, temperature is not greater  than 38 degrees Celsius, or oxygen saturation less than 95 percent) There is no tachypnea, rales, or signs of parenchymal consolidation on exam. There are no changes in mental status or behavioral changes.    Pt to fill and take medications as prescribed.  Rest, increase fluids.  Return if no improvement in symptoms.  Go to the ER for any emergent concern.     Patient given educational materials - see patientinstructions.  Discussed use, benefit, and side effects of prescribed medications.  All patient questions answered. Pt verbalized understanding.  Instructed to continue current medications, diet and exercise.  Patient agreed with treatment plan. Follow up as directed.     Electronically signed by Bunnie Domino, APRN - CNP on 03/27/2022 at 9:10 AM

## 2022-04-21 ENCOUNTER — Encounter: Payer: PRIVATE HEALTH INSURANCE | Primary: Nurse Practitioner

## 2022-04-23 ENCOUNTER — Ambulatory Visit: Payer: PRIVATE HEALTH INSURANCE | Primary: Nurse Practitioner

## 2022-04-23 DIAGNOSIS — Z1231 Encounter for screening mammogram for malignant neoplasm of breast: Secondary | ICD-10-CM

## 2022-05-28 ENCOUNTER — Ambulatory Visit
Admit: 2022-05-28 | Discharge: 2022-05-28 | Payer: BLUE CROSS/BLUE SHIELD | Attending: Nurse Practitioner | Primary: Nurse Practitioner

## 2022-05-28 DIAGNOSIS — Z124 Encounter for screening for malignant neoplasm of cervix: Secondary | ICD-10-CM

## 2022-05-28 NOTE — Progress Notes (Signed)
[facility]  [address]  [address]  [place] [address]  Dept: [phone]  Dept Fax: [phone]    Monica Wood is a 46 y.o. female who presents today for her medical conditions/complaintsas noted below.  Monica Wood is c/o of Gynecologic Exam and Migraine (Fatigues, exhausted, RLQ pain )        HPI:     Patient presents for her Pap  Blood pressure stable  Weight is stable    Patient presents for her Pap  She also complains of a headache  Woke up Sunday with a terrible headache. She had dry heaving. Felt hungover. Hot and cold but no fever. She couldn't stay awake. She slept all day. No sinus sx.   Ruq pain. Feels like a knot. 2-3 weeks. When she eats it feels stuck. Started new meal plan. Unsure if this made it better or worse.   Rarely gets headches.this was unusual.  She has not had anything since then  Admits to not drinking a lot of water but has been trying to drink more lately.         Past Medical History:   Diagnosis Date    Bursitis     Fibromyalgia     Scoliosis     Tendinitis       Past Surgical History:   Procedure Laterality Date    ABDOMINOPLASTY      ESOPHAGOGASTRIC FUNDOPLICATION  2000    GALLBLADDER SURGERY      TUBAL LIGATION         Family History   Problem Relation Age of Onset    Diabetes Mother     Diabetes Maternal Grandmother        Social History     Tobacco Use    Smoking status: Never    Smokeless tobacco: Never   Substance Use Topics    Alcohol use: Yes     Comment: 3/4 times a week      Current Outpatient Medications   Medication Sig Dispense Refill    Viloxazine HCl ER (QELBREE) 100 MG CP24       albuterol sulfate HFA (PROVENTIL HFA) 108 (90 Base) MCG/ACT inhaler Inhale 2 puffs into the lungs every 6 hours as needed for Wheezing or Shortness of Breath 18 g 0    DULoxetine (CYMBALTA) 60 MG extended release capsule Take 1 capsule by mouth daily 90 capsule 1    DULoxetine (CYMBALTA) 30 MG extended release capsule Take 1  capsule by mouth daily 90 capsule 1    buPROPion (WELLBUTRIN XL) 150 MG extended release tablet Take 1 tablet by mouth every morning 90 tablet 1    tiZANidine (ZANAFLEX) 2 MG tablet Take 1 tablet by mouth daily as needed (back muscle) 30 tablet 1    TRAZODONE HCL PO Take by mouth       No current facility-administered medications for this visit.     Allergies   Allergen Reactions    Codeine      Anaphylaxis     Morphine        Health Maintenance   Topic Date Due    Hepatitis B vaccine (1 of 3 - 3-dose series) Never done    COVID-19 Vaccine (1) Never done    DTaP/Tdap/Td vaccine (1 - Tdap) Never done    Cervical cancer screen  Never done    Flu vaccine (1) Never done    Depression Monitoring  02/20/2023  Colorectal Cancer Screen  03/01/2025    Lipids  11/06/2025    Hepatitis A vaccine  Aged Out    Hib vaccine  Aged Out    HPV vaccine  Aged Out    Meningococcal (ACWY) vaccine  Aged Out    Pneumococcal 0-64 years Vaccine  Aged Out    Depression Screen  Discontinued    Diabetes screen  Discontinued    Hepatitis C screen  Discontinued    HIV screen  Discontinued       :     Review of Systems   Constitutional:  Negative for chills, fatigue and fever.   HENT:  Negative for ear discharge, ear pain, sinus pressure, sinus pain, sore throat and trouble swallowing.    Eyes:  Negative for discharge, redness and itching.   Respiratory:  Negative for cough, chest tightness, shortness of breath and wheezing.    Cardiovascular:  Negative for chest pain.   Gastrointestinal:  Positive for abdominal pain. Negative for diarrhea, nausea and vomiting.   Genitourinary:  Negative for difficulty urinating.   Musculoskeletal:  Negative for arthralgias and neck pain.   Skin:  Negative for rash.   Neurological:  Positive for headaches. Negative for dizziness, weakness and light-headedness.   All other systems reviewed and are negative.      Objective:     Physical Exam  Constitutional:       General: She is not in acute distress.      Appearance: Normal appearance. She is normal weight. She is not ill-appearing.   HENT:      Head: Normocephalic and atraumatic.      Nose: Nose normal. No congestion or rhinorrhea.      Mouth/Throat:      Mouth: Mucous membranes are moist.      Pharynx: Oropharynx is clear. No oropharyngeal exudate or posterior oropharyngeal erythema.   Eyes:      Extraocular Movements: Extraocular movements intact.      Conjunctiva/sclera: Conjunctivae normal.      Pupils: Pupils are equal, round, and reactive to light.   Cardiovascular:      Rate and Rhythm: Normal rate and regular rhythm.      Pulses: Normal pulses.      Heart sounds: Normal heart sounds. No murmur heard.  Pulmonary:      Effort: Pulmonary effort is normal. No respiratory distress.      Breath sounds: Normal breath sounds. No wheezing.   Chest:   Breasts:     Right: Normal. No inverted nipple.      Left: Normal. No inverted nipple.   Abdominal:      General: Abdomen is flat. Bowel sounds are normal. There is no distension.      Palpations: Abdomen is soft.      Tenderness: There is no abdominal tenderness. There is no guarding or rebound.      Hernia: No hernia is present.   Genitourinary:     Exam position: Supine.      Cervix: Normal.      Uterus: Normal.       Adnexa: Right adnexa normal and left adnexa normal.   Musculoskeletal:         General: Normal range of motion.      Cervical back: Normal range of motion and neck supple.   Skin:     General: Skin is warm and dry.      Capillary Refill: Capillary refill takes less than 2 seconds.   Neurological:  General: No focal deficit present.      Mental Status: She is alert and oriented to person, place, and time.      Motor: No weakness.      Coordination: Coordination normal.      Gait: Gait normal.   Psychiatric:         Mood and Affect: Mood normal.         Behavior: Behavior normal.         Thought Content: Thought content normal.       BP 120/80   Pulse 91   Wt 96.6 kg (213 lb)   SpO2 98%   BMI 36.56  kg/m     Assessment:       Diagnosis Orders   1. Cervical cancer screening  PAP Smear      2. Acute nonintractable headache, unspecified headache type        3. RUQ pain            :      Return in about 5 months (around 10/27/2022) for follow up.  1.  Cervical cancer screening  Pap completed  2.  Headache  Patient does not typically get headache.  We discussed the importance of hydration.  Headache may be caused from dehydration.  She is going to try to increase her water intake she is going to let me know if this happens again  3.  Right upper quadrant pain  Patient no longer has a gallbladder.  We also discussed the importance of increasing her water intake.  This may be related to constipation    Patient going to follow-up in 4 months or sooner as needed  Orders Placed This Encounter   Procedures    PAP Smear     Patient History:    No LMP recorded. Patient has had an ablation.  OBGYN Status: Ablation  Past Surgical History:  No date: ABDOMINOPLASTY  2000: ESOPHAGOGASTRIC FUNDOPLICATION  No date: GALLBLADDER SURGERY  No date: TUBAL LIGATION      Social History    Tobacco Use      Smoking status: Never      Smokeless tobacco: Never       Standing Status:   Future     Standing Expiration Date:   05/29/2023     Order Specific Question:   Collection Type     Answer:   Thin Prep     Order Specific Question:   Prior Abnormal Pap Test     Answer:   No     Order Specific Question:   Screening or Diagnostic     Answer:   Screening     Order Specific Question:   HPV Requested?     Answer:   Yes - If Abnormal Reflex HPV     Order Specific Question:   High Risk Patient     Answer:   N/A     No orders of the defined types were placed in this encounter.      Patient given educational materials - seepatient instructions.  Discussed use, benefit, and side effects of prescribed medications.All patient questions answered.  Pt voiced understanding. Reviewed health maintenance.Instructed to continue current medications, diet and  exercise.  Patient agreedwith treatment plan. Follow up as directed.      Electronically signed by Brion Aliment, APRN - CNP on 11/15/2023at 12:59 PM

## 2022-06-08 ENCOUNTER — Inpatient Hospital Stay
Admit: 2022-06-08 | Discharge: 2022-06-09 | Disposition: A | Discharge status: Home / Self Care | Payer: BLUE CROSS/BLUE SHIELD

## 2022-06-08 DIAGNOSIS — R519 Headache, unspecified: Secondary | ICD-10-CM

## 2022-06-08 NOTE — ED Provider Notes (Signed)
[facility] Emergency Department  [id number] [facility] [address]  [facility] [place] [address]  Phone: [phone]  Fax: [phone]  EMERGENCY DEPARTMENT ENCOUNTER      Pt Name: Monica Wood  MRN: [id number]  Birthdate 09/26/1975  Date of evaluation: 06/08/2022    CHIEF COMPLAINT       Chief Complaint   Patient presents with    Headache    Dizziness    Fatigue       HISTORY OF PRESENT ILLNESS    Monica Wood is a 46 y.o. female who presents to the emergency department headache, weakness, dizziness and fatigue.  Patient states symptoms have been intermittent for 3 weeks.  She says she gets the headache and it subsides.  Headache is now her acute sudden onset and is not the worst headache of her life time.  She states she is now having fatigue, myalgias, had a runny nose.  She denies any blurred vision, or diplopia.  She denies any nausea, vomiting, diarrhea, constipation, or abdominal pain.  She states in the past she has had some headaches that would cause her nausea but she is currently not nauseated.  She denies any fever, chills, or cough.  She denies any chest pain, shortness of breath.  She denies any paresthesias, or focal weakness.  She denies any fall or trauma.  She denies any neck pain.  She denies any palpitations.  Patient denies any photophobia, or phonophobia.  She has not taking anything at home.    REVIEW OF SYSTEMS     Review of Systems   All other systems reviewed and are negative.      PAST MEDICAL HISTORY    has a past medical history of Bursitis, Fibromyalgia, Scoliosis, and Tendinitis.    SURGICAL HISTORY      has a past surgical history that includes Gallbladder surgery; Abdominoplasty; Tubal ligation; and Esophagogastric fundoplication (2000).    CURRENT MEDICATIONS       Discharge Medication List as of 06/08/2022 10:11 PM        CONTINUE these medications which have NOT CHANGED    Details   Viloxazine HCl ER (QELBREE) 100 MG CP24 Historical Med      albuterol sulfate HFA (PROVENTIL HFA) 108  (90 Base) MCG/ACT inhaler Inhale 2 puffs into the lungs every 6 hours as needed for Wheezing or Shortness of Breath, Disp-18 g, R-0Normal      !! DULoxetine (CYMBALTA) 60 MG extended release capsule Take 1 capsule by mouth daily, Disp-90 capsule, R-1Total 90 mg a dayNormal      !! DULoxetine (CYMBALTA) 30 MG extended release capsule Take 1 capsule by mouth daily, Disp-90 capsule, R-1Total 90 mgNormal      buPROPion (WELLBUTRIN XL) 150 MG extended release tablet Take 1 tablet by mouth every morning, Disp-90 tablet, R-1Normal      tiZANidine (ZANAFLEX) 2 MG tablet Take 1 tablet by mouth daily as needed (back muscle), Disp-30 tablet, R-1Normal      TRAZODONE HCL PO Take by mouthHistorical Med       !! - Potential duplicate medications found. Please discuss with provider.          ALLERGIES     is allergic to codeine and morphine.    FAMILY HISTORY     She indicated that the status of her mother is unknown. She indicated that the status of her maternal grandmother is unknown.     family history includes Diabetes in her maternal grandmother and mother.  SOCIAL HISTORY      reports that she has never smoked. She has never used smokeless tobacco. She reports current alcohol use. She reports that she does not use drugs.    PHYSICAL EXAM       ED Triage Vitals [06/08/22 1839]   BP Temp Temp Source Pulse Respirations SpO2 Height Weight - Scale   130/70 98.4 F (36.9 C) Temporal 70 14 97 % 1.626 m (5\' 4" ) 95.3 kg (210 lb)     Constitutional: Alert, oriented x3, nontoxic, answering questions appropriately, acting properly for age, in no acute distress   HEENT: Extraocular muscles intact, mucus membranes moist, TMs clear bilaterally, no posterior pharyngeal erythema or exudates, Pupils equal, round, reactive to light,   Neck: Trachea midline   Cardiovascular: Regular rhythm and rate no appreciable murmur  Respiratory: Clear to auscultation bilaterally no wheezes, rhonchi, rales, no respiratory distress no tachypnea no  retractions no hypoxia  Gastrointestinal: Soft, nontender, nondistended, positive bowel sounds.  No rebound, rigidity, or guarding.   Musculoskeletal: No extremity pain or swelling   Neurologic: Moving all 4 extremities without difficulty there are no gross focal neurologic deficits   Skin: Warm and dry         DIFFERENTIAL DIAGNOSIS/ MDM:         Differentials Considered but not limited to the following: Headache: Tension headache, migraine headache, cluster headache, sinusitis. Intracranial pathologies such as brain tumor, SAH, encephalitis, meningitis.                      Chronic Conditions affecting care (DM,HTN,CA, etc):  see past medical history above    Social Determinants of Health affecting care (unable to care for self, lives alone, unemployed, homeless,etc): lives at home    History source(s) (patient,spouse,parent,family,friend,EMS,etc): patient    Review of external sources (ECF,Hospital records,EMS report, radiology reports, etc): Hospital records    Tests considered but not ordered: See below    Independent interpretation of tests (eg.  X-ray, CAT scan, Doppler studies, EKG): See below    Discussion of x-ray results with radiology: See below    Consults: See below    Consideration for admission/observation (even if discharged): considered admission, final decision will be based on test results and patient status    Prescription considerations: See below    Critical Care note written: See below    Sepsis considered: Considered, no criteria    DIAGNOSTIC RESULTS     EKG: All EKG's are interpreted by the Emergency Department Physician who either signs or Co-signs this chart in the absence of a cardiologist.        Not indicated unless otherwise documented above    LABS:  Results for orders placed or performed during the hospital encounter of 06/08/22   Rapid Influenza A/B Antigens    Specimen: Nasopharyngeal   Result Value Ref Range    Flu A Antigen NEGATIVE NEGATIVE    Flu B Antigen NEGATIVE NEGATIVE    COVID-19, Rapid    Specimen: Nasopharyngeal Swab   Result Value Ref Range    Specimen Description .NASOPHARYNGEAL SWAB     SARS-CoV-2, Rapid Not Detected Not Detected       Not indicated unless otherwise documented above    RADIOLOGY:   I reviewed the radiologist interpretations:    No orders to display       Not indicated unless otherwise documented above    EMERGENCY DEPARTMENT COURSE:     The patient was given the  following medications:  Orders Placed This Encounter   Medications    sodium chloride 0.9 % bolus 1,000 mL    ondansetron (ZOFRAN) injection 4 mg    diphenhydrAMINE (BENADRYL) injection 25 mg    ketorolac (TORADOL) injection 30 mg        Vitals:   -------------------------  BP 130/70   Pulse 70   Temp 98.4 F (36.9 C) (Temporal)   Resp 14   Ht 1.626 m (5\' 4" )   Wt 95.3 kg (210 lb)   SpO2 97%   BMI 36.05 kg/m     Patient's history and physical exam are more consistent with migraine type headache vs viral syndrome.  Discussed with the patient different types of treatment including oral antiemetics, injections and IV hydration which she states she does feel dehydrated and it would be best if she has some IV fluids.  She had an IV established and was given 1 L of normal saline, 4 mg of Zofran, 25 mg of Benadryl and 30 mg of Toradol.  Patient's symptoms improved she stated she felt much better.  She is given a work note.  She will follow-up with primary care doctor.      CRITICAL CARE:    None    PROCEDURES:    None      OARRS Report if indicated           The patient and spouse understands that at this time there is no evidence for a more malignant underlying process, but also understands that early in the process of an illness or injury, an emergency department workup can be falsely reassuring.  Routine discharge counseling was given, and it is understood that worsening, changing or persistent symptoms should prompt an immediate call or follow up with their primary physician or return to the  emergency department. The importance of appropriate follow up was also discussed.  I have reviewed the disposition diagnosis.  I have answered the questions and given discharge instructions.  There was voiced understanding of these instructions and no further questions or complaints.    FINAL IMPRESSION      1. Nonintractable headache, unspecified chronicity pattern, unspecified headache type          DISPOSITION/PLAN   DISPOSITION Decision To Discharge 06/08/2022 10:10:56 PM        CONDITION ON DISPOSITION: STABLE       PATIENT REFERRED TO:  06/10/2022, APRN - CNP  [address]  [address]  [facility]  [phone]    Call in 1 day  As needed      DISCHARGE MEDICATIONS:  Discharge Medication List as of 06/08/2022 10:11 PM          (Please note that portions of this note were completed with a voice recognition program.  Efforts were made to edit the dictations but occasionally words are mis-transcribed.  Additionally, portions of this note may also include information that was incorporated after care transfer to another provider that were not available at the time of my evaluation.  Some of this information could likely include laboratory values, vital sign updates, medications etc.)    06/10/2022, DO   Attending Emergency Physician       Lorelle Gibbs, DO  06/09/22 2318

## 2022-06-09 LAB — RAPID INFLUENZA A/B ANTIGENS
Flu A Antigen: NEGATIVE
Flu B Antigen: NEGATIVE

## 2022-06-09 LAB — COVID-19, RAPID: SARS-CoV-2, Rapid: NOT DETECTED

## 2022-06-09 MED ORDER — DIPHENHYDRAMINE HCL 50 MG/ML IJ SOLN
50 MG/ML | Freq: Once | INTRAMUSCULAR | Status: AC
Start: 2022-06-09 — End: 2022-06-08
  Administered 2022-06-09: 02:00:00 25 mg via INTRAVENOUS

## 2022-06-09 MED ORDER — KETOROLAC TROMETHAMINE 30 MG/ML IJ SOLN
30 MG/ML | Freq: Once | INTRAMUSCULAR | Status: AC
Start: 2022-06-09 — End: 2022-06-08
  Administered 2022-06-09: 02:00:00 30 mg via INTRAVENOUS

## 2022-06-09 MED ORDER — ONDANSETRON HCL 4 MG/2ML IJ SOLN
4 MG/2ML | Freq: Once | INTRAMUSCULAR | Status: AC
Start: 2022-06-09 — End: 2022-06-08
  Administered 2022-06-09: 02:00:00 4 mg via INTRAVENOUS

## 2022-06-09 MED ORDER — SODIUM CHLORIDE 0.9 % IV BOLUS
0.9 % | Freq: Once | INTRAVENOUS | Status: AC
Start: 2022-06-09 — End: 2022-06-08
  Administered 2022-06-09: 02:00:00 1000 mL via INTRAVENOUS

## 2022-06-09 MED FILL — DIPHENHYDRAMINE HCL 50 MG/ML IJ SOLN: 50 MG/ML | INTRAMUSCULAR | Qty: 1

## 2022-06-09 MED FILL — ONDANSETRON HCL 4 MG/2ML IJ SOLN: 4 MG/2ML | INTRAMUSCULAR | Qty: 2

## 2022-06-09 MED FILL — KETOROLAC TROMETHAMINE 30 MG/ML IJ SOLN: 30 MG/ML | INTRAMUSCULAR | Qty: 1

## 2022-06-14 LAB — GYN CYTOLOGY

## 2022-07-28 NOTE — Telephone Encounter (Signed)
Patient called stating she would like a referral to pain management.    Patient stated she is having really bad pain from the top of her head to her lower calf.

## 2022-07-28 NOTE — Telephone Encounter (Signed)
Ok please have her make an appointment to discuss

## 2022-07-28 NOTE — Telephone Encounter (Signed)
Pt informed, made appt

## 2022-08-04 ENCOUNTER — Encounter: Payer: PRIVATE HEALTH INSURANCE | Attending: Nurse Practitioner | Primary: Nurse Practitioner

## 2022-08-05 ENCOUNTER — Telehealth
Admit: 2022-08-05 | Discharge: 2022-08-05 | Payer: PRIVATE HEALTH INSURANCE | Attending: Nurse Practitioner | Primary: Nurse Practitioner

## 2022-08-05 DIAGNOSIS — R1011 Right upper quadrant pain: Secondary | ICD-10-CM

## 2022-08-05 NOTE — Progress Notes (Signed)
08/05/2022    TELEHEALTH EVALUATION -- Audio/Visual    HPI:    Monica Wood (DOB:  [birth year]) has requested an audio/video evaluation for the following concern(s):    Pt presents for a VV  Still having issues with headaches. Start in back of head. Goes into neck, into arm and leg. All on right side. Full ROM. C/o weakness of right hand.   Everytime she eats, she has RUQ pain. She has a pressure and sometimes pain if she eats. More pain if greasy food. She did have gallstones. She did have surgery for her acid reflux. She was 21 when she had this surgery. [place]    She also has a weak pelvic floor.  She would like to go to pelvic floor therapy    She does have some sort of bm everyday    Headaches maybe related to stress. She did try myofascial release which was helpful. Will need referral     Review of Systems   Constitutional:  Negative for chills, fatigue and fever.   HENT:  Negative for ear discharge, ear pain, sinus pressure, sinus pain, sore throat and trouble swallowing.    Eyes:  Negative for discharge, redness and itching.   Respiratory:  Negative for cough, chest tightness, shortness of breath and wheezing.    Cardiovascular:  Negative for chest pain.   Gastrointestinal:  Positive for abdominal pain. Negative for diarrhea, nausea and vomiting.   Genitourinary:  Negative for difficulty urinating.   Musculoskeletal:  Positive for back pain and neck pain. Negative for arthralgias.   Skin:  Negative for rash.   Neurological:  Positive for headaches. Negative for dizziness, weakness and light-headedness.   All other systems reviewed and are negative.      Prior to Visit Medications    Medication Sig Taking? Authorizing Provider   Viloxazine HCl ER (QELBREE) 100 MG CP24   [provider]   albuterol sulfate HFA (PROVENTIL HFA) 108 (90 Base) MCG/ACT inhaler Inhale 2 puffs into the lungs every 6 hours as needed for Wheezing or Shortness of Breath  Bunnie Domino, APRN - CNP   DULoxetine  (CYMBALTA) 60 MG extended release capsule Take 1 capsule by mouth daily  Levander Campion, APRN - CNP   DULoxetine (CYMBALTA) 30 MG extended release capsule Take 1 capsule by mouth daily  Levander Campion, APRN - CNP   buPROPion (WELLBUTRIN XL) 150 MG extended release tablet Take 1 tablet by mouth every morning  Levander Campion, APRN - CNP   tiZANidine (ZANAFLEX) 2 MG tablet Take 1 tablet by mouth daily as needed (back muscle)  Clayborne Dana, [place], MD   TRAZODONE HCL PO Take by mouth  [provider]       Social History     Tobacco Use    Smoking status: Never    Smokeless tobacco: Never   Substance Use Topics    Alcohol use: Yes     Comment: 3/4 times a week    Drug use: Never        Allergies   Allergen Reactions    Parsley Leaves Anaphylaxis, Hives, Itching and Shortness Of Breath    Codeine      Anaphylaxis     Morphine    ,   Past Medical History:   Diagnosis Date    Bursitis     Fibromyalgia     Scoliosis     Tendinitis    ,   Past Surgical History:  Procedure Laterality Date    ABDOMINOPLASTY      ESOPHAGOGASTRIC FUNDOPLICATION  1610    GALLBLADDER SURGERY      TUBAL LIGATION     ,   Social History     Tobacco Use    Smoking status: Never    Smokeless tobacco: Never   Substance Use Topics    Alcohol use: Yes     Comment: 3/4 times a week    Drug use: Never   ,   Family History   Problem Relation Age of Onset    Diabetes Mother     Diabetes Maternal Grandmother    ,   There is no immunization history on file for this patient.,   Health Maintenance   Topic Date Due    Hepatitis B vaccine (1 of 3 - 3-dose series) Never done    COVID-19 Vaccine (1) Never done    DTaP/Tdap/Td vaccine (1 - Tdap) Never done    Flu vaccine (1) Never done    Depression Monitoring  08/06/2023    Colorectal Cancer Screen  03/01/2025    Cervical cancer screen  05/28/2025    Lipids  11/06/2025    Hepatitis A vaccine  Aged Out    Hib vaccine  Aged Out    HPV vaccine  Aged Out    Polio vaccine  Aged Out    Meningococcal (ACWY)  vaccine  Aged Out    Pneumococcal 0-64 years Vaccine  Aged Out    Depression Screen  Discontinued    Diabetes screen  Discontinued    Hepatitis C screen  Discontinued    HIV screen  Discontinued       PHYSICAL EXAMINATION:  [ INSTRUCTIONS:  "[x] " Indicates a positive item  "[] " Indicates a negative item  -- DELETE ALL ITEMS NOT EXAMINED]  Vital Signs: (As obtained by patient/caregiver or practitioner observation)    Constitutional: [x]  Appears well-developed and well-nourished [x]  No apparent distress      []  Abnormal-   Mental status  [x]  Alert and awake  [x]  Oriented to person/place/time [x] Able to follow commands      Eyes:  EOM    [x]   Normal  []  Abnormal-  Sclera  []   Normal  []  Abnormal -         Discharge []   None visible  []  Abnormal -    HENT:   [x]  Normocephalic, atraumatic.  []  Abnormal   [x]  Mouth/Throat: Mucous membranes are moist.     External Ears [x]  Normal  []  Abnormal-     Neck: [x]  No visualized mass     Pulmonary/Chest: [x]  Respiratory effort normal.  [x]  No visualized signs of difficulty breathing or respiratory distress        []  Abnormal-      Musculoskeletal:   []  Normal gait with no signs of ataxia         [x]  Normal range of motion of neck        []  Abnormal-       Neurological:        [x]  No Facial Asymmetry (Cranial nerve 7 motor function) (limited exam to video visit)          []  No gaze palsy        []  Abnormal-         Skin:        [x]  No significant exanthematous lesions or discoloration noted on facial skin         []   Abnormal-            Psychiatric:       [x]  Normal Affect []  No Hallucinations        []  Abnormal-      ASSESSMENT/PLAN:  1. RUQ pain  History of gallstones.  Will get MRCP to rule out any abnormalities of the bile duct before referral  - MRI ABDOMEN W WO CONTRAST MRCP; Future    2. Cervicalgia  May be related to stress.  Referral to physical therapy  - [facility] [place]    3. Pelvic floor weakness  Referral to pelvic floor therapy  - [facility]    F/u in April as scheduled or sooner as needed   No follow-ups on file.    Aurelio Brash, was evaluated through a synchronous (real-time) audio-video encounter. The patient (or guardian if applicable) is aware that this is a billable service, which includes applicable co-pays. This Virtual Visit was conducted with patient's (and/or legal guardian's) consent. Patient identification was verified, and a caregiver was present when appropriate.   The patient was located at Home: [address]  [place] [address]  Provider was located at Home ([place]): [facility]        Total time spent on this encounter: Not billed by time    --Brion Aliment, APRN - CNP on 08/05/2022 at 11:44 AM    An electronic signature was used to authenticate this note.

## 2022-08-14 ENCOUNTER — Inpatient Hospital Stay: Admit: 2022-08-14 | Payer: PRIVATE HEALTH INSURANCE | Primary: Nurse Practitioner

## 2022-08-14 DIAGNOSIS — R1011 Right upper quadrant pain: Secondary | ICD-10-CM

## 2022-08-14 MED ORDER — NORMAL SALINE FLUSH 0.9 % IV SOLN
0.9 % | INTRAVENOUS | Status: DC | PRN
Start: 2022-08-14 — End: 2022-08-17
  Administered 2022-08-14: 14:00:00 10 mL via INTRAVENOUS

## 2022-08-14 MED ORDER — SODIUM CHLORIDE 0.9 % IV BOLUS
0.9 % | Freq: Once | INTRAVENOUS | Status: AC
Start: 2022-08-14 — End: 2022-08-14
  Administered 2022-08-14: 14:00:00 40 mL via INTRAVENOUS

## 2022-08-14 MED ORDER — GADOTERIDOL 279.3 MG/ML IV SOLN
279.3 MG/ML | Freq: Once | INTRAVENOUS | Status: AC | PRN
Start: 2022-08-14 — End: 2022-08-14
  Administered 2022-08-14: 14:00:00 20 mL via INTRAVENOUS

## 2022-08-16 NOTE — Telephone Encounter (Signed)
Last Visit Date: 08/05/2022  Next Visit Date: 10/29/2022

## 2022-08-17 MED ORDER — DULOXETINE HCL 60 MG PO CPEP
60 MG | ORAL_CAPSULE | Freq: Every day | ORAL | 1 refills | Status: AC
Start: 2022-08-17 — End: ?

## 2022-08-18 ENCOUNTER — Inpatient Hospital Stay: Admit: 2022-08-18 | Discharge: 2022-08-19 | Payer: PRIVATE HEALTH INSURANCE | Primary: Nurse Practitioner

## 2022-08-18 DIAGNOSIS — M542 Cervicalgia: Secondary | ICD-10-CM

## 2022-08-18 NOTE — Consults (Signed)
[]  [facility] &  Therapy  [address]  [phone]  [phone] []  [address]  P: [phone]  F: [phone] [x]  [place]  [address] [place]: [phone]  F: [phone] []  [place]  [phone]  [phone] []  [place]  [address]   [place]: [phone]  F: [phone]  []  [place]  P: [phone]  F: [phone] []  [place]  [address]  P: [phone]  F: [phone] []  [place]  [address]  [place]: [phone]  F: [phone] []  [place]  [address]  [place]: [phone]  F: [phone]  []  [place]  [address]  [place]: [phone]  F: [phone]     Physical Therapy Spine Evaluation    Date:  08/18/2022  Patient: Monica Wood  DOB: 15-Sep-1975  MRN: [id number]  Physician: Brion Aliment, APRN - CNP  Insurance: [facility] after 30 visits  Medical Diagnosis: Cervicalgia   Rehab Codes: M54.2,  R29.3, M99.01     Onset Date: 08/05/22 script    Next Dr.'s appt.: 10/29/22    Subjective:   CC: Pt c/o neck pain and tension from head/neck to R lower extremity. States pain starts top of head R side and radiates down into neck, R shoulder and occ R LE.  Occ HA's mostly w/ lifting weight or "if I get worked up," notes always same spot top of head.  Notes occ R arm numbness down arm to ring and pinkie  finger and weakness esp w/ opening and turning-notes she uses her right arm a lot despite being L handed. Occ nausea w/ incr pain.  Sleeps w/ arm straight out to side as incr pain w/ arm/elbow bent.   HPI: (onset date) Pt w/ long Hx of neck pain.     PMHx: []  Unremarkable []  Diabetes []  HTN  []  Pacemaker   []  MI/Heart Problems []  Cancer [x]  Arthritis- OA neck and back [x]  Other:fibro, tendonitis shoulders, bursitis B hips               [x]  Refer to full medical chart  In EPIC       Comorbidities:   []  Obesity []  Dialysis  []  N/A   []  Asthma/COPD []  Dementia [x]  Other: depression   []  Stroke []  Sleep apnea []  Other:   []  Vascular disease []  Rheumatic disease []  Other:     Tests: []  X-Ray: []  MRI:  [x]  Other: CT 05/31/22 Multilevel degenerative changes     Medications: [x]  Refer to full medical record []  None []  Other:  Allergies:      [x]  Refer to full medical record []  None []  Other:    Function:  Hand Dominance  [x]  Right  [x]  Left*  Patient lives with: Husband, son  Employer [employer]   Job Status [x]   Normal duty   []  Light duty   []  Off due to condition    []   Retired   []  Not employed   []  Disability  []  Other:  []   Return to work:   Work [employer] cabin crew, desk work        ADL/IADL [x]  Previously independent with all [x]  Currently independent with all Who currently assists the patient with task     []  Previously independent with all except: []  Currently independent with all except:     Bathing  []  Assist []  Assist     Dress/grooming []  Assist []  Assist     Transfer/mobility []  Assist []  Assist     Feeding []  Assist []  Assist     Toileting []  Assist []  Assist     Driving []  Assist []  Assist     Housekeeping []  Assist []  Assist     Grocery shop/meal prep []  Assist []  Assist            Gait Prior level of function Current level of function    [x]  Independent  []  Assist [x]  Independent  []  Assist   Device: [x]  Independent [x]  Independent     Pain:  [x]  Yes  []  No Location: neck, head,  R UE Pain Rating: (0-10 scale) 3-8/10  Pain altered Tx:  []  Yes  [x]  No  Action:    Symptoms:  []  Improving [x]  Worsening []  Same    Sleep: []  OK    [x]  Disturbed- hard time getting comfortable    Objective:      STRENGTH ROM  ROM    Left Right Left Right Cervical Cerv ROM  WFL tight at  end range    C5 Shld Abd 5 5 164 164 Flexion    Shld Flexion 5 5 161 161 Extension    Shld IR 5 5 T12 T12 Rotation L R   Shld ER 5 5 85 87 Sidebend L R   C6 Elb Flex 5 5   Retraction    C7 Elb Ext 5 5       C8 EPL         T1 Fing Abd         Grip *54.8, 52, 44.6 43.2, 42.8, 47.6          TESTS (+/-) LEFT RIGHT Not Tested   Joint Mobility Decr cerv Decr cerv []    Cerv. Comp Feels good Feels good []    Cerv. Distraction No change No change []    Cerv. Alar/Transverse   []    Vertebral Artery   []    Adson's   []    Lorin Zapata - - []        OBSERVATION No Deficit Deficit Not Tested Comments   Posture       Forward Head []  [x]  []  +upper crossed syndrome   Rounded Shoulders []  [x]  []     Kyphosis []  []  []     Lordosis []  []  []     Lateral Shift []  []  []     Slumped Sitting []  [x]  []     Palpation []  [x]  []  Sig muscular and fascial tightness throughout cervical paraspinals and suboccipitals as well as R upper quarter. Multiple TP's R forearm, bicep and shoulder girdle- TTP   Sensation []  [x]  []  N&T R UE ulnar N distribution   Edema []  []  []     Neurological []  []  []         Functional Test: NDI Score: 30%  functionally impaired       Assessment:  Patient presents with signs and symptoms consistent with referred diagnosis. Pt w/ cervical paraspinal and shoulder tightness, as well as weakness in bilat scapular retractors and postural muscles. Patient would benefit from skilled physical therapy services in order to: Decrease tightness in cervical paraspinals, suboccipitals am pecs and strengthen scapular stabilizers in order to improve overall postural stabilization and decrease strain on head and neck.      Problem list, as detailed above:   []  ? Back  Pain:    [x]  ? Cervical Pain, HA's   [x]  ? ROM: cervical spine musculature, R upper quarter tightness    [x]  ? Strength: Scapular, postural stability  [x]  ? Function: dist sleep, difficulty w/ reading, driving  [x]  Postural Deviations  []  Gait Deviations  []  Other:     STG: (to be met in 6 treatments)  ? Pain: Decrease pain levels to 2/10 at worst in neck and shoulder  ? ROM: Optimize AROM of cervical spine and bilat UE's  ? Strength: Bilat shoulders grossly 5/5, demonstrate improvements in scapular/postural stability   ? Function: Pt to report improved sleep and incr ease w/ ADL's, lifting, reading and driving  Patient to be independent with home exercise program as demonstrated by performance with correct form without cues.     LTG: (to be met in 12 treatments)  Pt to report decr frequency and intensity of HA's and UE N&T  2.  Improve score on assessment tools from 30% impaired to 25% impaired, demonstrating an improvement in overall function    Pt goals: no more pain/numbness also, strengthen grip in hand      Rehab Potential:  [x]  Good  []  Fair  []  Poor   Suggested Professional Referral:  [x]  No  []  Yes:  Barriers to Goal Achievement:  [x]  No  []  Yes:  Domestic Concerns:  [x]  No  []  Yes:    Pt. Education:  [x]  Plans/Goals, Risks/Benefits discussed  [x]  Home exercise program  Method of Education: [x]  Verbal  [x]  Demo  []  Written  Comprehension of Education:  [x]  Verbalizes understanding.  []  Demonstrates understanding.  []  Needs Review.  []  Demonstrates/verbalizes understanding of HEP/Ed previously given.    Treatment Plan:  [x]  Therapeutic Exercise   97110  []  Iontophoresis: 4 mg/mL Dexamethasone Sodium Phosphate 40-120 mAmin    [x]  Therapeutic Activity  97530 []  Vasopneumatic cold with compression  97016    []  Gait Training   97116 []  Ultrasound   97035   [x]  Neuromuscular Re-education  97112 [x]  Electrical Stimulation Unattended  97014   [x]  Manual Therapy  97140 []  Electrical Stimulation Attended  97032    [x]  Instruction in HEP  [x]  Lumbar/Cervical Traction  97012   []  Aquatic Therapy   97113 []  Cold/hotpack    []  Massage   97124      [x]  Dry Needling, 1 or 2 muscles  20560   []  Biofeedback, first 15 minutes   90912  []  Biofeedback, additional 15 minutes   90913 []  Dry Needling, 3 or more muscles  20561       Frequency:  1-2 x/week for 12 visits    Today's Treatment:  Modalities: prn  Precautions:  Manual: cerv down glides, manual traction, SOR, passive stretches UT, lev, cerv rot. MFR UT, lev, scm. Add MFR/CST thoracic inlet, respiratory diaphragm, DN?    Exercises:  Exercise Reps/ Time Weight/ Level Comments   Cerv rot x  UT, lev stretch x           Post shld rolls x     Scap retraction x     Other: Briefly rev ex from prev PT and encouraged pt to resume. Emphasized postural awareness esp w/ job duties     Specific Instructions for next treatment: cont w/ manual and progress as tol      Evaluation Complexity:  History (Personal factors, comorbidities) []  0 [x]  1-2 []  3+   Exam (limitations, restrictions) [x]  1-2 []  3 []  4+   Clinical presentation (progression) [x]  Stable []  Evolving  []  Unstable   Decision Making [x]  Low []  Moderate []  High    [x]  Low Complexity []  Moderate Complexity []  High Complexity       Treatment Charges: Mins Units   [x]  Evaluation       [x]   Low       []   Moderate       []   High 20 1   []   Modalities     []   Ther Exercise     [x]   Manual Therapy 30 2   []   Ther Activities     []   Aquatics     []   Vasocompression     []   Other       TOTAL BILLABLE TIME: 50    Time in: 6:00 pm      Time out: 6:59 pm    Electronically signed by: Karie Schwalbe, PT        Physician Signature:________________________________Date:__________________  By signing above or cosigning this note, I have reviewed this plan of care and certify a need for medically necessary rehabilitation services.     *PLEASE SIGN ABOVE AND FAX BACK ALL PAGES*

## 2022-08-20 ENCOUNTER — Encounter

## 2022-08-21 NOTE — Telephone Encounter (Signed)
Attempt 1 - LVM for GI appt  Attempt 2 - sent letter

## 2022-09-02 ENCOUNTER — Inpatient Hospital Stay: Admit: 2022-09-02 | Discharge: 2022-09-03 | Payer: PRIVATE HEALTH INSURANCE | Primary: Nurse Practitioner

## 2022-09-02 NOTE — Other (Signed)
$[]b$  [facility] &  Therapy  [address]  [phone]  [phone] []$  [place]  [address]  P: [phone]  F: [phone] [x]$  [facility] &  Therapy  [address] [place]: [phone]  F: [phone] []$  [place]  [phone]  [phone] []$  [place]  [address]   [place]: [phone]  F: [phone]  []$  [place]  P: [phone]  F: [phone] []$  [place]  [place]  P: [phone]  F: [phone] []$  [place]  [address]  [place]: [phone]  F: [phone] []$  [place]  [address]  [place]: [phone]  F: [phone]  []$  [place]  [address]  [place]: [phone]  F: [phone]     Physical Therapy Daily Treatment Note    Date:  09/02/2022  Patient Name:  Monica Wood    DOB:  January 22, 1976  MRN: [id number]  Physician: Brion Aliment, APRN - CNP  Insurance: [facility] [place] [facility] [place] after 30 visits  Medical Diagnosis: Cervicalgia                    Rehab Codes: M54.2,  R29.3, M99.01     Onset Date: 08/05/22 script                                        Next Dr.'s appt.: 10/29/22  Visit# / total visits: 2/12     Cancels/No Shows: 0    Subjective:  Pt states she had MRI, hasn't f/u w/ Dr yet but to see GI Dr April 15. Notes she has resumed her enzymes and feels helpful. Reports neck and shoulders are tight today. Changed pillow at home  but still not sleeping good "I roll around like a rotisserie chicken." States has been more compliant w/ HEP.  Pain:  [x]$  Yes  []$  No Location: neck, head, R UE  Pain Rating: (0-10 scale) 1/10  Pain altered Tx:  [x]$  No  []$  Yes  Action:  Comments:    Objective:  Modalities:   Precautions:  Manual: cerv down glides, manual traction, SOR, passive stretches UT, lev, cerv rot. MFR UT, lev, scm. MFR/CST thoracic inlet, respiratory diaphragm, R UE and cervical positional release.     Exercises:  Exercise Reps/ Time Weight/ Level Comments   Cerv rot x       UT, lev stretch x                 Post shld rolls x       Scap retraction x                            Other: add DN?  Treatment Charges: Mins Units   []$   Modalities     []$   Ther Exercise     [x]$   Manual Therapy 45 3   []$   Ther Activities     []$   Neuro Re-ed     []$   Vasocompression     []$  Gait     []$   Other     Total Billable time         Assessment: [x]$  Progressing toward goals. Discussed sleep schedule/routine as well as proper support for neck while sleeping. Cont's w/ manual as detailed above w/ good tol and improved tissue mobility after. Encouraged daily HEP and postural awareness. Will cont as tol.     []$  No change.     []$  Other:  [x]$  Patient would continue to benefit from skilled physical therapy services in order to: Decrease tightness in cervical paraspinals, suboccipitals am pecs and strengthen scapular stabilizers in order to improve overall postural stabilization and decrease strain on head and neck.       STG: (to be met in 6 treatments)  ? Pain: Decrease pain levels to 2/10 at worst in neck and shoulder  ? ROM: Optimize AROM of cervical spine and bilat UE's  ? Strength: Bilat shoulders grossly 5/5, demonstrate improvements in scapular/postural stability   ? Function: Pt to report improved sleep and incr ease w/ ADL's, lifting, reading and driving  Patient to be independent with home exercise program as demonstrated by performance with correct form  without cues.     LTG: (to be met in 12 treatments)  Pt to report decr frequency and intensity of HA's and UE N&T  2.  Improve score on assessment tools from 30% impaired to 25% impaired, demonstrating an improvement in overall function     Pt goals: no more pain/numbness also, strengthen grip in hand      Pt. Education:  [x]$  Yes  []$  No  [x]$  Reviewed Prior HEP/Ed  Method of Education: [x]$  Verbal  []$  Demo  []$  Written  Comprehension of Education:  [x]$  Verbalizes understanding.  []$  Demonstrates understanding.  []$  Needs review.  []$  Demonstrates/verbalizes HEP/Ed previously given.     Plan: [x]$  Continue current frequency toward long and short term goals.    [x]$  Specific Instructions for subsequent treatments: see above      Time In:8:00 am            Time Out: 8:50 am    Electronically signed by:  Karie Schwalbe, PT

## 2022-09-08 ENCOUNTER — Inpatient Hospital Stay: Payer: PRIVATE HEALTH INSURANCE | Primary: Nurse Practitioner

## 2022-09-15 ENCOUNTER — Inpatient Hospital Stay: Admit: 2022-09-15 | Discharge: 2022-09-16 | Payer: PRIVATE HEALTH INSURANCE | Primary: Nurse Practitioner

## 2022-09-15 DIAGNOSIS — M542 Cervicalgia: Secondary | ICD-10-CM

## 2022-09-15 NOTE — Other (Incomplete)
[]  [facility] &  Therapy  [address]  [phone] []  [facility]  Outpatient Rehabilitation &  Therapy  [address]  P: [phone]  F: [phone] [x]  [facility]  Outpatient Rehabilitation &  Therapy  [address] [place]: [phone]  F: [phone] []  [facility] - [facility]  Outpatient Rehabilitation &  Therapy  [address]  [phone] []  [facility]  Outpatient Rehabilitation &  Therapy  [address] [place] [address]   [place]: [phone]  F: [phone]  []  [facility] - [address]  Outpatient Rehabilitation &  Therapy  [address]  P: [phone]  F: [phone] []  [facility] - [facility]  Outpatient Rehabilitation &  Therapy  [address]  P: [phone]  F: [phone] []  [facility] &  Therapy  [address]  [place]: [phone]  F: [phone] []  [facility]  Outpatient Rehabilitation &  Therapy  [address]  [place]: [phone]  F: [phone]  []  [facility] &  Therapy  [address]  [place]: [phone]  F: [phone]     Physical Therapy Daily Treatment Note    Date:  09/15/2022  Patient Name:  Monica Wood    DOB:  1976/06/14  MRN: [id number]  Physician: Levander Campion, APRN - CNP  Insurance: [facility] after 30 visits  Medical Diagnosis: Cervicalgia                    Rehab Codes: M54.2,  R29.3, M99.01     Onset Date: 08/05/22 script                                        Next Dr.'s appt.: 10/29/22  Visit# / total visits: 3/12     Cancels/No Shows: 0    Subjective:  Pt states she has a HA from work today, shoulders are stiff, Notes she had a massage over the weekend and felt good. Getting over being ill, tired today. States has been more compliant w/  HEP.  Pain:  [x]  Yes  []  No Location: neck, head, R UE  Pain Rating: (0-10 scale) 2/10  Pain altered Tx:  [x]  No  []  Yes  Action:  Comments:    Objective:  Modalities:   Precautions:  Manual: cerv down glides, manual traction, SOR, passive stretches UT, lev, cerv rot. MFR UT, lev, scm. MFR/CST thoracic inlet, respiratory diaphragm, R UE and cervical positional release.     Exercises:  Exercise Reps/ Time Weight/ Level Comments   Cerv rot x       UT, lev stretch x                 Post shld rolls x       Scap retraction x                            Other: add DN?      Treatment Charges: Mins Units   []   Modalities     []   Ther Exercise     [x]   Manual Therapy 45 3   []   Ther Activities     []   Neuro Re-ed     []   Vasocompression     []  Gait     []   Other     Total Billable time         Assessment: [x]  Progressing toward goals. Discussed sleep schedule/routine as well as proper support for neck while sleeping. Cont's w/ manual as detailed above w/ good tol and improved tissue mobility after. Encouraged daily HEP and postural awareness. Will cont as tol.     []  No change.     []  Other:  [x]  Patient would continue to benefit from skilled physical therapy services in order to: Decrease tightness in cervical paraspinals, suboccipitals am pecs and strengthen scapular stabilizers in order to improve overall postural stabilization and decrease strain on head and neck.       STG: (to be met in 6 treatments)  ? Pain: Decrease pain levels to 2/10 at worst in neck and shoulder  ? ROM: Optimize AROM of cervical spine and bilat UE's  ? Strength: Bilat shoulders grossly 5/5, demonstrate improvements in scapular/postural stability   ? Function: Pt to report improved sleep and incr ease w/ ADL's, lifting, reading and driving  Patient to be independent with home exercise program as demonstrated by performance with correct form without cues.     LTG: (to be met in 12 treatments)  Pt to report decr frequency and intensity of HA's and UE  N&T  2.  Improve score on assessment tools from 30% impaired to 25% impaired, demonstrating an improvement in overall function     Pt goals: no more pain/numbness also, strengthen grip in hand      Pt. Education:  [x]  Yes  []  No  [x]  Reviewed Prior HEP/Ed  Method of Education: [x]  Verbal  []  Demo  []  Written  Comprehension of Education:  [x]  Verbalizes understanding.  []  Demonstrates understanding.  []  Needs review.  []  Demonstrates/verbalizes HEP/Ed previously given.     Plan: [x]  Continue current frequency toward long and short term goals.    [x]  Specific Instructions for subsequent treatments: see above      Time In: 6:03 pm            Time Out: 5:56 pm    Electronically signed by:  Lorrin Jackson, PT

## 2022-09-22 ENCOUNTER — Inpatient Hospital Stay: Admit: 2022-09-22 | Discharge: 2022-09-22 | Payer: PRIVATE HEALTH INSURANCE | Primary: Nurse Practitioner

## 2022-09-22 NOTE — Other (Signed)
$'[]'j$  [facility] &  Therapy  [address]  [phone] '[]'$  [place]  [address]  P: [phone]  F: [phone] '[x]'$  [place]  [address] [place]: [phone]  F: [phone] '[]'$  [place]  [phone]  [phone] '[]'$  [facility]  Outpatient Rehabilitation &  Therapy  [place] [address]   [place]: [phone]  F: [phone]  '[]'$  [place]  P: [phone]  F: [phone] '[]'$  [place]  [address]  P: [phone]  F: [phone] '[]'$  [place]  [address]  [place]: [phone]  F: [phone] '[]'$  [place]  [address]  [place]: [phone]  F: [phone]  '[]'$  [place]  [address]  [place]: [phone]  F: [phone]     Physical Therapy Daily Treatment Note    Date:  09/22/2022  Patient Name:  Monica Wood    DOB:  [birth year]  MRN: [id number]  Physician: Brion Aliment, APRN - CNP  Insurance: [facility] after 30 visits  Medical Diagnosis: Cervicalgia                    Rehab Codes: M54.2,  R29.3, M99.01     Onset Date: 08/05/22 script                                        Next Dr.'s appt.: 10/29/22  Visit# / total visits: 4/12     Cancels/No Shows: 0    Subjective:  Pt states she tol added DN well last visit. No recent HA and neck has been feeling better. Cont's w/ intermittent N&T R side of body mostly positional/sleeping. Notes she has not been as  compliant w/ HEP as should be.  Pain:  '[x]'$  Yes  '[]'$  No Location: neck, head, R UE  Pain Rating: (0-10 scale) 0-1/10 "stiff  Pain altered Tx:  '[x]'$  No  '[]'$  Yes  Action:  Comments:    Objective:  Modalities:   Precautions:  Manual:  IDN cerv-thoracic paraspinals, UT, lev, suboccipitals and homeostatics in prone. MFR/TPr-prone thoracic PA mobs, UT, lev. Cerv PROM/stretches, cerv down glides, manual traction, UT/lev stretch. CST/MFR SOR, thoracic inlet, respiratory diaphragm, frontal lift, sphenoid release, temporal release, parietal release, R UE and cervical positional release.     Exercises:  Exercise Reps/ Time Weight/ Level Comments   Cerv rot x       UT, lev stretch x                 Post shld rolls x       Scap retraction x            UE tband:       Pec stretch  Other:     Treatment Charges: Mins Units   '[]'$   Modalities     '[x]'$   Ther Exercise 5 NC   '[x]'$   Manual Therapy 48 4   '[]'$   Ther Activities     '[]'$   Neuro Re-ed     '[]'$   Vasocompression     '[]'$  Gait     '[]'$   Other     Total Billable time         Assessment: '[x]'$  Progressing toward goals. DN w/ incr dosage and incr time spent on manual this date. Improving cervical mobility and decr muscular tension noted in suboccipitals and cerv paraspinals. Pt w/ good tol and no adverse reaction. Briefly rev HEP and encouraged daily. Encouraged incr hydration as well as postural awareness esp w/ work duties. Will cont as tol. 1x a week.     '[]'$  No change.     '[x]'$  Other: Dry needling performed in conjunction with manual therapy to facilitate circulation, promote tissue mobility and reduce pain. No charge submitted for the time the needle was inserted.   '[x]'$  Patient would continue to benefit from skilled physical therapy services in order to: Decrease tightness in cervical paraspinals, suboccipitals am pecs and strengthen scapular stabilizers in order to improve overall postural stabilization and decrease strain on head and neck.       STG: (to be met in 6 treatments)  ? Pain:  Decrease pain levels to 2/10 at worst in neck and shoulder  ? ROM: Optimize AROM of cervical spine and bilat UE's  ? Strength: Bilat shoulders grossly 5/5, demonstrate improvements in scapular/postural stability   ? Function: Pt to report improved sleep and incr ease w/ ADL's, lifting, reading and driving  Patient to be independent with home exercise program as demonstrated by performance with correct form without cues.     LTG: (to be met in 12 treatments)  Pt to report decr frequency and intensity of HA's and UE N&T  2.  Improve score on assessment tools from 30% impaired to 25% impaired, demonstrating an improvement in overall function     Pt goals: no more pain/numbness also, strengthen grip in hand      Pt. Education:  '[x]'$  Yes  '[]'$  No  '[x]'$  Reviewed Prior HEP/Ed  Method of Education: '[x]'$  Verbal  '[]'$  Demo  '[]'$  Written  Comprehension of Education:  '[x]'$  Verbalizes understanding.  '[]'$  Demonstrates understanding.  '[]'$  Needs review.  '[]'$  Demonstrates/verbalizes HEP/Ed previously given.     Plan: '[x]'$  Continue current frequency toward long and short term goals.    '[x]'$  Specific Instructions for subsequent treatments: see above      Time In: 6:02 pm            Time Out: 7:00 pm    Electronically signed by:  Karie Schwalbe, PT

## 2022-09-29 ENCOUNTER — Inpatient Hospital Stay: Admit: 2022-09-29 | Discharge: 2022-09-30 | Payer: PRIVATE HEALTH INSURANCE | Primary: Nurse Practitioner

## 2022-09-29 NOTE — Other (Signed)
[]  [facility] &  Therapy  [address]  [phone]  [phone] []  [place]  [address]  P: [phone]  F: [phone] [x]  [place]  [address] [place]: [phone]  F: [phone] []  [place]  [phone]  [phone] []  [place]  [place]: [phone]  F: [phone]  []  [place]  Outpatient Rehabilitation &  Therapy  [address]  P: [phone]  F: [phone] []  [place]  [address]  P: [phone]  F: [phone] []  [place]  [address]  [place]: [phone]  F: [phone] []  [place]  [address]  [place]: [phone]  F: [phone]  []  [place]  [address]  [place]: [phone]  F: [phone]     Physical Therapy Daily Treatment Note    Date:  09/29/2022  Patient Name:  Monica Wood    DOB:  1976/01/30  MRN: [id number]  Physician: Brion Aliment, APRN - CNP  Insurance: [facility] after 30 visits  Medical Diagnosis: Cervicalgia                    Rehab Codes: M54.2,  R29.3, M99.01     Onset Date: 08/05/22 script                                        Next Dr.'s appt.: 10/29/22  Visit# / total visits: 5/12     Cancels/No Shows: 0    Subjective:  Pt states she had had a rough couple of days. Had some strange occurences over the weekend noting on Sat she was driving on the wrong side of road, was confused, "out of sorts" noting she has  been getting overwhelmed easily. Notes having some visual disturbances and can't see with her glasses, did schedule to see eye Dr soon. No sig change in symptoms, notes incr HA when she gets stresses, has a little HA today.     tol added DN well last visit. No recent HA and neck has been feeling better. Cont's w/ intermittent N&T R side of body mostly positional/sleeping. Notes she has not been as compliant w/ HEP as should be.  Pain:  [x]  Yes  []  No Location: neck, head, R UE  Pain Rating: (0-10 scale) 0-1/10 "stiff" tense  Pain altered Tx:  [x]  No  []  Yes  Action:  Comments:    Objective:  Modalities:   Precautions:  Manual:  IDN cerv-thoracic paraspinals, UT, lev, suboccipitals and homeostatics in prone. MFR/TPr-prone thoracic PA mobs, UT, lev. Cerv PROM/stretches, cerv down glides, manual traction, UT/lev stretch. CST/MFR SOR, thoracic inlet, respiratory diaphragm, frontal lift, sphenoid release, temporal release, parietal release, No -R UE and cervical positional release.  Exercises:  Exercise Reps/ Time Weight/ Level Comments   Cerv rot x       UT, lev stretch x                 Post shld rolls x       Scap retraction x            UE tband:   *    Pec stretch  *        Other:     Treatment Charges: Mins Units   []   Modalities     [x]   Ther Exercise 5 NC   [x]   Manual Therapy 50 4   []   Ther Activities     []   Neuro Re-ed     []   Vasocompression     []  Gait     []   Other     Total Billable time         Assessment: [x]  Progressing toward goals. Encouraged pt to f/u w/ Dr re strange occurences, and to let eye Dr know about her concerns. Cont 'd w/ DN and manual as detailed above. Incr tightness and tension noted throughout R head, neck and shoulder girdle. Pt w/ good tol and no adverse reaction. Briefly rev HEP and encouraged daily as well as postural awareness esp w/ work duties. Will cont as tol. 1x a week.     []  No change.     [x]  Other: Dry needling performed in conjunction with manual therapy to  facilitate circulation, promote tissue mobility and reduce pain. No charge submitted for the time the needle was inserted.   [x]  Patient would continue to benefit from skilled physical therapy services in order to: Decrease tightness in cervical paraspinals, suboccipitals am pecs and strengthen scapular stabilizers in order to improve overall postural stabilization and decrease strain on head and neck.       STG: (to be met in 6 treatments)  ? Pain: Decrease pain levels to 2/10 at worst in neck and shoulder- MET  ? ROM: Optimize AROM of cervical spine and bilat UE's  ? Strength: Bilat shoulders grossly 5/5, demonstrate improvements in scapular/postural stability   ? Function: Pt to report improved sleep and incr ease w/ ADL's, lifting, reading and driving  Patient to be independent with home exercise program as demonstrated by performance with correct form without cues.     LTG: (to be met in 12 treatments)  Pt to report decr frequency and intensity of HA's and UE N&T  2.  Improve score on assessment tools from 30% impaired to 25% impaired, demonstrating an improvement in overall function     Pt goals: no more pain/numbness also, strengthen grip in hand      Pt. Education:  [x]  Yes  []  No  [x]  Reviewed Prior HEP/Ed  Method of Education: [x]  Verbal  []  Demo  []  Written  Comprehension of Education:  [x]  Verbalizes understanding.  []  Demonstrates understanding.  []  Needs review.  []  Demonstrates/verbalizes HEP/Ed previously given.     Plan: [x]  Continue current frequency toward long and short term goals.    [x]  Specific Instructions for subsequent treatments: see above      Time In: 6:06 pm            Time Out: 7:10 pm    Electronically signed by:  Karie Schwalbe, PT

## 2022-10-06 ENCOUNTER — Inpatient Hospital Stay: Admit: 2022-10-06 | Discharge: 2022-10-07 | Payer: PRIVATE HEALTH INSURANCE | Primary: Nurse Practitioner

## 2022-10-06 NOTE — Other (Signed)
[]  [facility] &  Therapy  [address]  [phone]  [phone] []  [place]  [address]  P: [phone]  F: [phone] [x]  [place]  [address] [place]: [phone]  F: [phone] []  [place]  [phone] []  [facility]  Outpatient Rehabilitation &  Therapy  [place] [address]   [place]: [phone]  F: [phone]  []  [place]  P: [phone]  F: [phone] []  [place]  [address]  P: [phone]  F: [phone] []  [place]  [address]  [place]: [phone]  F: [phone] []  [place]  [address]  [place]: [phone]  F: [phone]  []  [place]  [address]  [place]: [phone]  F: [phone]     Physical Therapy Daily Treatment Note    Date:  10/06/2022  Patient Name:  Monica Wood    DOB:  05-17-1976  MRN: [id number]  Physician: Brion Aliment, APRN - CNP  Insurance: [facility] after 30 visits  Medical Diagnosis: Cervicalgia                    Rehab Codes: M54.2,  R29.3, M99.01     Onset Date: 08/05/22 script                                        Next Dr.'s appt.: 10/29/22  Visit# / total visits: 5/12     Cancels/No Shows: 0    Subjective:  Pt states its a rough day, my daughter is going through a rough patch and I'm under a lot of stress. Notes neck is tight L>R, no N&T at the moment. Reports she bought some new glasses and  helpful toning down the light, also saw eye Dr and had glasses adjusted. States has been more compliant w/ HEP/stretches and doing other ex as well.   Pain:  [x]  Yes  []  No Location: neck, head, R UE  Pain Rating: (0-10 scale) 0-1/10 "stiff" tense  Pain altered Tx:  [x]  No  []  Yes  Action:  Comments: Has f/u w/ GI in April.     Objective:  Modalities:   Precautions:  Manual:  IDN cerv-thoracic paraspinals, UT, lev, suboccipitals and homeostatics in prone. MFR/TPr-prone thoracic PA mobs, UT, lev. Cerv PROM/stretches, cerv down glides, manual traction, UT/lev stretch. CST/MFR SOR, thoracic inlet, respiratory diaphragm, frontal lift, sphenoid release, temporal release, parietal release, R UE and cervical positional release.     Exercises:  Exercise Reps/ Time Weight/ Level Comments   Cerv rot x       UT, lev stretch x                 Post shld rolls x  Scap retraction x            UE tband:   *    Pec stretch  *        Other:     Treatment Charges: Mins Units   []   Modalities     [x]   Ther Exercise 5 NC   [x]   Manual Therapy 50 4   []   Ther Activities     []   Neuro Re-ed     []   Vasocompression     []  Gait     []   Other     Total Billable time         Assessment: [x]  Progressing toward goals. Cont 'd w/ DN and manual as detailed above. Incr tightness and tension noted throughout cervical spine, suboccipitals and into shoulders. Pt w/ good tol and no adverse reaction. Briefly rev HEP and encouraged daily as well as postural awareness esp w/ work duties. Discussed importance of stress relieving activity esp throughout the day to keep tension down. Will cont as tol. 1x/bi weekly.      []  No change.     [x]  Other: Dry needling performed in conjunction with manual therapy to facilitate circulation, promote tissue mobility and reduce pain. No charge submitted for the time the needle was inserted.   [x]  Patient would continue to benefit from skilled physical therapy services in order to: Decrease tightness in cervical  paraspinals, suboccipitals am pecs and strengthen scapular stabilizers in order to improve overall postural stabilization and decrease strain on head and neck.       STG: (to be met in 6 treatments)  ? Pain: Decrease pain levels to 2/10 at worst in neck and shoulder- MET  ? ROM: Optimize AROM of cervical spine and bilat UE's  ? Strength: Bilat shoulders grossly 5/5, demonstrate improvements in scapular/postural stability   ? Function: Pt to report improved sleep and incr ease w/ ADL's, lifting, reading and driving  Patient to be independent with home exercise program as demonstrated by performance with correct form without cues.     LTG: (to be met in 12 treatments)  Pt to report decr frequency and intensity of HA's and UE N&T  2.  Improve score on assessment tools from 30% impaired to 25% impaired, demonstrating an improvement in overall function     Pt goals: no more pain/numbness also, strengthen grip in hand      Pt. Education:  [x]  Yes  []  No  [x]  Reviewed Prior HEP/Ed  Method of Education: [x]  Verbal  []  Demo  []  Written  Comprehension of Education:  [x]  Verbalizes understanding.  []  Demonstrates understanding.  []  Needs review.  []  Demonstrates/verbalizes HEP/Ed previously given.     Plan: [x]  Continue current frequency toward long and short term goals.    [x]  Specific Instructions for subsequent treatments: see above      Time In: 6:02 pm            Time Out: 7:01 pm    Electronically signed by:  Karie Schwalbe, PT

## 2022-10-08 MED FILL — LISDEXAMFETAMINE DIMESYLATE 20 CAPS: 20 20 MG | 10 days supply | Qty: 20 | Fill #0 | Status: AC

## 2022-10-13 ENCOUNTER — Inpatient Hospital Stay: Admit: 2022-10-13 | Discharge: 2022-10-14 | Payer: PRIVATE HEALTH INSURANCE | Primary: Nurse Practitioner

## 2022-10-13 DIAGNOSIS — M542 Cervicalgia: Secondary | ICD-10-CM

## 2022-10-13 NOTE — Other (Signed)
[]  [place]  [address]  [phone]  [phone] []  [place]  [address]  P: [phone]  F: [phone] [x]  [place]  [address] [place]: [phone]  F: [phone] []  [place]  [phone] []  [place]  [place]: [phone]  F: [phone]  []  [place]  P: [phone]  F: [phone] []  [place]  [place].  [address]  P: [phone]  F: [phone] []  [place]  [address]  [place]: [phone]  F: [phone] []  [place]  [address]  [place]: [phone]  F: [phone]  []  [place]  [address]  [place]: [phone]  F: [phone]     Physical Therapy Daily Treatment Note    Date:  10/13/2022  Patient Name:  Monica Wood    DOB:  10/03/1975  MRN: [id number]  Physician: Brion Aliment, APRN - CNP  Insurance: [facility] [place] after 30 visits  Medical Diagnosis: Cervicalgia                    Rehab Codes: M54.2,  R29.3, M99.01     Onset Date: 08/05/22 script                                        Next Dr.'s appt.: 10/29/22  Visit# / total visits: 6/12     Cancels/No Shows: 0    Subjective:  Pt reports the usual tension in neck and shoulders, R side/liver area more bothersome. Notes she should be taking dig enzymes with each meal but hasn't been taking them through the day and  feels this aggravates her liver and stomach. States complaint w/ HEP. Notes N&T was bad this weekend stating "the vein felt like it was stuck had to shake it a lot."    Pain:  [x]  Yes  []  No Location: neck, head, R UE  Pain Rating: (0-10 scale) 0-1/10 "stiff" tense, R side/liver 4/10  Pain altered Tx:  [x]  No  []  Yes  Action:  Comments: Has f/u w/ GI in April.     Objective:  Modalities:   Precautions:  Manual:  IDN cerv-thoracic paraspinals, UT, lev, suboccipitals and homeostatics in prone. MFR/TPr-prone thoracic PA mobs, UT, lev. Cerv PROM/stretches, cerv down glides, manual traction, UT/lev stretch. CST/MFR SOR, thoracic inlet, respiratory diaphragm, frontal lift, sphenoid release, temporal release, parietal release, pelvic diaphragm,VI & DI w/ SER. No-R UE and cervical positional release.     Exercises:  Exercise Reps/ Time Weight/ Level Comments   Cerv rot x       UT, lev stretch x  Post shld rolls x       Scap retraction x            UE tband:   *    Pec stretch  *        Other:     Treatment Charges: Mins Units   []   Modalities     [x]   Ther Exercise 3 NC   [x]   Manual Therapy 55 4   []   Ther Activities     []   Neuro Re-ed     []   Vasocompression     []  Gait     []   Other     Total Billable time         Assessment: [x]  Progressing toward goals. Cont 'd w/ DN and manual as detailed above. Pt w/ good tol and no adverse reaction.  Instructed pt in body scan/awareness and focused on manual this date w/ incr MFR and CST. Briefly rev HEP and encouraged daily as well as postural awareness. Discussed importance of stress relieving activity throughout the day to keep tension down, importance of hydration, listening to her body and digestive enzymes as directed by Dr. Aletha Halim cont as tol. 1x/bi weekly.      []  No change.     [x]  Other: Dry needling performed in conjunction with manual therapy to facilitate circulation, promote tissue mobility and reduce pain. No charge submitted for the time the needle was  inserted.   [x]  Patient would continue to benefit from skilled physical therapy services in order to: Decrease tightness in cervical paraspinals, suboccipitals am pecs and strengthen scapular stabilizers in order to improve overall postural stabilization and decrease strain on head and neck.       STG: (to be met in 6 treatments)  ? Pain: Decrease pain levels to 2/10 at worst in neck and shoulder- MET  ? ROM: Optimize AROM of cervical spine and bilat UE's  ? Strength: Bilat shoulders grossly 5/5, demonstrate improvements in scapular/postural stability   ? Function: Pt to report improved sleep and incr ease w/ ADL's, lifting, reading and driving  Patient to be independent with home exercise program as demonstrated by performance with correct form without cues.     LTG: (to be met in 12 treatments)  Pt to report decr frequency and intensity of HA's and UE N&T  2.  Improve score on assessment tools from 30% impaired to 25% impaired, demonstrating an improvement in overall function     Pt goals: no more pain/numbness also, strengthen grip in hand      Pt. Education:  [x]  Yes  []  No  [x]  Reviewed Prior HEP/Ed  Method of Education: [x]  Verbal  []  Demo  []  Written  Comprehension of Education:  [x]  Verbalizes understanding.  []  Demonstrates understanding.  []  Needs review.  []  Demonstrates/verbalizes HEP/Ed previously given.     Plan: [x]  Continue current frequency toward long and short term goals.    [x]  Specific Instructions for subsequent treatments: see above      Time In: 5:59 pm            Time Out: 7:07 pm    Electronically signed by:  Karie Schwalbe, PT

## 2022-10-13 NOTE — Other (Signed)
[]   [facility] &  Therapy  [address]  [phone]  [phone]  [facility] - [facility]  Outpatient Rehabilitation &  Therapy  [address]  P: [phone]  F: [phone]  [facility]  Outpatient Rehabilitation &  Therapy  [address] [place]: [phone]  F: [phone]  [facility]  Outpatient Rehabilitation &  Therapy  [address]  [facility]  Outpatient Rehabilitation &  Therapy  [address] [place] [address]   [place]: [phone]  F: [phone]   [facility] - [address]  Outpatient Rehabilitation &  Therapy  [address]  P: [phone]  F: [phone]  [facility]  Outpatient Rehabilitation &  Therapy  [address]  P: [phone]  F: [phone]  [facility] &  Therapy  [address]  [place]: [phone]  F: [phone]  [facility]  Outpatient Rehabilitation &  Therapy  [address]  [place]: [phone]  F: [phone]   [facility] &  Therapy  [address]  [place]: [phone]  F: [phone]     Therapy Cancel/No Show note    Date: 10/27/2022  Patient: Monica Wood  DOB: [birth year]  MRN: [id number]    Cancels/No Shows to date: 1/1    For today's appointment patient:      Cancelled     Rescheduled appointment     No-show     Reason given by patient:      Patient ill      Conflicting appointment     No transportation       Conflict with work     No reason given     Weather related     COVID-19     Other:      Comments:         Next appointment was confirmed    Electronically signed by: Lorrin Jackson, PT

## 2022-10-20 ENCOUNTER — Encounter: Payer: PRIVATE HEALTH INSURANCE | Primary: Nurse Practitioner

## 2022-10-27 ENCOUNTER — Ambulatory Visit
Admit: 2022-10-27 | Discharge: 2022-10-27 | Payer: PRIVATE HEALTH INSURANCE | Attending: Gastroenterology | Primary: Nurse Practitioner

## 2022-10-27 ENCOUNTER — Encounter: Payer: PRIVATE HEALTH INSURANCE | Primary: Nurse Practitioner

## 2022-10-27 DIAGNOSIS — R1011 Right upper quadrant pain: Secondary | ICD-10-CM

## 2022-10-27 MED ORDER — FAMOTIDINE 40 MG PO TABS
40 MG | ORAL_TABLET | Freq: Every evening | ORAL | 3 refills | Status: DC
Start: 2022-10-27 — End: 2023-01-20
  Filled 2022-10-28: qty 30, 30d supply, fill #0

## 2022-10-27 NOTE — Progress Notes (Signed)
Reason for Referral:   Levander Campion, APRN - CNP  [address]  [place],  [place] [address]    Chief Complaint   Patient presents with    New Patient     RUQ pain    Other     Patient states MRI showed mass on liver; foul smelling stool     Bloated     Patinet co bloating with abd pain     Constipation     Patient alternates constipation and diarrhea     Nausea     Patient co nausea and vomiting            HISTORY OF PRESENT ILLNESS: Monica Wood is a 47 y.o. female , referred for evaluation of abd pain ,s/p chole, liver cyst, pancreatic divism          Here for the first time   Abd pain RUQ 4-5 months    Daily ? Exacerbating or alleviating   Bloating   No diarrhea  N/v some times   No NSAIDS  Does drink 2-3 wine/2-3x week   Some Ht burn   Had Nissen wrap   Not taking acid medication   Some dysphagia with food impaction  solids mainly     Labs : anemia (  ablation no periods for 15years)    Past Medical,Family, and Social History reviewed and does contribute to the patient presentingcondition.        I did review all the labs results available for the labs which were ordered by the primary care physician, and the other consultants, we search on epic at [facility] and all the available care everywhere epic    I did review all the imaging studies of the abdomen available on EMR, ordered by the primary care physician and the other consultant    I did review all the pathology from the biopsies done on the previous endoscopies        Patient's PMH/PSH,SH,PSYCH Hx, MEDs, ALLERGIES, and ROS were all reviewed and updated in the appropriate sections.    PAST MEDICAL HISTORY:  Past Medical History:   Diagnosis Date    Bursitis     Fibromyalgia     Scoliosis     Tendinitis        Past Surgical History:   Procedure Laterality Date    ABDOMINOPLASTY      ESOPHAGOGASTRIC FUNDOPLICATION  2000    GALLBLADDER SURGERY      TUBAL LIGATION         CURRENT MEDICATIONS:    Current Outpatient Medications:      Lisdexamfetamine Dimesylate (VYVANSE) 20 MG CAPS, Take 2 capsules by mouth daily. Max Daily Amount: 40 mg, Disp: , Rfl:     DULoxetine (CYMBALTA) 60 MG extended release capsule, take 1 capsule by mouth once daily, Disp: 90 capsule, Rfl: 1    albuterol sulfate HFA (PROVENTIL HFA) 108 (90 Base) MCG/ACT inhaler, Inhale 2 puffs into the lungs every 6 hours as needed for Wheezing or Shortness of Breath, Disp: 18 g, Rfl: 0    DULoxetine (CYMBALTA) 30 MG extended release capsule, Take 1 capsule by mouth daily, Disp: 90 capsule, Rfl: 1    buPROPion (WELLBUTRIN XL) 150 MG extended release tablet, Take 1 tablet by mouth every morning, Disp: 90 tablet, Rfl: 1    tiZANidine (ZANAFLEX) 2 MG tablet, Take 1 tablet by mouth daily as needed (back muscle), Disp: 30 tablet, Rfl: 1    TRAZODONE HCL PO,  Take by mouth, Disp: , Rfl:     Viloxazine HCl ER (QELBREE) 100 MG CP24, , Disp: , Rfl:     ALLERGIES:   Allergies   Allergen Reactions    Parsley Leaves Anaphylaxis, Hives, Itching and Shortness Of Breath    Codeine      Anaphylaxis     Morphine        FAMILY HISTORY:       Problem Relation Age of Onset    Diabetes Mother     Diabetes Maternal Grandmother          SOCIAL HISTORY:   Social History     Socioeconomic History    Marital status: Married     Spouse name: Not on file    Number of children: Not on file    Years of education: Not on file    Highest education level: Not on file   Occupational History    Not on file   Tobacco Use    Smoking status: Never    Smokeless tobacco: Never   Substance and Sexual Activity    Alcohol use: Yes     Comment: 3/4 times a week    Drug use: Never    Sexual activity: Not on file   Other Topics Concern    Not on file   Social History Narrative    Not on file     Social Determinants of Health     Financial Resource Strain: Low Risk  (02/19/2022)    Overall Financial Resource Strain (CARDIA)     Difficulty of Paying Living Expenses: Not hard at all   Food Insecurity: Not on file (02/19/2022)    Transportation Needs: Unknown (02/19/2022)    PRAPARE - Therapist, art (Medical): Not on file     Lack of Transportation (Non-Medical): No   Physical Activity: Insufficiently Active (02/16/2022)    Exercise Vital Sign     Days of Exercise per Week: 4 days     Minutes of Exercise per Session: 30 min   Stress: Not on file   Social Connections: Not on file   Intimate Partner Violence: Not At Risk (02/16/2022)    Humiliation, Afraid, Rape, and Kick questionnaire     Fear of Current or Ex-Partner: No     Emotionally Abused: No     Physically Abused: No     Sexually Abused: No   Housing Stability: Unknown (02/19/2022)    Housing Stability Vital Sign     Unable to Pay for Housing in the Last Year: Not on file     Number of Places Lived in the Last Year: Not on file     Unstable Housing in the Last Year: No       REVIEW OF SYSTEMS: A 12-point review of systemswas obtained and pertinent positives and negatives were enumerated above in the history of present illness. All other reviewed systems / symptoms were negative.    Review of Systems   Constitutional:  Negative for appetite change, fatigue and unexpected weight change.   HENT:  Positive for trouble swallowing. Negative for postnasal drip.    Eyes:  Negative for visual disturbance.   Gastrointestinal:  Positive for abdominal distention, abdominal pain, constipation, diarrhea, nausea and vomiting. Negative for anal bleeding, blood in stool and rectal pain.   Allergic/Immunologic: Negative for environmental allergies and food allergies.   Neurological:  Negative for dizziness, light-headedness, numbness and headaches.   Hematological:  Does not  bruise/bleed easily.   Psychiatric/Behavioral:  Negative for sleep disturbance. The patient is not nervous/anxious.            LABORATORY DATA: Reviewed  Lab Results   Component Value Date    WBC 5.5 01/20/2021    HGB 11.0 (L) 01/20/2021    HCT 34.7 (L) 01/20/2021    MCV 72.8 (L) 01/20/2021    PLT 292 01/20/2021     NA 139 01/20/2021    K 4.3 01/20/2021    CL 105 01/20/2021    CO2 26 01/20/2021    BUN 8 01/20/2021    CREATININE 0.73 01/20/2021    LABALBU 4.1 01/20/2021    BILITOT 0.14 (L) 01/20/2021    ALKPHOS 133 (H) 01/20/2021    AST 22 01/20/2021    ALT 34 (H) 01/20/2021         Lab Results   Component Value Date    RBC 4.76 01/20/2021    HGB 11.0 (L) 01/20/2021    MCV 72.8 (L) 01/20/2021    MCH 23.1 (L) 01/20/2021    MCHC 31.7 01/20/2021    RDW 15.7 (H) 01/20/2021    MPV 7.4 01/20/2021         DIAGNOSTIC TESTING:     No results found.     BP 120/71   Pulse 58   Wt 101.2 kg (223 lb 3.2 oz)   SpO2 100%   BMI 38.31 kg/m     PHYSICAL EXAMINATION: Vital signs reviewed per the nursing documentation.       Body mass index is 38.31 kg/m.   Physical Exam  Vitals and nursing note reviewed.   Constitutional:       General: She is not in acute distress.     Appearance: She is well-developed. She is not diaphoretic.   HENT:      Head: Normocephalic.      Mouth/Throat:      Pharynx: No oropharyngeal exudate.   Eyes:      General: No scleral icterus.     Pupils: Pupils are equal, round, and reactive to light.   Neck:      Thyroid: No thyromegaly.      Vascular: No JVD.      Trachea: No tracheal deviation.   Cardiovascular:      Rate and Rhythm: Normal rate and regular rhythm.      Heart sounds: Normal heart sounds. No murmur heard.  Pulmonary:      Effort: Pulmonary effort is normal. No respiratory distress.      Breath sounds: Normal breath sounds. No wheezing.   Abdominal:      General: Bowel sounds are normal. There is no distension.      Palpations: Abdomen is soft.      Tenderness: There is no abdominal tenderness. There is no guarding or rebound.      Comments: No ascites   Musculoskeletal:         General: Normal range of motion.      Cervical back: Normal range of motion and neck supple.   Skin:     General: Skin is warm.      Coloration: Skin is not pale.      Findings: No erythema or rash.      Comments: She is not  diaphoretic   Neurological:      Mental Status: She is alert and oriented to person, place, and time.      Deep Tendon Reflexes: Reflexes are normal and symmetric.  Psychiatric:         Behavior: Behavior normal.         Thought Content: Thought content normal.         Judgment: Judgment normal.         IMPRESSION: Ms. Christley is a 47 y.o. female with      Diagnosis Orders   1. Abdominal pain, right upper quadrant  Comprehensive Metabolic Panel with Bilirubin    CBC with Auto Differential    Lipase    EGD    COLONOSCOPY W/ OR W/O BIOPSY    Pancreatic Elastase, Fecal      2. Dyspepsia        3. Esophageal dysphagia  EGD    Pancreatic Elastase, Fecal      4. Pancreatic divisum                  I did review all the GI medications with the side effects with the patient  I did refill her needed GI medication      Diet/life style/natural hx /complication of the dx were all explained in details   Past medical, past surgical, social history, psychiatric history, medications or allergies, all reviewed and  updated        Thank you for allowing me to participate in the care of Ms. Nardozzi. For any further questions please do not hesitate to contact me.      I have reviewed and agree with the MA/RN ROS.   Note is dictated utilizing voice recognition software. Unfortunately this leads to occasional typographical errors. Please contact our office if you have any questions.  This note is created with the assistance of the speech recognition program. While intending to generate a document that actually reflects the content of the visit, it can still have some errors including those of syntax and sound-a-like substitutions which may escape proof reading. Actual meaning can be extrapolated by contextual inference.  Marcia Brash, MD  [facility]  O: [phone]

## 2022-10-28 NOTE — Telephone Encounter (Signed)
Procedure scheduled/Dr Daboul  Procedure: EGD/Colon  Dx: Abdominal pain/ruq  Date: 12/02/22  Time: 9:45am/arrive 8:15am  Hospital: [facility]  PAT phone call: none  Bowel Prep instructions given: Miralax/Dulcolax/Mag Citrate  In office/via phone: office/mailed colonoscopy prep instructions  Clearance needed: none

## 2022-10-29 ENCOUNTER — Telehealth

## 2022-10-29 ENCOUNTER — Ambulatory Visit
Admit: 2022-10-29 | Discharge: 2022-10-29 | Payer: PRIVATE HEALTH INSURANCE | Attending: Nurse Practitioner | Primary: Nurse Practitioner

## 2022-10-29 ENCOUNTER — Inpatient Hospital Stay: Payer: PRIVATE HEALTH INSURANCE | Primary: Nurse Practitioner

## 2022-10-29 DIAGNOSIS — R1011 Right upper quadrant pain: Secondary | ICD-10-CM

## 2022-10-29 MED ORDER — POLYETHYLENE GLYCOL 3350 17 GM/SCOOP PO POWD
17 GM/SCOOP | ORAL | 0 refills | Status: DC
Start: 2022-10-29 — End: 2022-10-29

## 2022-10-29 MED ORDER — CITRATE OF MAGNESIA PO SOLN
Freq: Once | ORAL | 0 refills | Status: DC
Start: 2022-10-29 — End: 2022-10-29

## 2022-10-29 MED ORDER — BISACODYL 5 MG PO TBEC
5 | ORAL_TABLET | ORAL | 0 refills | Status: DC
Start: 2022-10-29 — End: 2022-10-29

## 2022-10-29 NOTE — Telephone Encounter (Signed)
Patient called in stating she contacted vascular surgery but they are requesting her to have a PVR completed prior to scheduling her for an appointment    She is asking for an order

## 2022-10-29 NOTE — Telephone Encounter (Signed)
ordered

## 2022-10-29 NOTE — Progress Notes (Signed)
[facility]  [facility] PRIMARY CARE  [address]  [address]  [place] [address]  Dept: [phone]  Dept Fax: [phone]    Galen Malkowski is a 47 y.o. female who presents today for her medical conditions/complaintsas noted below.  Corona Popovich is c/o of Medication Check (5 month follow up) and Health Maintenance (Scheduled for EGD and Colonoscopy on 12/02/22)        HPI:     Patient presents for follow-up  Blood pressure stable  Weight is stable    Patient presents for follow-up    She saw gi yesterday. 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 Given pepcid.   Planning for egd and colonoscopy  Needs labs that gi ordered.     Numbness in arm and leg. Pain into neck and shoulder   Worse if arm or leg is bent. Will go numb and cold. Right side. Going to PT. Doing myofascial release and dry needling which helped but then it comes back.   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         Past Medical History:   Diagnosis Date    Bursitis     Fibromyalgia     Scoliosis     Tendinitis       Past Surgical History:   Procedure Laterality Date    ABDOMINOPLASTY      ESOPHAGOGASTRIC FUNDOPLICATION  2000    GALLBLADDER SURGERY      TUBAL LIGATION         Family History   Problem Relation Age of Onset    Diabetes Mother     Diabetes Maternal Grandmother        Social History     Tobacco Use    Smoking status: Never    Smokeless tobacco: Never   Substance Use Topics    Alcohol use: Yes     Comment: 3/4 times a week      Current Outpatient Medications   Medication Sig Dispense Refill    famotidine (PEPCID) 40 MG tablet Take 1 tablet by mouth every evening 30 tablet 3    DULoxetine (CYMBALTA) 60 MG extended release capsule take 1 capsule by mouth once daily 90 capsule 1    albuterol sulfate HFA (PROVENTIL HFA) 108 (90 Base) MCG/ACT inhaler Inhale 2 puffs into the lungs every 6 hours as needed for Wheezing or Shortness of Breath 18  g 0    DULoxetine (CYMBALTA) 30 MG extended release capsule Take 1 capsule by mouth daily 90 capsule 1    buPROPion (WELLBUTRIN XL) 150 MG extended release tablet Take 1 tablet by mouth every morning 90 tablet 1    tiZANidine (ZANAFLEX) 2 MG tablet Take 1 tablet by mouth daily as needed (back muscle) 30 tablet 1    TRAZODONE HCL PO Take by mouth       No current facility-administered medications for this visit.     Allergies   Allergen Reactions    Parsley Leaves Anaphylaxis, Hives, Itching and Shortness Of Breath    Codeine      Anaphylaxis     Morphine        Health Maintenance   Topic Date Due    Hepatitis B vaccine (1 of 3 - 3-dose series) Never done    COVID-19 Vaccine (1) Never done    DTaP/Tdap/Td vaccine (1 - Tdap) Never done    Flu vaccine (  Season Ended) 02/12/2023    Depression Monitoring  10/29/2023    Colorectal Cancer Screen  03/01/2025    Cervical cancer screen  05/28/2025    Lipids  11/06/2025    Hepatitis A vaccine  Aged Out    Hib vaccine  Aged Out    HPV vaccine  Aged Out    Polio vaccine  Aged Out    Meningococcal (ACWY) vaccine  Aged Out    Pneumococcal 0-64 years Vaccine  Aged Out    Depression Screen  Discontinued    Diabetes screen  Discontinued    Hepatitis C screen  Discontinued    HIV screen  Discontinued       :     Review of Systems   Constitutional:  Negative for chills, fatigue and fever.   HENT:  Negative for ear discharge, ear pain, sinus pressure, sinus pain, sore throat and trouble swallowing.    Eyes:  Negative for discharge, redness and itching.   Respiratory:  Negative for cough, chest tightness, shortness of breath and wheezing.    Cardiovascular:  Negative for chest pain.   Gastrointestinal:  Positive for abdominal pain. Negative for diarrhea, nausea and vomiting.   Genitourinary:  Negative for difficulty urinating.   Musculoskeletal:  Positive for back pain and neck pain. Negative for arthralgias.   Skin:  Negative for rash.   Neurological:  Positive for numbness. Negative for  dizziness, weakness, light-headedness and headaches.   All other systems reviewed and are negative.      Objective:     Physical Exam  Constitutional:       General: She is not in acute distress.     Appearance: Normal appearance. She is obese. She is not ill-appearing.   HENT:      Head: Normocephalic and atraumatic.      Nose: Nose normal. No congestion or rhinorrhea.      Mouth/Throat:      Mouth: Mucous membranes are moist.      Pharynx: Oropharynx is clear. No oropharyngeal exudate or posterior oropharyngeal erythema.   Eyes:      Extraocular Movements: Extraocular movements intact.      Conjunctiva/sclera: Conjunctivae normal.      Pupils: Pupils are equal, round, and reactive to light.   Cardiovascular:      Rate and Rhythm: Normal rate and regular rhythm.      Pulses: Normal pulses.      Heart sounds: Normal heart sounds. No murmur heard.  Pulmonary:      Effort: Pulmonary effort is normal. No respiratory distress.      Breath sounds: Normal breath sounds. No wheezing.   Abdominal:      General: Abdomen is flat. Bowel sounds are normal. There is no distension.      Palpations: Abdomen is soft.      Tenderness: There is no abdominal tenderness. There is no guarding.   Musculoskeletal:         General: Normal range of motion.      Cervical back: Normal range of motion and neck supple.   Skin:     General: Skin is warm and dry.      Capillary Refill: Capillary refill takes less than 2 seconds.   Neurological:      General: No focal deficit present.      Mental Status: She is alert and oriented to person, place, and time.      Motor: No weakness.      Coordination: Coordination normal.  Gait: Gait normal.   Psychiatric:         Mood and Affect: Mood normal.         Behavior: Behavior normal.         Thought Content: Thought content normal.       BP 98/78 (Site: Right Upper Arm)   Pulse 61   Resp 16   Wt 98.9 kg (218 lb)   SpO2 99%   BMI 37.42 kg/m     Assessment:   Assessment & Plan    Diagnosis Orders    1. RUQ pain        2. Numbness and tingling        3. Decreased circulation  [place] - Faythe Dingwall, Merton Border, MD, Vascular Surgery, [place]          :      Return in about 3 months (around 01/28/2023) for follow up.  Right upper quadrant pain  Patient did see gastroenterology.  They are ordering additional testing and lab work.  She is going to be having an EGD and colonoscopy.  They also started her on famotidine which she has just started  2.  Numbness and tingling  Patient complains of numbness and tingling in her arm and leg.  She does have a known bulging disc.  I did discuss with her that it could be the reason why she has the numbness and tingling down her leg.  She is concerned that something to do with her vasculature that is causing her issues on the right side.  I did offer to do x-rays and/or MRIs along with physical therapy.  She would like to hold off.  She would like a referral to vascular  3.  Decreased circulation  Patient is concerned that she is having decree circulation which is causing her problems.  Referral placed to vascular    Patient is going to follow-up in 3 months.  She may return sooner if needed    Orders Placed This Encounter   Procedures    [place] - Lacretia Nicks, MD, Vascular Surgery, [place]     Referral Priority:   Routine     Referral Type:   Eval and Treat     Referral Reason:   Specialty Services Required     Referred to Provider:   Lacretia Nicks, MD     Requested Specialty:   Vascular Surgery     Number of Visits Requested:   1     No orders of the defined types were placed in this encounter.      Patient given educational materials - seepatient instructions.  Discussed use, benefit, and side effects of prescribed medications.All patient questions answered.  Pt voiced understanding. Reviewed health maintenance.Instructed to continue current medications, diet and exercise.  Patient agreedwith treatment plan. Follow up as directed.      Electronically signed by  Levander Campion, APRN - CNP on 4/17/2024at 3:19 PM

## 2022-10-30 NOTE — Telephone Encounter (Signed)
LVM for Pt to call back

## 2022-10-30 NOTE — Telephone Encounter (Signed)
Patient called back into the office. Writer gave central scheduling number.    Last Visit Date: 10/29/2022   Next Visit Date: 01/28/2023

## 2022-11-03 ENCOUNTER — Inpatient Hospital Stay: Payer: PRIVATE HEALTH INSURANCE | Primary: Nurse Practitioner

## 2022-11-03 NOTE — Other (Signed)
[]   [facility] &  Therapy  [address]  [phone]  [phone]  [facility] - [facility]  Outpatient Rehabilitation &  Therapy  [address]  P: [phone]  F: [phone]  [facility]  Outpatient Rehabilitation &  Therapy  [address] [place]: [phone]  F: [phone]  [facility] - [facility]  Outpatient Rehabilitation &  Therapy  [address]  [facility]  Outpatient Rehabilitation &  Therapy  [address] [place] [address]   [place]: [phone]  F: [phone]   [facility] - [address]  Outpatient Rehabilitation &  Therapy  [address]  P: [phone]  F: [phone]  [facility]  Outpatient Rehabilitation &  Therapy  [address]  P: [phone]  F: [phone]  [facility] &  Therapy  [address]  [place]: [phone]  F: [phone]  [facility]  Outpatient Rehabilitation &  Therapy  [address]  [place]: [phone]  F: [phone]   [facility] - [facility] &  Therapy  [address]  [place]: [phone]  F: [phone]     Therapy Cancel/No Show note    Date: 11/03/2022  Patient: Monica Wood  DOB: [birth year]  MRN: [id number]    Cancels/No Shows to date: 2/0    For today's appointment patient:      Cancelled     Rescheduled appointment     No-show     Reason given by patient:      Patient ill      Conflicting appointment     No transportation       Conflict with work     No reason given     Weather related     COVID-19     Other:      Comments:         Next appointment was confirmed    Electronically signed by: Ok Anis, PT

## 2022-11-04 ENCOUNTER — Inpatient Hospital Stay: Payer: PRIVATE HEALTH INSURANCE | Primary: Nurse Practitioner

## 2022-11-04 DIAGNOSIS — R1011 Right upper quadrant pain: Secondary | ICD-10-CM

## 2022-11-04 NOTE — ED Provider Notes (Signed)
History  Chief Complaint   Patient presents with   . Sore Throat     HPI  Complaints of 2-3 days of sore throat cough sinus drainage had subjective fevers chills and thinks she might have had sweats as well.  Patient noticed a white spot in her throat which concerned her.  She has no chest pain.  She does have shortness of breath which she says is baseline for her due to reactive airway disease with that was likely post COVID in 2022.  She did try her inhaler for this shortness of breath.  She has no abdominal pain or extremity complaints.    Patient is not diabetic no hypertension does not smoke.  Problem List Items Addressed This Visit    None  Visit Diagnoses       Non-recurrent acute suppurative otitis media of right ear without spontaneous rupture of tympanic membrane    -  Primary            Past Medical History:   Diagnosis Date   . Arthritis    . Bursitis    . Tendonitis        Past Surgical History:   Procedure Laterality Date   . CHOLECYSTECTOMY     . COLONOSCOPY     . SURGERY OF LIP     . TUBAL LIGATION         * Travel Screening     No screening recorded since 11/03/22 1812       Travel History   Travel since 10/04/22    No documented travel since 10/04/22         No family history on file.    Social History     Substance and Sexual Activity   Drug Use Yes    Comment: THC edibles uses for pain       Social History     Tobacco Use   . Smoking status: Never   . Smokeless tobacco: Never   Substance Use Topics   . Alcohol use: Never   . Drug use: Yes     Comment: THC edibles uses for pain       Review of Systems   Constitutional:  Positive for chills, diaphoresis and fever.   HENT:  Positive for sore throat. Negative for ear pain and sinus pressure.    Respiratory:  Positive for shortness of breath.  Cardiovascular: Negative.    Gastrointestinal: Negative.    Genitourinary: Negative.    Musculoskeletal: Negative.    Neurological: Negative.        Physical Exam  ED Triage Vitals   Temp Heart Rate Resp BP SpO2    11/04/22 1818 11/04/22 1815 11/04/22 1815 11/04/22 1816 11/04/22 1815   37.9 C (100.2 F) 71 18 133/54 100 %      Temp Source Heart Rate Source Patient Position BP Location FiO2 (%)   11/04/22 1818 -- -- -- --   Oral           Vitals:    11/04/22 1815 11/04/22 1816 11/04/22 1818   BP:  133/54    Temp:   37.9 C (100.2 F)   TempSrc:   Oral   Pulse: 71     Resp: 18     SpO2: 100%     Height: 162.6 cm (5\' 4" )     Weight: 98 kg (216 lb)             Physical Exam  Vitals reviewed.  Constitutional:       Appearance: She is well-developed.   HENT:      Head: Normocephalic and atraumatic.      Right Ear: Ear canal normal.      Left Ear: Tympanic membrane and ear canal normal.      Ears:      Comments: Right TM markedly erythematous and dull     Mouth/Throat:      Mouth: Mucous membranes are moist.      Pharynx: Posterior oropharyngeal erythema present. No pharyngeal swelling, oropharyngeal exudate or uvula swelling.   Eyes:      Conjunctiva/sclera: Conjunctivae normal.      Pupils: Pupils are equal, round, and reactive to light.   Cardiovascular:      Rate and Rhythm: Normal rate and regular rhythm.   Pulmonary:      Effort: Pulmonary effort is normal.      Breath sounds: Normal breath sounds.   Abdominal:      General: Bowel sounds are normal.      Palpations: Abdomen is soft.   Musculoskeletal:      Cervical back: Normal range of motion and neck supple.   Skin:     General: Skin is warm and dry.   Neurological:      General: No focal deficit present.      Mental Status: She is alert and oriented to person, place, and time.              Procedure  Procedures    Re-Evaluation  Re-Evaluation    ED Course  Clinical Impressions as of 11/04/22 1906   Non-recurrent acute suppurative otitis media of right ear without spontaneous rupture of tympanic membrane       MDM  Medical Decision Making  Risk  Prescription drug management.      Strep screen was negative however her right TM was quite erythematous and dull.  I will place  her on Zithromax.  She will take Tylenol and Motrin for pain if needed.  She will take Tessalon Perles for cough.  She will follow-up with primary care physician.       Attestation  No Additional Attestations                       Maggie Font, MD  11/04/22 1841       Maggie Font, MD  11/04/22 1904       Maggie Font, MD  11/04/22 1906    *Some images could not be shown.

## 2022-11-08 LAB — PANCREATIC ELASTASE, FECAL: Fecal Pancreatic Elastase-1: 35 ug/g — ABNORMAL LOW (ref >=100)

## 2022-11-10 ENCOUNTER — Inpatient Hospital Stay: Admit: 2022-11-10 | Discharge: 2022-11-10 | Payer: PRIVATE HEALTH INSURANCE | Primary: Nurse Practitioner

## 2022-11-10 NOTE — Other (Signed)
[]  [facility] &  Therapy  [address]  [phone]  [phone] []  [facility]  Outpatient Rehabilitation &  Therapy  [address]  P: [phone]  F: [phone] [x]  [facility]  Outpatient Rehabilitation &  Therapy  [address] [place]: [phone]  F: [phone] []  [facility]  Outpatient Rehabilitation &  Therapy  [address] []  [facility]  Outpatient Rehabilitation &  Therapy  [address] [place] [address]   [place]: [phone]  F: [phone]  []  [facility] - [address]  Outpatient Rehabilitation &  Therapy  [address]  P: [phone]  F: [phone] []  [facility]  Outpatient Rehabilitation &  Therapy  [address]  P: [phone]  F: [phone] []  [facility] - [facility] &  Therapy  [address]  [place]: [phone]  F: [phone] []  [facility]  Outpatient Rehabilitation &  Therapy  [address]  [place]: [phone]  F: [phone]  []  [facility] &  Therapy  [address]  [place]: [phone]  F: [phone]     Physical Therapy Daily Treatment Note    Date:  11/10/2022  Patient Name:  Monica Wood    DOB:  1976-05-30  MRN: [id number]  Physician: Levander Campion, APRN - CNP  Insurance: [facility] [place] [facility] after 30 visits  Medical Diagnosis: Cervicalgia                    Rehab Codes: M54.2,  R29.3, M99.01     Onset Date: 08/05/22 script                                        Next Dr.'s appt.: 10/29/22  Visit# / total visits: 6/12     Cancels/No Shows: 0    Subjective:     Pain:  [x]  Yes  []  No Location: neck, head, R UE  Pain Rating: (0-10 scale) 5/10   Pain altered Tx:  [x]  No  []  Yes  Action:  Comments: Patient c/o cervical stiffness and tightness.   Currently left > right.  The pain is in the neck and the back of the head and radiates to the left upper trap region. She reports compliance with her home exercises. Patient missed her last two visits due to illness.  She believes therapy is helping, but she is noting increased tightness today.    Objective:  Modalities:   Precautions:  Manual:  IDN cerv-thoracic paraspinals, UT, lev, suboccipitals and homeostatics in prone. Cerv UT stretch MET. manual traction, occipital release. Bilateral UPA cervical and upper thoracic    Exercises:  Exercise Reps/ Time Weight/ Level Comments   Cerv rot x       UT, lev stretch x                 Post shld rolls x       Scap retraction x            UE tband:   *    Pec stretch  *  Other: Verbal review of home ex    Treatment Charges: Mins Units   []   Modalities     []   Ther Exercise     [x]   Manual Therapy 35 2   []   Ther Activities     []   Neuro Re-ed     []   Vasocompression     []  Gait     []   Other     Total Billable time         Assessment: [x]  Progressing toward goals. Cont 'd w/ DN and manual as detailed above. Pt w/ good tol and no adverse reaction.  Denied pain post treatment.      []  No change.     [x]  Other: Dry needling performed in conjunction with manual therapy to facilitate circulation, promote tissue mobility and reduce pain. No charge submitted for the time the needle was inserted.   [x]  Patient would continue to benefit from skilled physical therapy services in order to: Decrease tightness in cervical paraspinals, suboccipitals am pecs and strengthen scapular stabilizers in order to improve overall postural stabilization and decrease strain on head and neck.       STG: (to be met in 6 treatments)  ? Pain: Decrease pain levels to 2/10 at worst in neck and shoulder- MET  ? ROM: Optimize AROM of cervical spine and bilat UE's  ? Strength: Bilat shoulders grossly 5/5, demonstrate improvements in scapular/postural stability   ? Function: Pt to report improved  sleep and incr ease w/ ADL's, lifting, reading and driving  Patient to be independent with home exercise program as demonstrated by performance with correct form without cues.     LTG: (to be met in 12 treatments)  Pt to report decr frequency and intensity of HA's and UE N&T  2.  Improve score on assessment tools from 30% impaired to 25% impaired, demonstrating an improvement in overall function     Pt goals: no more pain/numbness also, strengthen grip in hand      Pt. Education:  [x]  Yes  []  No  [x]  Reviewed Prior HEP/Ed  Method of Education: [x]  Verbal  []  Demo  []  Written  Comprehension of Education:  [x]  Verbalizes understanding.  []  Demonstrates understanding.  []  Needs review.  []  Demonstrates/verbalizes HEP/Ed previously given.     Plan: [x]  Continue current frequency toward long and short term goals.    [x]  Specific Instructions for subsequent treatments: see above      Time In: 5:05 pm            Time Out: 5:55 pm    Electronically signed by:  Ok Anis, PT

## 2022-11-13 ENCOUNTER — Inpatient Hospital Stay: Payer: PRIVATE HEALTH INSURANCE | Primary: Nurse Practitioner

## 2022-11-13 NOTE — Other (Signed)
[]   [facility] &  Therapy  [address]  [phone]  [phone] []  [facility]  Outpatient Rehabilitation &  Therapy  [address]  P: [phone]  F: [phone] [x]  [facility]  Outpatient Rehabilitation &  Therapy  [address] [place]: [phone]  F: [phone] []  [facility]  Outpatient Rehabilitation &  Therapy  [address] []  [facility]  Outpatient Rehabilitation &  Therapy  [address] [place] [address]   [place]: [phone]  F: [phone]  []  [facility] - [address]  Outpatient Rehabilitation &  Therapy  [address]  P: [phone]  F: [phone] []  [facility] - [facility]  Outpatient Rehabilitation &  Therapy  [address]  P: [phone]  F: [phone] []  [facility] &  Therapy  [address]  [place]: [phone]  F: [phone] []  [facility]  Outpatient Rehabilitation &  Therapy  [address]  [place]: [phone]  F: [phone]  []  [facility] &  Therapy  [address]  [place]: [phone]  F: [phone]     Therapy Cancel/No Show note    Date: 11/13/2022  Patient: Monica Wood  DOB: [birth year]  MRN: [id number]    Cancels/No Shows to date: 2/1    For today's appointment patient:    []   Cancelled    []  Rescheduled appointment    [x]  No-show     Reason given by patient:    []   Patient ill    []   Conflicting appointment    []  No transportation      []  Conflict with work    []  No reason given    []  Weather related    []  COVID-19    []  Other:      Comments:        []  Next appointment was confirmed    Electronically signed by: Ok Anis, PT

## 2022-11-16 ENCOUNTER — Inpatient Hospital Stay
Admission: EM | Admit: 2022-11-16 | Discharge: 2022-11-24 | Disposition: A | Discharge status: Home / Self Care | Payer: PRIVATE HEALTH INSURANCE | Admitting: Surgery

## 2022-11-16 ENCOUNTER — Emergency Department: Admit: 2022-11-16 | Payer: PRIVATE HEALTH INSURANCE | Primary: Nurse Practitioner

## 2022-11-16 DIAGNOSIS — K639 Disease of intestine, unspecified: Principal | ICD-10-CM

## 2022-11-16 DIAGNOSIS — K6389 Other specified diseases of intestine: Secondary | ICD-10-CM

## 2022-11-16 DIAGNOSIS — R109 Unspecified abdominal pain: Secondary | ICD-10-CM

## 2022-11-16 LAB — HEPATIC FUNCTION PANEL
ALT: 14 U/L (ref 5–33)
AST: 16 U/L (ref <32)
Albumin/Globulin Ratio: 1.5 (ref 1.0–2.5)
Albumin: 4.5 g/dL (ref 3.5–5.2)
Alkaline Phosphatase: 120 U/L — ABNORMAL HIGH (ref 35–104)
Bilirubin, Direct: <0.1 mg/dL (ref <0.3)
Total Bilirubin: 0.3 mg/dL (ref 0.3–1.2)
Total Protein: 7.5 g/dL (ref 6.4–8.3)

## 2022-11-16 LAB — URINALYSIS WITH REFLEX TO CULTURE
Bilirubin, Urine: NEGATIVE
Glucose, Ur: NEGATIVE mg/dL
Ketones, Urine: NEGATIVE mg/dL
Leukocyte Esterase, Urine: NEGATIVE
Nitrite, Urine: NEGATIVE
Protein, UA: NEGATIVE mg/dL
Specific Gravity, UA: 1.003 — ABNORMAL LOW (ref 1.005–1.030)
Urine Hgb: NEGATIVE
Urobilinogen, Urine: NORMAL EU/dL (ref 0.0–1.0)
pH, Urine: 6 (ref 5.0–8.0)

## 2022-11-16 LAB — BASIC METABOLIC PANEL
Anion Gap: 8 mmol/L — ABNORMAL LOW (ref 9–17)
BUN: 7 mg/dL (ref 6–20)
CO2: 26 mmol/L (ref 20–31)
Calcium: 10.8 mg/dL — ABNORMAL HIGH (ref 8.6–10.4)
Chloride: 102 mmol/L (ref 98–107)
Creatinine: 0.8 mg/dL (ref 0.5–0.9)
Est, Glom Filt Rate: >90 mL/min/{1.73_m2} (ref >60)
Glucose: 82 mg/dL (ref 70–99)
Potassium: 4 mmol/L (ref 3.7–5.3)
Sodium: 136 mmol/L (ref 135–144)

## 2022-11-16 LAB — CBC WITH AUTO DIFFERENTIAL
Basophils %: 1 % (ref 0–2)
Basophils Absolute: 0.1 10*3/uL (ref 0.0–0.2)
Eosinophils %: 6 % — ABNORMAL HIGH (ref 1–4)
Eosinophils Absolute: 0.4 10*3/uL (ref 0.0–0.4)
Hematocrit: 38.4 % (ref 36–46)
Hemoglobin: 11.9 g/dL — ABNORMAL LOW (ref 12.0–16.0)
Lymphocytes %: 29 % (ref 24–44)
Lymphocytes Absolute: 2.2 10*3/uL (ref 1.0–4.8)
MCH: 22.2 pg — ABNORMAL LOW (ref 26–34)
MCHC: 30.9 g/dL — ABNORMAL LOW (ref 31–37)
MCV: 71.8 fL — ABNORMAL LOW (ref 80–100)
MPV: 7.6 fL (ref 6.0–12.0)
Monocytes %: 11 % (ref 2–11)
Monocytes Absolute: 0.8 10*3/uL (ref 0.1–1.2)
Neutrophils %: 53 % (ref 36–66)
Neutrophils Absolute: 4 10*3/uL (ref 1.8–7.7)
Platelets: 387 10*3/uL (ref 140–450)
RBC: 5.35 m/uL — ABNORMAL HIGH (ref 4.0–5.2)
RDW: 14.1 % (ref 12.5–15.4)
WBC: 7.5 10*3/uL (ref 3.5–11.0)

## 2022-11-16 LAB — HCG, SERUM, QUALITATIVE: Preg, Serum: NEGATIVE

## 2022-11-16 LAB — LACTIC ACID: Lactic Acid: 1.2 mmol/L (ref 0.5–2.2)

## 2022-11-16 LAB — LIPASE: Lipase: 24 U/L (ref 13–60)

## 2022-11-16 MED ORDER — FENTANYL CITRATE (PF) 100 MCG/2ML IJ SOLN
100 | Freq: Once | INTRAMUSCULAR | Status: AC
Start: 2022-11-16 — End: 2022-11-16
  Administered 2022-11-16: 19:00:00 50 ug via INTRAVENOUS

## 2022-11-16 MED ORDER — ONDANSETRON HCL 4 MG/2ML IJ SOLN
42 MG/2ML | Freq: Four times a day (QID) | INTRAMUSCULAR | Status: AC | PRN
Start: 2022-11-16 — End: 2022-11-24

## 2022-11-16 MED ORDER — SODIUM CHLORIDE 0.9 % IV BOLUS
0.9 | Freq: Once | INTRAVENOUS | Status: DC
Start: 2022-11-16 — End: 2022-11-16
  Administered 2022-11-16: 16:00:00 1000 mL via INTRAVENOUS

## 2022-11-16 MED ORDER — NORMAL SALINE FLUSH 0.9 % IV SOLN
0.9 % | INTRAVENOUS | Status: AC | PRN
Start: 2022-11-16 — End: 2022-11-24
  Administered 2022-11-21 – 2022-11-22 (×5): 10 mL via INTRAVENOUS

## 2022-11-16 MED ORDER — ONDANSETRON 4 MG PO TBDP
4 MG | Freq: Three times a day (TID) | ORAL | Status: AC | PRN
Start: 2022-11-16 — End: 2022-11-24

## 2022-11-16 MED ORDER — POLYETHYLENE GLYCOL 3350 17 G PO PACK
17 g | Freq: Every day | ORAL | Status: AC | PRN
Start: 2022-11-16 — End: 2022-11-24

## 2022-11-16 MED ORDER — POTASSIUM BICARB-CITRIC ACID 20 MEQ PO TBEF
20 MEQ | ORAL | Status: AC | PRN
Start: 2022-11-16 — End: 2022-11-24

## 2022-11-16 MED ORDER — SODIUM CHLORIDE 0.9 % IV BOLUS
0.9 | Freq: Once | INTRAVENOUS | Status: DC
Start: 2022-11-16 — End: 2022-11-16

## 2022-11-16 MED ORDER — HYDROMORPHONE HCL 1 MG/ML IJ SOLN
1 | INTRAMUSCULAR | Status: DC | PRN
Start: 2022-11-16 — End: 2022-11-16
  Administered 2022-11-16: 23:00:00 0.25 mg via INTRAVENOUS

## 2022-11-16 MED ORDER — ACETAMINOPHEN 650 MG RE SUPP
650 | Freq: Four times a day (QID) | RECTAL | Status: DC | PRN
Start: 2022-11-16 — End: 2022-11-23

## 2022-11-16 MED ORDER — POTASSIUM CHLORIDE CRYS ER 20 MEQ PO TBCR
20 MEQ | ORAL | Status: AC | PRN
Start: 2022-11-16 — End: 2022-11-24

## 2022-11-16 MED ORDER — ACETAMINOPHEN 325 MG PO TABS
325 | Freq: Four times a day (QID) | ORAL | Status: DC | PRN
Start: 2022-11-16 — End: 2022-11-19

## 2022-11-16 MED ORDER — IOPAMIDOL 76 % IV SOLN
76 | Freq: Once | INTRAVENOUS | Status: AC | PRN
Start: 2022-11-16 — End: 2022-11-16
  Administered 2022-11-16: 18:00:00 75 mL via INTRAVENOUS

## 2022-11-16 MED ORDER — POTASSIUM CHLORIDE 10 MEQ/100ML IV SOLN
10100 MEQ/0ML | INTRAVENOUS | Status: AC | PRN
Start: 2022-11-16 — End: 2022-11-24

## 2022-11-16 MED ORDER — SODIUM CHLORIDE 0.9 % IV SOLN
0.9 % | INTRAVENOUS | Status: AC
Start: 2022-11-16 — End: 2022-11-18
  Administered 2022-11-16 – 2022-11-18 (×2): via INTRAVENOUS

## 2022-11-16 MED ORDER — NORMAL SALINE FLUSH 0.9 % IV SOLN
0.9 | INTRAVENOUS | Status: DC | PRN
Start: 2022-11-16 — End: 2022-11-16
  Administered 2022-11-16: 18:00:00 10 mL via INTRAVENOUS

## 2022-11-16 MED ORDER — PANTOPRAZOLE SODIUM 40 MG IV SOLR
40 | Freq: Every day | INTRAVENOUS | Status: DC
Start: 2022-11-16 — End: 2022-11-24
  Administered 2022-11-17 – 2022-11-24 (×9): 40 mg via INTRAVENOUS

## 2022-11-16 MED ORDER — SODIUM CHLORIDE 0.9 % IV SOLN
0.9 % | INTRAVENOUS | Status: AC | PRN
Start: 2022-11-16 — End: 2022-11-24

## 2022-11-16 MED ORDER — NORMAL SALINE FLUSH 0.9 % IV SOLN
0.9 % | Freq: Two times a day (BID) | INTRAVENOUS | Status: AC
Start: 2022-11-16 — End: 2022-11-24
  Administered 2022-11-17 – 2022-11-19 (×4): 10 mL via INTRAVENOUS
  Administered 2022-11-20: 01:00:00 5 mL via INTRAVENOUS
  Administered 2022-11-20 – 2022-11-24 (×9): 10 mL via INTRAVENOUS

## 2022-11-16 MED ORDER — MAGNESIUM SULFATE 2000 MG/50 ML IVPB PREMIX
250 GM/50ML | INTRAVENOUS | Status: AC | PRN
Start: 2022-11-16 — End: 2022-11-24

## 2022-11-16 MED ORDER — ONDANSETRON HCL 4 MG/2ML IJ SOLN
4 | Freq: Once | INTRAMUSCULAR | Status: AC
Start: 2022-11-16 — End: 2022-11-16
  Administered 2022-11-16: 19:00:00 4 mg via INTRAVENOUS

## 2022-11-16 MED ORDER — ENOXAPARIN SODIUM 40 MG/0.4ML IJ SOSY
400.4 MG/0.4ML | Freq: Every day | INTRAMUSCULAR | Status: DC
Start: 2022-11-16 — End: 2022-11-24
  Administered 2022-11-17 – 2022-11-23 (×5): 40 mg via SUBCUTANEOUS

## 2022-11-16 MED FILL — ONDANSETRON HCL 4 MG/2ML IJ SOLN: 4 MG/2ML | INTRAMUSCULAR | Qty: 2

## 2022-11-16 MED FILL — DILAUDID 1 MG/ML IJ SOLN: 1 MG/ML | INTRAMUSCULAR | Qty: 0.5

## 2022-11-16 MED FILL — FENTANYL CITRATE (PF) 100 MCG/2ML IJ SOLN: 100 MCG/2ML | INTRAMUSCULAR | Qty: 2

## 2022-11-16 NOTE — Plan of Care (Signed)
Problem: Discharge Planning  Goal: Discharge to home or other facility with appropriate resources  Outcome: Progressing  Flowsheets (Taken 11/16/2022 2231)  Discharge to home or other facility with appropriate resources:   Identify barriers to discharge with patient and caregiver   Arrange for needed discharge resources and transportation as appropriate   Identify discharge learning needs (meds, wound care, etc)     Problem: Pain  Goal: Verbalizes/displays adequate comfort level or baseline comfort level  Outcome: Progressing  Flowsheets (Taken 11/16/2022 2231)  Verbalizes/displays adequate comfort level or baseline comfort level:   Encourage patient to monitor pain and request assistance   Assess pain using appropriate pain scale   Administer analgesics based on type and severity of pain and evaluate response   Implement non-pharmacological measures as appropriate and evaluate response     Problem: Safety - Adult  Goal: Free from fall injury  Outcome: Progressing

## 2022-11-16 NOTE — ED Provider Notes (Signed)
[facility] Emergency Department  [phone] [facility] [address]  [facility] [place] [address]  Phone: [phone]  Fax: [phone]        [facility] EMERGENCY DEPARTMENT  EMERGENCY DEPARTMENT ENCOUNTER      Pt Name: Monica Wood  MRN: [id number]  Birthdate 04/11/1976  Date of evaluation: 11/16/2022  Provider: Rennie Plowman, MD    CHIEF COMPLAINT       Chief Complaint   Patient presents with    Abdominal Pain     RUQ abdominal pains.  Onset couple days ago.  Recent diagnosis of "pancreatic anomally" and other GI issues.          HISTORY OF PRESENT ILLNESS   (Location/Symptom, Timing/Onset,Context/Setting, Quality, Duration, Modifying Factors, Severity)  Note limiting factors.   Monica Wood is a 47 y.o. female who presents to the emergency department with complaints of right lower quadrant abdominal pain.  She does have a history of multiple surgeries to her abdomen including a cholecystectomy and abdominoplasty.  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  She also complains of some nausea and vomiting.  She denies any fever or chills.  She denies any diarrhea.  Nursing Notes were reviewed.    REVIEW OF SYSTEMS    (2-9systems for level 4, 10 or more for level 5)     Review of Systems   Gastrointestinal:  Positive for abdominal pain, nausea and vomiting.       Except as noted above the remainder of the review of systems was reviewed and negative.       PAST MEDICAL HISTORY     Past Medical History:   Diagnosis Date    Bursitis     Fibromyalgia     Scoliosis     Tendinitis          SURGICAL HISTORY       Past Surgical History:   Procedure Laterality Date    ABDOMINOPLASTY      ESOPHAGOGASTRIC FUNDOPLICATION  2000    GALLBLADDER SURGERY      TUBAL LIGATION           CURRENT MEDICATIONS     Previous Medications    ALBUTEROL SULFATE HFA (PROVENTIL HFA) 108 (90 BASE) MCG/ACT INHALER    Inhale 2 puffs into  the lungs every 6 hours as needed for Wheezing or Shortness of Breath    BUPROPION (WELLBUTRIN XL) 150 MG EXTENDED RELEASE TABLET    Take 1 tablet by mouth every morning    DULOXETINE (CYMBALTA) 30 MG EXTENDED RELEASE CAPSULE    Take 1 capsule by mouth daily    DULOXETINE (CYMBALTA) 60 MG EXTENDED RELEASE CAPSULE    take 1 capsule by mouth once daily    FAMOTIDINE (PEPCID) 40 MG TABLET    Take 1 tablet by mouth every evening    TIZANIDINE (ZANAFLEX) 2 MG TABLET    Take 1 tablet by mouth daily as needed (back muscle)    TRAZODONE HCL PO    Take by mouth       ALLERGIES     Parsley leaves, Codeine, and Morphine    FAMILY HISTORY       Family History   Problem Relation Age of Onset    Diabetes Mother     Diabetes Maternal Grandmother           SOCIAL HISTORY  Social History     Socioeconomic History    Marital status: Married   Tobacco Use    Smoking status: Never    Smokeless tobacco: Never   Substance and Sexual Activity    Alcohol use: Yes     Comment: 3/4 times a week    Drug use: Never     Social Determinants of Health     Financial Resource Strain: Low Risk  (02/19/2022)    Overall Financial Resource Strain (CARDIA)     Difficulty of Paying Living Expenses: Not hard at all   Transportation Needs: Unknown (02/19/2022)    PRAPARE - Transportation     Lack of Transportation (Non-Medical): No   Physical Activity: Insufficiently Active (02/16/2022)    Exercise Vital Sign     Days of Exercise per Week: 4 days     Minutes of Exercise per Session: 30 min   Intimate Partner Violence: Not At Risk (02/16/2022)    Humiliation, Afraid, Rape, and Kick questionnaire     Fear of Current or Ex-Partner: No     Emotionally Abused: No     Physically Abused: No     Sexually Abused: No   Housing Stability: Unknown (02/19/2022)    Housing Stability Vital Sign     Unstable Housing in the Last Year: No       SCREENINGS    Glasgow Coma Scale  Eye Opening: Spontaneous  Best Verbal Response: Oriented  Best Motor Response: Obeys commands  [place]  Coma Scale Score: 15        PHYSICAL EXAM    (up to 7 for level 4, 8 or more for level 5)     ED Triage Vitals [11/16/22 1137]   BP Temp Temp Source Pulse Respirations SpO2 Height Weight - Scale   108/63 97.7 F (36.5 C) Oral 60 18 99 % 1.65 m (5' 4.96") 100 kg (220 lb 7.4 oz)       Physical Exam  Vitals reviewed.   Constitutional:       Appearance: Normal appearance.   HENT:      Head: Normocephalic and atraumatic.   Eyes:      Extraocular Movements: Extraocular movements intact.   Pulmonary:      Effort: Pulmonary effort is normal.   Abdominal:      Tenderness: There is generalized abdominal tenderness.   Musculoskeletal:      Cervical back: Normal range of motion and neck supple.   Neurological:      General: No focal deficit present.      Mental Status: She is alert and oriented to person, place, and time.   Psychiatric:         Mood and Affect: Mood normal.         Behavior: Behavior normal.         EMERGENCY DEPARTMENT COURSE and DIFFERENTIAL DIAGNOSIS/MDM:   Vitals:    Vitals:    11/16/22 1137   BP: 108/63   Pulse: 60   Resp: 18   Temp: 97.7 F (36.5 C)   TempSrc: Oral   SpO2: 99%   Weight: 100 kg (220 lb 7.4 oz)   Height: 1.65 m (5' 4.96")   Differential diagnosis considered: Abdominal pain, pancreatitis, appendicitis, bowel obstruction, gastritis.appnew        Chronic Conditions affecting care (DM,HTN,CA, etc): Obesity, depression, osteoarthritis, chronic right upper quadrant pain    Social Determinants of Health affecting care (unable to care for self, lives alone, unemployed, homeless,etc): None  History source(s) (patient,spouse,parent,family,friend,EMS,etc): Patient    Review of external sources (ECF,Hospital records,EMS report, radiology reports, etc): Previous MRI report of abdomen as well as lab work and GI consult    Tests considered but not ordered: Not applicable    Independent interpretation of tests (eg.  X-ray, CAT scan, Doppler studies, EKG): No    Discussion of x-ray results with radiology:  No    Consults: General surgery Dr. Christy Gentles    Consideration for admission/observation (even if discharged): 47 year old female comes in today with right-sided abdominal pain in between the right upper quadrant and right lower quadrant.  Tender to palpation.  CT scan of the abdomen pelvis showing concerns of a soft tissue mass.  General surgery has been consulted and will evaluate the patient in the emergency department and decide final disposition.    Prescription considerations: None    Sepsis considered: Not applicable    Critical Care note written: None      ED course: Patient was seen and examined.  An IV was established.  She was given 1 L of normal saline.  Lab work was checked and unremarkable.  UA was unremarkable.  CT scan of the abdomen pelvis was done.  It did show concerns of a soft tissue mass.  General surgery was consulted.  They will be evaluating the patient in the emergency department.  At this point patient was signed out to Dr. Sherlon Handing.  Final disposition will be determined after general surgery consult.    DIAGNOSTIC RESULTS     LABS:  Labs Reviewed   CBC WITH AUTO DIFFERENTIAL - Abnormal; Notable for the following components:       Result Value    RBC 5.35 (*)     Hemoglobin 11.9 (*)     MCV 71.8 (*)     MCH 22.2 (*)     MCHC 30.9 (*)     Eosinophils % 6 (*)     All other components within normal limits   BASIC METABOLIC PANEL - Abnormal; Notable for the following components:    Anion Gap 8 (*)     Calcium 10.8 (*)     All other components within normal limits   HEPATIC FUNCTION PANEL - Abnormal; Notable for the following components:    Alkaline Phosphatase 120 (*)     All other components within normal limits   URINALYSIS WITH REFLEX TO CULTURE - Abnormal; Notable for the following components:    Specific Gravity, UA 1.003 (*)     All other components within normal limits   LIPASE   LACTIC ACID   HCG, SERUM, QUALITATIVE       All other labs were within normal range or not returned as of  this dictation.    RADIOLOGY:  CT ABDOMEN PELVIS W IV CONTRAST Additional Contrast? None   Final Result   No acute intra-abdominal or pelvic abnormality      Soft tissue mass in the ventral mesentery anterior to the aorta, not   confidently communicating with small bowel.  Findings are concerning for   neoplasm.  Enlarged lymph node with necrosis, peritoneal implant, carcinoid   tumor or other neoplasm not excluded.  Recommend correlation with outpatient   PET-CT.                      PROCEDURES:  Unless otherwise noted below, none     Procedures    FINAL IMPRESSION      1. Abdominal pain,  unspecified abdominal location          DISPOSITION/PLAN   DISPOSITION        PATIENT REFERRED TO:  No follow-up provider specified.    DISCHARGE MEDICATIONS:  New Prescriptions    No medications on file          (Please note that portions of this note were completed with a voice recognition program.  Efforts were made to edit the dictations but occasionally words are mis-transcribed.)    Rennie Plowman, MD,(electronically signed)  Board Certified Emergency Physician            Ashraf-Moghal, Roosevelt Locks, MD  11/16/22 1520

## 2022-11-16 NOTE — Telephone Encounter (Signed)
Location of patient: [place]    Subjective: Caller states "I am having pain on the right side of my stomach"     Current Symptoms: right sided upper abdominal pain radiating into the back. Nausea    Onset: 1 day ago; worsening    Associated Symptoms: reduced activity, increased wakefulness    Pain Severity: 9/10; sharp; constant    Temperature: no     What has been tried: nothing    LMP: NA Pregnant: NA    Recommended disposition: Go to ED Now    Care advice provided, patient verbalizes understanding; denies any other questions or concerns; instructed to call back for any new or worsening symptoms.    Patient/caller agrees to proceed to the Emergency Department    Attention Provider:  Thank you for allowing me to participate in the care of your patient.  The patient was connected to triage in response to symptoms provided.   Please do not respond through this encounter as the response is not directed to a shared pool.      Reason for Disposition   [1] SEVERE pain (e.g., excruciating) AND [2] present > 1 hour    Protocols used: Abdominal Pain - Upper-ADULT-AH

## 2022-11-16 NOTE — ED Notes (Signed)
Attempted to give report, was told nurse would give a call back.     Electronically signed by Valentino Saxon, RN on 11/16/2022 at 6:22 PM

## 2022-11-16 NOTE — ED Provider Notes (Signed)
ADDENDUM:        The patient was seen for Abdominal Pain (RUQ abdominal pains.  Onset couple days ago.  Recent diagnosis of "pancreatic anomally" and other GI issues. )  .  The patient's initial evaluation and plan have been discussed with the prior provider who initially evaluated the patient.  Nursing Notes, Past Medical Hx, Past Surgical Hx, Social Hx, Allergies, and Family Hx were all reviewed.    PAST MEDICAL HISTORY    has a past medical history of Bursitis, Fibromyalgia, Scoliosis, and Tendinitis.    SURGICAL HISTORY      has a past surgical history that includes Gallbladder surgery; Abdominoplasty; Tubal ligation; and Esophagogastric fundoplication (2000).    CURRENT MEDICATIONS       Previous Medications    ALBUTEROL SULFATE HFA (PROVENTIL HFA) 108 (90 BASE) MCG/ACT INHALER    Inhale 2 puffs into the lungs every 6 hours as needed for Wheezing or Shortness of Breath    BUPROPION (WELLBUTRIN XL) 150 MG EXTENDED RELEASE TABLET    Take 1 tablet by mouth every morning    DULOXETINE (CYMBALTA) 30 MG EXTENDED RELEASE CAPSULE    Take 1 capsule by mouth daily    DULOXETINE (CYMBALTA) 60 MG EXTENDED RELEASE CAPSULE    take 1 capsule by mouth once daily    FAMOTIDINE (PEPCID) 40 MG TABLET    Take 1 tablet by mouth every evening    TIZANIDINE (ZANAFLEX) 2 MG TABLET    Take 1 tablet by mouth daily as needed (back muscle)    TRAZODONE HCL PO    Take by mouth       ALLERGIES     is allergic to parsley leaves, codeine, and morphine.    FAMILY HISTORY     She indicated that the status of her mother is unknown. She indicated that the status of her maternal grandmother is unknown.     family history includes Diabetes in her maternal grandmother and mother.    SOCIAL HISTORY      reports that she has never smoked. She has never used smokeless tobacco. She reports current alcohol use. She reports that she does not use drugs.    Diagnostic Results     EKG         LABS:   Results for orders placed or performed during the  hospital encounter of 11/16/22   CBC with Auto Differential   Result Value Ref Range    WBC 7.5 3.5 - 11.0 k/uL    RBC 5.35 (H) 4.0 - 5.2 m/uL    Hemoglobin 11.9 (L) 12.0 - 16.0 g/dL    Hematocrit 16.1 36 - 46 %    MCV 71.8 (L) 80 - 100 fL    MCH 22.2 (L) 26 - 34 pg    MCHC 30.9 (L) 31 - 37 g/dL    RDW 09.6 04.5 - 40.9 %    Platelets 387 140 - 450 k/uL    MPV 7.6 6.0 - 12.0 fL    Neutrophils % 53 36 - 66 %    Lymphocytes % 29 24 - 44 %    Monocytes % 11 2 - 11 %    Eosinophils % 6 (H) 1 - 4 %    Basophils % 1 0 - 2 %    Neutrophils Absolute 4.00 1.8 - 7.7 k/uL    Lymphocytes Absolute 2.20 1.0 - 4.8 k/uL    Monocytes Absolute 0.80 0.1 - 1.2 k/uL    Eosinophils  Absolute 0.40 0.0 - 0.4 k/uL    Basophils Absolute 0.10 0.0 - 0.2 k/uL   BMP   Result Value Ref Range    Sodium 136 135 - 144 mmol/L    Potassium 4.0 3.7 - 5.3 mmol/L    Chloride 102 98 - 107 mmol/L    CO2 26 20 - 31 mmol/L    Anion Gap 8 (L) 9 - 17 mmol/L    Glucose 82 70 - 99 mg/dL    BUN 7 6 - 20 mg/dL    Creatinine 0.8 0.5 - 0.9 mg/dL    Est, Glom Filt Rate >90 >60 mL/min/1.15m2    Calcium 10.8 (H) 8.6 - 10.4 mg/dL   Lipase   Result Value Ref Range    Lipase 24 13 - 60 U/L   Lactic Acid   Result Value Ref Range    Lactic Acid 1.2 0.5 - 2.2 mmol/L   Hepatic Function Panel   Result Value Ref Range    Albumin 4.5 3.5 - 5.2 g/dL    Alkaline Phosphatase 120 (H) 35 - 104 U/L    ALT 14 5 - 33 U/L    AST 16 <32 U/L    Total Bilirubin 0.3 0.3 - 1.2 mg/dL    Bilirubin, Direct <1.6 <0.3 mg/dL    Bilirubin, Indirect Can not be calculated 0.0 - 1.0 mg/dL    Total Protein 7.5 6.4 - 8.3 g/dL    Albumin/Globulin Ratio 1.5 1.0 - 2.5   HCG Qualitative, Serum   Result Value Ref Range    hCG Qual NEGATIVE NEGATIVE   Urinalysis with Reflex to Culture    Specimen: Urine   Result Value Ref Range    Color, UA Yellow Yellow    Turbidity UA Clear Clear    Glucose, Ur NEGATIVE NEGATIVE mg/dL    Bilirubin, Urine NEGATIVE NEGATIVE    Ketones, Urine NEGATIVE NEGATIVE mg/dL    Specific  Gravity, UA 1.003 (L) 1.005 - 1.030    Urine Hgb NEGATIVE NEGATIVE    pH, Urine 6.0 5.0 - 8.0    Protein, UA NEGATIVE NEGATIVE mg/dL    Urobilinogen, Urine Normal 0.0 - 1.0 EU/dL    Nitrite, Urine NEGATIVE NEGATIVE    Leukocyte Esterase, Urine NEGATIVE NEGATIVE    Comment       Microscopic exam not performed based on chemical results unless requested in original order.    Comment          Comment       Utilizing a urinalysis as the only screening method to exclude a potential uropathogen can be unreliable in many patient populations.  Rapid screening tests are less sensitive than culture and if UTI is a clinical possibility, culture should be considered despite a negative urinalysis.         RADIOLOGY:  CT ABDOMEN PELVIS W IV CONTRAST Additional Contrast? None   Final Result   No acute intra-abdominal or pelvic abnormality      Soft tissue mass in the ventral mesentery anterior to the aorta, not   confidently communicating with small bowel.  Findings are concerning for   neoplasm.  Enlarged lymph node with necrosis, peritoneal implant, carcinoid   tumor or other neoplasm not excluded.  Recommend correlation with outpatient   PET-CT.               ED Course     The patient was given the following medications:  Orders Placed This Encounter   Medications  DISCONTD: sodium chloride 0.9 % bolus 1,000 mL    ondansetron (ZOFRAN) injection 4 mg    DISCONTD: sodium chloride 0.9 % bolus 80 mL    iopamidol (ISOVUE-370) 76 % injection 75 mL    DISCONTD: sodium chloride flush 0.9 % injection 10 mL    fentaNYL (SUBLIMAZE) injection 50 mcg    sodium chloride flush 0.9 % injection 5-40 mL    sodium chloride flush 0.9 % injection 5-40 mL    0.9 % sodium chloride infusion    OR Linked Order Group     potassium chloride (KLOR-CON M) extended release tablet 40 mEq     potassium bicarb-citric acid (EFFER-K) effervescent tablet 40 mEq     potassium chloride 10 mEq/100 mL IVPB (Peripheral Line)    magnesium sulfate 2000 mg in 50 mL IVPB  premix    enoxaparin (LOVENOX) injection 40 mg     Order Specific Question:   Indication of Use     Answer:   Prophylaxis-DVT/PE    OR Linked Order Group     ondansetron (ZOFRAN-ODT) disintegrating tablet 4 mg     ondansetron (ZOFRAN) injection 4 mg    polyethylene glycol (GLYCOLAX) packet 17 g    OR Linked Order Group     acetaminophen (TYLENOL) tablet 650 mg     acetaminophen (TYLENOL) suppository 650 mg    0.9 % sodium chloride infusion    potassium chloride 10 mEq/100 mL IVPB (Peripheral Line)    HYDROmorphone (DILAUDID) injection 0.25 mg    pantoprazole (PROTONIX) 40 mg in sodium chloride (PF) 0.9 % 10 mL injection         RECENT VITALS:  BP 108/63   Pulse 60   Temp 97.7 F (36.5 C) (Oral)   Resp 18   Ht 1.65 m (5' 4.96")   Wt 100 kg (220 lb 7.4 oz)   SpO2 99%   BMI 36.73 kg/m     Patient was signed out to me by Dr. Loanne Drilling awaiting Dr. Cliffton Asters to come and evaluate the patient.  Dr. Cliffton Asters has seen and evaluated the patient has discussed the CT scan results and new findings which are new from the recent  MRI.  She will admit the patient and manage the patient with also consulting GI.    Disposition     CLINICAL IMPRESSION:  1. Abdominal pain, unspecified abdominal location    2. Intraabdominal mass        PATIENT REFERRED TO:  No follow-up provider specified.    DISCHARGE MEDICATIONS:  New Prescriptions    No medications on file       DISPOSITION:   DISPOSITION Admitted 11/16/2022 04:40:48 PM      (Please note that portions of this note were completed with a voice recognition program.  Efforts were made to edit the dictations but occasionally words are mis-transcribed.  Additionally, portions of this note may also include information that was incorporated after care transfer to another provider that were not available at the time of my evaluation.  Some of this information could likely include laboratory values, vital sign updates, medications etc.)    Lorelle Gibbs, DO         Lorelle Gibbs,  DO  11/16/22 1643

## 2022-11-16 NOTE — ED Notes (Signed)
House supervisor advised to give report after shift change.     Electronically signed by Valentino Saxon, RN on 11/16/2022 at 6:48 PM

## 2022-11-16 NOTE — H&P (Signed)
Monica Wood is an 47 y.o. African American female.who presents with approx 5 months of postprandial RUQ abdominal pain after eating solids.  This occurs fairly quickly after diet. It does not occur with liquids and she ahs not noticed any specific food tuype that sets this off.  She denies much of any other symptoms including N/V/ F/C  but some excessive gassiness both upper and lower.   The patient many years ago had both lap chole and lap Nissen.  She has not lost any weight.  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  She has been seen by Dr. Othelia Pulling of GI recently and was scheduled for outpateint EGD and colonoscopy in about 2 weeks.    She had CT scan today and this shows a mass near the root of her mesentery with some small amount of central pus or necrosis and I do not appreciate this on the MRI a few months ago.     Past Medical History:   Diagnosis Date    Bursitis     Fibromyalgia     Scoliosis     Tendinitis        Allergies:   Allergies   Allergen Reactions    Parsley Leaves Anaphylaxis, Hives, Itching and Shortness Of Breath    Codeine      Anaphylaxis     Morphine        Principal Problem:    Abdominal pain  Active Problems:    Abnormal radiologic density  Resolved Problems:    * No resolved hospital problems. *    Blood pressure 108/63, pulse 60, temperature 97.7 F (36.5 C), temperature source Oral, resp. rate 18, height 1.65 m (5' 4.96"), weight 100 kg (220 lb 7.4 oz), SpO2 99 %.    Review of Systems  Denies fever, chills, weight loss or any other B class symptoms.     Physical Exam  Slightly overweight B F in NAD except during abdominal exam.   No jaundice  Good lung excursion  Regular  Abdomen soft and non distended. Multiple small old laparoscopic scars. Mild=mod RUQ discomfort to palpation. NO periumbilical or just below umbilicus pain at site of CT findings but this is  fairly deep.       Assessment:  Postprandial RUQ pain   Will ask GI to see pateint and see if they would wish to move up endoscopies while she is here.   Mesenteric mass.    Plan:  Tried to call IR but in town IR not available until tomorrow AM to see if they think any window available to do a needle biopsy.  Note radiology suggests PET but likely will still need tissue for diagnosis and this may come to exploration.     Lucious Groves, MD  11/16/2022

## 2022-11-17 LAB — COMPREHENSIVE METABOLIC PANEL W/ REFLEX TO MG FOR LOW K
ALT: 11 U/L (ref 5–33)
AST: 12 U/L (ref <32)
Albumin/Globulin Ratio: 1.2 (ref 1.0–2.5)
Albumin: 3.6 g/dL (ref 3.5–5.2)
Alkaline Phosphatase: 100 U/L (ref 35–104)
Anion Gap: 7 mmol/L — ABNORMAL LOW (ref 9–17)
BUN: 7 mg/dL (ref 6–20)
CO2: 23 mmol/L (ref 20–31)
Calcium: 9.6 mg/dL (ref 8.6–10.4)
Chloride: 108 mmol/L — ABNORMAL HIGH (ref 98–107)
Creatinine: 0.7 mg/dL (ref 0.5–0.9)
Est, Glom Filt Rate: >90 mL/min/{1.73_m2} (ref >60)
Glucose: 99 mg/dL (ref 70–99)
Potassium: 4.2 mmol/L (ref 3.7–5.3)
Sodium: 138 mmol/L (ref 135–144)
Total Bilirubin: 0.3 mg/dL (ref 0.3–1.2)
Total Protein: 6.5 g/dL (ref 6.4–8.3)

## 2022-11-17 LAB — CBC WITH AUTO DIFFERENTIAL
Basophils %: 1 % (ref 0–2)
Basophils Absolute: 0.1 10*3/uL (ref 0.0–0.2)
Eosinophils %: 5 % — ABNORMAL HIGH (ref 1–4)
Eosinophils Absolute: 0.3 10*3/uL (ref 0.0–0.4)
Hematocrit: 33.5 % — ABNORMAL LOW (ref 36–46)
Hemoglobin: 10.5 g/dL — ABNORMAL LOW (ref 12.0–16.0)
Lymphocytes %: 27 % (ref 24–44)
Lymphocytes Absolute: 1.7 10*3/uL (ref 1.0–4.8)
MCH: 22.9 pg — ABNORMAL LOW (ref 26–34)
MCHC: 31.5 g/dL (ref 31–37)
MCV: 72.5 fL — ABNORMAL LOW (ref 80–100)
MPV: 7.7 fL (ref 6.0–12.0)
Monocytes %: 12 % — ABNORMAL HIGH (ref 2–11)
Monocytes Absolute: 0.8 10*3/uL (ref 0.1–1.2)
Neutrophils %: 55 % (ref 36–66)
Neutrophils Absolute: 3.6 10*3/uL (ref 1.8–7.7)
Platelets: 310 10*3/uL (ref 140–450)
RBC: 4.62 m/uL (ref 4.0–5.2)
RDW: 13.7 % (ref 12.5–15.4)
WBC: 6.4 10*3/uL (ref 3.5–11.0)

## 2022-11-17 LAB — CANCER ANTIGEN 19-9: CA 19-9: 7 U/mL (ref 0–35)

## 2022-11-17 LAB — CEA: CEA: 1 ng/mL (ref 0.0–3.8)

## 2022-11-17 LAB — CA 125: CA 125: 14 U/mL (ref 0–38)

## 2022-11-17 MED ORDER — TIZANIDINE HCL 4 MG PO TABS
4 MG | Freq: Every day | ORAL | Status: AC | PRN
Start: 2022-11-17 — End: 2022-11-24
  Administered 2022-11-18 – 2022-11-24 (×5): 2 mg via ORAL

## 2022-11-17 MED ORDER — BISACODYL 5 MG PO TBEC
5 | Freq: Once | ORAL | Status: AC
Start: 2022-11-17 — End: 2022-11-17
  Administered 2022-11-17: 21:00:00 10 mg via ORAL

## 2022-11-17 MED ORDER — HYDROMORPHONE HCL 1 MG/ML IJ SOLN
1 | INTRAMUSCULAR | Status: DC | PRN
Start: 2022-11-17 — End: 2022-11-20
  Administered 2022-11-17 – 2022-11-20 (×14): 0.5 mg via INTRAVENOUS

## 2022-11-17 MED ORDER — BUPROPION HCL ER (XL) 150 MG PO TB24
150 MG | Freq: Every morning | ORAL | Status: AC
Start: 2022-11-17 — End: 2022-11-24
  Administered 2022-11-17 – 2022-11-24 (×7): 150 mg via ORAL

## 2022-11-17 MED ORDER — PEG 3350-KCL-NABCB-NACL-NASULF 236 G PO SOLR
236 | Freq: Once | ORAL | Status: AC
Start: 2022-11-17 — End: 2022-11-17
  Administered 2022-11-17: 21:00:00 4000 mL via ORAL

## 2022-11-17 MED ORDER — HYDROMORPHONE HCL 1 MG/ML IJ SOLN
1 MG/ML | INTRAMUSCULAR | Status: DC | PRN
Start: 2022-11-17 — End: 2022-11-17
  Administered 2022-11-17 (×3): 0.25 mg via INTRAVENOUS

## 2022-11-17 MED ORDER — DULOXETINE HCL 30 MG PO CPEP
30 MG | Freq: Every day | ORAL | Status: AC
Start: 2022-11-17 — End: 2022-11-23
  Administered 2022-11-17 – 2022-11-22 (×6): 30 mg via ORAL

## 2022-11-17 MED ORDER — ALBUTEROL SULFATE HFA 108 (90 BASE) MCG/ACT IN AERS
10890 (90 Base) MCG/ACT | Freq: Four times a day (QID) | RESPIRATORY_TRACT | Status: AC | PRN
Start: 2022-11-17 — End: 2022-11-24

## 2022-11-17 MED FILL — BUPROPION HCL ER (XL) 150 MG PO TB24: 150 MG | ORAL | Qty: 1

## 2022-11-17 MED FILL — GOLYTELY 236 G PO SOLR: 236 g | ORAL | Qty: 4000

## 2022-11-17 MED FILL — DILAUDID 1 MG/ML IJ SOLN: 1 MG/ML | INTRAMUSCULAR | Qty: 0.5

## 2022-11-17 MED FILL — DULOXETINE HCL 30 MG PO CPEP: 30 MG | ORAL | Qty: 1

## 2022-11-17 MED FILL — PANTOPRAZOLE SODIUM 40 MG IV SOLR: 40 MG | INTRAVENOUS | Qty: 40

## 2022-11-17 MED FILL — ENOXAPARIN SODIUM 40 MG/0.4ML IJ SOSY: 40 MG/0.4ML | INTRAMUSCULAR | Qty: 0.4

## 2022-11-17 MED FILL — STIMULANT LAXATIVE 5 MG PO TBEC: 5 MG | ORAL | Qty: 2

## 2022-11-17 NOTE — Consults (Signed)
[facility]    GASTROENTEROLOGY CONSULT    Patient:   Monica Wood   DOB:    1976/01/10   Facility:   [facility]  Date:    11/17/2022  Admission Dx:  Abdominal pain [R10.9]  Intraabdominal mass [R19.00]  Abdominal pain, unspecified abdominal location [R10.9]  Requesting physician: Lucious Groves, MD  Reason for consult:  Abdominal pain      SUBJECTIVE:  History of Present Illness:  This is a 47 y.o.   female who was admitted 11/16/2022 with Abdominal pain [R10.9]  Intraabdominal mass [R19.00]  Abdominal pain, unspecified abdominal location [R10.9]. We have been asked to see the patient in consultation by Lucious Groves, MD for  abdominal pain. This is a 47 year old female with pmh of nissen fundoplication fibromyalgia scoliosis cholecystectomy who presented to the ED for a 5 month hx of postprandial RUQ pain with radiation to the back after eating. She states that her pain is increasing. C/o intermittent dysphagia with food and alternating constipation and diarrhea and abdominal bloating. She was supposed to have egd and colonoscopy at the end of this month with dr Geronimo Boot. Ct abd this admission shows a mass near the root of the mesentery with possible necrosis; this was not  seen on a previous mri abd. Labs show hgb of 10.5 lft  and lipase are normal no leucocytosis plt normal. She has not had fevers chills weight loss melena hematemesis hematochezia  or rash.    Endoscopy hx egd years ago prior to nissen, colonoscopy 10 years ago-no records in epic    Family hx no hx of liver pancreatic stomach or colon cancer no uc/cdrohns  OBJECTIVE:    PAST MEDICAL/SURGICAL HISTORY  Past Medical History:   Diagnosis Date    Bursitis     Fibromyalgia     Scoliosis     Tendinitis      Past Surgical History:   Procedure Laterality Date    ABDOMINOPLASTY      ESOPHAGOGASTRIC FUNDOPLICATION  2000    GALLBLADDER SURGERY      TUBAL LIGATION         ALLERGIES:  Allergies   Allergen Reactions     Oxycodone Anaphylaxis    Parsley Leaves Anaphylaxis, Hives, Itching and Shortness Of Breath    Codeine      Anaphylaxis     Morphine        HOME MEDICATIONS:  Prior to Admission medications    Medication Sig Start Date End Date Taking? Authorizing Provider   famotidine (PEPCID) 40 MG tablet Take 1 tablet by mouth every evening 10/27/22   Daboul, Isam, MD   DULoxetine (CYMBALTA) 60 MG extended release capsule take 1 capsule by mouth once daily 08/17/22   Levander Campion, APRN - CNP   albuterol sulfate HFA (PROVENTIL HFA) 108 (90 Base) MCG/ACT inhaler Inhale 2 puffs into the lungs every 6 hours as needed for Wheezing or Shortness of Breath 03/27/22   Bunnie Domino, APRN - CNP   DULoxetine (CYMBALTA) 30 MG extended release capsule Take 1 capsule by mouth daily 02/19/22   Levander Campion, APRN - CNP   buPROPion (WELLBUTRIN XL) 150 MG extended release tablet Take 1 tablet by mouth every morning 02/19/22   Levander Campion, APRN - CNP   tiZANidine (ZANAFLEX) 2 MG tablet Take 1 tablet by mouth daily as needed (back muscle) 03/22/21   Clayborne Dana, [place], MD   TRAZODONE HCL PO Take by mouth  Patient not taking:  Reported on 11/16/2022    [provider]       CURRENT MEDICATIONS:  Scheduled Meds:   sodium chloride flush  5-40 mL IntraVENous 2 times per day    enoxaparin  40 mg SubCUTAneous Daily    pantoprazole (PROTONIX) 40 mg in sodium chloride (PF) 0.9 % 10 mL injection  40 mg IntraVENous Daily    buPROPion  150 mg Oral QAM    DULoxetine  30 mg Oral Daily     Continuous Infusions:   sodium chloride      sodium chloride 75 mL/hr at 11/16/22 1900     PRN Meds:sodium chloride flush, sodium chloride, potassium chloride **OR** potassium alternative oral replacement **OR** potassium chloride, magnesium sulfate, ondansetron **OR** ondansetron, polyethylene glycol, acetaminophen **OR** acetaminophen, potassium chloride, albuterol sulfate HFA, tiZANidine, HYDROmorphone    SOCIAL HISTORY:     Tobacco:   reports that she has never  smoked. She has never used smokeless tobacco.  Alcohol:   reports current alcohol use.  Illicit drugs:  reports no history of drug use.    FAMILY HISTORY:     Family History   Problem Relation Age of Onset    Diabetes Mother     Diabetes Maternal Grandmother        REVIEW OF SYSTEMS:    Constitutional: No fever, no chills, no lethargy, no weakness.  HEENT:  No headache, otalgia, itchy eyes, nasal discharge or sore throat.  Cardiac:  No chest pain, dyspnea, orthopnea or PND.  Chest:   No cough, phlegm or wheezing.  Abdomen:   abdominal pain,bloating constipation and diarrhea (alternating) dysphagia with food no nausea or vomiting.  Neuro:  No focal weakness, abnormal movements or seizure like activity.  Skin:   No rashes, no itching.  GU:   No hematuria, no pyuria, no dysuria, no flank pain.  Extremities:  No swelling or joint pains.  ROS was otherwise negative except as mentioned in the HOPI.     PHYSICAL EXAM:    BP (!) 104/53   Pulse 56   Temp 97.7 F (36.5 C) (Oral)   Resp 18   Ht 1.65 m (5' 4.96")   Wt 105.7 kg (233 lb 0.4 oz)   SpO2 99%   BMI 38.82 kg/m     GENERAL:   Well developed, Well nourished, No apparent distress  HEAD:   Normocephalic, Atraumatic  EENT:   EOMI, Sclera not icteric, Oropharynx moist  NECK:   Supple, Trachea midline  LUNGS:  CTA Bilaterally  HEART: 56 RRR, No murmur  ABDOMEN:   Soft, ruq and lower mid abd is tender, Nondistended, BS WNL  EXT:   No clubbing.  No cyanosis. No edema.  SKIN:   No rashes.  No jaundice.  No stigmata of liver disease.    MUSC/SKEL:   Adequate muscle bulk for patient's age, No significant synovitis, No deformities  NEURO:  A&O x Three, CN II- XII grossly intact    LABS AND IMAGING:    CBC  Recent Labs     11/16/22  1152 11/17/22  0553   WBC 7.5 6.4   HGB 11.9* 10.5*   HCT 38.4 33.5*   MCV 71.8* 72.5*   PLT 387 310       BMP  Recent Labs     11/16/22  1152 11/17/22  0553   NA 136 138   K 4.0 4.2   CL 102 108*   CO2 26 23   BUN 7 7  CREATININE 0.8 0.7    GLUCOSE 82 99   CALCIUM 10.8* 9.6       LFTS  Recent Labs     11/16/22  1152 11/17/22  0553   ALKPHOS 120* 100   ALT 14 11   AST 16 12   BILITOT 0.3 0.3   BILIDIR <0.1  --        AMYLASE/LIPASE/AMMONIA  Recent Labs     11/16/22  1152   LIPASE 24     Ct abd 11/16/22  FINDINGS:  Lower Chest: No focal consolidation at the lung bases.  The heart is not  enlarged.  No pericardial effusion.     Organs:     Liver: Normal appearance of the liver.     Gallbladder: The gallbladder is surgically absent.     Spleen: Unremarkable spleen.     Pancreas: No peripancreatic inflammatory changes.     Adrenal Glands: No focal adrenal abnormalities identified.     Kidneys: No hydronephrosis.Nonobstructing 4-5 mm stone in the left kidney.     GI/Bowel:     Stomach: The stomach is nondistended.     Small bowel: No evidence of small bowel obstruction.     Colon: No significant pericolonic inflammatory changes.     Appendix: Normal appearance of the appendix.     Pelvis: No free fluid in the pelvis.  Unremarkable pelvic organs.     Peritoneum/Retroperitoneum: Normal caliber abdominal aorta.  No  retroperitoneal lymphadenopathy by CT criteria.  In the mesentery there are  mild inflammatory changes about a nodular density which measures 2.4 x 2.5 x  3.7 cm.  Small foci of low density centrally within this mass.  This does not  confidently communicate with bowel loops.     Bones/Soft Tissues: No acute findings within the soft tissues or osseous  structures.     IMPRESSION:  No acute intra-abdominal or pelvic abnormality     Soft tissue mass in the ventral mesentery anterior to the aorta, not  confidently communicating with small bowel.  Findings are concerning for  neoplasm.  Enlarged lymph node with necrosis, peritoneal implant, carcinoid  tumor or other neoplasm not excluded.  Recommend correlation with outpatient  PET-CT.     Mri abd 08/14/22  FINDINGS:  Pancreas: Pancreas demonstrates normal signal intensity on the precontrast T1  images and  enhances homogeneously.  No duct dilation or obvious mass.  No  peripancreatic stranding or fluid collection.  Pancreatic divisum.     Biliary system: Status post cholecystectomy.  No biliary duct dilation.  The  common bile duct is within normal limits.  No filling defect within the  common bile duct.     Liver: No abnormal loss of signal intensity of liver parenchyma on the T1 in  or out of phase images.     There is likely a 1.3 cm benign hepatic cyst near the caudate lobe.  Incidentally noted accessory lobe of the liver either arising from segment 2  versus the caudate lobe.  No other focal hepatic lesion is identified.     Kidneys: The kidneys enhance symmetrically.  No hydronephrosis or perinephric  stranding.  Subcentimeter Bosniak type 1 left renal cysts.  No enhancing  renal mass.     Other:  The spleen and adrenal glands are without focal abnormality.  The  aorta is normal in caliber.     The celiac axis and SMA are patent.  The portal venous system is patent.  No  pathologically  enlarged adenopathy.  No ascites.  The osseous structures are  unremarkable.  The lung bases are clear.     IMPRESSION:  1. No biliary duct dilation or choledocholithiasis.  2. Incidentally noted pancreatic divisum.  3. Incidentally noted accessory lobe of the liver either arising from segment  2 versus caudate lobe.     IMPRESSION/plan  1. Post prandial ruq pain, lower mid abdominal pain, alternating bowel habits, dysphagia anemia, imaging showing mass near the root of her mesentery with small amount of pus vs necrosis, hx pancreatic divisim and liver cyst   Message sent to dr Geronimo Boot regarding moving up endoscopy for this admission  Possible bx of mass in vs outpt  Surgery following    Message sent to dr white       This plan was formulated in collaboration with Dr. Geronimo Boot  .  Thank you for allowing [place] to participate in the care of your patient.    Electronically signed by: Tommie Ard, APRN - CNP on 11/17/2022 at 7:30 AM

## 2022-11-17 NOTE — Progress Notes (Signed)
Patient seen also by my partner this morning.  States she still having some abdominal pain in the right upper quadrant but a little bit worse than yesterday.  Note plans by GI for endoscopic examinations with both EGD and colonoscopy tomorrow and eager to see results.    Spoke with interventional radiology this morning.  They believe reaching the lesion the patient has an the mesentery of her bowel may be very difficult to reach, minimal windows and lots of blood vessels in the way.  They state they would be willing to attempt this but would not guarantee that the procedure could actually be performed and that she may have a high risk for bleeding.  Discussed with the patient other option would be surgical excision and she would be more inclined for this to  Obtain tissue for biopsy and pathologic diagnosis but for complete excision of the lesion.  Will start to look for some more time.

## 2022-11-17 NOTE — Progress Notes (Signed)
Monica Wood is a 47 y.o. female patient.  She is here with postprandial abdominal pain and nausea.  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    Current Facility-Administered Medications   Medication Dose Route Frequency Provider Last Rate Last Admin    sodium chloride flush 0.9 % injection 5-40 mL  5-40 mL IntraVENous 2 times per day White, Beth A, MD   10 mL at 11/17/22 0809    sodium chloride flush 0.9 % injection 5-40 mL  5-40 mL IntraVENous PRN White, Beth A, MD        0.9 % sodium chloride infusion   IntraVENous PRN White, Beth A, MD        potassium chloride (KLOR-CON M) extended release tablet 40 mEq  40 mEq Oral PRN White, Beth A, MD        Or    potassium bicarb-citric acid (EFFER-K) effervescent tablet 40 mEq  40 mEq Oral PRN White, Beth A, MD        Or    potassium chloride 10 mEq/100 mL IVPB (Peripheral Line)  10 mEq IntraVENous PRN White, Beth A, MD        magnesium sulfate 2000 mg in 50 mL IVPB premix  2,000 mg IntraVENous PRN White, Beth A, MD        enoxaparin (LOVENOX) injection 40 mg  40 mg SubCUTAneous Daily White, Beth A, MD   40 mg at 11/16/22 2122    ondansetron (ZOFRAN-ODT) disintegrating tablet 4 mg  4 mg Oral Q8H PRN White, Beth A, MD        Or    ondansetron (ZOFRAN) injection 4 mg  4 mg IntraVENous Q6H PRN White, Beth A, MD        polyethylene glycol (GLYCOLAX) packet 17 g  17 g Oral Daily PRN White, Beth A, MD        acetaminophen (TYLENOL) tablet 650 mg  650 mg Oral Q6H PRN White, Beth A, MD        Or    acetaminophen (TYLENOL) suppository 650 mg  650 mg Rectal Q6H PRN White, Beth A, MD        0.9 % sodium chloride infusion   IntraVENous Continuous White, Beth A, MD 75 mL/hr at 11/16/22 1900 New Bag at 11/16/22 1900    potassium chloride 10 mEq/100 mL IVPB (Peripheral Line)  10 mEq IntraVENous PRN White, Beth A, MD        pantoprazole (PROTONIX) 40 mg in sodium chloride (PF) 0.9 % 10 mL injection  40 mg IntraVENous Daily  White, Beth A, MD   40 mg at 11/17/22 0807    albuterol sulfate HFA (PROVENTIL;VENTOLIN;PROAIR) 108 (90 Base) MCG/ACT inhaler 2 puff  2 puff Inhalation Q6H PRN White, Beth A, MD        buPROPion (WELLBUTRIN XL) extended release tablet 150 mg  150 mg Oral QAM White, Beth A, MD   150 mg at 11/17/22 0808    DULoxetine (CYMBALTA) extended release capsule 30 mg  30 mg Oral Daily White, Beth A, MD   30 mg at 11/17/22 1610    tiZANidine (ZANAFLEX) tablet 2 mg  2 mg Oral Daily PRN White, Beth A, MD        HYDROmorphone (DILAUDID) injection 0.25 mg  0.25 mg IntraVENous Q2H PRN White, Beth A, MD   0.25 mg at 11/17/22 9604     Allergies   Allergen Reactions  Oxycodone Anaphylaxis    Parsley Leaves Anaphylaxis, Hives, Itching and Shortness Of Breath    Codeine      Anaphylaxis     Morphine      Principal Problem:    Abdominal pain  Active Problems:    Abnormal radiologic density  Resolved Problems:    * No resolved hospital problems. *    Blood pressure (!) 104/53, pulse 56, temperature 97.7 F (36.5 C), temperature source Oral, resp. rate 18, height 1.65 m (5' 4.96"), weight 105.7 kg (233 lb 0.4 oz), SpO2 99 %.    Subjective  She continues to complain of pain in the right upper quadrant and some nausea.  She has not been able to vomit since she underwent a Nissen fundoplication several years ago.  She is passing flatus but has not had a bowel movement.  No fever or chills.    Objective  Abdomen was soft, with tenderness in the right upper quadrant and lower mid abdomen.  No guarding or peritoneal signs.    Lab work did not reveal any leukocytosis.  Hemoglobin was low at 10.5.  CMP normal.  UA normal.    Assessment & Plan  1.  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  She will make a day trip to [facility] for this procedure.    2.  Postprandial right upper quadrant pain  We will have her  gastroenterologist Dr. Geronimo Boot do upper and lower endoscopies, when available.    Lelon Frohlich, MD  11/17/2022

## 2022-11-17 NOTE — Progress Notes (Signed)
Contacted IR at [facility] to follow thru with the IR consult.  Spoke with Fannie Knee.  Fannie Knee will update provider and return call with provider answer on whether if biopsy can be completed.  Fannie Knee reports these biopsy typically are completed on outpatient.

## 2022-11-17 NOTE — Plan of Care (Signed)
Case d/w dr Geronimo Boot will plan for egd and colonoscopy tomorrow message sent to dr white. Marland KitchenTommie Ard, APRN - CNP

## 2022-11-18 MED ORDER — LIDOCAINE HCL 1 % IJ SOLN: 1 % | INTRAMUSCULAR | Status: AC

## 2022-11-18 MED ORDER — SODIUM CHLORIDE 0.9 % IV SOLN
0.9 | INTRAVENOUS | Status: DC | PRN
Start: 2022-11-18 — End: 2022-11-18

## 2022-11-18 MED ORDER — LABETALOL HCL 5 MG/ML IV SOLN
5 | INTRAVENOUS | Status: DC | PRN
Start: 2022-11-18 — End: 2022-11-18

## 2022-11-18 MED ORDER — NORMAL SALINE FLUSH 0.9 % IV SOLN
0.9 | Freq: Two times a day (BID) | INTRAVENOUS | Status: DC
Start: 2022-11-18 — End: 2022-11-18

## 2022-11-18 MED ORDER — HYDROMORPHONE HCL 1 MG/ML IJ SOLN
1 | INTRAMUSCULAR | Status: DC | PRN
Start: 2022-11-18 — End: 2022-11-18

## 2022-11-18 MED ORDER — PROPOFOL 500 MG/50ML IV EMUL: 500 MG/50ML | INTRAVENOUS | Status: AC

## 2022-11-18 MED ORDER — PROCHLORPERAZINE EDISYLATE 10 MG/2ML IJ SOLN
10 | Freq: Once | INTRAMUSCULAR | Status: DC | PRN
Start: 2022-11-18 — End: 2022-11-18

## 2022-11-18 MED ORDER — NALOXONE HCL 0.4 MG/ML IJ SOLN
0.4 MG/ML | INTRAMUSCULAR | Status: DC | PRN
Start: 2022-11-18 — End: 2022-11-18

## 2022-11-18 MED ORDER — PROPOFOL 200 MG/20ML IV EMUL: 200 MG/20ML | INTRAVENOUS | Status: AC

## 2022-11-18 MED ORDER — LIDOCAINE HCL 1 % IJ SOLN
1 | INTRAMUSCULAR | Status: DC | PRN
Start: 2022-11-18 — End: 2022-11-18
  Administered 2022-11-18: 18:00:00 100 via INTRAVENOUS

## 2022-11-18 MED ORDER — LACTATED RINGERS IV SOLN
INTRAVENOUS | Status: DC | PRN
Start: 2022-11-18 — End: 2022-11-18
  Administered 2022-11-18: 18:00:00 via INTRAVENOUS

## 2022-11-18 MED ORDER — HYDRALAZINE HCL 20 MG/ML IJ SOLN
20 | INTRAMUSCULAR | Status: DC | PRN
Start: 2022-11-18 — End: 2022-11-18

## 2022-11-18 MED ORDER — NORMAL SALINE FLUSH 0.9 % IV SOLN
0.9 | INTRAVENOUS | Status: DC | PRN
Start: 2022-11-18 — End: 2022-11-18

## 2022-11-18 MED ORDER — PROPOFOL 200 MG/20ML IV EMUL
200 | INTRAVENOUS | Status: DC | PRN
Start: 2022-11-18 — End: 2022-11-18
  Administered 2022-11-18 (×2): 150 via INTRAVENOUS

## 2022-11-18 MED FILL — DULOXETINE HCL 30 MG PO CPEP: 30 MG | ORAL | Qty: 1

## 2022-11-18 MED FILL — DILAUDID 1 MG/ML IJ SOLN: 1 MG/ML | INTRAMUSCULAR | Qty: 0.5

## 2022-11-18 MED FILL — DIPRIVAN 500 MG/50ML IV EMUL: 500 MG/50ML | INTRAVENOUS | Qty: 50

## 2022-11-18 MED FILL — DIPRIVAN 200 MG/20ML IV EMUL: 200 MG/20ML | INTRAVENOUS | Qty: 20

## 2022-11-18 MED FILL — TIZANIDINE HCL 4 MG PO TABS: 4 MG | ORAL | Qty: 1

## 2022-11-18 MED FILL — BUPROPION HCL ER (XL) 150 MG PO TB24: 150 MG | ORAL | Qty: 1

## 2022-11-18 MED FILL — PANTOPRAZOLE SODIUM 40 MG IV SOLR: 40 MG | INTRAVENOUS | Qty: 40

## 2022-11-18 MED FILL — LIDOCAINE HCL 1 % IJ SOLN: 1 % | INTRAMUSCULAR | Qty: 2

## 2022-11-18 NOTE — Plan of Care (Signed)
Problem: Discharge Planning  Goal: Discharge to home or other facility with appropriate resources  Recent Flowsheet Documentation  Taken 11/18/2022 0804 by Glennon Hamilton, RN  Discharge to home or other facility with appropriate resources: Identify barriers to discharge with patient and caregiver  11/18/2022 0103 by Jodean Lima, RN  Outcome: Progressing     Problem: Pain  Goal: Verbalizes/displays adequate comfort level or baseline comfort level  Recent Flowsheet Documentation  Taken 11/18/2022 0835 by Glennon Hamilton, RN  Verbalizes/displays adequate comfort level or baseline comfort level: Encourage patient to monitor pain and request assistance  11/18/2022 0103 by Jodean Lima, RN  Outcome: Progressing     Problem: Safety - Adult  Goal: Free from fall injury  11/18/2022 0103 by Jodean Lima, RN  Outcome: Progressing     Problem: Gastrointestinal - Adult  Goal: Minimal or absence of nausea and vomiting  Recent Flowsheet Documentation  Taken 11/18/2022 0804 by Glennon Hamilton, RN  Minimal or absence of nausea and vomiting: Administer IV fluids as ordered to ensure adequate hydration  11/18/2022 0103 by Jodean Lima, RN  Outcome: Progressing  Goal: Maintains or returns to baseline bowel function  Recent Flowsheet Documentation  Taken 11/18/2022 0804 by Glennon Hamilton, RN  Maintains or returns to baseline bowel function: Assess bowel function  11/18/2022 0103 by Jodean Lima, RN  Outcome: Progressing  Goal: Maintains adequate nutritional intake  Recent Flowsheet Documentation  Taken 11/18/2022 0804 by Glennon Hamilton, RN  Maintains adequate nutritional intake: Monitor percentage of each meal consumed  11/18/2022 0103 by Jodean Lima, RN  Outcome: Progressing     Problem: Anxiety  Goal: Will report anxiety at manageable levels  Description: INTERVENTIONS:  1. Administer medication as ordered  2. Teach and rehearse alternative coping skills  3. Provide emotional support with 1:1 interaction with  staff  Recent Flowsheet Documentation  Taken 11/18/2022 0804 by Glennon Hamilton, RN  Will report anxiety at manageable levels: Administer medication as ordered  11/18/2022 0103 by Jodean Lima, RN  Outcome: Progressing     Problem: Spiritual Care  Goal: Pt/Family able to move forward in process of forgiving self, others, and/or higher power  Description: INTERVENTIONS:  1. Assist patient/family to overcome blocks to healing by use of spiritual practices (prayer, meditation, guided imagery, reiki, breath work, Catering manager).  2. De-myth guilt and help patient/family identify possible irrational spiritual/cultural beliefs and values.  3. Explore possibilities of making amends & reconciliation with self, others, and/or a greater power.  4. Guide patient/family in identifying painful feelings of guilt.  5. Help patient/famiy explore and identify spiritual beliefs, cultural understandings or values that may help or hinder letting go of issue.  6. Help patient/family explore feelings of anger, bitterness, resentment.  7. Help patient/family identify and examine the situation in which these feelings are experienced.  8. Help patient/family identify destructive displacement of feelings onto other individuals.  9. Invite use of sacraments/rituals/ceremonies as appropriate (e.g. - confession, anointing, smudging).  10. Refer patient/family to formal counseling and/or to faith community for further support work.  Recent Flowsheet Documentation  Taken 11/18/2022 0804 by Glennon Hamilton, RN  Patient/family able to move forward in process of forgiving self, others, and/or higher power: Assist patient to overcome blocks to healing by use of spiritual practices (prayer, meditation, guided imagery, reiki, breath work, etc)  11/18/2022 0103 by Jodean Lima, RN  Outcome: Progressing

## 2022-11-18 NOTE — Progress Notes (Signed)
Patient states still some right upper quadrant discomfort although gradually tiny bit better from yesterday and a little bit better from the day before.  Tolerated her liquids yesterday for bowel prep.      Vital signs unremarkable  Afebrile    On examination patient looks to be relatively comfortable.  Awake alert and oriented  Mild right upper quadrant discomfort to palpation  No distention tympany or palpable masses    Patient for endoscopy examination for her postprandial right upper quadrant pain today by Dr. Georgian Co of GI of both EGD and colonoscopy.  Again discussed with patient that her mesenteric lesion may or may not be related to her right upper quadrant symptoms.  Tentatively on the OR schedule tomorrow for exploratory laparotomy and resection of this mass, this is assuming nothing else untoward found on her endoscopic examination today.

## 2022-11-18 NOTE — Op Note (Signed)
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     SPECIMENS:  Was Obtained:     1 cm polyp in the sigmoid colon removed with cold snare    HISTORY: The patient is a 47 y.o. year old female with history of above preop diagnosis.  I recommended colonoscopy with possible biopsy or polypectomy and I explained the risk, benefits, expected outcome, and alternatives to the procedure.  Risks included but are not limited to bleeding, infection, respiratory distress, hypotension, and perforation of the colon and possibility of missing a lesion.  The patient understands and is in agreement.        The patient was counseled at length about the risks of contracting Covid-19 during their perioperative period and any recovery window from their procedure.  The patient was made aware that contracting Covid-19  may worsen their prognosis for recovering from their procedure  and lend to a higher morbidity and/or mortality risk.  All material risks, benefits, and reasonable alternatives including postponing the procedure were discussed. The patient does wish to proceed with the procedure at this time.       PROCEDURE: The patient was given IV conscious sedation.  The patient's SPO2 remained above 90% throughout the procedure.     The colonoscope was inserted per rectum and advanced under direct vision to the cecum without difficulty.      Post sedation note :The patient's SPO2 remained above 90% throughout the procedure.the vital signs remained stable , and no immediate complication form the procedure noted, patient will be ready for d/c when criteria is met .        The prep was good.       Findings:  Terminal ileum: normal    Cecum/Ascending colon: normal    Transverse colon: normal    Descending/Sigmoid colon: abnormal: 1 cm polyp in the sigmoid colon removed with cold snare    Rectum/Anus: examined in normal and retroflexed positions and was abnormal: Hemorrhoids    Withdrawal Time was (minutes): 10    The colon was decompressed and the scope was removed.  The patient tolerated the procedure well.     Recommendations/Plan:   Patient going for surgery tomorrow for the mesenteric mass  Repeat colonoscopy in3years    Electronically signed by Marcia Brash, MD  on 11/18/2022 at 2:23 PM

## 2022-11-18 NOTE — Anesthesia Post-Procedure Evaluation (Signed)
Department of Anesthesiology  Postprocedure Note    Patient: Monica Wood  MRN: [id number]  Birthdate: 11/08/1975  Date of evaluation: 11/18/2022    Procedure Summary       Date: 11/18/22 Room / Location: [facility] PROCEDURE ROOM /  [facility]    Anesthesia Start: 1352 Anesthesia Stop: 1428    Procedures:       ESOPHAGOGASTRODUODENOSCOPY BIOPSY      COLONOSCOPY POLYPECTOMY SNARE BIOPSY Diagnosis:       Abdominal pain, unspecified abdominal location      (Abdominal pain, unspecified abdominal location [R10.9])    Surgeons: Marcia Brash, MD Responsible Provider: Juanito Doom, MD    Anesthesia Type: TIVA ASA Status: 3            Anesthesia Type: No value filed.    Aldrete Phase I:      Aldrete Phase II: Aldrete Score: 10    Anesthesia Post Evaluation    Patient location during evaluation: PACU  Patient participation: complete - patient participated  Level of consciousness: awake and awake and alert  Pain score: 0  Nausea & Vomiting: no nausea and no vomiting  Cardiovascular status: hemodynamically stable and blood pressure returned to baseline  Respiratory status: acceptable  Hydration status: euvolemic  Multimodal analgesia pain management approach  Pain management: adequate    No notable events documented.

## 2022-11-18 NOTE — Op Note (Signed)
PROCEDURE NOTE    DATE OF PROCEDURE: 11/18/2022     SURGEON: Marcia Brash, MD  Facility: [facility]   ASSISTANT: None  Anesthesia: MAC  PREOPERATIVE DIAGNOSIS:   abdominal pain  Dyspepsia  Esophageal dysphagia  Mesenteric lesion seen on imaging      Diagnosis:  Mild gastritis biopsies were taken      POSTOPERATIVE DIAGNOSIS: As described below    OPERATION: Upper GI endoscopy with Biopsy    ANESTHESIA: Moderate Sedation     ESTIMATED BLOOD LOSS: Less than 50 ml    COMPLICATIONS: None.     SPECIMENS:  Was Obtained: As below    HISTORY: The patient is a 47 y.o. year old female with history of above preop diagnosis.  I recommended esophagogastroduodenoscopy with possible biopsy and I explained the risk, benefits, expected outcome, and alternatives to the procedure.  Risks included but are not limited to bleeding, infection, respiratory distress, hypotension, and perforation of the esophagus, stomach, or duodenum.  Patient understands and is in agreement.      The patient was counseled at length about the risks of contracting Covid-19 during their perioperative period and any recovery window from their procedure.  The patient was made aware that contracting Covid-19  may worsen their prognosis for recovering from their procedure  and lend to a higher morbidity and/or mortality risk.  All material risks, benefits, and reasonable alternatives including postponing the procedure were discussed. The patient does wish to proceed with the procedure at this time.         PROCEDURE: The patient was given IV conscious sedation.  The patient's SPO2 remained above 90% throughout the procedure.The gastroscope was inserted orally and advanced under direct vision through the esophagus, through the stomach, through the pylorus, and into the descending duodenum.      Post sedation note :The patient's SPO2 remained above 90% throughout the procedure.the vital signs remained stable , and no immediate complication form the procedure  noted, patient will be ready for d/c when criteria is met .      Findings:    Retropharyngeal area was grossly normal appearing    Esophagus: normal    Stomach:   Mild gastritis biopsies were taken    Duodenum:     Descending: normal    Bulb: normal    The scope was removed and the patient tolerated the procedure well.     Recommendations/Plan:   Colonoscopy today    Electronically signed by Marcia Brash, MD  on 11/18/2022 at 2:20 PM

## 2022-11-18 NOTE — Anesthesia Pre-Procedure Evaluation (Signed)
Department of Anesthesiology  Preprocedure Note       Name:  Monica Wood   Age:  47 y.o.  DOB:  Aug 29, 1975                                          MRN:  [id number]         Date:  11/18/2022      Surgeon: Moishe Spice):  Daboul, Isam, MD    Procedure: Procedure(s):  ESOPHAGOGASTRODUODENOSCOPY BIOPSY  COLONOSCOPY DIAGNOSTIC    Medications prior to admission:   Prior to Admission medications    Medication Sig Start Date End Date Taking? Authorizing Provider   famotidine (PEPCID) 40 MG tablet Take 1 tablet by mouth every evening 10/27/22   Daboul, Isam, MD   DULoxetine (CYMBALTA) 60 MG extended release capsule take 1 capsule by mouth once daily 08/17/22   Levander Campion, APRN - CNP   albuterol sulfate HFA (PROVENTIL HFA) 108 (90 Base) MCG/ACT inhaler Inhale 2 puffs into the lungs every 6 hours as needed for Wheezing or Shortness of Breath 03/27/22   Bunnie Domino, APRN - CNP   DULoxetine (CYMBALTA) 30 MG extended release capsule Take 1 capsule by mouth daily 02/19/22   Levander Campion, APRN - CNP   buPROPion (WELLBUTRIN XL) 150 MG extended release tablet Take 1 tablet by mouth every morning 02/19/22   Levander Campion, APRN - CNP   tiZANidine (ZANAFLEX) 2 MG tablet Take 1 tablet by mouth daily as needed (back muscle) 03/22/21   Clayborne Dana, Dilnoor, MD   TRAZODONE HCL PO Take by mouth  Patient not taking: Reported on 11/16/2022    [provider]       Current medications:    Current Facility-Administered Medications   Medication Dose Route Frequency Provider Last Rate Last Admin    HYDROmorphone (DILAUDID) injection 0.5 mg  0.5 mg IntraVENous Q2H PRN White, Beth A, MD   0.5 mg at 11/18/22 1105    sodium chloride flush 0.9 % injection 5-40 mL  5-40 mL IntraVENous 2 times per day Christy Gentles A, MD   10 mL at 11/17/22 2002    sodium chloride flush 0.9 % injection 5-40 mL  5-40 mL IntraVENous PRN White, Beth A, MD        0.9 % sodium chloride infusion   IntraVENous PRN White, Beth A, MD        potassium chloride (KLOR-CON M)  extended release tablet 40 mEq  40 mEq Oral PRN White, Beth A, MD        Or    potassium bicarb-citric acid (EFFER-K) effervescent tablet 40 mEq  40 mEq Oral PRN White, Beth A, MD        Or    potassium chloride 10 mEq/100 mL IVPB (Peripheral Line)  10 mEq IntraVENous PRN White, Beth A, MD        magnesium sulfate 2000 mg in 50 mL IVPB premix  2,000 mg IntraVENous PRN White, Beth A, MD        [Held by provider] enoxaparin (LOVENOX) injection 40 mg  40 mg SubCUTAneous Daily White, Beth A, MD   40 mg at 11/16/22 2122    ondansetron (ZOFRAN-ODT) disintegrating tablet 4 mg  4 mg Oral Q8H PRN White, Beth A, MD        Or    ondansetron (ZOFRAN) injection 4 mg  4 mg IntraVENous Q6H  PRN White, Beth A, MD        polyethylene glycol (GLYCOLAX) packet 17 g  17 g Oral Daily PRN White, Beth A, MD        acetaminophen (TYLENOL) tablet 650 mg  650 mg Oral Q6H PRN White, Beth A, MD        Or    acetaminophen (TYLENOL) suppository 650 mg  650 mg Rectal Q6H PRN White, Beth A, MD        0.9 % sodium chloride infusion   IntraVENous Continuous White, Beth A, MD 75 mL/hr at 11/17/22 2027 New Bag at 11/17/22 2027    potassium chloride 10 mEq/100 mL IVPB (Peripheral Line)  10 mEq IntraVENous PRN White, Beth A, MD        pantoprazole (PROTONIX) 40 mg in sodium chloride (PF) 0.9 % 10 mL injection  40 mg IntraVENous Daily White, Beth A, MD   40 mg at 11/18/22 0805    albuterol sulfate HFA (PROVENTIL;VENTOLIN;PROAIR) 108 (90 Base) MCG/ACT inhaler 2 puff  2 puff Inhalation Q6H PRN White, Beth A, MD        buPROPion (WELLBUTRIN XL) extended release tablet 150 mg  150 mg Oral QAM White, Beth A, MD   150 mg at 11/18/22 0805    DULoxetine (CYMBALTA) extended release capsule 30 mg  30 mg Oral Daily White, Beth A, MD   30 mg at 11/18/22 0805    tiZANidine (ZANAFLEX) tablet 2 mg  2 mg Oral Daily PRN Christy Gentles A, MD   2 mg at 11/17/22 2357       Allergies:    Allergies   Allergen Reactions    Oxycodone Anaphylaxis    Parsley Leaves Anaphylaxis,  Hives, Itching and Shortness Of Breath    Codeine      Anaphylaxis     Morphine        Problem List:    Patient Active Problem List   Diagnosis Code    Change in vision H53.9    Obesity (BMI 35.0-39.9 without comorbidity) E66.9    Fibromyalgia M79.7    Chronic bilateral low back pain without sciatica M54.50, G89.29    Bursitis M71.9    Contusion of foot S90.30XA    Depression F32.A    Elevated C-reactive protein (CRP) R79.82    Impingement syndrome of left shoulder region M75.42    Impingement syndrome of right shoulder region M75.41    Osteoarthritis of spine with radiculopathy, 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    Restless leg syndrome G25.81    Osteoarthritis of spine with radiculopathy, 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    Trochanteric bursitis of both hips M70.61, M70.62    Vitamin D deficiency E55.9    Abdominal pain R10.9    Abnormal radiologic density R93.89    Intraabdominal mass R19.00    Anemia D64.9       Past Medical History:        Diagnosis Date    Bursitis     Fibromyalgia     Scoliosis     Tendinitis        Past Surgical History:        Procedure Laterality Date    ABDOMINOPLASTY      ESOPHAGOGASTRIC FUNDOPLICATION  2000    GALLBLADDER SURGERY      TUBAL LIGATION         Social History:    Social History     Tobacco Use    Smoking status: Never    Smokeless tobacco: Never  Substance Use Topics    Alcohol use: Yes     Comment: 3/4 times a week                                Counseling given: Not Answered      Vital Signs (Current):   Vitals:    11/18/22 0805 11/18/22 0835 11/18/22 1105 11/18/22 1210   BP:    (!) 125/51   Pulse:    61   Resp: 18 16 18 17    Temp:    97.2 F (36.2 C)   TempSrc:    Infrared   SpO2:    100%   Weight:       Height:                                                  BP Readings from Last 3 Encounters:   11/18/22 (!) 125/51   10/29/22 98/78   10/27/22 120/71       NPO Status:                                                                                 BMI:   Wt Readings from Last 3 Encounters:    11/17/22 105.7 kg (233 lb 0.4 oz)   10/29/22 98.9 kg (218 lb)   10/27/22 101.2 kg (223 lb 3.2 oz)     Body mass index is 38.82 kg/m.    CBC:   Lab Results   Component Value Date/Time    WBC 6.4 11/17/2022 05:53 AM    RBC 4.62 11/17/2022 05:53 AM    HGB 10.5 11/17/2022 05:53 AM    HCT 33.5 11/17/2022 05:53 AM    MCV 72.5 11/17/2022 05:53 AM    RDW 13.7 11/17/2022 05:53 AM    PLT 310 11/17/2022 05:53 AM       CMP:   Lab Results   Component Value Date/Time    NA 138 11/17/2022 05:53 AM    K 4.2 11/17/2022 05:53 AM    CL 108 11/17/2022 05:53 AM    CO2 23 11/17/2022 05:53 AM    BUN 7 11/17/2022 05:53 AM    CREATININE 0.7 11/17/2022 05:53 AM    GFRAA >60 01/20/2021 04:54 AM    LABGLOM >90 11/17/2022 05:53 AM    LABGLOM 73 10/29/2022 01:00 PM    GLUCOSE 99 11/17/2022 05:53 AM    CALCIUM 9.6 11/17/2022 05:53 AM    BILITOT 0.3 11/17/2022 05:53 AM    ALKPHOS 100 11/17/2022 05:53 AM    AST 12 11/17/2022 05:53 AM    ALT 11 11/17/2022 05:53 AM       POC Tests: No results for input(s): "POCGLU", "POCNA", "POCK", "POCCL", "POCBUN", "POCHEMO", "POCHCT" in the last 72 hours.    Coags: No results found for: "PROTIME", "INR", "APTT"    HCG (If Applicable):   Lab Results   Component Value Date    PREGTESTUR NEGATIVE 01/20/2021  ABGs: No results found for: "PHART", "PO2ART", "PCO2ART", "HCO3ART", "BEART", "O2SATART"     Type & Screen (If Applicable):  No results found for: "LABABO"    Drug/Infectious Status (If Applicable):  No results found for: "HIV", "HEPCAB"    COVID-19 Screening (If Applicable):   Lab Results   Component Value Date/Time    COVID19 Not Detected 06/08/2022 06:45 PM    COVID19 DETECTED 07/25/2020 02:14 PM           Anesthesia Evaluation  Patient summary reviewed and Nursing notes reviewed  Airway: Mallampati: II  TM distance: >3 FB   Neck ROM: full  Mouth opening: > = 3 FB   Dental: normal exam     Comment: Permanent retainer    Pulmonary:Negative Pulmonary ROS and normal exam                                Cardiovascular:Negative CV ROS  Exercise tolerance: good (>4 METS)        ECG reviewed  Rhythm: regular  Rate: normal           Beta Blocker:  Not on Beta Blocker      ROS comment: NSR     Neuro/Psych:   Negative Neuro/Psych ROS  (+) neuromuscular disease:, psychiatric history: stable with treatment            GI/Hepatic/Renal: Neg GI/Hepatic/Renal ROS            Endo/Other: Negative Endo/Other ROS                    Abdominal: normal exam  (+) obese    Abdomen: soft.      Vascular: negative vascular ROS.         Other Findings:             Anesthesia Plan      TIVA     ASA 3     (Discussed ASA monitors and TIVA with the patient. Risks and benefits discussed. All questions answered.)  Induction: intravenous.    MIPS: prophylactic pharmacologic antiemetic agents not administered perioperatively for documented reasons.  Anesthetic plan and risks discussed with patient.    Use of blood products discussed with patient whom consented to blood products.    Plan discussed with CRNA.    Attending anesthesiologist reviewed and agrees with Preprocedure content                Juanito Doom, MD   11/18/2022

## 2022-11-18 NOTE — Plan of Care (Signed)
Problem: Discharge Planning  Goal: Discharge to home or other facility with appropriate resources  Outcome: Progressing   Patient actively participates in ADL's and decision making regarding plan of care  Problem: Pain  Goal: Verbalizes/displays adequate comfort level or baseline comfort level  Outcome: Progressing   No new signs/symptoms of pain noted, pain rating < 3 on scale 0-10, pain controlled with medication/repositioning  Problem: Safety - Adult  Goal: Free from fall injury  Outcome: Progressing   No falls/injuries this shift, bed in lowest position, brakes on, bed alarm on, call light in reach, side rails up x2  Problem: Gastrointestinal - Adult  Goal: Maintains or returns to baseline bowel function  Outcome: Progressing  Pt actively drinking bowel prep

## 2022-11-19 ENCOUNTER — Ambulatory Visit: Payer: PRIVATE HEALTH INSURANCE | Primary: Nurse Practitioner

## 2022-11-19 LAB — CHROMOGRANIN A: Chromogranin A: 104 ng/mL (ref 0–187)

## 2022-11-19 MED ORDER — NORMAL SALINE FLUSH 0.9 % IV SOLN
0.9 | INTRAVENOUS | Status: DC | PRN
Start: 2022-11-19 — End: 2022-11-19

## 2022-11-19 MED ORDER — NORMAL SALINE FLUSH 0.9 % IV SOLN
0.9 | Freq: Two times a day (BID) | INTRAVENOUS | Status: DC
Start: 2022-11-19 — End: 2022-11-19

## 2022-11-19 MED ORDER — SUGAMMADEX SODIUM 200 MG/2ML IV SOLN
200 | INTRAVENOUS | Status: DC | PRN
Start: 2022-11-19 — End: 2022-11-19
  Administered 2022-11-19: 19:00:00 200 via INTRAVENOUS

## 2022-11-19 MED ORDER — HYDRALAZINE HCL 20 MG/ML IJ SOLN
20 | INTRAMUSCULAR | Status: DC | PRN
Start: 2022-11-19 — End: 2022-11-19

## 2022-11-19 MED ORDER — MIDAZOLAM HCL 2 MG/2ML IJ SOLN
2 | INTRAMUSCULAR | Status: DC | PRN
Start: 2022-11-19 — End: 2022-11-19
  Administered 2022-11-19: 17:00:00 2 via INTRAVENOUS

## 2022-11-19 MED ORDER — BUPIVACAINE HCL (PF) 0.25 % IJ SOLN
0.25 | INTRAMUSCULAR | Status: AC
Start: 2022-11-19 — End: 2022-11-19
  Administered 2022-11-19: 17:00:00 50 via PERINEURAL

## 2022-11-19 MED ORDER — MIDAZOLAM HCL 2 MG/2ML IJ SOLN: 2 MG/2ML | INTRAMUSCULAR | Status: DC

## 2022-11-19 MED ORDER — MIDAZOLAM HCL 2 MG/2ML IJ SOLN: 2 MG/2ML | INTRAMUSCULAR | Status: AC

## 2022-11-19 MED ORDER — ONDANSETRON HCL 4 MG/2ML IJ SOLN: 4 MG/2ML | INTRAMUSCULAR | Status: AC

## 2022-11-19 MED ORDER — DIPHENHYDRAMINE HCL 50 MG/ML IJ SOLN
50 | Freq: Once | INTRAMUSCULAR | Status: DC | PRN
Start: 2022-11-19 — End: 2022-11-19

## 2022-11-19 MED ORDER — FENTANYL CITRATE (PF) 100 MCG/2ML IJ SOLN
100 | Freq: Once | INTRAMUSCULAR | Status: DC
Start: 2022-11-19 — End: 2022-11-19

## 2022-11-19 MED ORDER — PHENYLEPHRINE HCL 1 MG/10ML IV SOSY
1 | INTRAVENOUS | Status: DC | PRN
Start: 2022-11-19 — End: 2022-11-19
  Administered 2022-11-19: 18:00:00 100 via INTRAVENOUS

## 2022-11-19 MED ORDER — CEFAZOLIN 2000 MG IN NS 50 ML IVPB
Status: DC | PRN
Start: 2022-11-19 — End: 2022-11-19
  Administered 2022-11-19: 18:00:00 2000 via INTRAVENOUS

## 2022-11-19 MED ORDER — ROCURONIUM BROMIDE 50 MG/5ML IV SOLN
50 | INTRAVENOUS | Status: DC | PRN
Start: 2022-11-19 — End: 2022-11-19
  Administered 2022-11-19 (×2): 10 via INTRAVENOUS
  Administered 2022-11-19: 18:00:00 40 via INTRAVENOUS

## 2022-11-19 MED ORDER — LIDOCAINE HCL 1 % IJ SOLN: 1 % | INTRAMUSCULAR | Status: AC

## 2022-11-19 MED ORDER — CEFAZOLIN SODIUM 2 G IJ SOLR: 2 g | INTRAMUSCULAR | Status: DC

## 2022-11-19 MED ORDER — ONDANSETRON HCL 4 MG/2ML IJ SOLN
4 | INTRAMUSCULAR | Status: DC | PRN
Start: 2022-11-19 — End: 2022-11-19
  Administered 2022-11-19: 19:00:00 4 via INTRAVENOUS

## 2022-11-19 MED ORDER — DEXAMETHASONE SOD PHOSPHATE PF 10 MG/ML IJ SOLN: 10 MG/ML | INTRAMUSCULAR | Status: AC

## 2022-11-19 MED ORDER — KETOROLAC TROMETHAMINE 30 MG/ML IJ SOLN
30 | INTRAMUSCULAR | Status: DC | PRN
Start: 2022-11-19 — End: 2022-11-19
  Administered 2022-11-19: 19:00:00 30 via INTRAVENOUS

## 2022-11-19 MED ORDER — MIDAZOLAM HCL (PF) 2 MG/2ML IJ SOLN
2 | Freq: Once | INTRAMUSCULAR | Status: AC
Start: 2022-11-19 — End: 2022-11-19
  Administered 2022-11-19: 17:00:00 2 mg via INTRAVENOUS

## 2022-11-19 MED ORDER — ROCURONIUM BROMIDE 50 MG/5ML IV SOLN: 50 MG/5ML | INTRAVENOUS | Status: AC

## 2022-11-19 MED ORDER — LIDOCAINE HCL 1 % IJ SOLN
1 | INTRAMUSCULAR | Status: DC | PRN
Start: 2022-11-19 — End: 2022-11-19
  Administered 2022-11-19: 17:00:00 40 via INTRAVENOUS

## 2022-11-19 MED ORDER — FENTANYL CITRATE (PF) 100 MCG/2ML IJ SOLN
100 MCG/2ML | INTRAMUSCULAR | Status: AC
  Administered 2022-11-19: 17:00:00 100

## 2022-11-19 MED ORDER — LACTATED RINGERS IV SOLN
INTRAVENOUS | Status: DC
Start: 2022-11-19 — End: 2022-11-19
  Administered 2022-11-19: 16:00:00 via INTRAVENOUS

## 2022-11-19 MED ORDER — LIDOCAINE HCL (PF) 1 % IJ SOLN
1 | Freq: Once | INTRAMUSCULAR | Status: DC | PRN
Start: 2022-11-19 — End: 2022-11-19

## 2022-11-19 MED ORDER — PROPOFOL 200 MG/20ML IV EMUL
200 | INTRAVENOUS | Status: DC | PRN
Start: 2022-11-19 — End: 2022-11-19
  Administered 2022-11-19: 18:00:00 150 via INTRAVENOUS

## 2022-11-19 MED ORDER — MEPERIDINE HCL 50 MG/ML IJ SOLN
50 | INTRAMUSCULAR | Status: DC | PRN
Start: 2022-11-19 — End: 2022-11-19

## 2022-11-19 MED ORDER — DEXAMETHASONE SOD PHOSPHATE PF 10 MG/ML IJ SOLN
10 | INTRAMUSCULAR | Status: DC | PRN
Start: 2022-11-19 — End: 2022-11-19
  Administered 2022-11-19: 18:00:00 10 via INTRAVENOUS

## 2022-11-19 MED ORDER — HYDROMORPHONE HCL 1 MG/ML IJ SOLN
1 | INTRAMUSCULAR | Status: DC | PRN
Start: 2022-11-19 — End: 2022-11-19

## 2022-11-19 MED ORDER — MEPERIDINE HCL 50 MG/ML IJ SOLN
50 | Freq: Once | INTRAMUSCULAR | Status: DC
Start: 2022-11-19 — End: 2022-11-19

## 2022-11-19 MED ORDER — MIDAZOLAM HCL 2 MG/2ML IJ SOLN
2 MG/2ML | INTRAMUSCULAR | Status: AC
  Administered 2022-11-19: 20:00:00 2 via INTRAVENOUS

## 2022-11-19 MED ORDER — PHENYLEPHRINE HCL 1 MG/10ML IV SOSY: 1 MG/10ML | INTRAVENOUS | Status: AC

## 2022-11-19 MED ORDER — PROPOFOL 200 MG/20ML IV EMUL: 200 MG/20ML | INTRAVENOUS | Status: AC

## 2022-11-19 MED ORDER — FENTANYL CITRATE (PF) 100 MCG/2ML IJ SOLN
100 | INTRAMUSCULAR | Status: DC | PRN
Start: 2022-11-19 — End: 2022-11-19
  Administered 2022-11-19 (×2): 50 via INTRAVENOUS

## 2022-11-19 MED ORDER — MIDAZOLAM HCL (PF) 2 MG/2ML IJ SOLN
2 | Freq: Once | INTRAMUSCULAR | Status: AC
Start: 2022-11-19 — End: 2022-11-19

## 2022-11-19 MED ORDER — METOCLOPRAMIDE HCL 5 MG/ML IJ SOLN
5 | Freq: Once | INTRAMUSCULAR | Status: DC | PRN
Start: 2022-11-19 — End: 2022-11-19

## 2022-11-19 MED ORDER — ONDANSETRON HCL 4 MG/2ML IJ SOLN
4 | Freq: Once | INTRAMUSCULAR | Status: DC | PRN
Start: 2022-11-19 — End: 2022-11-19

## 2022-11-19 MED ORDER — MIDAZOLAM HCL (PF) 2 MG/2ML IJ SOLN
2 | Freq: Once | INTRAMUSCULAR | Status: DC | PRN
Start: 2022-11-19 — End: 2022-11-19

## 2022-11-19 MED ORDER — LABETALOL HCL 5 MG/ML IV SOLN
5 | INTRAVENOUS | Status: DC | PRN
Start: 2022-11-19 — End: 2022-11-19

## 2022-11-19 MED ORDER — SODIUM CHLORIDE 0.9 % IV SOLN
0.9 | INTRAVENOUS | Status: DC | PRN
Start: 2022-11-19 — End: 2022-11-19

## 2022-11-19 MED ORDER — HYDROMORPHONE HCL 1 MG/ML IJ SOLN
1 MG/ML | INTRAMUSCULAR | Status: AC
  Administered 2022-11-19: 20:00:00 0.5 via INTRAVENOUS

## 2022-11-19 MED ORDER — FENTANYL CITRATE (PF) 100 MCG/2ML IJ SOLN: 100 MCG/2ML | INTRAMUSCULAR | Status: AC

## 2022-11-19 MED ORDER — NALOXONE HCL 0.4 MG/ML IJ SOLN
0.4 | INTRAMUSCULAR | Status: DC | PRN
Start: 2022-11-19 — End: 2022-11-19

## 2022-11-19 MED ORDER — KETOROLAC TROMETHAMINE 30 MG/ML IJ SOLN: 30 MG/ML | INTRAMUSCULAR | Status: AC

## 2022-11-19 MED ORDER — SUGAMMADEX SODIUM 200 MG/2ML IV SOLN: 200 MG/2ML | INTRAVENOUS | Status: AC

## 2022-11-19 MED FILL — DILAUDID 1 MG/ML IJ SOLN: 1 MG/ML | INTRAMUSCULAR | Qty: 0.5

## 2022-11-19 MED FILL — MIDAZOLAM HCL 2 MG/2ML IJ SOLN: 2 MG/ML | INTRAMUSCULAR | Qty: 2

## 2022-11-19 MED FILL — FENTANYL CITRATE (PF) 100 MCG/2ML IJ SOLN: 100 MCG/2ML | INTRAMUSCULAR | Qty: 2

## 2022-11-19 MED FILL — PHENYLEPHRINE HCL (PRESSORS) 1 MG/10ML IV SOSY: 1 MG/0ML | INTRAVENOUS | Qty: 10

## 2022-11-19 MED FILL — CEFAZOLIN SODIUM 2 G IJ SOLR: 2 g | INTRAMUSCULAR | Qty: 2000

## 2022-11-19 MED FILL — LIDOCAINE HCL 1 % IJ SOLN: 1 % | INTRAMUSCULAR | Qty: 2

## 2022-11-19 MED FILL — TIZANIDINE HCL 4 MG PO TABS: 4 MG | ORAL | Qty: 1

## 2022-11-19 MED FILL — ROCURONIUM BROMIDE 50 MG/5ML IV SOLN: 50 MG/5ML | INTRAVENOUS | Qty: 5

## 2022-11-19 MED FILL — DIPRIVAN 200 MG/20ML IV EMUL: 200 MG/20ML | INTRAVENOUS | Qty: 20

## 2022-11-19 MED FILL — PANTOPRAZOLE SODIUM 40 MG IV SOLR: 40 MG | INTRAVENOUS | Qty: 40

## 2022-11-19 MED FILL — BUPROPION HCL ER (XL) 150 MG PO TB24: 150 MG | ORAL | Qty: 1

## 2022-11-19 MED FILL — BRIDION 200 MG/2ML IV SOLN: 200 MG/2ML | INTRAVENOUS | Qty: 2

## 2022-11-19 MED FILL — ONDANSETRON HCL 4 MG/2ML IJ SOLN: 4 MG/2ML | INTRAMUSCULAR | Qty: 2

## 2022-11-19 MED FILL — DEXAMETHASONE SOD PHOSPHATE PF 10 MG/ML IJ SOLN: 10 MG/ML | INTRAMUSCULAR | Qty: 1

## 2022-11-19 MED FILL — DULOXETINE HCL 30 MG PO CPEP: 30 MG | ORAL | Qty: 1

## 2022-11-19 MED FILL — KETOROLAC TROMETHAMINE 30 MG/ML IJ SOLN: 30 MG/ML | INTRAMUSCULAR | Qty: 1

## 2022-11-19 NOTE — Op Note (Signed)
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  SMALL BOWEL RESECTION.  INCISIONAL BIOPSY OF MESENTERIC LYMPH NODE    Surgeon(s):  Lucious Groves, MD    Assistant:   First Assistant: Rudean Hitt, RN    Anesthesia: General    Estimated Blood Loss (mL): less than 50     Complications: None    Specimens:   ID Type Source Tests Collected by Time Destination   A : MID SMALL BOWEL Tissue Tissue SURGICAL PATHOLOGY Lucious Groves, MD 12-09-2022 1412    B : INCISIONAL BIOPSY MESENTERIC LYMPH NODE FRESH Tissue Tissue SURGICAL PATHOLOGY Lucious Groves, MD 2022-12-09 1449    C : INCISIONAL BIOPSY MESENTERIC LYMPH NODE Tissue Tissue SURGICAL PATHOLOGY Christy Gentles A, MD 12/09/22 1450        Implants:  * No implants in log *      Drains:   [REMOVED] Urinary Catheter 12-09-22 Foley (Removed)       Findings:  Infection Present At Time Of Surgery (PATOS) (choose all levels that have infection present):  No infection present  Other Findings: Multiple small submucosal lesions of mid small bowel    Detailed Description of Procedure:   The patient is a 47 year old female who presented with right upper quadrant pain postprandially.  She yesterday underwent colonoscopy with removal of a single polyp and EGD with gastritis.  On CT scan on admission she was however found to have about a 3 cm lesion of the root of her small bowel mesentery.  She is not having any pain here.  Radiology was concerned about the possibility of malignancy but thought that the lesion was not able to be reached safely with core needle biopsy.  She therefore presents at this time for exploratory laparotomy.    Informed consent was obtained for the patient prior to surgery.    Patient is brought to  the operating room laid on table supine position.  General oral endotracheal esthesia was induced difficulties.  Timeout is performed.  Patient's abdomen is prepped with ChloraPrep and after 3 minutes draped out in sterile fashion.    Lower midline incision is made from just above the pubic area to just above the umbilicus.  The skin is open with a knife and subcutaneous fatty tissue and midline fascia were opened with the Bovie cautery.  Tiny serosal tear was made on the transverse colon during this and this was repaired with a couple stitches of 3 oh silks.    There is an obvious palpable lesion very deep in the small bowel mesentery near the root of the mesentery.  The colon appears to be unremarkable.  Liver palpates unremarkable.  However in the small bowel in the mid small bowel there is several lesions within the bowel that appear to be fixed to the bowel wall.  These vary anywhere from about 4 mm to about a centimeter in size.  There are scattered over about a foot of the small bowel and with the portion of the bowel that is associated with this area of adenopathy.    Selected to small bowel resection.  The small bowel was divided  with a GIA stapler.  We asked him to take this out in a total of 3 pieces as further small lesions were found as the bowel was examined for second time.  The small bowel mesentery was divided with clamps ties and 2 oh silks as well as with the LigaSure.  This was later examined on the back table and the largest lesion was about a centimeter in size and appeared to be submucosal.    Side-to-side stapled anastomosis was performed with a 55 GIA stapler after crotch stitch was placed and the small opening for the stapler was closed with 2 layers with a running canal suture of 3-0 Vicryl and then interrupted sutures of 3 oh silks with mucosa and seromuscular layers respectively.  The mesenteric defect was also closed with a silk as well.    The adenopathy appeared to be somewhat matted  multiple nodes going down deep into the root of the mesentery.  It was elected therefore to perform a shave biopsy rather than excisional biopsy as we had some concerns about taking out the vessels that were feeding the patient's anastomosis.  The peritoneum and fat in the mesentery over top of the adenopathy was removed on the upper large lymph node.  This was then shave biopsied in about 4 pieces with 1 piece being sent fresh and the rest being sent in formalin for permanent pathology.  This area had a piece of Surgicel applied and hemostasis was achieved on the raw surface of the lymph node after several minutes of pressure.    Bowel was then placed back in its normal position.  Gown and gloves were changed.  Closure was performed with 2 sutures of running oh looped PDS and the skin was closed with staples.  Sponge and instrument counts were correct at the end of the case.  Patient was awakened extubated returned recovery room.    Electronically signed by Lucious Groves, MD on 11/19/2022 at 3:40 PM

## 2022-11-19 NOTE — Anesthesia Post-Procedure Evaluation (Signed)
Department of Anesthesiology  Postprocedure Note    Patient: Symiah Socarras  MRN: [id number]  Birthdate: 1975-12-01  Date of evaluation: 11/19/2022    Procedure Summary       Date: 11/19/22 Room / Location: [facility] [place] OR 03 / [facility] [place]    Anesthesia Start: 1325 Anesthesia Stop: 1535    Procedure: LAPAROTOMY EXPLORATORY.  SMALL BOWEL RESECTION.  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    Aldrete Phase I: Aldrete Score: 9    Aldrete Phase II: Aldrete Score: 10    Anesthesia Post Evaluation    Patient location during evaluation: PACU  Patient participation: complete - patient participated  Level of consciousness: awake and alert  Airway patency: patent  Nausea & Vomiting: no nausea and no vomiting  Cardiovascular status: blood pressure returned to baseline  Respiratory status: acceptable and room air  Hydration status: euvolemic  Pain management: adequate and satisfactory to patient    No notable events documented.

## 2022-11-19 NOTE — Care Coordination-Inpatient (Signed)
Case Management Assessment  Initial Evaluation    Date/Time of Evaluation: 11/19/2022 11:26 AM  Assessment Completed by: Anne Hahn, RN    If patient is discharged prior to next notation, then this note serves as note for discharge by case management.    Patient Name: Monica Wood                   Date of Birth: July 10, 1976  Diagnosis: Abdominal pain [R10.9]  Intraabdominal mass [R19.00]  Abdominal pain, unspecified abdominal location [R10.9]                   Date / Time: 11/16/2022 11:36 AM    Patient Admission Status: Observation   Readmission Risk (Low < 19, Mod (19-27), High > 27): No data recorded  Current PCP: Levander Campion, APRN - CNP  PCP verified by CM? Yes    Chart Reviewed: Yes      History Provided by: Patient  Patient Orientation: Alert and Oriented    Patient Cognition: Alert    Hospitalization in the last 30 days (Readmission):  No    If yes, Readmission Assessment in CM Navigator will be completed.    Advance Directives:      Code Status: Full Code   Patient's Primary Decision Maker is: Legal Next of Kin      Discharge Planning:    Patient lives with: Spouse/Significant Other, Children Type of Home: House  Primary Care Giver: Self  Patient Support Systems include: Spouse/Significant Other, Children, Family Members   Current Financial resources:  Psychologist, sport and exercise)  Current community resources:    Current services prior to admission: None            Current DME:              Type of Home Care services:  None    ADLS  Prior functional level: Independent in ADLs/IADLs  Current functional level: Independent in ADLs/IADLs    PT AM-PAC:   /24  OT AM-PAC:   /24    Family can provide assistance at DC: Yes  Would you like Case Management to discuss the discharge plan with any other family members/significant others, and if so, who? No  Plans to Return to Present Housing: Unknown at present  Other Identified Issues/Barriers to RETURNING to current housing: none  Potential Assistance needed at discharge: Home  Care            Potential DME:    Patient expects to discharge to: House  Plan for transportation at discharge: Family    Financial    Payor: [employer] / Plan: [employer] / Product Type: *No Product type* /     Does insurance require precert for SNF: Yes    Potential assistance Purchasing Medications: No  Meds-to-Beds request:        [id number] [place], [place] - [address] - P [phone] Carmon Ginsberg [phone]  [address]  [place] [address]  Phone: [phone] Fax: [phone]      Notes:    Factors facilitating achievement of predicted outcomes: Family support, Motivated, Cooperative, and Pleasant    Barriers to discharge: Medical complications    Additional Case Management Notes: Goal is home with family to assist.  Will continue to follow for needs.     The Plan for Transition of Care is related to the following treatment goals of Abdominal pain [R10.9]  Intraabdominal mass [R19.00]  Abdominal pain, unspecified abdominal location [R10.9]  IF APPLICABLE: The Patient and/or patient representative Amaiya and her family were provided with a choice of provider and agrees with the discharge plan. Freedom of choice list with basic dialogue that supports the patient's individualized plan of care/goals and shares the quality data associated with the providers was provided to:     Patient Representative Name:       The Patient and/or Patient Representative Agree with the Discharge Plan?  y    Anne Hahn, RN  Case Management Department  Ph:  Fax:

## 2022-11-19 NOTE — Plan of Care (Signed)
Problem: Pain  Goal: Verbalizes/displays adequate comfort level or baseline comfort level  Outcome: Progressing   No new signs/symptoms of pain noted, pain rating < 3 on scale 0-10, pain controlled with medication/repositioning  Problem: Safety - Adult  Goal: Free from fall injury  Outcome: Progressing   No falls/injuries this shift, bed in lowest position, brakes on, bed alarm on, call light in reach, side rails up x2  Problem: Gastrointestinal - Adult  Goal: Minimal or absence of nausea and vomiting  Outcome: Progressing  Flowsheets (Taken 11/18/2022 1500 by Glennon Hamilton, RN)  Minimal or absence of nausea and vomiting:   Administer IV fluids as ordered to ensure adequate hydration   Provide nonpharmacologic comfort measures as appropriate   No nausea/vomiting this shift

## 2022-11-19 NOTE — Anesthesia Procedure Notes (Signed)
Peripheral Block    Patient location during procedure: pre-op  Reason for block: post-op pain management and at surgeon's request  Start time: 11/19/2022 1:10 PM  End time: 11/19/2022 1:14 PM  Staffing  Performed: anesthesiologist   Anesthesiologist: Ileene Hutchinson, MD  Performed by: Ileene Hutchinson, MD  Authorized by: Ileene Hutchinson, MD    Preanesthetic Checklist  Completed: patient identified, IV checked, site marked, risks and benefits discussed, surgical/procedural consents, equipment checked, pre-op evaluation, timeout performed, anesthesia consent given, oxygen available, monitors applied/VS acknowledged, fire risk safety assessment completed and verbalized and blood product R/B/A discussed and consented  Peripheral Block   Patient position: supine  Prep: ChloraPrep  Provider prep: mask and sterile gloves  Patient monitoring: cardiac monitor, continuous pulse ox, frequent blood pressure checks, IV access, oxygen and responsive to questions  Block type: TAP  Laterality: bilateral  Injection technique: single-shot  Guidance: ultrasound guided  Local infiltration: bupivacaine  Infiltration strength: 0.25 %  Local infiltration: bupivacaine  Dose: 4 mL    Needle   Needle type: insulated echogenic nerve stimulator needle   Needle gauge: 20 G  Needle localization: ultrasound guidance and anatomical landmarks  Needle insertion depth: 7 cm  Test dose: negative  Needle length: 10 cm  Assessment   Injection assessment: negative aspiration for heme, no paresthesia on injection, local visualized surrounding nerve on ultrasound and no intravascular symptoms  Paresthesia pain: none  Slow fractionated injection: yes  Hemodynamics: stable  Outcomes: uncomplicated and patient tolerated procedure well    Medications Administered  BUPivacaine (MARCAINE) PF injection 0.25% - Perineural   50 mL - 11/19/2022 1:10:00 PM

## 2022-11-19 NOTE — Progress Notes (Signed)
[facility]    Gastroenterology Daily Progress Note      Patient:   Monica Wood   DOB:    1976-03-12   Facility:   [facility]  Date:     11/19/2022  Consultant:   Tommie Ard, APRN - CNP, CNP      SUBJECTIVE  47 y.o. female admitted 11/16/2022 with Abdominal pain [R10.9]  Intraabdominal mass [R19.00]  Abdominal pain, unspecified abdominal location [R10.9] and seen for abdominal pain and dysphagia. The pt was seen and examined. She is s/p egd and colonoscopy yesterday that are discussed below. No c/o abdominal pain or dysphagia. Npo for abdominal surgery for mesenteric mass today..        OBJECTIVE  Scheduled Meds:   sodium chloride flush  5-40 mL IntraVENous 2 times per day    [Held by provider] enoxaparin  40 mg SubCUTAneous Daily    pantoprazole (PROTONIX) 40 mg in sodium chloride (PF) 0.9 % 10 mL injection  40 mg IntraVENous Daily    buPROPion  150 mg Oral QAM    DULoxetine  30 mg Oral Daily       Vital Signs:  BP 106/60   Pulse 59   Temp 98.3 F (36.8 C) (Oral)   Resp 18   Ht 1.65 m (5' 4.96")   Wt 105.7 kg (233 lb 0.4 oz)   SpO2 95%   BMI 38.82 kg/m    Review of Systems - History obtained from chart review and the patient  General ROS: negative  Respiratory ROS: no cough, shortness of breath, or wheezing  Cardiovascular ROS: no chest pain or dyspnea on exertion  Gastrointestinal ROS: no abdominal pain, change in bowel habits, or black or bloody stools   Physical Exam:       General Appearance: alert and oriented to person, place and time, well-developed and well-nourished, in no acute distress  Skin: warm and dry, no rash or erythema  Head: normocephalic and atraumatic  Eyes: pupils equal, round, and reactive to light, extraocular eye movements intact, conjunctivae normal  ENT: hearing grossly normal bilaterally  Neck: neck supple and non tender without mass, no thyromegaly or thyroid nodules, no cervical lymphadenopathy   Pulmonary/Chest: clear to auscultation bilaterally-  no wheezes, rales or rhonchi, normal air movement, no respiratory distress  Cardiovascular: bradycardic rate, regular rhythm, normal S1 and S2, no murmurs, rubs, clicks or gallops, distal pulses intact, no carotid bruits  Abdomen: soft, non-tender, non-distended, normal bowel sounds, no masses or organomegaly  Extremities: no cyanosis, clubbing or edema  Musculoskeletal: normal range of motion, no joint swelling, deformity or tenderness  Neurologic: no cranial nerve deficit and muscle strength normal    Lab and Imaging Review     CBC  Recent Labs     11/16/22  1152 11/17/22  0553   WBC 7.5 6.4   HGB 11.9* 10.5*   HCT 38.4 33.5*   MCV 71.8* 72.5*   PLT 387 310       BMP  Recent Labs     11/16/22  1152 11/17/22  0553   NA 136 138   K 4.0 4.2   CL 102 108*   CO2 26 23   BUN 7 7   CREATININE 0.8 0.7   GLUCOSE 82 99   CALCIUM 10.8* 9.6       LFTS  Recent Labs     11/16/22  1152 11/17/22  0553   ALKPHOS 120* 100   ALT 14 11  AST 16 12   BILITOT 0.3 0.3   BILIDIR <0.1  --        AMYLASE/LIPASE/AMMONIA  Recent Labs     11/16/22  1152   LIPASE 24     Egd dr daboul 11/18/22  Findings:     Retropharyngeal area was grossly normal appearing     Esophagus: normal     Stomach:   Mild gastritis biopsies were taken     Duodenum:     Descending: normal    Bulb: normal    Colonoscopy dr Geronimo Boot 11/18/22  The prep was good.       Findings:  Terminal ileum: normal     Cecum/Ascending colon: normal     Transverse colon: normal     Descending/Sigmoid colon: abnormal: 1 cm polyp in the sigmoid colon removed with cold snare     Rectum/Anus: examined in normal and retroflexed positions and was abnormal: Hemorrhoids      Ct abd 11/16/22  FINDINGS:  Lower Chest: No focal consolidation at the lung bases.  The heart is not  enlarged.  No pericardial effusion.     Organs:     Liver: Normal appearance of the liver.     Gallbladder: The gallbladder is surgically absent.     Spleen: Unremarkable spleen.     Pancreas: No peripancreatic inflammatory  changes.     Adrenal Glands: No focal adrenal abnormalities identified.     Kidneys: No hydronephrosis.Nonobstructing 4-5 mm stone in the left kidney.     GI/Bowel:     Stomach: The stomach is nondistended.     Small bowel: No evidence of small bowel obstruction.     Colon: No significant pericolonic inflammatory changes.     Appendix: Normal appearance of the appendix.     Pelvis: No free fluid in the pelvis.  Unremarkable pelvic organs.     Peritoneum/Retroperitoneum: Normal caliber abdominal aorta.  No  retroperitoneal lymphadenopathy by CT criteria.  In the mesentery there are  mild inflammatory changes about a nodular density which measures 2.4 x 2.5 x  3.7 cm.  Small foci of low density centrally within this mass.  This does not  confidently communicate with bowel loops.     Bones/Soft Tissues: No acute findings within the soft tissues or osseous  structures.     IMPRESSION:  No acute intra-abdominal or pelvic abnormality     Soft tissue mass in the ventral mesentery anterior to the aorta, not  confidently communicating with small bowel.  Findings are concerning for  neoplasm.  Enlarged lymph node with necrosis, peritoneal implant, carcinoid  tumor or other neoplasm not excluded.  Recommend correlation with outpatient  PET-CT.     Mri abd 08/14/22  FINDINGS:  Pancreas: Pancreas demonstrates normal signal intensity on the precontrast T1  images and enhances homogeneously.  No duct dilation or obvious mass.  No  peripancreatic stranding or fluid collection.  Pancreatic divisum.     Biliary system: Status post cholecystectomy.  No biliary duct dilation.  The  common bile duct is within normal limits.  No filling defect within the  common bile duct.     Liver: No abnormal loss of signal intensity of liver parenchyma on the T1 in  or out of phase images.     There is likely a 1.3 cm benign hepatic cyst near the caudate lobe.  Incidentally noted accessory lobe of the liver either arising from segment 2  versus the  caudate lobe.  No other  focal hepatic lesion is identified.     Kidneys: The kidneys enhance symmetrically.  No hydronephrosis or perinephric  stranding.  Subcentimeter Bosniak type 1 left renal cysts.  No enhancing  renal mass.     Other:  The spleen and adrenal glands are without focal abnormality.  The  aorta is normal in caliber.     The celiac axis and SMA are patent.  The portal venous system is patent.  No  pathologically enlarged adenopathy.  No ascites.  The osseous structures are  unremarkable.  The lung bases are clear.     IMPRESSION:  1. No biliary duct dilation or choledocholithiasis.  2. Incidentally noted pancreatic divisum.  3. Incidentally noted accessory lobe of the liver either arising from segment  2 versus caudate lobe.         ASSESSMENT/plan  Post prandial ruq pain, lower mid abdominal pain, alternating bowel habits, dysphagia anemia, imaging showing mass near the root of her mesentery with small amount of pus vs necrosis, hx pancreatic divisim and liver cyst  s/p egd that showed gastritis and colonoscopy that removed a sigmoid polyp and hemmroids were present  F/u with dr Geronimo Boot for bx results  Npo for surgery for abdominal mass today    Gi signing off  Message sent to dr Haywood Pao      This plan was formulated in collaboration with Dr. Marnette Burgess .    Electronically signed by: Tommie Ard, APRN - CNP on 11/19/2022 at 6:54 AM     Attending Physician Statement  I have discussed the care of [facility] and I have examined the patient myselft and taken ros and hpi , including pertinent history and exam findings,  with the author of this note . I have reviewed the key elements of all parts of the encounter with the nurse practitioner/resident.  I agree with the assessment, plan and orders as documented by the above health care provider.  I have made necessary changes as deemed appropriate directly in the note.     More than 50% of the time was spent taking care of this patient in addition to the nurse  practitioner time.  That also included history taking follow-up physical examination and review of system.    Noted plans for surgery today for mesenteric mass noted.  S/p EGD and colonoscopy 11/18/22  Clinically feeling well  No further GI input, follow-up in clinic with Dr. Geronimo Boot.    Electronically signed by Spero Geralds, MD

## 2022-11-19 NOTE — Anesthesia Pre-Procedure Evaluation (Signed)
Department of Anesthesiology  Preprocedure Note       Name:  Monica Wood   Age:  48 y.o.  DOB:  11/16/1975                                          MRN:  [id number]         Date:  11/19/2022      Surgeon: Moishe Spice):  White, Risa Grill, MD    Procedure: Procedure(s):  LAPAROTOMY EXPLORATORY    Medications prior to admission:   Prior to Admission medications    Medication Sig Start Date End Date Taking? Authorizing Provider   famotidine (PEPCID) 40 MG tablet Take 1 tablet by mouth every evening 10/27/22   Daboul, Isam, MD   DULoxetine (CYMBALTA) 60 MG extended release capsule take 1 capsule by mouth once daily 08/17/22   Levander Campion, APRN - CNP   albuterol sulfate HFA (PROVENTIL HFA) 108 (90 Base) MCG/ACT inhaler Inhale 2 puffs into the lungs every 6 hours as needed for Wheezing or Shortness of Breath 03/27/22   Bunnie Domino, APRN - CNP   DULoxetine (CYMBALTA) 30 MG extended release capsule Take 1 capsule by mouth daily 02/19/22   Levander Campion, APRN - CNP   buPROPion (WELLBUTRIN XL) 150 MG extended release tablet Take 1 tablet by mouth every morning 02/19/22   Levander Campion, APRN - CNP   tiZANidine (ZANAFLEX) 2 MG tablet Take 1 tablet by mouth daily as needed (back muscle) 03/22/21   Clayborne Dana, Diln[place], MD   TRAZODONE HCL PO Take by mouth  Patient not taking: Reported on 11/16/2022    [provider]       Current medications:    Current Facility-Administered Medications   Medication Dose Route Frequency Provider Last Rate Last Admin   . HYDROmorphone (DILAUDID) injection 0.5 mg  0.5 mg IntraVENous Q2H PRN Daboul, Isam, MD   0.5 mg at 11/19/22 1226   . sodium chloride flush 0.9 % injection 5-40 mL  5-40 mL IntraVENous 2 times per day Daboul, Isam, MD   10 mL at 11/18/22 2110   . sodium chloride flush 0.9 % injection 5-40 mL  5-40 mL IntraVENous PRN Daboul, Isam, MD       . 0.9 % sodium chloride infusion   IntraVENous PRN Daboul, Isam, MD       . potassium chloride (KLOR-CON M) extended release tablet 40 mEq  40 mEq  Oral PRN Daboul, Isam, MD        Or   . potassium bicarb-citric acid (EFFER-K) effervescent tablet 40 mEq  40 mEq Oral PRN Daboul, Isam, MD        Or   . potassium chloride 10 mEq/100 mL IVPB (Peripheral Line)  10 mEq IntraVENous PRN Daboul, Isam, MD       . magnesium sulfate 2000 mg in 50 mL IVPB premix  2,000 mg IntraVENous PRN Daboul, Isam, MD       . Lisbeth Ply by provider] enoxaparin (LOVENOX) injection 40 mg  40 mg SubCUTAneous Daily White, Beth A, MD   40 mg at 11/16/22 2122   . ondansetron (ZOFRAN-ODT) disintegrating tablet 4 mg  4 mg Oral Q8H PRN Daboul, Isam, MD        Or   . ondansetron (ZOFRAN) injection 4 mg  4 mg IntraVENous Q6H PRN Daboul, Isam, MD       .  polyethylene glycol (GLYCOLAX) packet 17 g  17 g Oral Daily PRN Daboul, Isam, MD       . acetaminophen (TYLENOL) tablet 650 mg  650 mg Oral Q6H PRN Daboul, Isam, MD        Or   . acetaminophen (TYLENOL) suppository 650 mg  650 mg Rectal Q6H PRN Daboul, Isam, MD       . potassium chloride 10 mEq/100 mL IVPB (Peripheral Line)  10 mEq IntraVENous PRN Daboul, Isam, MD       . pantoprazole (PROTONIX) 40 mg in sodium chloride (PF) 0.9 % 10 mL injection  40 mg IntraVENous Daily Daboul, Isam, MD   40 mg at 11/19/22 0745   . albuterol sulfate HFA (PROVENTIL;VENTOLIN;PROAIR) 108 (90 Base) MCG/ACT inhaler 2 puff  2 puff Inhalation Q6H PRN Daboul, Isam, MD       . buPROPion (WELLBUTRIN XL) extended release tablet 150 mg  150 mg Oral QAM Daboul, Isam, MD   150 mg at 11/18/22 0805   . DULoxetine (CYMBALTA) extended release capsule 30 mg  30 mg Oral Daily Daboul, Isam, MD   30 mg at 11/18/22 0805   . tiZANidine (ZANAFLEX) tablet 2 mg  2 mg Oral Daily PRN Daboul, Isam, MD   2 mg at 11/18/22 2109       Allergies:    Allergies   Allergen Reactions   . Oxycodone Anaphylaxis   . Parsley Leaves Anaphylaxis, Hives, Itching and Shortness Of Breath   . Codeine      Anaphylaxis    . Morphine        Problem List:    Patient Active Problem List   Diagnosis Code   . Change in  vision H53.9   . Obesity (BMI 35.0-39.9 without comorbidity) E66.9   . Fibromyalgia M79.7   . Chronic bilateral low back pain without sciatica M54.50, G89.29   . Bursitis M71.9   . Contusion of foot S90.30XA   . Depression F32.A   . Elevated C-reactive protein (CRP) R79.82   . Impingement syndrome of left shoulder region M75.42   . Impingement syndrome of right shoulder region M75.41   . 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 Restless leg syndrome G25.81   . 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 Trochanteric bursitis of both hips M70.61, M70.62   . Vitamin D deficiency E55.9   . Abdominal pain R10.9   . Abnormal radiologic density R93.89   . Intraabdominal mass R19.00   . Anemia D64.9   . Esophageal dysphagia R13.19       Past Medical History:        Diagnosis Date   . Bursitis    . Fibromyalgia    . Scoliosis    . Tendinitis        Past Surgical History:        Procedure Laterality Date   . ABDOMINOPLASTY     . ESOPHAGOGASTRIC FUNDOPLICATION  2000   . GALLBLADDER SURGERY     . TUBAL LIGATION         Social History:    Social History     Tobacco Use   . Smoking status: Never   . Smokeless tobacco: Never   Substance Use Topics   . Alcohol use: Yes     Comment: 3/4 times a week  Counseling given: Not Answered      Vital Signs (Current):   Vitals:    11/18/22 1454 11/18/22 1930 11/19/22 0000 11/19/22 0852   BP: (!) 113/50 126/67 106/60 108/65   Pulse: 64 72 59 61   Resp: 16 17 18 15    Temp: 97.9 F (36.6 C) 97.8 F (36.6 C) 98.3 F (36.8 C) 97.7 F (36.5 C)   TempSrc:  Oral Oral Oral   SpO2: 98% 99% 95% 97%   Weight:       Height:                                                  BP Readings from Last 3 Encounters:   11/19/22 108/65   10/29/22 98/78   10/27/22 120/71       NPO Status:                          Time of last solid consumption: 1900                        Date of last liquid consumption: 11/18/22                         Date of last solid food consumption: 11/16/22    BMI:   Wt Readings from Last 3 Encounters:   11/17/22 105.7 kg (233 lb 0.4 oz)   10/29/22 98.9 kg (218 lb)   10/27/22 101.2 kg (223 lb 3.2 oz)     Body mass index is 38.82 kg/m.    CBC:   Lab Results   Component Value Date/Time    WBC 6.4 11/17/2022 05:53 AM    RBC 4.62 11/17/2022 05:53 AM    HGB 10.5 11/17/2022 05:53 AM    HCT 33.5 11/17/2022 05:53 AM    MCV 72.5 11/17/2022 05:53 AM    RDW 13.7 11/17/2022 05:53 AM    PLT 310 11/17/2022 05:53 AM       CMP:   Lab Results   Component Value Date/Time    NA 138 11/17/2022 05:53 AM    K 4.2 11/17/2022 05:53 AM    CL 108 11/17/2022 05:53 AM    CO2 23 11/17/2022 05:53 AM    BUN 7 11/17/2022 05:53 AM    CREATININE 0.7 11/17/2022 05:53 AM    GFRAA >60 01/20/2021 04:54 AM    LABGLOM >90 11/17/2022 05:53 AM    LABGLOM 73 10/29/2022 01:00 PM    GLUCOSE 99 11/17/2022 05:53 AM    CALCIUM 9.6 11/17/2022 05:53 AM    BILITOT 0.3 11/17/2022 05:53 AM    ALKPHOS 100 11/17/2022 05:53 AM    AST 12 11/17/2022 05:53 AM    ALT 11 11/17/2022 05:53 AM       POC Tests: No results for input(s): "POCGLU", "POCNA", "POCK", "POCCL", "POCBUN", "POCHEMO", "POCHCT" in the last 72 hours.    Coags: No results found for: "PROTIME", "INR", "APTT"    HCG (If Applicable):   Lab Results   Component Value Date    PREGTESTUR NEGATIVE 01/20/2021        ABGs: No results found for: "PHART", "PO2ART", "PCO2ART", "HCO3ART", "BEART", "O2SATART"     Type & Screen (If Applicable):  No results found for: "LABABO"  Drug/Infectious Status (If Applicable):  No results found for: "HIV", "HEPCAB"    COVID-19 Screening (If Applicable):   Lab Results   Component Value Date/Time    COVID19 Not Detected 06/08/2022 06:45 PM    COVID19 DETECTED 07/25/2020 02:14 PM           Anesthesia Evaluation  Patient summary reviewed and Nursing notes reviewed   no history of anesthetic complications:   Airway: Mallampati: II  TM distance: >3 FB   Neck ROM: full  Mouth opening: > = 3  FB   Dental: normal exam     Comment:  Permanent retainer    Pulmonary:Negative Pulmonary ROS and normal exam  breath sounds clear to auscultation                             Cardiovascular:Negative CV ROS  Exercise tolerance: good (>4 METS)          Rhythm: regular  Rate: normal                    Neuro/Psych:   (+) neuromuscular disease:, psychiatric history:depression/anxiety              ROS comment: Chronic bilateral low back pain without sciatica GI/Hepatic/Renal:            ROS comment: Severe obesity.   Endo/Other:    (+) : arthritis: OA..                  ROS comment: Mesenteric mass Abdominal: normal exam      Abdomen: soft.      Vascular: negative vascular ROS.         Other Findings:         Anesthesia Plan      general     ASA 3     (GA and TAP block)  Induction: intravenous.    MIPS: Postoperative opioids intended and Prophylactic antiemetics administered.  Anesthetic plan and risks discussed with patient.    Use of blood products discussed with patient whom consented to blood products.    Plan discussed with CRNA.          Post-op pain plan if not by surgeon: single peripheral nerve block        Ileene Hutchinson, MD   11/19/2022

## 2022-11-20 LAB — BASIC METABOLIC PANEL
Anion Gap: 10 mmol/L (ref 9–17)
BUN: 7 mg/dL (ref 6–20)
CO2: 23 mmol/L (ref 20–31)
Calcium: 10 mg/dL (ref 8.6–10.4)
Chloride: 105 mmol/L (ref 98–107)
Creatinine: 0.7 mg/dL (ref 0.5–0.9)
Est, Glom Filt Rate: >90 mL/min/{1.73_m2} (ref >60)
Glucose: 100 mg/dL — ABNORMAL HIGH (ref 70–99)
Potassium: 4 mmol/L (ref 3.7–5.3)
Sodium: 138 mmol/L (ref 135–144)

## 2022-11-20 LAB — CBC WITH AUTO DIFFERENTIAL
Basophils %: 0 % (ref 0–2)
Basophils Absolute: 0 10*3/uL (ref 0.0–0.2)
Eosinophils %: 0 % — ABNORMAL LOW (ref 1–4)
Eosinophils Absolute: 0 10*3/uL (ref 0.0–0.4)
Hematocrit: 32.5 % — ABNORMAL LOW (ref 36–46)
Hemoglobin: 10.3 g/dL — ABNORMAL LOW (ref 12.0–16.0)
Lymphocytes %: 16 % — ABNORMAL LOW (ref 24–44)
Lymphocytes Absolute: 1.7 10*3/uL (ref 1.0–4.8)
MCH: 22.9 pg — ABNORMAL LOW (ref 26–34)
MCHC: 31.6 g/dL (ref 31–37)
MCV: 72.5 fL — ABNORMAL LOW (ref 80–100)
MPV: 8 fL (ref 6.0–12.0)
Monocytes %: 8 % (ref 2–11)
Monocytes Absolute: 0.9 10*3/uL (ref 0.1–1.2)
Neutrophils %: 76 % — ABNORMAL HIGH (ref 36–66)
Neutrophils Absolute: 8.1 10*3/uL — ABNORMAL HIGH (ref 1.8–7.7)
Platelets: 322 10*3/uL (ref 140–450)
RBC: 4.49 m/uL (ref 4.0–5.2)
RDW: 14.1 % (ref 12.5–15.4)
WBC: 10.7 10*3/uL (ref 3.5–11.0)

## 2022-11-20 MED ORDER — MELATONIN 3 MG PO TABS
3 | Freq: Every evening | ORAL | Status: DC | PRN
Start: 2022-11-20 — End: 2022-11-24
  Administered 2022-11-20 – 2022-11-23 (×3): 6 mg via ORAL

## 2022-11-20 MED ORDER — HYDROMORPHONE HCL 1 MG/ML IJ SOLN
1 | INTRAMUSCULAR | Status: DC | PRN
Start: 2022-11-20 — End: 2022-11-22
  Administered 2022-11-20 – 2022-11-22 (×8): 0.5 mg via INTRAVENOUS

## 2022-11-20 MED ORDER — HYDROMORPHONE HCL 1 MG/ML IJ SOLN
1 | INTRAMUSCULAR | Status: DC | PRN
Start: 2022-11-20 — End: 2022-11-22
  Administered 2022-11-20 – 2022-11-22 (×11): 1 mg via INTRAVENOUS

## 2022-11-20 MED ORDER — MELATONIN 5 MG PO TABS
5 | Freq: Every evening | ORAL | Status: DC | PRN
Start: 2022-11-20 — End: 2022-11-20

## 2022-11-20 MED FILL — PANTOPRAZOLE SODIUM 40 MG IV SOLR: 40 MG | INTRAVENOUS | Qty: 40

## 2022-11-20 MED FILL — DILAUDID 1 MG/ML IJ SOLN: 1 MG/ML | INTRAMUSCULAR | Qty: 0.5

## 2022-11-20 MED FILL — DULOXETINE HCL 30 MG PO CPEP: 30 MG | ORAL | Qty: 1

## 2022-11-20 MED FILL — BUPROPION HCL ER (XL) 150 MG PO TB24: 150 MG | ORAL | Qty: 1

## 2022-11-20 MED FILL — MELATONIN 3 MG PO TABS: 3 MG | ORAL | Qty: 2

## 2022-11-20 MED FILL — DILAUDID 1 MG/ML IJ SOLN: 1 MG/ML | INTRAMUSCULAR | Qty: 1

## 2022-11-20 MED FILL — TIZANIDINE HCL 4 MG PO TABS: 4 MG | ORAL | Qty: 1

## 2022-11-20 MED FILL — MELATONIN 5 MG PO TABS: 5 MG | ORAL | Qty: 1

## 2022-11-20 MED FILL — ENOXAPARIN SODIUM 40 MG/0.4ML IJ SOSY: 40 MG/0.4ML | INTRAMUSCULAR | Qty: 0.4

## 2022-11-20 NOTE — Plan of Care (Signed)
Problem: Gastrointestinal - Adult  Goal: Maintains adequate nutritional intake  Outcome: Not Progressing  Flowsheets (Taken 11/20/2022 0240)  Maintains adequate nutritional intake: Monitor intake and output, weight and lab values  Note: Patient is ordered NPO currently.     Problem: Gastrointestinal - Adult  Goal: Maintains adequate nutritional intake  Outcome: Not Progressing  Flowsheets (Taken 11/20/2022 0240)  Maintains adequate nutritional intake: Monitor intake and output, weight and lab values  Note: Patient is ordered NPO currently.     Problem: Discharge Planning  Goal: Discharge to home or other facility with appropriate resources  Outcome: Progressing  Flowsheets (Taken 11/19/2022 0847 by Donneta Romberg, RN)  Discharge to home or other facility with appropriate resources:   Identify barriers to discharge with patient and caregiver   Arrange for needed discharge resources and transportation as appropriate     Problem: Pain  Goal: Verbalizes/displays adequate comfort level or baseline comfort level  Outcome: Progressing  Flowsheets (Taken 11/18/2022 0835 by Glennon Hamilton, RN)  Verbalizes/displays adequate comfort level or baseline comfort level: Encourage patient to monitor pain and request assistance     Problem: Safety - Adult  Goal: Free from fall injury  Outcome: Progressing  Note: Patient instructed on safety measures.     Problem: Gastrointestinal - Adult  Goal: Minimal or absence of nausea and vomiting  Outcome: Progressing  Flowsheets (Taken 11/20/2022 0240)  Minimal or absence of nausea and vomiting:   Maintain NPO status until nausea and vomiting are resolved   Administer ordered antiemetic medications as needed   Advance diet as tolerated, if ordered  Goal: Maintains or returns to baseline bowel function  Outcome: Progressing  Flowsheets (Taken 11/20/2022 0240)  Maintains or returns to baseline bowel function:   Assess bowel function   Administer ordered medications as needed   Encourage mobilization  and activity     Problem: Anxiety  Goal: Will report anxiety at manageable levels  Description: INTERVENTIONS:  1. Administer medication as ordered  2. Teach and rehearse alternative coping skills  3. Provide emotional support with 1:1 interaction with staff  Outcome: Progressing  Flowsheets (Taken 11/20/2022 0240)  Will report anxiety at manageable levels: Provide emotional support with 1:1 interaction with staff  Note: MD updated regarding anxiety level, no current prn orders, home meds resume tomorrow.     Problem: Spiritual Care  Goal: Pt/Family able to move forward in process of forgiving self, others, and/or higher power  Description: INTERVENTIONS:  1. Assist patient/family to overcome blocks to healing by use of spiritual practices (prayer, meditation, guided imagery, reiki, breath work, Catering manager).  2. De-myth guilt and help patient/family identify possible irrational spiritual/cultural beliefs and values.  3. Explore possibilities of making amends & reconciliation with self, others, and/or a greater power.  4. Guide patient/family in identifying painful feelings of guilt.  5. Help patient/famiy explore and identify spiritual beliefs, cultural understandings or values that may help or hinder letting go of issue.  6. Help patient/family explore feelings of anger, bitterness, resentment.  7. Help patient/family identify and examine the situation in which these feelings are experienced.  8. Help patient/family identify destructive displacement of feelings onto other individuals.  9. Invite use of sacraments/rituals/ceremonies as appropriate (e.g. - confession, anointing, smudging).  10. Refer patient/family to formal counseling and/or to faith community for further support work.  Outcome: Progressing  Flowsheets (Taken 11/18/2022 0804 by Glennon Hamilton, RN)  Patient/family able to move forward in process of forgiving self, others, and/or higher power: Assist patient to  overcome blocks to healing by use of spiritual  practices (prayer, meditation, guided imagery, reiki, breath work, etc)     Problem: ABCDS Injury Assessment  Goal: Absence of physical injury  Outcome: Progressing  Flowsheets (Taken 11/20/2022 0240)  Absence of Physical Injury: Implement safety measures based on patient assessment

## 2022-11-20 NOTE — Progress Notes (Signed)
Monica Wood is a 47 y.o. female patient.  She underwent an exploratory laparotomy by my partner Dr. Cliffton Asters yesterday with small bowel resection for multiple submucosal lesions of a portion of mid small bowel, with associated mesenteric lymphadenopathy.  Upper and lower endoscopies by GI had revealed mild gastritis and a sigmoid polyp.    1. Abdominal pain, unspecified abdominal location    2. Intraabdominal mass    3. Mesenteric mass      Past Medical History:   Diagnosis Date    Bursitis     Fibromyalgia     Scoliosis     Tendinitis      No past surgical history pertinent negatives on file.  Scheduled Meds:   sodium chloride flush  5-40 mL IntraVENous 2 times per day    enoxaparin  40 mg SubCUTAneous Daily    pantoprazole (PROTONIX) 40 mg in sodium chloride (PF) 0.9 % 10 mL injection  40 mg IntraVENous Daily    buPROPion  150 mg Oral QAM    DULoxetine  30 mg Oral Daily     Continuous Infusions:   sodium chloride       PRN Meds:HYDROmorphone, HYDROmorphone, melatonin, sodium chloride flush, sodium chloride, [Held by provider] potassium chloride **OR** [Held by provider] potassium alternative oral replacement **OR** [Held by provider] potassium chloride, magnesium sulfate, ondansetron **OR** ondansetron, [Held by provider] polyethylene glycol, [DISCONTINUED] acetaminophen **OR** acetaminophen, potassium chloride, albuterol sulfate HFA, tiZANidine    Allergies   Allergen Reactions    Oxycodone Anaphylaxis    Parsley Leaves Anaphylaxis, Hives, Itching and Shortness Of Breath    Codeine      Anaphylaxis     Morphine      Principal Problem:    Abdominal pain  Active Problems:    Abnormal radiologic density    Intraabdominal mass    Anemia    Esophageal dysphagia  Resolved Problems:    * No resolved hospital problems. *    Blood pressure 126/75, pulse 57, temperature 98.3 F (36.8 C), temperature source Oral, resp. rate 18, height 1.65 m (5' 4.96"), weight 105.7 kg (233 lb 0.4 oz), SpO2 100 %.    Subjective  She  complained of mild to moderate pain in her midline incision.  She has been up out of bed to the bathroom to urinate and ambulating without much difficulty.  She thinks she passed a tiny amount of flatus and has not had a bowel movement.  No nausea or vomiting.  No fever or chills.    Objective  Abdomen soft, flat and with mild tenderness in the midline incision.  Dressing clean and dry.  Lab work did not reveal any leukocytosis.  Hemoglobin 10.3 and chemistry normal.  Pathology pending.    Assessment & Plan  Status post small bowel resection with removal of associated mesenteric lymphadenopathy.  Awaiting return of bowel function.  Keep n.p.o. except ice chips and sips of water with medications.  Encourage ambulation and incentive spirometry.    Lelon Frohlich, MD  11/20/2022

## 2022-11-20 NOTE — Plan of Care (Signed)
Problem: Discharge Planning  Goal: Discharge to home or other facility with appropriate resources  Outcome: Progressing  Flowsheets (Taken 11/20/2022 0830)  Discharge to home or other facility with appropriate resources:   Identify barriers to discharge with patient and caregiver   Arrange for needed discharge resources and transportation as appropriate   Identify discharge learning needs (meds, wound care, etc)     Problem: Pain  Goal: Verbalizes/displays adequate comfort level or baseline comfort level  Outcome: Progressing  Flowsheets (Taken 11/20/2022 1435)  Verbalizes/displays adequate comfort level or baseline comfort level:   Encourage patient to monitor pain and request assistance   Assess pain using appropriate pain scale   Administer analgesics based on type and severity of pain and evaluate response   Implement non-pharmacological measures as appropriate and evaluate response   Consider cultural and social influences on pain and pain management   Notify Licensed Independent Practitioner if interventions unsuccessful or patient reports new pain     Problem: Safety - Adult  Goal: Free from fall injury  Outcome: Progressing     Problem: Gastrointestinal - Adult  Goal: Minimal or absence of nausea and vomiting  Outcome: Progressing  Flowsheets (Taken 11/20/2022 0830)  Minimal or absence of nausea and vomiting: Administer IV fluids as ordered to ensure adequate hydration     Problem: Gastrointestinal - Adult  Goal: Maintains or returns to baseline bowel function  Outcome: Progressing  Flowsheets (Taken 11/20/2022 0830)  Maintains or returns to baseline bowel function: Assess bowel function     Problem: Gastrointestinal - Adult  Goal: Maintains adequate nutritional intake  Outcome: Progressing  Flowsheets (Taken 11/20/2022 0830)  Maintains adequate nutritional intake: Monitor percentage of each meal consumed     Problem: Anxiety  Goal: Will report anxiety at manageable levels  Description: INTERVENTIONS:  1. Administer  medication as ordered  2. Teach and rehearse alternative coping skills  3. Provide emotional support with 1:1 interaction with staff  Outcome: Progressing  Flowsheets (Taken 11/20/2022 0830)  Will report anxiety at manageable levels: Administer medication as ordered     Problem: Spiritual Care  Goal: Pt/Family able to move forward in process of forgiving self, others, and/or higher power  Description: INTERVENTIONS:  1. Assist patient/family to overcome blocks to healing by use of spiritual practices (prayer, meditation, guided imagery, reiki, breath work, Catering manager).  2. De-myth guilt and help patient/family identify possible irrational spiritual/cultural beliefs and values.  3. Explore possibilities of making amends & reconciliation with self, others, and/or a greater power.  4. Guide patient/family in identifying painful feelings of guilt.  5. Help patient/famiy explore and identify spiritual beliefs, cultural understandings or values that may help or hinder letting go of issue.  6. Help patient/family explore feelings of anger, bitterness, resentment.  7. Help patient/family identify and examine the situation in which these feelings are experienced.  8. Help patient/family identify destructive displacement of feelings onto other individuals.  9. Invite use of sacraments/rituals/ceremonies as appropriate (e.g. - confession, anointing, smudging).  10. Refer patient/family to formal counseling and/or to faith community for further support work.  Outcome: Progressing  Flowsheets (Taken 11/20/2022 0830)  Patient/family able to move forward in process of forgiving self, others, and/or higher power: Assist patient to overcome blocks to healing by use of spiritual practices (prayer, meditation, guided imagery, reiki, breath work, etc)     Problem: ABCDS Injury Assessment  Goal: Absence of physical injury  Outcome: Progressing

## 2022-11-21 LAB — SURGICAL PATHOLOGY REPORT

## 2022-11-21 MED FILL — ENOXAPARIN SODIUM 40 MG/0.4ML IJ SOSY: 40 MG/0.4ML | INTRAMUSCULAR | Qty: 0.4

## 2022-11-21 MED FILL — DILAUDID 1 MG/ML IJ SOLN: 1 MG/ML | INTRAMUSCULAR | Qty: 1

## 2022-11-21 MED FILL — BUPROPION HCL ER (XL) 150 MG PO TB24: 150 MG | ORAL | Qty: 1

## 2022-11-21 MED FILL — DILAUDID 1 MG/ML IJ SOLN: 1 MG/ML | INTRAMUSCULAR | Qty: 0.5

## 2022-11-21 MED FILL — PANTOPRAZOLE SODIUM 40 MG IV SOLR: 40 MG | INTRAVENOUS | Qty: 40

## 2022-11-21 MED FILL — DULOXETINE HCL 30 MG PO CPEP: 30 MG | ORAL | Qty: 1

## 2022-11-21 NOTE — Progress Notes (Signed)
Patient states has been passing little bit of flatus last night.  Denies any nausea.  Has been up and ambulating in her room and a little bit in the hallways yesterday.    Vital signs unremarkable and patient is afebrile  Adequate urine output    On examination patient moving around in bed without too much discomfort  Good lung excursion  Abdomen fairly soft and with just incisional discomfort  Incision site clean and dry bandage removed.    Electrolytes yesterday normal  Pathology report pending    Impression/recommendation  Patient post segmental small bowel resection and lymph node sampling of small bowel mesentery for multiple small nodules in the small bowel and large matted adenopathy in the root of her mesentery.  It surgery this was examined on the back table and these appear to be submucosal lesions.  Pathology report pending.    Patient appears to be starting to resolve her postoperative ileus and will start on a clear liquid diet today.

## 2022-11-21 NOTE — Plan of Care (Signed)
Problem: Discharge Planning  Goal: Discharge to home or other facility with appropriate resources  Outcome: Progressing     Problem: Pain  Goal: Verbalizes/displays adequate comfort level or baseline comfort level  Outcome: Progressing     Problem: Safety - Adult  Goal: Free from fall injury  Outcome: Progressing     Problem: Gastrointestinal - Adult  Goal: Minimal or absence of nausea and vomiting  Outcome: Progressing  Flowsheets (Taken 11/21/2022 0840)  Minimal or absence of nausea and vomiting:   Administer IV fluids as ordered to ensure adequate hydration   Administer ordered antiemetic medications as needed   Provide nonpharmacologic comfort measures as appropriate   Advance diet as tolerated, if ordered   Nutrition consult to assist patient with adequate nutrition and appropriate food choices  Goal: Maintains or returns to baseline bowel function  Outcome: Progressing  Flowsheets (Taken 11/21/2022 0840)  Maintains or returns to baseline bowel function:   Assess bowel function   Encourage oral fluids to ensure adequate hydration   Administer IV fluids as ordered to ensure adequate hydration   Administer ordered medications as needed   Encourage mobilization and activity   Nutrition consult to assist patient with appropriate food choices  Goal: Maintains adequate nutritional intake  Outcome: Progressing  Flowsheets (Taken 11/21/2022 0840)  Maintains adequate nutritional intake:   Monitor percentage of each meal consumed   Identify factors contributing to decreased intake, treat as appropriate   Assist with meals as needed   Monitor intake and output, weight and lab values   Obtain nutritional consult as needed     Problem: Anxiety  Goal: Will report anxiety at manageable levels  Description: INTERVENTIONS:  1. Administer medication as ordered  2. Teach and rehearse alternative coping skills  3. Provide emotional support with 1:1 interaction with staff  Outcome: Progressing  Flowsheets (Taken 11/21/2022  0840)  Will report anxiety at manageable levels:   Administer medication as ordered   Provide emotional support with 1:1 interaction with staff   Teach and rehearse alternative coping skills     Problem: Spiritual Care  Goal: Pt/Family able to move forward in process of forgiving self, others, and/or higher power  Description: INTERVENTIONS:  1. Assist patient/family to overcome blocks to healing by use of spiritual practices (prayer, meditation, guided imagery, reiki, breath work, Catering manager).  2. De-myth guilt and help patient/family identify possible irrational spiritual/cultural beliefs and values.  3. Explore possibilities of making amends & reconciliation with self, others, and/or a greater power.  4. Guide patient/family in identifying painful feelings of guilt.  5. Help patient/famiy explore and identify spiritual beliefs, cultural understandings or values that may help or hinder letting go of issue.  6. Help patient/family explore feelings of anger, bitterness, resentment.  7. Help patient/family identify and examine the situation in which these feelings are experienced.  8. Help patient/family identify destructive displacement of feelings onto other individuals.  9. Invite use of sacraments/rituals/ceremonies as appropriate (e.g. - confession, anointing, smudging).  10. Refer patient/family to formal counseling and/or to faith community for further support work.  Outcome: Progressing     Problem: ABCDS Injury Assessment  Goal: Absence of physical injury  Outcome: Progressing

## 2022-11-21 NOTE — Plan of Care (Signed)
Problem: Discharge Planning  Goal: Discharge to home or other facility with appropriate resources  11/21/2022 0249 by Dorothy Puffer, RN  Outcome: Progressing     Problem: Pain  Goal: Verbalizes/displays adequate comfort level or baseline comfort level  11/21/2022 0249 by Dorothy Puffer, RN  Outcome: Progressing     Problem: Safety - Adult  Goal: Free from fall injury  11/21/2022 0249 by Dorothy Puffer, RN  Outcome: Progressing     Problem: Gastrointestinal - Adult  Goal: Minimal or absence of nausea and vomiting  11/21/2022 0249 by Dorothy Puffer, RN  Outcome: Progressing     Problem: Gastrointestinal - Adult  Goal: Maintains or returns to baseline bowel function  11/21/2022 0249 by Dorothy Puffer, RN  Outcome: Progressing     Problem: Gastrointestinal - Adult  Goal: Maintains adequate nutritional intake  11/21/2022 0249 by Dorothy Puffer, RN  Outcome: Progressing     Problem: Anxiety  Goal: Will report anxiety at manageable levels  Description: INTERVENTIONS:  1. Administer medication as ordered  2. Teach and rehearse alternative coping skills  3. Provide emotional support with 1:1 interaction with staff  11/21/2022 0249 by Dorothy Puffer, RN  Outcome: Progressing     Problem: Spiritual Care  Goal: Pt/Family able to move forward in process of forgiving self, others, and/or higher power  Description: INTERVENTIONS:  1. Assist patient/family to overcome blocks to healing by use of spiritual practices (prayer, meditation, guided imagery, reiki, breath work, Catering manager).  2. De-myth guilt and help patient/family identify possible irrational spiritual/cultural beliefs and values.  3. Explore possibilities of making amends & reconciliation with self, others, and/or a greater power.  4. Guide patient/family in identifying painful feelings of guilt.  5. Help patient/famiy explore and identify spiritual beliefs, cultural understandings or values that may help or hinder letting go of issue.  6. Help patient/family explore  feelings of anger, bitterness, resentment.  7. Help patient/family identify and examine the situation in which these feelings are experienced.  8. Help patient/family identify destructive displacement of feelings onto other individuals.  9. Invite use of sacraments/rituals/ceremonies as appropriate (e.g. - confession, anointing, smudging).  10. Refer patient/family to formal counseling and/or to faith community for further support work.  11/21/2022 0249 by Dorothy Puffer, RN  Outcome: Progressing     Problem: ABCDS Injury Assessment  Goal: Absence of physical injury  11/21/2022 0249 by Dorothy Puffer, RN  Outcome: Progressing

## 2022-11-22 MED ORDER — HYDROMORPHONE HCL 1 MG/ML IJ SOLN
1 | INTRAMUSCULAR | Status: DC | PRN
Start: 2022-11-22 — End: 2022-11-24
  Administered 2022-11-22 – 2022-11-23 (×7): 0.5 mg via INTRAVENOUS

## 2022-11-22 MED ORDER — TRAMADOL HCL 50 MG PO TABS
50 | Freq: Four times a day (QID) | ORAL | Status: DC | PRN
Start: 2022-11-22 — End: 2022-11-24
  Administered 2022-11-22 – 2022-11-24 (×8): 50 mg via ORAL

## 2022-11-22 MED FILL — ENOXAPARIN SODIUM 40 MG/0.4ML IJ SOSY: 40 MG/0.4ML | INTRAMUSCULAR | Qty: 0.4

## 2022-11-22 MED FILL — DILAUDID 1 MG/ML IJ SOLN: 1 MG/ML | INTRAMUSCULAR | Qty: 1

## 2022-11-22 MED FILL — TRAMADOL HCL 50 MG PO TABS: 50 MG | ORAL | Qty: 1

## 2022-11-22 MED FILL — BUPROPION HCL ER (XL) 150 MG PO TB24: 150 MG | ORAL | Qty: 1

## 2022-11-22 MED FILL — DULOXETINE HCL 30 MG PO CPEP: 30 MG | ORAL | Qty: 1

## 2022-11-22 MED FILL — DILAUDID 1 MG/ML IJ SOLN: 1 MG/ML | INTRAMUSCULAR | Qty: 0.5

## 2022-11-22 MED FILL — PANTOPRAZOLE SODIUM 40 MG IV SOLR: 40 MG | INTRAVENOUS | Qty: 40

## 2022-11-22 NOTE — Plan of Care (Signed)
Problem: Discharge Planning  Goal: Discharge to home or other facility with appropriate resources  11/22/2022 0120 by Dorothy Puffer, RN  Outcome: Progressing     Problem: Pain  Goal: Verbalizes/displays adequate comfort level or baseline comfort level  11/22/2022 0120 by Dorothy Puffer, RN  Outcome: Progressing     Problem: Safety - Adult  Goal: Free from fall injury  11/22/2022 0120 by Dorothy Puffer, RN  Outcome: Progressing     Problem: Gastrointestinal - Adult  Goal: Minimal or absence of nausea and vomiting  11/22/2022 0120 by Dorothy Puffer, RN  Outcome: Progressing     Problem: Gastrointestinal - Adult  Goal: Maintains or returns to baseline bowel function  11/22/2022 0120 by Dorothy Puffer, RN  Outcome: Progressing     Problem: Gastrointestinal - Adult  Goal: Maintains adequate nutritional intake  11/22/2022 0120 by Dorothy Puffer, RN  Outcome: Progressing     Problem: Anxiety  Goal: Will report anxiety at manageable levels  Description: INTERVENTIONS:  1. Administer medication as ordered  2. Teach and rehearse alternative coping skills  3. Provide emotional support with 1:1 interaction with staff  11/22/2022 0120 by Dorothy Puffer, RN  Outcome: Progressing     Problem: Spiritual Care  Goal: Pt/Family able to move forward in process of forgiving self, others, and/or higher power  Description: INTERVENTIONS:  1. Assist patient/family to overcome blocks to healing by use of spiritual practices (prayer, meditation, guided imagery, reiki, breath work, Catering manager).  2. De-myth guilt and help patient/family identify possible irrational spiritual/cultural beliefs and values.  3. Explore possibilities of making amends & reconciliation with self, others, and/or a greater power.  4. Guide patient/family in identifying painful feelings of guilt.  5. Help patient/famiy explore and identify spiritual beliefs, cultural understandings or values that may help or hinder letting go of issue.  6. Help patient/family explore  feelings of anger, bitterness, resentment.  7. Help patient/family identify and examine the situation in which these feelings are experienced.  8. Help patient/family identify destructive displacement of feelings onto other individuals.  9. Invite use of sacraments/rituals/ceremonies as appropriate (e.g. - confession, anointing, smudging).  10. Refer patient/family to formal counseling and/or to faith community for further support work.  11/22/2022 0120 by Dorothy Puffer, RN  Outcome: Progressing     Problem: ABCDS Injury Assessment  Goal: Absence of physical injury  11/22/2022 0120 by Dorothy Puffer, RN  Outcome: Progressing

## 2022-11-22 NOTE — Progress Notes (Signed)
Monica Wood is a 47 y.o. female patient.  She is 3 days status post laparotomy, small bowel resection with removal of adjacent mesenteric lymphadenopathy.    1. Abdominal pain, unspecified abdominal location    2. Intraabdominal mass    3. Mesenteric mass      Past Medical History:   Diagnosis Date    Bursitis     Fibromyalgia     Scoliosis     Tendinitis      No past surgical history pertinent negatives on file.  Scheduled Meds:   sodium chloride flush  5-40 mL IntraVENous 2 times per day    enoxaparin  40 mg SubCUTAneous Daily    pantoprazole (PROTONIX) 40 mg in sodium chloride (PF) 0.9 % 10 mL injection  40 mg IntraVENous Daily    buPROPion  150 mg Oral QAM    DULoxetine  30 mg Oral Daily     Continuous Infusions:   sodium chloride       PRN Meds:traMADol, HYDROmorphone, HYDROmorphone, melatonin, sodium chloride flush, sodium chloride, [Held by provider] potassium chloride **OR** [Held by provider] potassium alternative oral replacement **OR** [Held by provider] potassium chloride, magnesium sulfate, ondansetron **OR** ondansetron, [Held by provider] polyethylene glycol, [DISCONTINUED] acetaminophen **OR** acetaminophen, potassium chloride, albuterol sulfate HFA, tiZANidine    Allergies   Allergen Reactions    Oxycodone Anaphylaxis    Parsley Leaves Anaphylaxis, Hives, Itching and Shortness Of Breath    Codeine      Anaphylaxis     Morphine      Principal Problem:    Abdominal pain  Active Problems:    Abnormal radiologic density    Intraabdominal mass    Anemia    Esophageal dysphagia  Resolved Problems:    * No resolved hospital problems. *    Blood pressure (!) 105/54, pulse 59, temperature 97.9 F (36.6 C), temperature source Oral, resp. rate 18, height 1.65 m (5' 4.96"), weight 105.7 kg (233 lb 0.4 oz), SpO2 99 %.    Subjective  She is passing flatus but has not yet had a bowel movement.  She is tolerating a clear liquid diet without nausea.  She reported mild to moderate pain in her incision.  Ambulating  in the halls.    Objective  Abdomen soft, flat and mildly tender at midline incision, which was clean and dry.  No new labs.  Pathology pending    Assessment & Plan  Status post partial small bowel resection with adjacent lymph node sampling  I will advance her to a full liquid diet.  I offered her Ultram orally for moderate pain and told her to wean off IV analgesics.  Continue ambulation and incentive spirometry.    Lelon Frohlich, MD  11/22/2022

## 2022-11-22 NOTE — Plan of Care (Signed)
Problem: Discharge Planning  Goal: Discharge to home or other facility with appropriate resources  11/22/2022 2158 by Philipp Deputy, RN  Outcome: Progressing  Flowsheets (Taken 11/22/2022 2000)  Discharge to home or other facility with appropriate resources:   Identify barriers to discharge with patient and caregiver   Arrange for needed discharge resources and transportation as appropriate   Identify discharge learning needs (meds, wound care, etc)     Problem: Pain  Goal: Verbalizes/displays adequate comfort level or baseline comfort level  11/22/2022 2158 by Philipp Deputy, RN  Outcome: Progressing     Problem: Safety - Adult  Goal: Free from fall injury  11/22/2022 2158 by Philipp Deputy, RN  Outcome: Progressing     Problem: Gastrointestinal - Adult  Goal: Minimal or absence of nausea and vomiting  11/22/2022 2158 by Philipp Deputy, RN  Outcome: Progressing  Flowsheets (Taken 11/22/2022 2000)  Minimal or absence of nausea and vomiting: Provide nonpharmacologic comfort measures as appropriate     Problem: Gastrointestinal - Adult  Goal: Maintains or returns to baseline bowel function  11/22/2022 2158 by Philipp Deputy, RN  Outcome: Progressing  Flowsheets (Taken 11/22/2022 2000)  Maintains or returns to baseline bowel function: Assess bowel function     Problem: Gastrointestinal - Adult  Goal: Maintains adequate nutritional intake  11/22/2022 2158 by Philipp Deputy, RN  Outcome: Progressing  Flowsheets (Taken 11/22/2022 2000)  Maintains adequate nutritional intake: Monitor intake and output, weight and lab values     Problem: Anxiety  Goal: Will report anxiety at manageable levels  Description: INTERVENTIONS:  1. Administer medication as ordered  2. Teach and rehearse alternative coping skills  3. Provide emotional support with 1:1 interaction with staff  11/22/2022 2158 by Philipp Deputy, RN  Outcome: Progressing  Flowsheets (Taken 11/22/2022 2000)  Will report anxiety at manageable levels: Provide emotional  support with 1:1 interaction with staff     Problem: Spiritual Care  Goal: Pt/Family able to move forward in process of forgiving self, others, and/or higher power  Description: INTERVENTIONS:  1. Assist patient/family to overcome blocks to healing by use of spiritual practices (prayer, meditation, guided imagery, reiki, breath work, Catering manager).  2. De-myth guilt and help patient/family identify possible irrational spiritual/cultural beliefs and values.  3. Explore possibilities of making amends & reconciliation with self, others, and/or a greater power.  4. Guide patient/family in identifying painful feelings of guilt.  5. Help patient/famiy explore and identify spiritual beliefs, cultural understandings or values that may help or hinder letting go of issue.  6. Help patient/family explore feelings of anger, bitterness, resentment.  7. Help patient/family identify and examine the situation in which these feelings are experienced.  8. Help patient/family identify destructive displacement of feelings onto other individuals.  9. Invite use of sacraments/rituals/ceremonies as appropriate (e.g. - confession, anointing, smudging).  10. Refer patient/family to formal counseling and/or to faith community for further support work.  11/22/2022 2158 by Philipp Deputy, RN  Outcome: Progressing     Problem: ABCDS Injury Assessment  Goal: Absence of physical injury  11/22/2022 2158 by Philipp Deputy, RN  Outcome: Progressing

## 2022-11-22 NOTE — Plan of Care (Signed)
Problem: Discharge Planning  Goal: Discharge to home or other facility with appropriate resources  Outcome: Progressing  Flowsheets (Taken 11/22/2022 0855)  Discharge to home or other facility with appropriate resources:   Identify barriers to discharge with patient and caregiver   Arrange for needed discharge resources and transportation as appropriate   Identify discharge learning needs (meds, wound care, etc)   Refer to discharge planning if patient needs post-hospital services based on physician order or complex needs related to functional status, cognitive ability or social support system     Problem: Pain  Goal: Verbalizes/displays adequate comfort level or baseline comfort level  Outcome: Progressing   -Medicated for pain as ordered, as needed.     Problem: Safety - Adult  Goal: Free from fall injury  Outcome: Progressing  Flowsheets (Taken 11/22/2022 1001)  Free From Fall Injury: Instruct family/caregiver on patient safety   - Safety maintained.     Problem: Gastrointestinal - Adult  Goal: Minimal or absence of nausea and vomiting  Outcome: Progressing  Flowsheets (Taken 11/22/2022 0855)  Minimal or absence of nausea and vomiting:   Administer IV fluids as ordered to ensure adequate hydration   Administer ordered antiemetic medications as needed   Provide nonpharmacologic comfort measures as appropriate   Advance diet as tolerated, if ordered     Problem: Gastrointestinal - Adult  Goal: Maintains or returns to baseline bowel function  Outcome: Progressing  Flowsheets (Taken 11/22/2022 0855)  Maintains or returns to baseline bowel function:   Assess bowel function   Encourage oral fluids to ensure adequate hydration   Administer IV fluids as ordered to ensure adequate hydration   Administer ordered medications as needed   Encourage mobilization and activity     Problem: Gastrointestinal - Adult  Goal: Maintains adequate nutritional intake  Outcome: Progressing  Flowsheets (Taken 11/22/2022 0855)  Maintains  adequate nutritional intake:   Monitor percentage of each meal consumed   Identify factors contributing to decreased intake, treat as appropriate   Monitor intake and output, weight and lab values     Problem: Anxiety  Goal: Will report anxiety at manageable levels  Description: INTERVENTIONS:  1. Administer medication as ordered  2. Teach and rehearse alternative coping skills  3. Provide emotional support with 1:1 interaction with staff  Outcome: Progressing  Flowsheets (Taken 11/22/2022 0855)  Will report anxiety at manageable levels:   Administer medication as ordered   Teach and rehearse alternative coping skills   Provide emotional support with 1:1 interaction with staff  - Patient coping appropriately.      Problem: Spiritual Care  Goal: Pt/Family able to move forward in process of forgiving self, others, and/or higher power  Description: INTERVENTIONS:  1. Assist patient/family to overcome blocks to healing by use of spiritual practices (prayer, meditation, guided imagery, reiki, breath work, Catering manager).  2. De-myth guilt and help patient/family identify possible irrational spiritual/cultural beliefs and values.  3. Explore possibilities of making amends & reconciliation with self, others, and/or a greater power.  4. Guide patient/family in identifying painful feelings of guilt.  5. Help patient/famiy explore and identify spiritual beliefs, cultural understandings or values that may help or hinder letting go of issue.  6. Help patient/family explore feelings of anger, bitterness, resentment.  7. Help patient/family identify and examine the situation in which these feelings are experienced.  8. Help patient/family identify destructive displacement of feelings onto other individuals.  9. Invite use of sacraments/rituals/ceremonies as appropriate (e.g. - confession, anointing, smudging).  10.  Refer patient/family to formal counseling and/or to faith community for further support work.  Outcome: Progressing      Problem: ABCDS Injury Assessment  Goal: Absence of physical injury  Outcome: Progressing  Flowsheets (Taken 11/22/2022 1001)  Absence of Physical Injury: Implement safety measures based on patient assessment

## 2022-11-23 MED ORDER — ACETAMINOPHEN 325 MG PO TABS
325 | Freq: Four times a day (QID) | ORAL | Status: DC | PRN
Start: 2022-11-23 — End: 2022-11-24

## 2022-11-23 MED ORDER — DULOXETINE HCL 30 MG PO CPEP
30 | Freq: Every day | ORAL | Status: DC
Start: 2022-11-23 — End: 2022-11-24
  Administered 2022-11-23 – 2022-11-24 (×2): 90 mg via ORAL

## 2022-11-23 MED FILL — DILAUDID 1 MG/ML IJ SOLN: 1 MG/ML | INTRAMUSCULAR | Qty: 0.5

## 2022-11-23 MED FILL — PANTOPRAZOLE SODIUM 40 MG IV SOLR: 40 MG | INTRAVENOUS | Qty: 40

## 2022-11-23 MED FILL — TRAMADOL HCL 50 MG PO TABS: 50 MG | ORAL | Qty: 1

## 2022-11-23 MED FILL — BUPROPION HCL ER (XL) 150 MG PO TB24: 150 MG | ORAL | Qty: 1

## 2022-11-23 MED FILL — MELATONIN 3 MG PO TABS: 3 MG | ORAL | Qty: 2

## 2022-11-23 MED FILL — ENOXAPARIN SODIUM 40 MG/0.4ML IJ SOSY: 40 MG/0.4ML | INTRAMUSCULAR | Qty: 0.4

## 2022-11-23 MED FILL — DULOXETINE HCL 30 MG PO CPEP: 30 MG | ORAL | Qty: 3

## 2022-11-23 NOTE — Plan of Care (Signed)
Problem: Discharge Planning  Goal: Discharge to home or other facility with appropriate resources  11/23/2022 1950 by Philipp Deputy, RN  Outcome: Progressing  Flowsheets (Taken 11/23/2022 1946)  Discharge to home or other facility with appropriate resources:   Identify barriers to discharge with patient and caregiver   Arrange for needed discharge resources and transportation as appropriate   Identify discharge learning needs (meds, wound care, etc)     Problem: Pain  Goal: Verbalizes/displays adequate comfort level or baseline comfort level  11/23/2022 1950 by Philipp Deputy, RN  Outcome: Progressing     Problem: Safety - Adult  Goal: Free from fall injury  11/23/2022 1950 by Philipp Deputy, RN  Outcome: Progressing     Problem: Gastrointestinal - Adult  Goal: Minimal or absence of nausea and vomiting  11/23/2022 1950 by Philipp Deputy, RN  Outcome: Progressing  Flowsheets (Taken 11/23/2022 1946)  Minimal or absence of nausea and vomiting: Provide nonpharmacologic comfort measures as appropriate     Problem: Gastrointestinal - Adult  Goal: Maintains or returns to baseline bowel function  11/23/2022 1950 by Philipp Deputy, RN  Outcome: Progressing  Flowsheets (Taken 11/23/2022 1946)  Maintains or returns to baseline bowel function:   Assess bowel function   Encourage oral fluids to ensure adequate hydration   Administer ordered medications as needed   Encourage mobilization and activity     Problem: Gastrointestinal - Adult  Goal: Maintains adequate nutritional intake  11/23/2022 1950 by Philipp Deputy, RN  Outcome: Progressing  Flowsheets (Taken 11/23/2022 1946)  Maintains adequate nutritional intake:   Monitor percentage of each meal consumed   Identify factors contributing to decreased intake, treat as appropriate     Problem: Anxiety  Goal: Will report anxiety at manageable levels  Description: INTERVENTIONS:  1. Administer medication as ordered  2. Teach and rehearse alternative coping skills  3. Provide  emotional support with 1:1 interaction with staff  11/23/2022 1950 by Philipp Deputy, RN  Outcome: Progressing  Flowsheets (Taken 11/23/2022 1946)  Will report anxiety at manageable levels:   Administer medication as ordered   Teach and rehearse alternative coping skills   Provide emotional support with 1:1 interaction with staff     Problem: Spiritual Care  Goal: Pt/Family able to move forward in process of forgiving self, others, and/or higher power  Description: INTERVENTIONS:  1. Assist patient/family to overcome blocks to healing by use of spiritual practices (prayer, meditation, guided imagery, reiki, breath work, Catering manager).  2. De-myth guilt and help patient/family identify possible irrational spiritual/cultural beliefs and values.  3. Explore possibilities of making amends & reconciliation with self, others, and/or a greater power.  4. Guide patient/family in identifying painful feelings of guilt.  5. Help patient/famiy explore and identify spiritual beliefs, cultural understandings or values that may help or hinder letting go of issue.  6. Help patient/family explore feelings of anger, bitterness, resentment.  7. Help patient/family identify and examine the situation in which these feelings are experienced.  8. Help patient/family identify destructive displacement of feelings onto other individuals.  9. Invite use of sacraments/rituals/ceremonies as appropriate (e.g. - confession, anointing, smudging).  10. Refer patient/family to formal counseling and/or to faith community for further support work.  11/23/2022 1950 by Philipp Deputy, RN  Outcome: Progressing     Problem: ABCDS Injury Assessment  Goal: Absence of physical injury  11/23/2022 1950 by Philipp Deputy, RN  Outcome: Progressing

## 2022-11-23 NOTE — Plan of Care (Signed)
Problem: Discharge Planning  Goal: Discharge to home or other facility with appropriate resources  Outcome: Progressing  Flowsheets (Taken 11/23/2022 0850)  Discharge to home or other facility with appropriate resources:   Identify barriers to discharge with patient and caregiver   Arrange for needed discharge resources and transportation as appropriate   Identify discharge learning needs (meds, wound care, etc)   Refer to discharge planning if patient needs post-hospital services based on physician order or complex needs related to functional status, cognitive ability or social support system     Problem: Pain  Goal: Verbalizes/displays adequate comfort level or baseline comfort level  Outcome: Progressing   - medicated for pain as ordered, as needed.     Problem: Safety - Adult  Goal: Free from fall injury  Outcome: Progressing  Flowsheets (Taken 11/23/2022 0850)  Free From Fall Injury: Instruct family/caregiver on patient safety   - Safety maintained.     Problem: Gastrointestinal - Adult  Goal: Minimal or absence of nausea and vomiting  Outcome: Progressing  Flowsheets (Taken 11/23/2022 0850)  Minimal or absence of nausea and vomiting:   Administer ordered antiemetic medications as needed   Provide nonpharmacologic comfort measures as appropriate   Advance diet as tolerated, if ordered   - Patient denied nausea.     Problem: Gastrointestinal - Adult  Goal: Maintains or returns to baseline bowel function  Outcome: Progressing  Flowsheets (Taken 11/23/2022 0850)  Maintains or returns to baseline bowel function:   Assess bowel function   Encourage oral fluids to ensure adequate hydration   Administer ordered medications as needed   Encourage mobilization and activity   - active bowel sounds.     Problem: Gastrointestinal - Adult  Goal: Maintains adequate nutritional intake  Outcome: Progressing  Flowsheets (Taken 11/23/2022 0850)  Maintains adequate nutritional intake:   Monitor percentage of each meal consumed    Identify factors contributing to decreased intake, treat as appropriate   Monitor intake and output, weight and lab values     Problem: Anxiety  Goal: Will report anxiety at manageable levels  Description: INTERVENTIONS:  1. Administer medication as ordered  2. Teach and rehearse alternative coping skills  3. Provide emotional support with 1:1 interaction with staff  Outcome: Progressing  Flowsheets (Taken 11/23/2022 0850)  Will report anxiety at manageable levels:   Teach and rehearse alternative coping skills   Provide emotional support with 1:1 interaction with staff     Problem: Spiritual Care  Goal: Pt/Family able to move forward in process of forgiving self, others, and/or higher power  Description: INTERVENTIONS:  1. Assist patient/family to overcome blocks to healing by use of spiritual practices (prayer, meditation, guided imagery, reiki, breath work, Catering manager).  2. De-myth guilt and help patient/family identify possible irrational spiritual/cultural beliefs and values.  3. Explore possibilities of making amends & reconciliation with self, others, and/or a greater power.  4. Guide patient/family in identifying painful feelings of guilt.  5. Help patient/famiy explore and identify spiritual beliefs, cultural understandings or values that may help or hinder letting go of issue.  6. Help patient/family explore feelings of anger, bitterness, resentment.  7. Help patient/family identify and examine the situation in which these feelings are experienced.  8. Help patient/family identify destructive displacement of feelings onto other individuals.  9. Invite use of sacraments/rituals/ceremonies as appropriate (e.g. - confession, anointing, smudging).  10. Refer patient/family to formal counseling and/or to faith community for further support work.  Outcome: Progressing

## 2022-11-23 NOTE — Progress Notes (Signed)
Monica Wood is a 47 y.o. female patient.  She is 4 days status post laparotomy, small bowel resection, with removal of adjacent mesenteric lymphadenopathy.    1. Abdominal pain, unspecified abdominal location    2. Intraabdominal mass    3. Mesenteric mass      Past Medical History:   Diagnosis Date    Bursitis     Fibromyalgia     Scoliosis     Tendinitis      No past surgical history pertinent negatives on file.  Scheduled Meds:   DULoxetine  90 mg Oral Daily    sodium chloride flush  5-40 mL IntraVENous 2 times per day    enoxaparin  40 mg SubCUTAneous Daily    pantoprazole (PROTONIX) 40 mg in sodium chloride (PF) 0.9 % 10 mL injection  40 mg IntraVENous Daily    buPROPion  150 mg Oral QAM     Continuous Infusions:   sodium chloride       PRN Meds:acetaminophen, traMADol, HYDROmorphone, melatonin, sodium chloride flush, sodium chloride, [Held by provider] potassium chloride **OR** [Held by provider] potassium alternative oral replacement **OR** [Held by provider] potassium chloride, magnesium sulfate, ondansetron **OR** ondansetron, [Held by provider] polyethylene glycol, potassium chloride, albuterol sulfate HFA, tiZANidine    Allergies   Allergen Reactions    Oxycodone Anaphylaxis    Parsley Leaves Anaphylaxis, Hives, Itching and Shortness Of Breath    Codeine      Anaphylaxis     Morphine      Principal Problem:    Abdominal pain  Active Problems:    Abnormal radiologic density    Intraabdominal mass    Anemia    Esophageal dysphagia  Resolved Problems:    * No resolved hospital problems. *    Blood pressure (!) 99/54, pulse 68, temperature 98.5 F (36.9 C), resp. rate 16, height 1.65 m (5' 4.96"), weight 105.7 kg (233 lb 0.4 oz), SpO2 100 %.    Subjective  She is passing flatus and has passed a small amount of stool.  She is tolerating a full liquid diet.  Mild to moderate pain in her incision.  Ambulating.    Objective  Abdomen soft, flat and mildly tender at midline incision, which was clean and dry.   Pathology pending.    Assessment & Plan  4 days status post small bowel resection with adjacent lymph node sampling  I will allow her a regular diet.  Tylenol for mild pain, tramadol for moderate pain and Dilaudid for breakthrough severe pain as needed.  Anticipate discharge home tomorrow.    Lelon Frohlich, MD  11/23/2022

## 2022-11-24 ENCOUNTER — Inpatient Hospital Stay: Payer: PRIVATE HEALTH INSURANCE | Primary: Nurse Practitioner

## 2022-11-24 MED ORDER — TRAMADOL HCL 50 MG PO TABS
50 | ORAL_TABLET | Freq: Four times a day (QID) | ORAL | 0 refills | Status: AC | PRN
Start: 2022-11-24 — End: 2022-11-29

## 2022-11-24 MED FILL — TRAMADOL HCL 50 MG PO TABS: 50 MG | ORAL | Qty: 1

## 2022-11-24 MED FILL — CEFAZOLIN SODIUM 2 G IJ SOLR: 2 g | INTRAMUSCULAR | Qty: 2000

## 2022-11-24 MED FILL — BUPROPION HCL ER (XL) 150 MG PO TB24: 150 MG | ORAL | Qty: 1

## 2022-11-24 MED FILL — PANTOPRAZOLE SODIUM 40 MG IV SOLR: 40 MG | INTRAVENOUS | Qty: 40

## 2022-11-24 MED FILL — TIZANIDINE HCL 4 MG PO TABS: 4 MG | ORAL | Qty: 1

## 2022-11-24 MED FILL — DULOXETINE HCL 30 MG PO CPEP: 30 MG | ORAL | Qty: 3

## 2022-11-24 MED FILL — DILAUDID 1 MG/ML IJ SOLN: 1 MG/ML | INTRAMUSCULAR | Qty: 0.5

## 2022-11-24 NOTE — Progress Notes (Signed)
Monica Wood is a 47 y.o. female patient.  She is 5 days status post laparotomy, small bowel resection, with removal of adjacent mesenteric lymphadenopathy.  Pathology report is still pending.    1. Abdominal pain, unspecified abdominal location    2. Intraabdominal mass    3. Mesenteric mass      Past Medical History:   Diagnosis Date    Bursitis     Fibromyalgia     Scoliosis     Tendinitis      No past surgical history pertinent negatives on file.  Scheduled Meds:   DULoxetine  90 mg Oral Daily    sodium chloride flush  5-40 mL IntraVENous 2 times per day    enoxaparin  40 mg SubCUTAneous Daily    pantoprazole (PROTONIX) 40 mg in sodium chloride (PF) 0.9 % 10 mL injection  40 mg IntraVENous Daily    buPROPion  150 mg Oral QAM     Continuous Infusions:   sodium chloride       PRN Meds:acetaminophen, traMADol, HYDROmorphone, melatonin, sodium chloride flush, sodium chloride, [Held by provider] potassium chloride **OR** [Held by provider] potassium alternative oral replacement **OR** [Held by provider] potassium chloride, magnesium sulfate, ondansetron **OR** ondansetron, [Held by provider] polyethylene glycol, potassium chloride, albuterol sulfate HFA, tiZANidine    Allergies   Allergen Reactions    Oxycodone Anaphylaxis    Parsley Leaves Anaphylaxis, Hives, Itching and Shortness Of Breath    Codeine      Anaphylaxis     Morphine      Principal Problem:    Abdominal pain  Active Problems:    Abnormal radiologic density    Intraabdominal mass    Anemia    Esophageal dysphagia  Resolved Problems:    * No resolved hospital problems. *    Blood pressure (!) 102/57, pulse 66, temperature 98.3 F (36.8 C), temperature source Oral, resp. rate 16, height 1.65 m (5' 4.96"), weight 105.7 kg (233 lb 0.4 oz), SpO2 95 %.    Subjective  She is passing flatus and small amounts of stool.  She is tolerating regular food.  Mild to moderate pain in her incision, controlled with Ultram.  Ambulating in the  halls.    Objective  Abdomen soft, flat and mildly tender at midline incision, which was clean and dry.  Pathology pending.    Assessment & Plan  5 days status post small bowel resection with adjacent lymph node sampling.  As she is tolerating regular food and her pain is controlled with tramadol I will allow the patient to go home.  Follow-up with Dr. Cliffton Asters in 1 week.    Lelon Frohlich, MD  11/24/2022

## 2022-11-24 NOTE — Other (Signed)
[]   [facility] &  Therapy  [address]  [phone] []  [facility]  Outpatient Rehabilitation &  Therapy  [address]  P: [phone]  F: [phone] [x]  [facility]  Outpatient Rehabilitation &  Therapy  [address] [place]: [phone]  F: [phone] []  [facility]  Outpatient Rehabilitation &  Therapy  [address]  [phone] []  [facility]  Outpatient Rehabilitation &  Therapy  [address] [place] [address]   [place]: [phone]  F: [phone]  []  [facility] - [address]  Outpatient Rehabilitation &  Therapy  [address]  P: [phone]  F: [phone] []  [facility]  Outpatient Rehabilitation &  Therapy  [address]  P: [phone]  F: [phone] []  [facility] &  Therapy  [address]  [place]: [phone]  F: [phone] []  [facility]  Outpatient Rehabilitation &  Therapy  [address]  [place]: [phone]  F: [phone]  []  [facility] &  Therapy  [address]  [place]: [phone]  F: [phone]     Therapy Cancel/No Show note    Date: 11/24/2022  Patient: Monica Wood  DOB: 1975/12/30  MRN: [id number]    Cancels/No Shows to date: 3/1    For today's appointment patient:    [x]   Cancelled    []  Rescheduled appointment    []  No-show     Reason given by patient:    []   Patient ill    []   Conflicting appointment    []  No transportation      []  Conflict with work    []  No reason given    []  Weather related    []  COVID-19    [x]  Other:   Pt recently hospitalized and had abdominal surg. Will place pt on hold at this time   Comments:        []  Next appointment was confirmed    Electronically signed by: Lorrin Jackson, PT

## 2022-11-24 NOTE — Discharge Summary (Addendum)
Surgery Discharge Summary    Patient Identification  Monica Wood is a 47 y.o. female.  DOB:  08-Apr-1976  Admit Date:  11/16/2022    Discharge date:   No discharge date for patient encounter.                                   Disposition: home    Discharge Diagnoses:   Patient Active Problem List   Diagnosis    Change in vision    Obesity (BMI 35.0-39.9 without comorbidity)    Fibromyalgia    Chronic bilateral low back pain without sciatica    Bursitis    Contusion of foot    Depression    Elevated C-reactive protein (CRP)    Impingement syndrome of left shoulder region    Impingement syndrome of right shoulder region    Osteoarthritis of spine with radiculopathy, lumbar region    Restless leg syndrome    Osteoarthritis of spine with radiculopathy, cervical region    Trochanteric bursitis of both hips    Vitamin D deficiency    Abdominal pain    Abnormal radiologic density    Intraabdominal mass    Anemia    Esophageal dysphagia       CONDITION: good    Consults: none    Surgery: Laparotomy with small bowel resection and removal of adjacent mesenteric lymph nodes    Patient Instructions:   Activity: no heavy lifting, pushing, pulling for 6 weeks, no driving for 1 week or while on analgesics  Diet: As tolerated  Follow-up with Dr Christy Gentles in 1-2 weeks.  May shower    Discharge Medications:        Medication List        START taking these medications      traMADol 50 MG tablet  Commonly known as: Ultram  Take 1 tablet by mouth every 6 hours as needed for Pain for up to 5 days. Intended supply: 3 days. Take lowest dose possible to manage pain Max Daily Amount: 200 mg            CONTINUE taking these medications      albuterol sulfate HFA 108 (90 Base) MCG/ACT inhaler  Commonly known as: Proventil HFA  Inhale 2 puffs into the lungs every 6 hours as needed for Wheezing or Shortness of Breath     buPROPion 150 MG extended release tablet  Commonly known as: WELLBUTRIN XL  Take 1 tablet by mouth every morning     *  DULoxetine 30 MG extended release capsule  Commonly known as: CYMBALTA  Take 1 capsule by mouth daily     * DULoxetine 60 MG extended release capsule  Commonly known as: CYMBALTA  take 1 capsule by mouth once daily     famotidine 40 MG tablet  Commonly known as: PEPCID  Take 1 tablet by mouth every evening     tiZANidine 2 MG tablet  Commonly known as: ZANAFLEX  Take 1 tablet by mouth daily as needed (back muscle)     TRAZODONE HCL PO           * This list has 2 medication(s) that are the same as other medications prescribed for you. Read the directions carefully, and ask your doctor or other care provider to review them with you.  Where to Get Your Medications        These medications were sent to [id number] [place], [place] - [address] - P [phone] Carmon Ginsberg [phone]  [address] [place] [address]      Phone: [phone]   traMADol 50 MG tablet          HPI and Hospital Course:     Pt admitted and underwent an upper and lower endoscopy, which revealed mild gastritis and a colonic polyp.  Subsequently she underwent an exploratory laparotomy with a small bowel resection and removal of adjacent enlarged mesenteric lymph nodes.    She tolerated this surgery and the post op period well.  She was allowed a clear liquid diet a couple days after the surgery, which was gradually advanced to a regular diet.  She was passing flatus and small amounts of stool by the time of discharge.    She is discharged to home with normal a temperature and vitals, tolerating an oral diet well, and having normal bowel activity.  Her pathology from the bowel resection was pending at the time of discharge.    Follow up is planned, and all questions have been answered.    Lelon Frohlich, MD    [facility]

## 2022-11-24 NOTE — Discharge Instructions (Signed)
No lifting heavier than 20 pounds for the next 3 to 4 weeks  May shower.  May drive by the end of this week

## 2022-11-24 NOTE — Other (Signed)
Utilization Reviews       5/11 csr   Last Updated by Vedia Coffer, RN on 11/23/2022 1206     Review Status Created By   In Isabel Caprice, RN       Review Type Associated Date   -- 11/23/2022      Criteria Review   DATE: 5/11 inpt day 2  TYPE OF BED: med surg        PERTINENT UPDATES:  She is passing flatus but has not yet had a bowel movement.   She reported mild to moderate pain in her incision.   I will advance her to a full liquid diet.  I offered her Ultram orally for moderate pain and told her to wean off IV analgesics.  Continue ambulation and incentive spirometry.     VITALS:  98.3  14  66  89/66  99% RA     ABNL/PERTINENT LABS/RADIOLOGY/DIAGNOSTIC STUDIES:  NA        MD CONSULTS/ASSESSMENT AND PLAN:  Gen surgeon pn  Assessment & Plan  Status post partial small bowel resection with adjacent lymph node sampling  I will advance her to a full liquid diet.  I offered her Ultram orally for moderate pain and told her to wean off IV analgesics.  Continue ambulation and incentive spirometry.              MEDICATIONS:  Lovenox 40 mg sc daily  Protonix 40 mg iv daily  Dilaudid 0.5 mg iv q4 hrs prn x3  Dilaudid 0.5 mg iv q2 hrs prn x2  Dilaudid 1 mg iv q4 hrs prn x2  Ultram 50 mg po q6 hrs prn x3        ORDERS:  Abd binder  Full liquid diet  Incentive spirometry  Up as tolerated              5/10 csr   Last Updated by Vedia Coffer, RN on 11/23/2022 1206     Review Status Created By   In Isabel Caprice, RN       Review Type Associated Date   -- 11/23/2022      Criteria Review   DATE: 5/10 inpt day 1  TYPE OF BED: med surg        PERTINENT UPDATES:  Pathology report pending.   will start on a clear liquid diet today.   X4 doses of Dilaudid 0.5 mg iv q2 hrs prn   X4 doses of Dilaudid 1 mg iv q4 hrs prn      VITALS:  97.5 (36.4) 18 75 109/63   97% RA        ABNL/PERTINENT LABS/RADIOLOGY/DIAGNOSTIC STUDIES:  NA        MD CONSULTS/ASSESSMENT AND PLAN:  Clinical research associate pn  Impression/recommendation  Patient post  segmental small bowel resection and lymph node sampling of small bowel mesentery for multiple small nodules in the small bowel and large matted adenopathy in the root of her mesentery.  It surgery this was examined on the back table and these appear to be submucosal lesions.  Pathology report pending.     Patient appears to be starting to resolve her postoperative ileus and will start on a clear liquid diet today.        MEDICATIONS:  Lovenox 40 mg sc daily  Protonix 40 mg iv daily  Dilaudid 0.5 mg iv q2 hrs prn x4  Dilaudid 1 mg iv q4 hrs prn x4  ORDERS:  Incentive spirometry  Up as tolerated  Clear liquid diet

## 2022-11-25 LAB — CULTURE, TISSUE
Culture: NO GROWTH
Direct Exam: NONE SEEN
Direct Exam: NONE SEEN

## 2022-11-25 NOTE — Discharge Instructions (Signed)
Normal changes you may experience after a colonoscopy/EGD:  Passing of gas for several hours after  Some mild abdominal cramping  If a biopsy/ polypectomy was done, you may see some spotting of blood on the tissue when wiping  You may feel fatigued for the next 24-48 hours due to the preparation, sedation and procedure    Activity   You have had anesthesia today  Do not drive, operate heavy equipment, consume alcoholic beverages, or make any important decisions  for 24 hours   Take your time changing positions today. You may feel light headed or dizzy if you move too quickly.   Rest for the next 24 hours.   Diet   You can eat your normal diet when you feel well. You should start off with bland foods like chicken soup, toast, or yogurt. Then advance as tolerated.  Drink plenty of fluids (unless your doctor tells you not to). Your urine should be very lightly colored without a strong odor.    Medicines   Continue your home medications as ordered by your physician.     Call your doctor now or seek immediate medical care if:   Dr. Daboul [phone]  You are passing blood rectally or vomiting blood (color of blood may be red or black)  Severe abdominal pain or tenderness (that is not relieved by passing air)   You have a fever, chills or excessive sweating   You have persistent nausea or vomiting   Redness or swelling at the IV site

## 2022-11-25 NOTE — Care Coordination-Inpatient (Signed)
Care Transitions Outreach Attempt    Call within 2 business days of discharge: Yes   Attempted to reach patient for transitions of care follow up. Unable to reach patient.    Patient: Monica Wood Patient DOB: Nov 25, 1975 MRN: [id number]    Last Discharge Facility       Date Complaint Diagnosis Description Type Department Provider    11/16/22 Abdominal Pain Mesenteric mass ... ED to Hosp-Admission (Discharged) (ADMITTED) [facility] White, Beth A, MD; Ashraf-Moghal,...              Was this an external facility discharge? No Discharge Facility Name: [facility]    Noted following upcoming appointments from discharge chart review:   [facility] follow up appointment(s):   Future Appointments   Date Time Provider Department Center   01/28/2023 12:15 PM Levander Campion, APRN - CNP [facility]   03/09/2023  3:00 PM Daboul, Isam, MD [facility]     [facility]  follow up appointment(s): unknown

## 2022-11-25 NOTE — Telephone Encounter (Signed)
Writer called pt and left v/m asking pt to call back to schedule f/u appt.  Writer told pt to ask for [facility]

## 2022-11-25 NOTE — Telephone Encounter (Signed)
Pt returned call and unable to reach clinical staff. Please contact pt.

## 2022-11-25 NOTE — Telephone Encounter (Signed)
Pt called and stated she already had her EGD, so she needs to cancel the scheduled EGD. Pt is also requesting a follow up sooner then writer has available. Please contact pt.

## 2022-11-26 ENCOUNTER — Observation Stay
Admit: 2022-11-26 | Discharge: 2022-11-28 | Disposition: A | Discharge status: Home / Self Care | Payer: PRIVATE HEALTH INSURANCE | Attending: Surgery | Admitting: Surgery

## 2022-11-26 ENCOUNTER — Emergency Department: Admit: 2022-11-26 | Payer: PRIVATE HEALTH INSURANCE | Primary: Nurse Practitioner

## 2022-11-26 DIAGNOSIS — K567 Ileus, unspecified: Secondary | ICD-10-CM

## 2022-11-26 DIAGNOSIS — K56609 Unspecified intestinal obstruction, unspecified as to partial versus complete obstruction: Secondary | ICD-10-CM

## 2022-11-26 LAB — HEPATIC FUNCTION PANEL
ALT: 142 U/L — ABNORMAL HIGH (ref 5–33)
AST: 164 U/L — ABNORMAL HIGH (ref <32)
Albumin/Globulin Ratio: 1.1 (ref 1.0–2.5)
Albumin: 4.3 g/dL (ref 3.5–5.2)
Alkaline Phosphatase: 130 U/L — ABNORMAL HIGH (ref 35–104)
Bilirubin, Direct: 0.2 mg/dL (ref <0.3)
Bilirubin, Indirect: 0.3 mg/dL (ref 0.0–1.0)
Total Bilirubin: 0.5 mg/dL (ref 0.3–1.2)
Total Protein: 8.3 g/dL (ref 6.4–8.3)

## 2022-11-26 LAB — CBC WITH AUTO DIFFERENTIAL
Basophils %: 0 % (ref 0–2)
Basophils Absolute: 0 10*3/uL (ref 0.0–0.2)
Eosinophils %: 0 % — ABNORMAL LOW (ref 1–4)
Eosinophils Absolute: 0.1 10*3/uL (ref 0.0–0.4)
Hematocrit: 39.9 % (ref 36–46)
Hemoglobin: 12.5 g/dL (ref 12.0–16.0)
Lymphocytes %: 7 % — ABNORMAL LOW (ref 24–44)
Lymphocytes Absolute: 0.9 10*3/uL — ABNORMAL LOW (ref 1.0–4.8)
MCH: 22.6 pg — ABNORMAL LOW (ref 26–34)
MCHC: 31.4 g/dL (ref 31–37)
MCV: 71.9 fL — ABNORMAL LOW (ref 80–100)
MPV: 7.7 fL (ref 6.0–12.0)
Monocytes %: 6 % (ref 2–11)
Monocytes Absolute: 0.7 10*3/uL (ref 0.1–1.2)
Neutrophils %: 87 % — ABNORMAL HIGH (ref 36–66)
Neutrophils Absolute: 11.6 10*3/uL — ABNORMAL HIGH (ref 1.8–7.7)
Platelets: 509 10*3/uL — ABNORMAL HIGH (ref 140–450)
RBC: 5.55 m/uL — ABNORMAL HIGH (ref 4.0–5.2)
RDW: 13.8 % (ref 12.5–15.4)
WBC: 13.3 10*3/uL — ABNORMAL HIGH (ref 3.5–11.0)

## 2022-11-26 LAB — BASIC METABOLIC PANEL
Anion Gap: 13 mmol/L (ref 9–17)
BUN: 15 mg/dL (ref 6–20)
CO2: 27 mmol/L (ref 20–31)
Calcium: 11.4 mg/dL — ABNORMAL HIGH (ref 8.6–10.4)
Chloride: 100 mmol/L (ref 98–107)
Creatinine: 1 mg/dL — ABNORMAL HIGH (ref 0.5–0.9)
Est, Glom Filt Rate: 70 mL/min/{1.73_m2} (ref >60)
Glucose: 142 mg/dL — ABNORMAL HIGH (ref 70–99)
Potassium: 4.3 mmol/L (ref 3.7–5.3)
Sodium: 140 mmol/L (ref 135–144)

## 2022-11-26 LAB — SURGICAL PATHOLOGY REPORT

## 2022-11-26 LAB — LIPASE: Lipase: 13 U/L (ref 13–60)

## 2022-11-26 LAB — TROPONIN: Troponin, High Sensitivity: 7 ng/L (ref 0–14)

## 2022-11-26 MED ORDER — DEXTROSE-NACL 5-0.45 % IV SOLN
5-0.45 % | INTRAVENOUS | Status: DC
Start: 2022-11-26 — End: 2022-11-28
  Administered 2022-11-27 – 2022-11-28 (×5): via INTRAVENOUS

## 2022-11-26 MED ORDER — POTASSIUM BICARB-CITRIC ACID 20 MEQ PO TBEF
20 MEQ | ORAL | Status: DC | PRN
Start: 2022-11-26 — End: 2022-11-28

## 2022-11-26 MED ORDER — POTASSIUM CHLORIDE 10 MEQ/100ML IV SOLN
10100 MEQ/0ML | INTRAVENOUS | Status: DC | PRN
Start: 2022-11-26 — End: 2022-11-28

## 2022-11-26 MED ORDER — SODIUM CHLORIDE 0.9 % IV BOLUS
0.9 % | Freq: Once | INTRAVENOUS | Status: DC
Start: 2022-11-26 — End: 2022-11-28

## 2022-11-26 MED ORDER — ONDANSETRON HCL 4 MG/2ML IJ SOLN
42 MG/2ML | Freq: Four times a day (QID) | INTRAMUSCULAR | Status: DC | PRN
Start: 2022-11-26 — End: 2022-11-28
  Administered 2022-11-26 – 2022-11-27 (×2): 4 mg via INTRAVENOUS

## 2022-11-26 MED ORDER — NORMAL SALINE FLUSH 0.9 % IV SOLN
0.9 % | INTRAVENOUS | Status: DC | PRN
Start: 2022-11-26 — End: 2022-11-28

## 2022-11-26 MED ORDER — POTASSIUM CHLORIDE CRYS ER 20 MEQ PO TBCR
20 MEQ | ORAL | Status: DC | PRN
Start: 2022-11-26 — End: 2022-11-28
  Administered 2022-11-27: 16:00:00 40 meq via ORAL

## 2022-11-26 MED ORDER — ONDANSETRON HCL 4 MG/2ML IJ SOLN
4 | Freq: Once | INTRAMUSCULAR | Status: AC
Start: 2022-11-26 — End: 2022-11-26
  Administered 2022-11-26: 19:00:00 4 mg via INTRAVENOUS

## 2022-11-26 MED ORDER — NORMAL SALINE FLUSH 0.9 % IV SOLN
0.9 % | INTRAVENOUS | Status: DC | PRN
Start: 2022-11-26 — End: 2022-11-28
  Administered 2022-11-26: 20:00:00 10 mL via INTRAVENOUS

## 2022-11-26 MED ORDER — FAMOTIDINE (PF) 20 MG/2ML IV SOLN
202 MG/2ML | Freq: Two times a day (BID) | INTRAVENOUS | Status: DC
Start: 2022-11-26 — End: 2022-11-28
  Administered 2022-11-27 – 2022-11-28 (×4): 20 mg via INTRAVENOUS

## 2022-11-26 MED ORDER — TRAZODONE HCL 50 MG PO TABS
50 MG | Freq: Every evening | ORAL | Status: DC | PRN
Start: 2022-11-26 — End: 2022-11-28

## 2022-11-26 MED ORDER — IOPAMIDOL 76 % IV SOLN
76 | Freq: Once | INTRAVENOUS | Status: AC | PRN
Start: 2022-11-26 — End: 2022-11-26
  Administered 2022-11-26: 20:00:00 75 mL via INTRAVENOUS

## 2022-11-26 MED ORDER — SODIUM CHLORIDE 0.9 % IV BOLUS
0.9 | Freq: Once | INTRAVENOUS | Status: AC
Start: 2022-11-26 — End: 2022-11-26
  Administered 2022-11-26: 23:00:00 1000 mL via INTRAVENOUS

## 2022-11-26 MED ORDER — SODIUM CHLORIDE 0.9 % IV SOLN
0.9 % | INTRAVENOUS | Status: DC | PRN
Start: 2022-11-26 — End: 2022-11-28

## 2022-11-26 MED ORDER — NORMAL SALINE FLUSH 0.9 % IV SOLN
0.9 % | Freq: Two times a day (BID) | INTRAVENOUS | Status: DC
Start: 2022-11-26 — End: 2022-11-28
  Administered 2022-11-27 – 2022-11-28 (×3): 10 mL via INTRAVENOUS

## 2022-11-26 MED ORDER — SODIUM CHLORIDE 0.9 % IV BOLUS
0.9 | Freq: Once | INTRAVENOUS | Status: AC
Start: 2022-11-26 — End: 2022-11-26
  Administered 2022-11-26: 19:00:00 1000 mL via INTRAVENOUS

## 2022-11-26 MED ORDER — ENOXAPARIN SODIUM 40 MG/0.4ML IJ SOSY
400.4 MG/0.4ML | Freq: Every day | INTRAMUSCULAR | Status: DC
Start: 2022-11-26 — End: 2022-11-28
  Administered 2022-11-27 – 2022-11-28 (×2): 40 mg via SUBCUTANEOUS

## 2022-11-26 MED ORDER — ACETAMINOPHEN 325 MG PO TABS
325 MG | Freq: Four times a day (QID) | ORAL | Status: DC | PRN
Start: 2022-11-26 — End: 2022-11-28
  Administered 2022-11-27 – 2022-11-28 (×2): 650 mg via ORAL

## 2022-11-26 MED ORDER — ONDANSETRON 4 MG PO TBDP
4 MG | Freq: Three times a day (TID) | ORAL | Status: DC | PRN
Start: 2022-11-26 — End: 2022-11-28
  Administered 2022-11-28: 11:00:00 4 mg via ORAL

## 2022-11-26 MED ORDER — HYDROMORPHONE HCL 1 MG/ML IJ SOLN
1 MG/ML | INTRAMUSCULAR | Status: DC | PRN
Start: 2022-11-26 — End: 2022-11-28
  Administered 2022-11-27 – 2022-11-28 (×7): 0.5 mg via INTRAVENOUS

## 2022-11-26 MED ORDER — ALBUTEROL SULFATE HFA 108 (90 BASE) MCG/ACT IN AERS
108 | Freq: Four times a day (QID) | RESPIRATORY_TRACT | Status: DC | PRN
Start: 2022-11-26 — End: 2022-11-26

## 2022-11-26 MED ORDER — ACETAMINOPHEN 650 MG RE SUPP
650 MG | Freq: Four times a day (QID) | RECTAL | Status: DC | PRN
Start: 2022-11-26 — End: 2022-11-28

## 2022-11-26 MED ORDER — MAGNESIUM SULFATE 2000 MG/50 ML IVPB PREMIX
250 GM/50ML | INTRAVENOUS | Status: DC | PRN
Start: 2022-11-26 — End: 2022-11-28

## 2022-11-26 MED FILL — LISDEXAMFETAMINE DIMESYLATE 40 CAPS: 40 40 MG | 30 days supply | Qty: 30 | Fill #0 | Status: AC

## 2022-11-26 MED FILL — ONDANSETRON HCL 4 MG/2ML IJ SOLN: 4 MG/2ML | INTRAMUSCULAR | Qty: 2

## 2022-11-26 NOTE — Care Coordination-Inpatient (Signed)
Care Transitions Outreach Attempt    Call within 2 business days of discharge: Yes   Attempted to reach patient for transitions of care follow up. Unable to reach patient. Unable To Reach letter sent via mychart.    Patient: Monica Wood Patient DOB: 29-Apr-1976 MRN: [id number]    Last Discharge Facility       Date Complaint Diagnosis Description Type Department Provider    11/16/22 Abdominal Pain Mesenteric mass ... ED to Hosp-Admission (Discharged) (ADMITTED) [facility] White, Beth A, MD; [facility]raf-Moghal,...              Was this an external facility discharge? No Discharge Facility Name: [facility]    Noted following upcoming appointments from discharge chart review:   [facility] follow up appointment(s):   Future Appointments   Date Time Provider Department Center   01/28/2023 12:15 PM Levander Campion, APRN - CNP [facility]   03/09/2023  3:00 PM Daboul, Isam, MD [facility]     Non-[facility]  follow up appointment(s): uknown

## 2022-11-26 NOTE — Other (Signed)
RT Inhaler-Nebulizer Bronchodilator Protocol Note    There is a bronchodilator order in the chart from a provider indicating to follow the RT Bronchodilator Protocol and there is an "Initiate RT Inhaler-Nebulizer Bronchodilator Protocol" order as well (see protocol at bottom of note).    CXR Findings:  No results found.    The findings from the last RT Protocol Assessment were as follows:   History Pulmonary Disease: None or smoker <15 pack years  Respiratory Pattern: Regular pattern and RR 12-20 bpm  Breath Sounds: Slightly diminished and/or crackles  Cough: Strong, spontaneous, non-productive  Indication for Bronchodilator Therapy:    Bronchodilator Assessment Score: 2    Aerosolized bronchodilator medication orders have been revised according to the RT Inhaler-Nebulizer Bronchodilator Protocol below.    Respiratory Therapist to perform RT Therapy Protocol Assessment initially then follow the protocol.  Repeat RT Therapy Protocol Assessment PRN for score 0-3 or on second treatment, BID, and PRN for scores above 3.    No Indications - adjust the frequency to every 6 hours PRN wheezing or bronchospasm, if no treatments needed after 48 hours then discontinue using Per Protocol order mode.     If indication present, adjust the RT bronchodilator orders based on the Bronchodilator Assessment Score as indicated below.  Use Inhaler orders unless patient has one or more of the following: on home nebulizer, not able to hold breath for 10 seconds, is not alert and oriented, cannot activate and use MDI correctly, or respiratory rate 25 breaths per minute or more, then use the equivalent nebulizer order(s) with same Frequency and PRN reasons based on the score.  If a patient is on this medication at home then do not decrease Frequency below that used at home.    0-3 - enter or revise RT bronchodilator order(s) to equivalent RT Bronchodilator order with Frequency of every 4 hours PRN for wheezing or increased work of breathing  using Per Protocol order mode.        4-6 - enter or revise RT Bronchodilator order(s) to two equivalent RT bronchodilator orders with one order with BID Frequency and one order with Frequency of every 4 hours PRN wheezing or increased work of breathing using Per Protocol order mode.        7-10 - enter or revise RT Bronchodilator order(s) to two equivalent RT bronchodilator orders with one order with TID Frequency and one order with Frequency of every 4 hours PRN wheezing or increased work of breathing using Per Protocol order mode.       11-13 - enter or revise RT Bronchodilator order(s) to one equivalent RT bronchodilator order with QID Frequency and an Albuterol order with Frequency of every 4 hours PRN wheezing or increased work of breathing using Per Protocol order mode.      Greater than 13 - enter or revise RT Bronchodilator order(s) to one equivalent RT bronchodilator order with every 4 hours Frequency and an Albuterol order with Frequency of every 2 hours PRN wheezing or increased work of breathing using Per Protocol order mode.     RT to enter RT Home Evaluation for COPD & MDI Assessment order using Per Protocol order mode.    Electronically signed by Matthias Hughs, RCP on 11/26/2022 at 8:47 PM

## 2022-11-26 NOTE — ED Provider Notes (Signed)
[place] [facility] EMERGENCY DEPARTMENT  EMERGENCY DEPARTMENT ENCOUNTER      Pt Name: Monica Wood  MRN: [id number]  Birthdate 06-05-1976  Date of evaluation: 11/26/2022  Provider: Rolly Pancake, APRN - CNP  6:15 PM    CHIEF COMPLAINT       Chief Complaint   Patient presents with    Emesis    Abdominal Pain         HISTORY OF PRESENT ILLNESS    Monica Wood is a 47 y.o. female who presents to the emergency department      This is a nontoxic but genuinely ill-appearing 47 year old female presenting to the emergency department at recommendation of her general surgeon Dr. Cliffton Asters on 11/16/2022 the patient was admitted to the hospital, during her course of stay she had upper lower endoscopies, the patient also had exploratory laparotomy with small bowel resection and removal of adjacent enlarged mesenteric lymph nodes the patient reports that she had been doing well at home she does report that she has had a formed bowel movement today no melena or hematochezia, she has had increasing upper abdominal pain with nausea vomiting estimates between 10-20 episodes no hematemesis she has had no fevers, no shortness of breath, has generalized weakness; they were concerned for ileus/dehydration prompting the referral to the emergency department.  Patient denies any urinary symptoms.  LMP: Ablation.    The history is provided by the patient and medical records.       Nursing Notes were reviewed.    REVIEW OF SYSTEMS       Review of Systems   Constitutional:  Positive for fatigue. Negative for chills and fever.   HENT:  Negative for congestion and sore throat.    Respiratory:  Negative for cough and shortness of breath.    Cardiovascular:  Negative for chest pain and leg swelling.   Gastrointestinal:  Positive for abdominal pain, nausea and vomiting. Negative for blood in stool, constipation and diarrhea.   Genitourinary:  Negative for dysuria and hematuria.   Musculoskeletal:  Negative for back pain and neck pain.   Skin:   Negative for rash and wound.   Neurological:  Negative for numbness and headaches.   Psychiatric/Behavioral:  Negative for confusion and suicidal ideas.        Except as noted above the remainder of the review of systems was reviewed and negative.       PAST MEDICAL HISTORY     Past Medical History:   Diagnosis Date    Bursitis     Fibromyalgia     Scoliosis     Tendinitis          SURGICAL HISTORY       Past Surgical History:   Procedure Laterality Date    ABDOMINOPLASTY      COLONOSCOPY N/A 11/18/2022    COLONOSCOPY POLYPECTOMY SNARE BIOPSY performed by Marcia Brash, MD at [facility] OR    ESOPHAGOGASTRIC FUNDOPLICATION  2000    GALLBLADDER SURGERY      LAPAROTOMY N/A 11/19/2022    LAPAROTOMY EXPLORATORY.  SMALL BOWEL RESECTION.  INCISIONAL BIOPSY OF MESENTERIC LYMPH NODE performed by Lucious Groves, MD at [facility] OR    TUBAL LIGATION      UPPER GASTROINTESTINAL ENDOSCOPY N/A 11/18/2022    ESOPHAGOGASTRODUODENOSCOPY BIOPSY performed by Marcia Brash, MD at [facility] OR         CURRENT MEDICATIONS       Previous Medications    ALBUTEROL SULFATE HFA (  PROVENTIL HFA) 108 (90 BASE) MCG/ACT INHALER    Inhale 2 puffs into the lungs every 6 hours as needed for Wheezing or Shortness of Breath    BUPROPION (WELLBUTRIN XL) 150 MG EXTENDED RELEASE TABLET    Take 1 tablet by mouth every morning    DULOXETINE (CYMBALTA) 30 MG EXTENDED RELEASE CAPSULE    Take 1 capsule by mouth daily    DULOXETINE (CYMBALTA) 60 MG EXTENDED RELEASE CAPSULE    take 1 capsule by mouth once daily    FAMOTIDINE (PEPCID) 40 MG TABLET    Take 1 tablet by mouth every evening    TIZANIDINE (ZANAFLEX) 2 MG TABLET    Take 1 tablet by mouth daily as needed (back muscle)    TRAMADOL (ULTRAM) 50 MG TABLET    Take 1 tablet by mouth every 6 hours as needed for Pain for up to 5 days. Intended supply: 3 days. Take lowest dose possible to manage pain Max Daily Amount: 200 mg    TRAZODONE HCL PO    Take by mouth       ALLERGIES     Oxycodone, Parsley  leaves, Percocet [oxycodone-acetaminophen], Codeine, and Morphine    FAMILY HISTORY       Family History   Problem Relation Age of Onset    Diabetes Mother     Diabetes Maternal Grandmother           SOCIAL HISTORY       Social History     Socioeconomic History    Marital status: Married   Tobacco Use    Smoking status: Never    Smokeless tobacco: Never   Substance and Sexual Activity    Alcohol use: Not Currently     Comment: 3/4 times a week    Drug use: Never     Social Determinants of Health     Financial Resource Strain: Low Risk  (02/19/2022)    Overall Financial Resource Strain (CARDIA)     Difficulty of Paying Living Expenses: Not hard at all   Food Insecurity: No Food Insecurity (11/16/2022)    Hunger Vital Sign     Worried About Running Out of Food in the Last Year: Never true     Ran Out of Food in the Last Year: Never true   Transportation Needs: No Transportation Needs (11/16/2022)    PRAPARE - Therapist, art (Medical): No     Lack of Transportation (Non-Medical): No   Physical Activity: Insufficiently Active (02/16/2022)    Exercise Vital Sign     Days of Exercise per Week: 4 days     Minutes of Exercise per Session: 30 min   Intimate Partner Violence: Not At Risk (02/16/2022)    Humiliation, Afraid, Rape, and Kick questionnaire     Fear of Current or Ex-Partner: No     Emotionally Abused: No     Physically Abused: No     Sexually Abused: No   Housing Stability: Low Risk  (11/16/2022)    Housing Stability Vital Sign     Unable to Pay for Housing in the Last Year: No     Number of Places Lived in the Last Year: 1     Unstable Housing in the Last Year: No       SCREENINGS         Glasgow Coma Scale  Eye Opening: Spontaneous  Best Verbal Response: Oriented  Best Motor Response: Obeys commands  Glasgow Coma Scale  Score: 15                     CIWA Assessment  BP: 121/84  Pulse: 82                 PHYSICAL EXAM       ED Triage Vitals   BP Temp Temp src Pulse Resp SpO2 Height Weight   -- -- --  -- -- -- -- --       Physical Exam  Vitals and nursing note reviewed.   Constitutional:       General: She is not in acute distress.     Appearance: Normal appearance. She is ill-appearing. She is not toxic-appearing.   HENT:      Head: Normocephalic and atraumatic.      Right Ear: External ear normal.      Left Ear: External ear normal.      Nose: Nose normal.      Mouth/Throat:      Mouth: Mucous membranes are moist.   Eyes:      General:         Right eye: No discharge.         Left eye: No discharge.      Conjunctiva/sclera: Conjunctivae normal.   Cardiovascular:      Rate and Rhythm: Normal rate and regular rhythm.      Pulses: Normal pulses.      Comments: Bilateral pedal pulses 2+ and equal.  Pulmonary:      Effort: Pulmonary effort is normal.      Breath sounds: Normal breath sounds.      Comments: Anterior posterior lung sounds are clear.  Abdominal:      General: Bowel sounds are normal. There is no distension.      Palpations: Abdomen is soft. There is no mass.      Tenderness: There is abdominal tenderness. There is no guarding.      Hernia: No hernia is present.      Comments: Max tenderness in the epigastric region, patient has ventral midline vertical surgical incision edges well-approximated staples intact no drainage or surrounding erythema abdomen is otherwise soft nontender.  No peritoneal signs guarding or rigidity.  No distention.   Musculoskeletal:         General: No deformity or signs of injury.      Cervical back: Normal range of motion and neck supple.      Right lower leg: No edema.      Left lower leg: No edema.   Skin:     General: Skin is warm and dry.      Capillary Refill: Capillary refill takes less than 2 seconds.   Neurological:      General: No focal deficit present.      Mental Status: She is alert and oriented to person, place, and time. Mental status is at baseline.      Gait: Gait normal.   Psychiatric:         Mood and Affect: Mood normal.         Behavior: Behavior normal.          DIAGNOSTIC RESULTS     EKG: All EKG's are interpreted by the Emergency Department Physician who either signs or Co-signs this chart in the absence of a cardiologist.        RADIOLOGY:   Non-plain film images such as CT, Ultrasound and MRI are read by the radiologist. Plain radiographic images  are visualized and preliminarily interpreted by the emergency physician with the below findings:        Interpretation per the Radiologist below, if available at the time of this note:    XR CHEST (2 VW)   Final Result   No acute process.         CT ABDOMEN PELVIS W IV CONTRAST Additional Contrast? None   Final Result   1. Findings suggestive of a distal partial small bowel obstruction.   2. Persistent but stable appearing mesenteric mass.   3. Mild ascites.               ED BEDSIDE ULTRASOUND:   Performed by ED Physician - none    LABS:  Labs Reviewed   CBC WITH AUTO DIFFERENTIAL - Abnormal; Notable for the following components:       Result Value    WBC 13.3 (*)     RBC 5.55 (*)     MCV 71.9 (*)     MCH 22.6 (*)     Platelets 509 (*)     Neutrophils % 87 (*)     Lymphocytes % 7 (*)     Eosinophils % 0 (*)     Neutrophils Absolute 11.60 (*)     Lymphocytes Absolute 0.90 (*)     All other components within normal limits   BASIC METABOLIC PANEL - Abnormal; Notable for the following components:    Glucose 142 (*)     Creatinine 1.0 (*)     Calcium 11.4 (*)     All other components within normal limits   HEPATIC FUNCTION PANEL - Abnormal; Notable for the following components:    Alkaline Phosphatase 130 (*)     ALT 142 (*)     AST 164 (*)     All other components within normal limits   LIPASE   TROPONIN   URINALYSIS   BASIC METABOLIC PANEL W/ REFLEX TO MG FOR LOW K   CBC WITH AUTO DIFFERENTIAL   CHROMOGRANIN A       All other labs were within normal range or not returned as of this dictation.    EMERGENCY DEPARTMENT COURSE and DIFFERENTIAL DIAGNOSIS/MDM:   Vitals:    Vitals:    11/26/22 1446   BP: 121/84   Pulse: 82    Resp: 18   Temp: 97.7 F (36.5 C)   TempSrc: Oral   SpO2: 94%   Weight: 93.4 kg (206 lb)   Height: 1.626 m (5\' 4" )           Medical Decision Making  This is a nontoxic but genuinely ill-appearing 47 year old female presenting to the emergency department at recommendation of her general surgeon Dr. Cliffton Asters on 11/16/2022 the patient was admitted to the hospital, during her course of stay she had upper lower endoscopies, the patient also had exploratory laparotomy with small bowel resection and removal of adjacent enlarged mesenteric lymph nodes the patient reports that she had been doing well at home she does report that she has had a formed bowel movement today no melena or hematochezia, she has had increasing upper abdominal pain with nausea vomiting estimates between 10-20 episodes no hematemesis she has had no fevers, no shortness of breath, has generalized weakness; they were concerned for ileus/dehydration prompting the referral to the emergency department.  Patient denies any urinary symptoms.  LMP: Ablation.    Screening lab work was obtained.  EKG ordered due to the epigastric pain to rule out  acute coronary syndrome.  CT abdomen pelvis with IV contrast was ordered.    Twelve-lead EKG reviewed per the attending physician please refer to their note for further details.    A liter of normal saline 4 mg IV push Zofran was ordered.    Lab work returned CBC shows mild leukocytosis white blood cell count is 13.3 normal hemoglobin normal hematocrit, mildly elevated platelets at 509, BMP negative for acute abnormalities, normal lipase.  Elevation of the hepatic enzymes, alkaline phos 130 ALT 142 AST 164.  High-sensitivity troponin returns normal at 7.    CT ABDOMEN PELVIS W IV CONTRAST Additional Contrast? None    Result Date: 11/26/2022  EXAMINATION: CT OF THE ABDOMEN AND PELVIS WITH CONTRAST 11/26/2022 4:05 pm TECHNIQUE: CT of the abdomen and pelvis was performed with the administration of intravenous contrast.  Multiplanar reformatted images are provided for review. Automated exposure control, iterative reconstruction, and/or weight based adjustment of the mA/kV was utilized to reduce the radiation dose to as low as reasonably achievable. COMPARISON: 11/16/2022 HISTORY: ORDERING SYSTEM PROVIDED HISTORY: upper abd pain, nausea/vomiting; status post exploratory laparotomy with adhesion/mass removal on 11/20/2022 TECHNOLOGIST PROVIDED HISTORY: upper abd pain, nausea/vomiting; status post exploratory laparotomy with adhesion/mass removal on 11/20/2022 Decision Support Exception - unselect if not a suspected or confirmed emergency medical condition->Emergency Medical Condition (MA) Reason for Exam: emesis, abdominal pain, post abdominal sx FINDINGS: Lower Chest: Lung bases are clear. Liver: Homogeneous attenuation without focal mass or intrahepatic bile duct dilatation Spleen: Unremarkable Adrenal glands: Unremarkable. Pancreas: Homogenous enhancement without focal mass or ductal dilatation Gallbladder/Biliary tree: Gallbladder is surgically absent.  No bile duct dilatation identified. Kidneys: No suspicious mass, calculus or hydronephrosis. Symmetric enhancement is noted.  Stable subcentimeter low-density left renal lesions consistent with cysts. Aorta: Normal caliber. Normal enhancement. Peritoneum/Retroperitoneum: The mesenteric mass measures 3.8 cm max, unchanged from the prior exam.  There is trace perihepatic ascites. Appendix: Not visualized GI/Bowel: There are fluid-filled dilated small bowel loops present.  There are decompressed loops in the right lower quadrant suggesting partial distal small-bowel obstruction.  There are surgical clips associated with small bowel loops in the low abdomen.  Fluid is noted in the right hemicolon. Pelvis: Urinary bladder is unremarkable in appearance. No pelvic adenopathy or mass identified. The uterus is grossly unremarkable.  Pelvic ascites is noted. Bones/Soft Tissues: There is soft  tissue changes with edema and subcutaneous gas in the anterior abdominal wall as well as midline ventral skin staples consistent with recent abdominal surgery.     1. Findings suggestive of a distal partial small bowel obstruction. 2. Persistent but stable appearing mesenteric mass. 3. Mild ascites.     XR CHEST (2 VW)    Result Date: 11/26/2022  EXAMINATION: TWO XRAY VIEWS OF THE CHEST 11/26/2022 4:11 pm COMPARISON: 01/20/2021 HISTORY: ORDERING SYSTEM PROVIDED HISTORY: Chest Pain TECHNOLOGIST PROVIDED HISTORY: Chest Pain Reason for Exam: chest pain FINDINGS: The lungs are without acute focal process.  There is no effusion or pneumothorax. The cardiomediastinal silhouette is stable. The osseous structures are stable.     No acute process.     Spoke with the patient's general surgeon Dr. Cliffton Asters who also was reviewing the patient's lab work and reviewing the patient's CT abdomen and pelvis, updated on patient's symptoms, treatment modalities in the emergency department, is agreeable for admission.    The patient and her husband were updated and all the diagnostic test findings, additional liter of normal saline was ordered, the patient has had improvement of  the vomiting since Zofran in the department, the patient is resting comfortably in no acute distress at this time awaiting disposition to the floor.    Amount and/or Complexity of Data Reviewed  Labs: ordered.  Radiology: ordered.  ECG/medicine tests: ordered.    Risk  Prescription drug management.  Decision regarding hospitalization.            REASSESSMENT          CRITICAL CARE TIME   Total Critical Care time was  minutes, excluding separately reportable procedures.  There was a high probability of clinically significant/life threatening deterioration in the patient's condition which required my urgent intervention.      CONSULTS:  None    PROCEDURES:  Unless otherwise noted below, none     Procedures        FINAL IMPRESSION      1. Small bowel obstruction (HCC)     2. Nausea and vomiting, unspecified vomiting type    3. Pain of upper abdomen    4. Dehydration          DISPOSITION/PLAN   DISPOSITION Admitted 11/26/2022 06:00:46 PM      PATIENT REFERRED TO:  No follow-up provider specified.    DISCHARGE MEDICATIONS:  New Prescriptions    No medications on file     Controlled Substances Monitoring:          No data to display                (Please note that portions of this note were completed with a voice recognition program.  Efforts were made to edit the dictations but occasionally words are mis-transcribed.)    Rolly Pancake, APRN - CNP (electronically signed)  Attending Emergency Physician           Rolly Pancake, APRN - CNP  11/26/22 1815

## 2022-11-26 NOTE — ED Provider Notes (Incomplete)
[facility] Emergency Department  [phone]  [facility] [address]  Phone: [phone]  Fax: [phone]      Attending Physician Attestation          CHIEF COMPLAINT       Chief Complaint   Patient presents with    Emesis    Abdominal Pain       DIAGNOSTIC RESULTS     LABS:  Labs Reviewed   CBC WITH AUTO DIFFERENTIAL   BASIC METABOLIC PANEL   LIPASE   HEPATIC FUNCTION PANEL   URINALYSIS       All other labs were within normal range or not returned as of this dictation.    RADIOLOGY:  No orders to display         EMERGENCY DEPARTMENT COURSE:   Vitals:    Vitals:    11/26/22 1446   BP: 121/84   Pulse: 82   Resp: 18   Temp: 97.7 F (36.5 C)   TempSrc: Oral   SpO2: 94%   Weight: 93.4 kg (206 lb)   Height: 1.626 m (5\' 4" )     -------------------------  BP: 121/84, Temp: 97.7 F (36.5 C), Pulse: 82, Respirations: 18             PERTINENT ATTENDING PHYSICIAN COMMENTS:    I performed a history and physical examination of the patient and discussed management with the mid level provider. I reviewed the mid level provider's note and agree with the documented findings and plan of care. Any areas of disagreement are noted on the chart. I was personally present for the key portions of any procedures. I have documented in the chart those procedures where I was not present during the key portions. I have reviewed the emergency nurses triage note. I agree with the chief complaint, past medical history, past surgical history, allergies, medications, social and family history as documented unless otherwise noted below. Documentation of the HPI, Physical Exam and Medical Decision Making performed by mid level providers is based on my personal performance of the HPI, PE and MDM. For Residents, Physician Assistant/ Nurse Practitioner cases/documentation I have personally evaluated this patient and have completed at least one if not all key elements of the E/M (history, physical exam, and MDM).  Additional findings are as noted.    []        (Please note that portions of this note were completed with a voice recognition program.  Efforts were made to edit the dictations but occasionally words are mis-transcribed.)    Lorelle Gibbs, DO  Attending Emergency Medicine Physician

## 2022-11-26 NOTE — Progress Notes (Signed)
RT in room to instruct patient on Incentive Spirometer. RT advised patient of goal of 1850 and marker set. Patient's recent abdomindal surgery has prevented her from deep breathing. RT also advised patient of how to splint abdomen when coughing.

## 2022-11-27 LAB — EKG 12-LEAD
Atrial Rate: 67 {beats}/min
P Axis: 69 degrees
P-R Interval: 156 ms
Q-T Interval: 434 ms
QRS Duration: 90 ms
QTc Calculation (Bazett): 458 ms
R Axis: 47 degrees
T Axis: 48 degrees
Ventricular Rate: 67 {beats}/min

## 2022-11-27 LAB — MAGNESIUM: Magnesium: 2.1 mg/dL (ref 1.6–2.6)

## 2022-11-27 LAB — CBC WITH AUTO DIFFERENTIAL
Basophils %: 1 % (ref 0–2)
Basophils Absolute: 0.1 10*3/uL (ref 0.0–0.2)
Eosinophils %: 2 % (ref 1–4)
Eosinophils Absolute: 0.2 10*3/uL (ref 0.0–0.4)
Hematocrit: 31.3 % — ABNORMAL LOW (ref 36–46)
Hemoglobin: 9.8 g/dL — ABNORMAL LOW (ref 12.0–16.0)
Lymphocytes %: 31 % (ref 24–44)
Lymphocytes Absolute: 2.1 10*3/uL (ref 1.0–4.8)
MCH: 22.4 pg — ABNORMAL LOW (ref 26–34)
MCHC: 31.3 g/dL (ref 31–37)
MCV: 71.6 fL — ABNORMAL LOW (ref 80–100)
MPV: 7.5 fL (ref 6.0–12.0)
Monocytes %: 18 % — ABNORMAL HIGH (ref 2–11)
Monocytes Absolute: 1.2 10*3/uL (ref 0.1–1.2)
Neutrophils %: 48 % (ref 36–66)
Neutrophils Absolute: 3.3 10*3/uL (ref 1.8–7.7)
Platelets: 354 10*3/uL (ref 140–450)
RBC: 4.38 m/uL (ref 4.0–5.2)
RDW: 13.7 % (ref 12.5–15.4)
WBC: 6.8 10*3/uL (ref 3.5–11.0)

## 2022-11-27 LAB — BASIC METABOLIC PANEL W/ REFLEX TO MG FOR LOW K
Anion Gap: 7 mmol/L — ABNORMAL LOW (ref 9–17)
BUN: 11 mg/dL (ref 6–20)
CO2: 26 mmol/L (ref 20–31)
Calcium: 9.2 mg/dL (ref 8.6–10.4)
Chloride: 107 mmol/L (ref 98–107)
Creatinine: 0.8 mg/dL (ref 0.5–0.9)
Est, Glom Filt Rate: >90 mL/min/{1.73_m2} (ref >60)
Glucose: 120 mg/dL — ABNORMAL HIGH (ref 70–99)
Potassium: 3.5 mmol/L — ABNORMAL LOW (ref 3.7–5.3)
Sodium: 140 mmol/L (ref 135–144)

## 2022-11-27 LAB — FLOW CYTOMETRY LEUKEMIA/LYMPHOMA NODES OR FLUIDS

## 2022-11-27 LAB — IRON AND TIBC
Iron % Saturation: 32 % (ref 20–55)
Iron: 79 ug/dL (ref 37–145)
TIBC: 244 ug/dL — ABNORMAL LOW (ref 250–450)
UIBC: 165 ug/dL (ref 112–347)

## 2022-11-27 LAB — RETICULOCYTES
Absolute Retic #: 0.05 M/uL (ref 0.030–0.080)
Immature Retic Fract: 13.5 % (ref 2.7–18.3)
Retic %: 1.3 % (ref 0.5–1.9)
Retic Hemoglobin: 24.5 pg — ABNORMAL LOW (ref 28.2–35.7)

## 2022-11-27 LAB — LACTATE DEHYDROGENASE: LD: 174 U/L (ref 135–214)

## 2022-11-27 LAB — FERRITIN: Ferritin: 185 ng/mL — ABNORMAL HIGH (ref 13–150)

## 2022-11-27 LAB — HAPTOGLOBIN: Haptoglobin: 216 mg/dL — ABNORMAL HIGH (ref 30–200)

## 2022-11-27 MED ORDER — ALBUTEROL SULFATE HFA 108 (90 BASE) MCG/ACT IN AERS
10890 (90 Base) MCG/ACT | RESPIRATORY_TRACT | Status: DC | PRN
Start: 2022-11-27 — End: 2022-11-28

## 2022-11-27 MED FILL — ONDANSETRON HCL 4 MG/2ML IJ SOLN: 4 MG/2ML | INTRAMUSCULAR | Qty: 2

## 2022-11-27 MED FILL — FAMOTIDINE (PF) 20 MG/2ML IV SOLN: 20 MG/2ML | INTRAVENOUS | Qty: 2

## 2022-11-27 MED FILL — DILAUDID 1 MG/ML IJ SOLN: 1 MG/ML | INTRAMUSCULAR | Qty: 0.5

## 2022-11-27 MED FILL — ACETAMINOPHEN 325 MG PO TABS: 325 MG | ORAL | Qty: 2

## 2022-11-27 MED FILL — POTASSIUM CHLORIDE CRYS ER 20 MEQ PO TBCR: 20 MEQ | ORAL | Qty: 2

## 2022-11-27 MED FILL — ENOXAPARIN SODIUM 40 MG/0.4ML IJ SOSY: 40 MG/0.4ML | INTRAMUSCULAR | Qty: 0.4

## 2022-11-27 NOTE — Progress Notes (Signed)
[facility] - [facility]  PROGRESS NOTE    Shift date: 11/27/2022   Shift day: Thursday   Shift # 1    Room # 330/330-01   Name: Monica Wood                Religion:    Place of worship:     Referral: Routine Visit    Admit Date & Time: 11/26/2022  2:45 PM    Assessment:  Monica Wood is a 47 y.o. female in the hospital because the patient had to have a surgery. Upon entering the room writer observes that the patient was calm and resting. She informed me that she has her spouse and children in the support system. She does not attend any religious communities. She was happy to tell the chaplain that her son will be graduating from high school soon. She warmly welcomed me and talked to me.  She said she was feeling fine after the surgery.       Intervention:  Clinical research associate introduced self and title as Location manager offered space for the patient  to express feelings, needs, and concerns and provided a ministry presence. The writer spoke with the patient asking about her support system once she goes home. Her spouse and children will be helping her. The patient asked me to pray for her, which I did.      Outcome:  She thanked me for the prayer.     Plan:  Chaplains will remain available to offer spiritual and emotional support as needed.      Electronically signed by Arita Miss, on 11/27/2022 at 1:26 PM.  Spiritual Health  Satira Sark [facility]  [phone]

## 2022-11-27 NOTE — Plan of Care (Signed)
Problem: Discharge Planning  Goal: Discharge to home or other facility with appropriate resources  11/27/2022 0904 by Lisbeth Ply, RN  Outcome: Progressing  Flowsheets (Taken 11/27/2022 0730)  Discharge to home or other facility with appropriate resources: Identify barriers to discharge with patient and caregiver  11/27/2022 0245 by Sherre Lain, RN  Outcome: Progressing  Flowsheets (Taken 11/27/2022 0245)  Discharge to home or other facility with appropriate resources:   Identify barriers to discharge with patient and caregiver   Arrange for needed discharge resources and transportation as appropriate   Identify discharge learning needs (meds, wound care, etc)     Problem: Pain  Goal: Verbalizes/displays adequate comfort level or baseline comfort level  11/27/2022 0904 by Lisbeth Ply, RN  Outcome: Progressing  Flowsheets (Taken 11/27/2022 0758)  Verbalizes/displays adequate comfort level or baseline comfort level: Encourage patient to monitor pain and request assistance  11/27/2022 0245 by Sherre Lain, RN  Outcome: Progressing  Flowsheets (Taken 11/27/2022 0245)  Verbalizes/displays adequate comfort level or baseline comfort level:   Encourage patient to monitor pain and request assistance   Assess pain using appropriate pain scale   Administer analgesics based on type and severity of pain and evaluate response     Problem: ABCDS Injury Assessment  Goal: Absence of physical injury  11/27/2022 0904 by Lisbeth Ply, RN  Outcome: Progressing  11/27/2022 0245 by Sherre Lain, RN  Outcome: Progressing  Flowsheets (Taken 11/27/2022 0245)  Absence of Physical Injury: Implement safety measures based on patient assessment

## 2022-11-27 NOTE — H&P (Signed)
History and physical    Chief complaint nausea and vomiting    Patient is a 47 year old female who was just discharged about a couple of days ago after having presented with right upper quadrant pain and found by EGD to have gastritis and by CT scan found to have a mass in her small bowel mesentery.  This was thought not to be amenable to CT-guided core needle biopsy and the patient therefore underwent exploratory laparotomy.  She was found to have multiple small lesions in segment of small bowel over about 1 foot as well as some matted lymph nodes going down deep to the root of her small bowel mesentery.  She underwent small bowel resection with anastomosis and incisional but not excisional biopsy of these lymph nodes.  Patient had good GI function recovery after surgery and was discharged home on regular diet.    Patient states since then she has been having difficulties with some nausea and vomiting and was not able to keep anything down for the last day or so.  She is feeling fairly dehydrated.  For this reason she called her office and asked patient be seen in the ER.  She was admitted secondary to dehydration.  Of note the patient's potassium level is also little bit low at that time.    Patient states this morning she is feeling considerably better.  She is no longer having dry heaves and she has not required any nausea medicine since about the last 6 hours.  She is passing gas.  She has not had a bowel movement for about a day or so.    Past medical history significant for fibromyalgia  Back pain  Bursitis  History depression  Shoulder pain left side and right side  Restless leg syndrome  History of esophageal dysphagia  History of anemia  Scoliosis    Previous abdominal surgeries includes esophageal fundoplication  Cholecystectomy  Colonoscopy  Abdominoplasty  Tubal ligation  Laparotomy on the eighth of this month.    Family history consistent with diabetes    Social history  Patient does not smoke has  occasional alcohol usage    Allergies are to  Codeine and oxycodone  Partially family  Morphine      On physical examination patient sounds faintly coarse.  She thinks this is from her emesis  Lung excursion is good  Abdomen minimally if at all distended and soft.  She has some mild abdominal diffuse discomfort mild incisional discomfort but no peritoneal signs  No signs of any wound infection    Labs  BMP normal except for slightly low potassium at 3.5.  Creatinine unremarkable  Sugar minimally elevated at 120  Note mild elevation of LFTs yesterday.  Aloni Chuang count normal patient mildly anemic this may be secondary to postoperative status and fluid    X-rays  Patient had CT scan of abdomen and pelvis yesterday in the emergency room.  This shows no evidence of any metastatic disease in her liver.  She still has the mesenteric mass in the small bowel as this only was partially resected.  Radiology questions whether the patient had a partial small bowel obstruction I would question whether the patient would have an ileus postoperatively.    Impression/recommendation  Patient feeling somewhat better this morning and hydration has been improved  Will correct potassium with sliding scale  Recheck labs tomorrow  Discussed with patient whether this is a partial small bowel bowel obstruction or ileus secondary to her anastomosis.  Will  see how she does with liquids and then hopefully full liquids later today but hopefully this will resolve with just simply tincture of time.    Discussed with patient her pathology report as this had not been returned when she was discharged a couple of days ago.  Will ask oncology to see patient while she is here at the hospital as well but hopefully can be treated with Sandostatin and I believe there are some new antibody therapies out for this as well.

## 2022-11-27 NOTE — Plan of Care (Signed)
Problem: Discharge Planning  Goal: Discharge to home or other facility with appropriate resources  Outcome: Progressing  Flowsheets (Taken 11/27/2022 0245)  Discharge to home or other facility with appropriate resources:   Identify barriers to discharge with patient and caregiver   Arrange for needed discharge resources and transportation as appropriate   Identify discharge learning needs (meds, wound care, etc)     Problem: Pain  Goal: Verbalizes/displays adequate comfort level or baseline comfort level  Outcome: Progressing  Flowsheets (Taken 11/27/2022 0245)  Verbalizes/displays adequate comfort level or baseline comfort level:   Encourage patient to monitor pain and request assistance   Assess pain using appropriate pain scale   Administer analgesics based on type and severity of pain and evaluate response     Problem: ABCDS Injury Assessment  Goal: Absence of physical injury  Outcome: Progressing  Flowsheets (Taken 11/27/2022 0245)  Absence of Physical Injury: Implement safety measures based on patient assessment

## 2022-11-27 NOTE — Consults (Cosign Needed)
[facility] Hematology and Oncology - Consult Note  11/27/2022, 10:18 AM    Admit Date: 11/26/2022  Referring Physician: Lucious Groves, MD   PCP: Levander Campion, APRN - CNP    Reason for Consult: Neuroendocrine tumor     History of Present Illness:   Matrice is a 47 year old female admitted from the ED as recommended by her general surgeon Dr. Jeri Modena. On 5/5 patient was admitted to the hospital, during her course of stay she underwent bidirectional scopes 5/7 and an exploratory laparotomy with small bowel resection and removal of adjacent enlarged mesenteric lymph nodes on 5/8. Patient reports she was doing well  at home until recently she had increasing abdominal pain with nausea and vomiting, she denies hematemesis. Patient denied any other significant symptoms aside from generalized weakness. She discussed symptoms with Dr. Cliffton Asters, they were concerned for ileus/dehydration, and patient was instructed to present to the ED for further evaluation. Workup in the ED is significant for partial SBO and ascites, seen on CT AP. Laboratory workup is significant for elevated LFTs, and dehydration. 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       Patient seen and examined at bedside, husband present via phone. Dr. Grace Bushy present during visit, he discussed pathology and plan including PET scan and possible treatment with Sandostatin. Patient and husband voice understanding, and took the information as good news. Patient states her pain is minimal. She has not had a BM as of yet but she is passing gas. Patient is afebrile. VSS. Maintained on RA. No distress noted.     Impression/Plan    Neuroendocrine tumor  -Newly diagnosed   -Reportedly was experiencing RUQ pain for the past 6 months  -S/p Small bowel resection 5/5. 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 Lymph nodes were positive, surgical margins  negative.   -No evidence of liver involvement   -Chromogranin A WNL   -Check Urine 5HIAA  -Will need dotatate PET outpatient, this will determine need for treatment with Sandostatin or surveillance   -Have arranged for prompt follow up with Dr. Grace Bushy upon discharge     Anemia  -Likely multifactorial secondary to recent surgery, poor oral intake, can not fully exclude other etiology at this time  -Looking back it appears patient has had microcytic anemia since at least 2020  -Hgb 9.8(12.5) drop likely secondary to hemodilution from fluid boluses on admission  -Microcytosis noted, MCV 71.6  -Will send serologic workup and replace as necessary  -Monitor counts   -Transfuse for Hgb <7    Partial SBO vs Ileus   -Started on liquid diet, tolerating well   -As per surgical team    No objection to discharge once cleared by other services.       Thank you for this consult. Patient discussed with Dr. Grace Bushy. Please call with questions.   PMH:   Past Medical History:   Diagnosis Date    Bursitis     Fibromyalgia     Scoliosis     Tendinitis         PSH:   Past Surgical History:   Procedure Laterality Date    ABDOMINOPLASTY      COLONOSCOPY N/A 11/18/2022    COLONOSCOPY POLYPECTOMY SNARE BIOPSY performed by Marcia Brash, MD at [facility] [place] [facility]    ESOPHAGOGASTRIC FUNDOPLICATION  2000    GALLBLADDER SURGERY      LAPAROTOMY N/A 11/19/2022  LAPAROTOMY EXPLORATORY.  SMALL BOWEL RESECTION.  INCISIONAL BIOPSY OF MESENTERIC LYMPH NODE performed by Lucious Groves, MD at [facility] OR    TUBAL LIGATION      UPPER GASTROINTESTINAL ENDOSCOPY N/A 11/18/2022    ESOPHAGOGASTRODUODENOSCOPY BIOPSY performed by Marcia Brash, MD at [facility] [place] [facility]        Allergies:   Allergies   Allergen Reactions    Oxycodone Anaphylaxis    Parsley Leaves Anaphylaxis, Hives, Itching and Shortness Of Breath    Percocet [Oxycodone-Acetaminophen] Shortness Of Breath    Codeine      Anaphylaxis     Morphine         Home Meds:  Medications Prior to Admission:  traMADol (ULTRAM) 50 MG tablet, Take 1 tablet by mouth every 6 hours as needed for Pain for up to 5 days. Intended supply: 3 days. Take lowest dose possible to manage pain Max Daily Amount: 200 mg  famotidine (PEPCID) 40 MG tablet, Take 1 tablet by mouth every evening  DULoxetine (CYMBALTA) 60 MG extended release capsule, take 1 capsule by mouth once daily  albuterol sulfate HFA (PROVENTIL HFA) 108 (90 Base) MCG/ACT inhaler, Inhale 2 puffs into the lungs every 6 hours as needed for Wheezing or Shortness of Breath  DULoxetine (CYMBALTA) 30 MG extended release capsule, Take 1 capsule by mouth daily  buPROPion (WELLBUTRIN XL) 150 MG extended release tablet, Take 1 tablet by mouth every morning  tiZANidine (ZANAFLEX) 2 MG tablet, Take 1 tablet by mouth daily as needed (back muscle) (Patient not taking: Reported on 11/26/2022)  TRAZODONE HCL PO, Take by mouth (Patient not taking: Reported on 11/16/2022)     Social History:  Social History     Socioeconomic History    Marital status: Married     Spouse name: Not on file    Number of children: Not on file    Years of education: Not on file    Highest education level: Not on file   Occupational History    Not on file   Tobacco Use    Smoking status: Never    Smokeless tobacco: Never   Substance and Sexual Activity    Alcohol use: Not Currently     Comment: 3/4 times a week    Drug use: Never    Sexual activity: Not on file   Other Topics Concern    Not on file   Social History Narrative    Not on file     Social Determinants of Health     Financial Resource Strain: Low Risk  (02/19/2022)    Overall Financial Resource Strain (CARDIA)     Difficulty of Paying Living Expenses: Not hard at all   Food Insecurity: No Food Insecurity (11/26/2022)    Hunger Vital Sign     Worried About Running Out of Food in the Last Year: Never true     Ran Out of Food in the Last Year: Never true   Transportation Needs: No Transportation Needs (11/26/2022)    PRAPARE - Product/process development scientist (Medical): No     Lack of Transportation (Non-Medical): No   Physical Activity: Insufficiently Active (02/16/2022)    Exercise Vital Sign     Days of Exercise per Week: 4 days     Minutes of Exercise per Session: 30 min   Stress: Not on file   Social Connections: Not on file   Intimate Partner Violence: Not At Risk (02/16/2022)  Humiliation, Afraid, Rape, and Kick questionnaire     Fear of Current or Ex-Partner: No     Emotionally Abused: No     Physically Abused: No     Sexually Abused: No   Housing Stability: Low Risk  (11/26/2022)    Housing Stability Vital Sign     Unable to Pay for Housing in the Last Year: No     Number of Places Lived in the Last Year: 1     Unstable Housing in the Last Year: No        Review of Systems:   Review of Systems   Negative unless otherwise noted above   Physical Examination :   BP 127/82   Pulse 74   Temp 97.9 F (36.6 C)   Resp 18   Ht 1.626 m (5\' 4" )   Wt 93.4 kg (206 lb)   SpO2 95%   BMI 35.36 kg/m    Temperature Range: Temp: 97.9 F (36.6 C) Temp  Avg: 98.5 F (36.9 C)  Min: 97.7 F (36.5 C)  Max: 99.3 F (37.4 C)  Weight change:      Physical Exam  Constitutional:       General: She is not in acute distress.     Appearance: Normal appearance. She is well-developed. She is obese. She is not ill-appearing or toxic-appearing.   HENT:      Head: Normocephalic and atraumatic.      Mouth/Throat:      Mouth: Mucous membranes are dry.      Pharynx: Oropharynx is clear. No oropharyngeal exudate or posterior oropharyngeal erythema.   Eyes:      Extraocular Movements: Extraocular movements intact.      Conjunctiva/sclera: Conjunctivae normal.      Pupils: Pupils are equal, round, and reactive to light.   Cardiovascular:      Rate and Rhythm: Normal rate and regular rhythm.      Pulses: Normal pulses.      Heart sounds: Normal heart sounds. No murmur heard.     No friction rub. No gallop.   Pulmonary:      Effort: Pulmonary effort is normal.      Breath sounds:  Normal breath sounds. No wheezing.   Abdominal:      General: Bowel sounds are normal. There is distension.      Palpations: Abdomen is soft.      Tenderness: There is abdominal tenderness. There is no guarding or rebound.   Musculoskeletal:         General: No swelling. Normal range of motion.      Cervical back: Normal range of motion and neck supple. No rigidity or tenderness.      Right lower leg: No edema.      Left lower leg: No edema.   Skin:     General: Skin is warm and dry.      Findings: No erythema.      Comments: Healing abdomina incision    Neurological:      General: No focal deficit present.      Mental Status: She is alert and oriented to person, place, and time. Mental status is at baseline.      Sensory: No sensory deficit.   Psychiatric:         Mood and Affect: Mood normal.         Behavior: Behavior normal.         Thought Content: Thought content normal.  Judgment: Judgment normal.      Comments: Tearful, happy to hear this is a low grade cancer           Laboratory data:     Recent Labs     11/26/22  1525 11/27/22  0610   WBC 13.3* 6.8   HGB 12.5 9.8*   HCT 39.9 31.3*   PLT 509* 354   MCV 71.9* 71.6*   NEUTROABS 11.60* 3.30       No results for input(s): "PROTIME", "INR", "DDIMER" in the last 72 hours.    Invalid input(s): "PTT"    Recent Labs     11/26/22  1525 11/27/22  0610   K 4.3 3.5*   CL 100 107   CO2 27 26   BUN 15 11   CREATININE 1.0* 0.8   CALCIUM 11.4* 9.2   BILITOT 0.5  --    ALKPHOS 130*  --    ALT 142*  --    AST 164*  --    MG  --  2.1       TSH:   Lab Results   Component Value Date    TSH 1.12 12/26/2020     VITAMIN B12: No results found for: "VITAMINB12"  FOLATE:  No results found for: "FOLATE"  IRON: No results found for: "IRON", "TIBC", "FERRITIN"  FIBRINOGEN: No results found for: "FIBRINOGEN"     Imaging Studies:   CT ABDOMEN PELVIS W IV CONTRAST Additional Contrast? None    Result Date: 11/26/2022  1. Findings suggestive of a distal partial small bowel obstruction.  2. Persistent but stable appearing mesenteric mass. 3. Mild ascites.     XR CHEST (2 VW)    Result Date: 11/26/2022  No acute process.        Medications:      sodium chloride  80 mL IntraVENous Once    sodium chloride flush  5-40 mL IntraVENous 2 times per day    enoxaparin  40 mg SubCUTAneous Daily    famotidine (PEPCID) injection  20 mg IntraVENous BID     Trinda Pascal, APRN - [facility]  [facility] Hematology and Oncology

## 2022-11-28 LAB — CBC WITH AUTO DIFFERENTIAL
Basophils %: 2 % (ref 0–2)
Basophils Absolute: 0.1 10*3/uL (ref 0.0–0.2)
Eosinophils %: 4 % (ref 1–4)
Eosinophils Absolute: 0.21 10*3/uL (ref 0.0–0.4)
Hematocrit: 31.2 % — ABNORMAL LOW (ref 36–46)
Hemoglobin: 9.9 g/dL — ABNORMAL LOW (ref 12.0–16.0)
Lymphocytes %: 33 % (ref 24–44)
Lymphocytes Absolute: 1.72 10*3/uL (ref 1.0–4.8)
MCH: 22.6 pg — ABNORMAL LOW (ref 26–34)
MCHC: 31.9 g/dL (ref 31–37)
MCV: 71.1 fL — ABNORMAL LOW (ref 80–100)
MPV: 7.5 fL (ref 6.0–12.0)
Monocytes %: 19 % — ABNORMAL HIGH (ref 1–7)
Monocytes Absolute: 0.99 10*3/uL — ABNORMAL HIGH (ref 0.1–0.8)
Neutrophils %: 42 % (ref 36–66)
Neutrophils Absolute: 2.18 10*3/uL (ref 1.8–7.7)
Platelets: 330 10*3/uL (ref 140–450)
RBC: 4.39 m/uL (ref 4.0–5.2)
RDW: 13.6 % (ref 12.5–15.4)
WBC: 5.2 10*3/uL (ref 3.5–11.0)

## 2022-11-28 LAB — URINALYSIS
Bilirubin, Urine: NEGATIVE
Glucose, Ur: NEGATIVE mg/dL
Ketones, Urine: NEGATIVE mg/dL
Leukocyte Esterase, Urine: NEGATIVE
Nitrite, Urine: NEGATIVE
Protein, UA: NEGATIVE mg/dL
Specific Gravity, UA: 1.01 (ref 1.005–1.030)
Urine Hgb: NEGATIVE
Urobilinogen, Urine: NORMAL EU/dL (ref 0.0–1.0)
pH, Urine: 7.5 (ref 5.0–8.0)

## 2022-11-28 LAB — BASIC METABOLIC PANEL
Anion Gap: 10 mmol/L (ref 9–17)
BUN: 4 mg/dL — ABNORMAL LOW (ref 6–20)
CO2: 25 mmol/L (ref 20–31)
Calcium: 9.5 mg/dL (ref 8.6–10.4)
Chloride: 105 mmol/L (ref 98–107)
Creatinine: 0.7 mg/dL (ref 0.5–0.9)
Est, Glom Filt Rate: >90 mL/min/{1.73_m2} (ref >60)
Glucose: 115 mg/dL — ABNORMAL HIGH (ref 70–99)
Potassium: 3.7 mmol/L (ref 3.7–5.3)
Sodium: 140 mmol/L (ref 135–144)

## 2022-11-28 LAB — PERIPHERAL BLOOD SMEAR, PATH REVIEW

## 2022-11-28 LAB — VITAMIN B12 & FOLATE
Folate: 12 ng/mL (ref 4.8–24.2)
Vitamin B-12: 1530 pg/mL — ABNORMAL HIGH (ref 232–1245)

## 2022-11-28 LAB — MAGNESIUM: Magnesium: 1.8 mg/dL (ref 1.6–2.6)

## 2022-11-28 LAB — SURGICAL PATHOLOGY REPORT

## 2022-11-28 MED FILL — FAMOTIDINE (PF) 20 MG/2ML IV SOLN: 20 MG/2ML | INTRAVENOUS | Qty: 2

## 2022-11-28 MED FILL — ONDANSETRON 4 MG PO TBDP: 4 MG | ORAL | Qty: 1

## 2022-11-28 MED FILL — DILAUDID 1 MG/ML IJ SOLN: 1 MG/ML | INTRAMUSCULAR | Qty: 0.5

## 2022-11-28 MED FILL — ENOXAPARIN SODIUM 40 MG/0.4ML IJ SOSY: 40 MG/0.4ML | INTRAMUSCULAR | Qty: 0.4

## 2022-11-28 MED FILL — ACETAMINOPHEN 325 MG PO TABS: 325 MG | ORAL | Qty: 2

## 2022-11-28 NOTE — Plan of Care (Signed)
Problem: Discharge Planning  Goal: Discharge to home or other facility with appropriate resources  Outcome: Progressing  Flowsheets  Taken 11/28/2022 0404  Discharge to home or other facility with appropriate resources:   Identify barriers to discharge with patient and caregiver   Arrange for needed discharge resources and transportation as appropriate   Identify discharge learning needs (meds, wound care, etc)   Refer to discharge planning if patient needs post-hospital services based on physician order or complex needs related to functional status, cognitive ability or social support system  Taken 11/28/2022 0019  Discharge to home or other facility with appropriate resources:   Identify barriers to discharge with patient and caregiver   Arrange for needed discharge resources and transportation as appropriate   Identify discharge learning needs (meds, wound care, etc)   Refer to discharge planning if patient needs post-hospital services based on physician order or complex needs related to functional status, cognitive ability or social support system  Taken 11/27/2022 2151  Discharge to home or other facility with appropriate resources:   Identify barriers to discharge with patient and caregiver   Arrange for needed discharge resources and transportation as appropriate   Identify discharge learning needs (meds, wound care, etc)   Refer to discharge planning if patient needs post-hospital services based on physician order or complex needs related to functional status, cognitive ability or social support system     Problem: Pain  Goal: Verbalizes/displays adequate comfort level or baseline comfort level  Outcome: Progressing  Flowsheets  Taken 11/28/2022 0404  Verbalizes/displays adequate comfort level or baseline comfort level:   Encourage patient to monitor pain and request assistance   Assess pain using appropriate pain scale   Administer analgesics based on type and severity of pain and evaluate response    Implement non-pharmacological measures as appropriate and evaluate response   Notify Licensed Independent Practitioner if interventions unsuccessful or patient reports new pain  Taken 11/27/2022 2015  Verbalizes/displays adequate comfort level or baseline comfort level:   Encourage patient to monitor pain and request assistance   Assess pain using appropriate pain scale   Administer analgesics based on type and severity of pain and evaluate response   Notify Licensed Independent Practitioner if interventions unsuccessful or patient reports new pain     Problem: ABCDS Injury Assessment  Goal: Absence of physical injury  Outcome: Progressing  Flowsheets (Taken 11/27/2022 2151)  Absence of Physical Injury: Implement safety measures based on patient assessment

## 2022-11-28 NOTE — Progress Notes (Signed)
[facility] Hematology and Oncology - Daily Progress Note  11/28/2022, 8:18 AM    Admit Date: 11/26/2022  PCP: Levander Campion, APRN - CNP    Impression/Plan:   Neuroendocrine tumor  -Newly diagnosed   -Reportedly was experiencing RUQ pain for the past 6 months  -S/p Small bowel resection with lymph node biopsy on 5/5. 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 Lymph nodes were positive, surgical margins negative.   -No evidence of liver involvement   -Chromogranin A WNL   -Urine 5HIAA in process   -Will need dotatate PET outpatient, this will determine need for treatment with Sandostatin or surveillance   -Have arranged for prompt follow up with Dr. Grace Bushy upon discharge      Anemia  -Likely multifactorial secondary to recent surgery, poor oral intake, can not fully exclude other etiology at this time  -Looking back it appears patient has had microcytic anemia since at least 2020  -Hgb 9.9(9.8,12.5) drop likely secondary to hemodilution from fluid boluses on admission. Baseline appears to be near 11.0  -Microcytosis noted, MCV 71.6  -Iron studies wnl, B12 and folate pending   -Monitor counts   -Transfuse for Hgb <7     Partial SBO vs Ileus   -Started on liquid diet, tolerating well   -As per surgical team     No objection to discharge once cleared by other services.          Patient discussed with Dr. Grace Bushy. Please call with questions.     Subjective/Interval History:   Patient seen and examined at bedside, currently up to chair. Patient is afebrile. VSS. Maintained on RA. No distress noted   Physical Examination :   Patient Vitals for the past 96 hrs (Last 3 readings):   Weight   11/26/22 1446 93.4 kg (206 lb)      Temperature Range: Temp: 99 F (37.2 C) Temp  Avg: 98.5 F (36.9 C)  Min: 97.9 F (36.6 C)  Max: 99 F (37.2 C)  Weight change:     Physical Exam   Physical Exam  Constitutional:       General: She is not in acute distress.     Appearance: Normal appearance. She is  well-developed. She is obese. She is not ill-appearing or toxic-appearing.   HENT:      Head: Normocephalic and atraumatic.      Mouth/Throat:      Mouth: Mucous membranes are dry.      Pharynx: Oropharynx is clear. No oropharyngeal exudate or posterior oropharyngeal erythema.   Eyes:      Extraocular Movements: Extraocular movements intact.      Conjunctiva/sclera: Conjunctivae normal.      Pupils: Pupils are equal, round, and reactive to light.   Cardiovascular:      Rate and Rhythm: Normal rate and regular rhythm.      Pulses: Normal pulses.      Heart sounds: Normal heart sounds. No murmur heard.     No friction rub. No gallop.   Pulmonary:      Effort: Pulmonary effort is normal.      Breath sounds: Normal breath sounds. No wheezing.   Abdominal:      General: Bowel sounds are normal. There is distension.      Palpations: Abdomen is soft.      Tenderness: There is abdominal tenderness. There is no guarding or rebound.   Musculoskeletal:         General: No swelling. Normal range of motion.  Cervical back: Normal range of motion and neck supple. No rigidity or tenderness.      Right lower leg: No edema.      Left lower leg: No edema.   Skin:     General: Skin is warm and dry.      Findings: No erythema.      Comments: Healing abdomina incision    Neurological:      General: No focal deficit present.      Mental Status: She is alert and oriented to person, place, and time. Mental status is at baseline.      Sensory: No sensory deficit.   Psychiatric:         Mood and Affect: Mood normal.         Behavior: Behavior normal.         Thought Content: Thought content normal.         Judgment: Judgment normal.     Laboratory data:        Recent Labs     11/26/22  1525 11/27/22  0610 11/28/22  0518   WBC 13.3* 6.8 5.2   HGB 12.5 9.8* 9.9*   HCT 39.9 31.3* 31.2*   PLT 509* 354 330   MCV 71.9* 71.6* 71.1*   NEUTROABS 11.60* 3.30 2.18       No results for input(s): "PROTIME", "INR", "DDIMER" in the last 72  hours.    Invalid input(s): "PTT"    Recent Labs     11/26/22  1525 11/27/22  0610 11/28/22  0518   K 4.3 3.5* 3.7   CL 100 107 105   CO2 27 26 25    BUN 15 11 4*   CREATININE 1.0* 0.8 0.7   CALCIUM 11.4* 9.2 9.5   BILITOT 0.5  --   --    ALKPHOS 130*  --   --    ALT 142*  --   --    AST 164*  --   --    MG  --  2.1 1.8       TSH:   Lab Results   Component Value Date    TSH 1.12 12/26/2020       IRON:   Lab Results   Component Value Date    IRON 79 11/27/2022    TIBC 244 (L) 11/27/2022    FERRITIN 185 (H) 11/27/2022        Medications:      sodium chloride  80 mL IntraVENous Once    sodium chloride flush  5-40 mL IntraVENous 2 times per day    enoxaparin  40 mg SubCUTAneous Daily    famotidine (PEPCID) injection  20 mg IntraVENous BID     Trinda Pascal, APRN - [facility]  [facility] Hematology and Oncology

## 2022-11-28 NOTE — Care Coordination-Inpatient (Signed)
Case Management Assessment  Initial Evaluation    Date/Time of Evaluation: 11/28/2022 11:13 AM  Assessment Completed by: Genevieve Norlander, RN    If patient is discharged prior to next notation, then this note serves as note for discharge by case management.    Patient Name: Monica Wood                   Date of Birth: 05-10-1976  Diagnosis: Dehydration [E86.0]  Small bowel obstruction (HCC) [K56.609]  Ileus, postoperative (HCC) [K91.89, K56.7]  Pain of upper abdomen [R10.10]  Nausea and vomiting, unspecified vomiting type [R11.2]                   Date / Time: 11/26/2022  2:45 PM    Patient Admission Status: Observation   Readmission Risk (Low < 19, Mod (19-27), High > 27): Readmission Risk Score: 5.4    Current PCP: Monica Campion, APRN - CNP  PCP verified by CM? Yes    Chart Reviewed: Yes      History Provided by: Patient  Patient Orientation: Alert and Oriented    Patient Cognition: Alert    Hospitalization in the last 30 days (Readmission):  Yes    If yes, Readmission Assessment in CM Navigator will be completed.    Advance Directives:      Code Status: Full Code   Patient's Primary Decision Maker is: Legal Next of Kin      Discharge Planning:    Patient lives with: Spouse/Significant Other, Children Type of Home: House  Primary Care Giver: Self  Patient Support Systems include: Spouse/Significant Other, Parent, Children, Family Members, Friends/Neighbors   Current Financial resources: Other (Comment) ([employer])  Current community resources: None  Current services prior to admission: None            Current DME:  none            Type of Home Care services:  None    ADLS  Prior functional level: Independent in ADLs/IADLs  Current functional level: Independent in ADLs/IADLs    PT AM-PAC:   /24  OT AM-PAC:   /24    Family can provide assistance at DC: Yes  Would you like Case Management to discuss the discharge plan with any other family members/significant others, and if so, who? No  Plans to Return to  Present Housing: Yes  Other Identified Issues/Barriers to RETURNING to current housing: none  Potential Assistance needed at discharge: N/A            Potential DME:  none  Patient expects to discharge to: House  Plan for transportation at discharge: Family    Financial    Payor: [employer] / Plan: [employer] / Product Type: *No Product type* /     Does insurance require precert for SNF: Yes    Potential assistance Purchasing Medications: No  Meds-to-Beds request:        [id number] [place], [place] - [address] - P [phone] Carmon Ginsberg [phone]  [address]  [place] [address]  Phone: [phone] Fax: [phone]      Notes:    Factors facilitating achievement of predicted outcomes: Family support, Motivated, Cooperative, and Pleasant    Barriers to discharge: Decreased endurance    Additional Case Management Notes: Met with patient at bedside.  Lives with husband and son, independent and works as Scientist, physiological for [employer].  Admitted for vomiting and dehydration.  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  Told by surgery that tumor is not resectable.  Pt will D/C home today and follow up OP with surgery and medical oncology to discuss possible trials.      The Plan for Transition of Care is related to the following treatment goals of Dehydration [E86.0]  Small bowel obstruction (HCC) [K56.609]  Ileus, postoperative (HCC) [K91.89, K56.7]  Pain of upper abdomen [R10.10]  Nausea and vomiting, unspecified vomiting type [R11.2]    IF APPLICABLE: The Patient and/or patient representative [name] and her family were provided with a choice of provider and agrees with the discharge plan. Freedom of choice list with basic dialogue that supports the patient's individualized plan of care/goals and shares the quality data associated with the providers was provided to:  (N/A)   Patient Representative Name:       The Patient and/or Patient Representative Agree with  the Discharge Plan?  yes    Genevieve Norlander, RN  Case Management Department  Ph:  Fax:

## 2022-11-28 NOTE — Progress Notes (Signed)
Subjective    She is able to pass gas and stool and tolerate liquids.  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  That this is something that we cannot cure her of.  That I expect her survival to be measured in a number of years.  I have told her if she develops flushing or problems with diarrhea we could certainly consider octreotide.  She will be appropriately followed up by medical oncology to see if there is anything else to offer.  She is anxious to go home for her son's high school graduation this weekend.    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  We have discussed that this is something that we can help her with symptoms for.  We do not have any good curative treatment for her.  Where her mass is with the blood supply to the bowel involved with this root of the mesentery mass I do not think that she can be resected.  We have discussed that I expect her survival to be measured in years not days or months.  As she can tolerate liquids and pass gas we will go ahead and discharge her to home.  She is anxious to go home for her son's high school graduation this weekend.  We will have her follow-up with medical oncology and with [place] as an outpatient.

## 2022-11-28 NOTE — Discharge Summary (Signed)
Discharge Summary    Date: 11/28/2022  Patient Name: Monica Wood    Date of Birth: 01-17-1976     Age: 47 y.o.    Admit Date: 11/26/2022  Discharge Date: 11/28/2022  Discharge Condition: Stable    Admission Diagnosis  Dehydration [E86.0];Small bowel obstruction (HCC) [K56.609];Ileus, postoperative (HCC) [K91.89, K56.7];Pain of upper abdomen [R10.10];Nausea and vomiting, unspecified vomiting type [R11.2]      Discharge Diagnosis  Principal Problem:    Ileus, postoperative (HCC)  Resolved Problems:    * No resolved hospital problems. [facility] Stay  Narrative of Hospital Course:  47 year old that was admitted after a hospitalization for small bowel resection with mesenteric mass biopsy.  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  She is passing gas at this time.  She has met with medical oncology.  She will follow-up with both [place] and medical oncology as an outpatient.  I have told her that her tumor is not resectable.  That there is no real good treatment for the tumor itself.  That we can help palliate symptoms with Sandostatin.  She will follow-up with medical oncology to discuss this further.  Also to consider any possible trials that may be out there.  We are going to discharge her to home today.  She is comfortable with that if she is able to eat and pass gas.  She is anxious to go home as her son is graduating from high school this weekend.    Consultants:  IP CONSULT TO ONCOLOGY    Surgeries/procedures Performed:      Treatments:    Analgesia and IV Hydration        Discharge Plan/Disposition:  Home    Hospital/Incidental Findings Requiring Follow Up:    Patient Instructions:    Diet:    Activity:Activity as Tolerated  For number of days (if applicable):      Other Instructions: Follow-up with medical oncology  Follow-up with surgery    Provider Follow-Up:   No follow-ups on file.     Significant Diagnostic Studies:    Recent Labs:  Admission on 11/26/2022  WBC                                            Date: 11/26/2022  Value: 13.3 (H)    Ref range: 3.5 - 11.0 k/uL    Status: Final  RBC                                           Date: 11/26/2022  Value: 5.55 (H)    Ref range: 4.0 - 5.2 m/uL     Status: Final  Hemoglobin                                    Date: 11/26/2022  Value: 12.5        Ref range: 12.0 - 16.0 g/dL   Status: Final  Hematocrit                                    Date: 11/26/2022  Value: 39.9        Ref range: 36 - 46 %          Status: Final  MCV                                           Date: 11/26/2022  Value: 71.9 (L)    Ref range: 80 - 100 fL        Status: Final  MCH                                           Date: 11/26/2022  Value: 22.6 (L)    Ref range: 26 - 34 pg         Status: Final  MCHC                                          Date: 11/26/2022  Value: 31.4        Ref range: 31 - 37 g/dL       Status: Final  RDW                                           Date: 11/26/2022  Value: 13.8        Ref range: 12.5 - 15.4 %      Status: Final  Platelets                                     Date: 11/26/2022  Value: 509 (H)     Ref range: 140 - 450 k/uL     Status: Final  MPV                                           Date: 11/26/2022  Value: 7.7         Ref range: 6.0 - 12.0 fL      Status: Final  Neutrophils %                                 Date: 11/26/2022  Value: 87 (H)      Ref range: 36 - 66 %          Status: Final  Lymphocytes %                                 Date: 11/26/2022  Value: 7 (L)       Ref range: 24 - 44 %          Status: Final  Monocytes %                                   Date: 11/26/2022  Value: 6  Ref range: 2 - 11 %           Status: Final  Eosinophils %                                 Date: 11/26/2022  Value: 0 (L)       Ref range: 1 - 4 %            Status: Final  Basophils %                                   Date: 11/26/2022  Value: 0           Ref range: 0 - 2 %            Status: Final  Neutrophils Absolute                           Date: 11/26/2022  Value: 11.60 (H)   Ref range: 1.8 - 7.7 k/uL     Status: Final  Lymphocytes Absolute                          Date: 11/26/2022  Value: 0.90 (L)    Ref range: 1.0 - 4.8 k/uL     Status: Final  Monocytes Absolute                            Date: 11/26/2022  Value: 0.70        Ref range: 0.1 - 1.2 k/uL     Status: Final  Eosinophils Absolute                          Date: 11/26/2022  Value: 0.10        Ref range: 0.0 - 0.4 k/uL     Status: Final  Basophils Absolute                            Date: 11/26/2022  Value: 0.00        Ref range: 0.0 - 0.2 k/uL     Status: Final  Sodium                                        Date: 11/26/2022  Value: 140         Ref range: 135 - 144 mmol/L   Status: Final  Potassium                                     Date: 11/26/2022  Value: 4.3         Ref range: 3.7 - 5.3 mmol/L   Status: Final  Chloride                                      Date: 11/26/2022  Value: 100         Ref range: 98 - 107 mmol/L    Status:  Final  CO2                                           Date: 11/26/2022  Value: 27          Ref range: 20 - 31 mmol/L     Status: Final  Anion Gap                                     Date: 11/26/2022  Value: 13          Ref range: 9 - 17 mmol/L      Status: Final  Glucose                                       Date: 11/26/2022  Value: 142 (H)     Ref range: 70 - 99 mg/dL      Status: Final  BUN                                           Date: 11/26/2022  Value: 15          Ref range: 6 - 20 mg/dL       Status: Final  Creatinine                                    Date: 11/26/2022  Value: 1.0 (H)     Ref range: 0.5 - 0.9 mg/dL    Status: Final  Est, Glom Filt Rate                           Date: 11/26/2022  Value: 70          Ref range: >60 mL/min/1.63m2  Status: Final                Comment:       These results are not intended for use in patients <83 years of age.        eGFR results are calculated without a race factor using the 2021 CKD-EPI equation.  Careful  clinical correlation is recommended, particularly when comparing to results   calculated using previous equations.  The CKD-EPI equation is less accurate in patients with extremes of muscle mass, extra-renal   metabolism of creatine, excessive creatine ingestion, or following therapy that affects   renal tubular secretion.    Calcium                                       Date: 11/26/2022  Value: 11.4 (H)    Ref range: 8.6 - 10.4 mg/dL   Status: Final  Lipase                                        Date: 11/26/2022  Value: 13  Ref range: 13 - 60 U/L        Status: Final  Albumin                                       Date: 11/26/2022  Value: 4.3         Ref range: 3.5 - 5.2 g/dL     Status: Final  Alkaline Phosphatase                          Date: 11/26/2022  Value: 130 (H)     Ref range: 35 - 104 U/L       Status: Final  ALT                                           Date: 11/26/2022  Value: 142 (H)     Ref range: 5 - 33 U/L         Status: Final  AST                                           Date: 11/26/2022  Value: 164 (H)     Ref range: <32 U/L            Status: Final  Total Bilirubin                               Date: 11/26/2022  Value: 0.5         Ref range: 0.3 - 1.2 mg/dL    Status: Final  Bilirubin, Direct                             Date: 11/26/2022  Value: 0.2         Ref range: <0.3 mg/dL         Status: Final  Bilirubin, Indirect                           Date: 11/26/2022  Value: 0.3         Ref range: 0.0 - 1.0 mg/dL    Status: Final  Total Protein                                 Date: 11/26/2022  Value: 8.3         Ref range: 6.4 - 8.3 g/dL     Status: Final  Albumin/Globulin Ratio                        Date: 11/26/2022  Value: 1.1         Ref range: 1.0 - 2.5          Status: Final  Color, UA                                     Date: 11/28/2022  Value: Yellow  Ref range: Yellow             Status: Final  Turbidity UA                                  Date: 11/28/2022  Value: Clear        Ref range: Clear              Status: Final  Glucose, Ur                                   Date: 11/28/2022  Value: NEGATIVE    Ref range: NEGATIVE mg/dL     Status: Final  Bilirubin, Urine                              Date: 11/28/2022  Value: NEGATIVE    Ref range: NEGATIVE           Status: Final  Ketones, Urine                                Date: 11/28/2022  Value: NEGATIVE    Ref range: NEGATIVE mg/dL     Status: Final  Specific Gravity, UA                          Date: 11/28/2022  Value: 1.010       Ref range: 1.005 - 1.030      Status: Final  Urine Hgb                                     Date: 11/28/2022  Value: NEGATIVE    Ref range: NEGATIVE           Status: Final  pH, Urine                                     Date: 11/28/2022  Value: 7.5         Ref range: 5.0 - 8.0          Status: Final  Protein, UA                                   Date: 11/28/2022  Value: NEGATIVE    Ref range: NEGATIVE mg/dL     Status: Final  Urobilinogen, Urine                           Date: 11/28/2022  Value: Normal      Ref range: 0.0 - 1.0 EU/dL    Status: Final  Nitrite, Urine                                Date: 11/28/2022  Value: NEGATIVE    Ref range: NEGATIVE           Status: Final  Leukocyte Esterase, Urine  Date: 11/28/2022  Value: NEGATIVE    Ref range: NEGATIVE           Status: Final  Comment                                       Date: 11/28/2022  Value: Microscopic exam not performed based on chemical *                       Status: Final  Ventricular Rate                              Date: 11/26/2022  Value: 67          Ref range: BPM                Status: Final  Atrial Rate                                   Date: 11/26/2022  Value: 67          Ref range: BPM                Status: Final  P-R Interval                                  Date: 11/26/2022  Value: 156         Ref range: ms                 Status: Final  QRS Duration                                  Date: 11/26/2022  Value: 90           Ref range: ms                 Status: Final  Q-T Interval                                  Date: 11/26/2022  Value: 434         Ref range: ms                 Status: Final  QTc Calculation (Bazett)                      Date: 11/26/2022  Value: 458         Ref range: ms                 Status: Final  P Axis                                        Date: 11/26/2022  Value: 69          Ref range: degrees            Status: Final  R Axis  Date: 11/26/2022  Value: 47          Ref range: degrees            Status: Final  T Axis                                        Date: 11/26/2022  Value: 48          Ref range: degrees            Status: Final  Troponin, High Sensitivity                    Date: 11/26/2022  Value: 7           Ref range: 0 - 14 ng/L        Status: Final                Comment: High Sensitivity Troponin values cannot be compared with other Troponin methodologies.  Sodium                                        Date: 11/27/2022  Value: 140         Ref range: 135 - 144 mmol/L   Status: Final  Potassium                                     Date: 11/27/2022  Value: 3.5 (L)     Ref range: 3.7 - 5.3 mmol/L   Status: Final  Chloride                                      Date: 11/27/2022  Value: 107         Ref range: 98 - 107 mmol/L    Status: Final  CO2                                           Date: 11/27/2022  Value: 26          Ref range: 20 - 31 mmol/L     Status: Final  Anion Gap                                     Date: 11/27/2022  Value: 7 (L)       Ref range: 9 - 17 mmol/L      Status: Final  Glucose                                       Date: 11/27/2022  Value: 120 (H)     Ref range: 70 - 99 mg/dL      Status: Final  BUN  Date: 11/27/2022  Value: 11          Ref range: 6 - 20 mg/dL       Status: Final  Creatinine                                    Date: 11/27/2022  Value: 0.8         Ref range: 0.5 - 0.9 mg/dL    Status:  Final  Est, Glom Filt Rate                           Date: 11/27/2022  Value: >90         Ref range: >60 mL/min/1.63m2  Status: Final                Comment:       These results are not intended for use in patients <62 years of age.        eGFR results are calculated without a race factor using the 2021 CKD-EPI equation.  Careful clinical correlation is recommended, particularly when comparing to results   calculated using previous equations.  The CKD-EPI equation is less accurate in patients with extremes of muscle mass, extra-renal   metabolism of creatine, excessive creatine ingestion, or following therapy that affects   renal tubular secretion.    Calcium                                       Date: 11/27/2022  Value: 9.2         Ref range: 8.6 - 10.4 mg/dL   Status: Final  WBC                                           Date: 11/27/2022  Value: 6.8         Ref range: 3.5 - 11.0 k/uL    Status: Final  RBC                                           Date: 11/27/2022  Value: 4.38        Ref range: 4.0 - 5.2 m/uL     Status: Final  Hemoglobin                                    Date: 11/27/2022  Value: 9.8 (L)     Ref range: 12.0 - 16.0 g/dL   Status: Final  Hematocrit                                    Date: 11/27/2022  Value: 31.3 (L)    Ref range: 36 - 46 %          Status: Final  MCV  Date: 11/27/2022  Value: 71.6 (L)    Ref range: 80 - 100 fL        Status: Final  MCH                                           Date: 11/27/2022  Value: 22.4 (L)    Ref range: 26 - 34 pg         Status: Final  MCHC                                          Date: 11/27/2022  Value: 31.3        Ref range: 31 - 37 g/dL       Status: Final  RDW                                           Date: 11/27/2022  Value: 13.7        Ref range: 12.5 - 15.4 %      Status: Final  Platelets                                     Date: 11/27/2022  Value: 354         Ref range: 140 - 450 k/uL     Status: Final  MPV                                            Date: 11/27/2022  Value: 7.5         Ref range: 6.0 - 12.0 fL      Status: Final  Neutrophils %                                 Date: 11/27/2022  Value: 48          Ref range: 36 - 66 %          Status: Final  Lymphocytes %                                 Date: 11/27/2022  Value: 31          Ref range: 24 - 44 %          Status: Final  Monocytes %                                   Date: 11/27/2022  Value: 18 (H)      Ref range: 2 - 11 %           Status: Final  Eosinophils %                                 Date: 11/27/2022  Value: 2           Ref range: 1 - 4 %            Status: Final  Basophils %                                   Date: 11/27/2022  Value: 1           Ref range: 0 - 2 %            Status: Final  Neutrophils Absolute                          Date: 11/27/2022  Value: 3.30        Ref range: 1.8 - 7.7 k/uL     Status: Final  Lymphocytes Absolute                          Date: 11/27/2022  Value: 2.10        Ref range: 1.0 - 4.8 k/uL     Status: Final  Monocytes Absolute                            Date: 11/27/2022  Value: 1.20        Ref range: 0.1 - 1.2 k/uL     Status: Final  Eosinophils Absolute                          Date: 11/27/2022  Value: 0.20        Ref range: 0.0 - 0.4 k/uL     Status: Final  Basophils Absolute                            Date: 11/27/2022  Value: 0.10        Ref range: 0.0 - 0.2 k/uL     Status: Final  Magnesium                                     Date: 11/27/2022  Value: 2.1         Ref range: 1.6 - 2.6 mg/dL    Status: Final  Retic %                                       Date: 11/27/2022  Value: 1.3         Ref range: 0.5 - 1.9 %        Status: Final  Absolute Retic #                              Date: 11/27/2022  Value: 0.050       Ref range: 0.030 - 0.080 M/*  Status: Final  Immature Retic Fract                          Date: 11/27/2022  Value: 13.5        Ref range: 2.7 - 18.3 %  Status: Final  Retic Hemoglobin                               Date: 11/27/2022  Value: 24.5 (L)    Ref range: 28.2 - 35.7 pg     Status: Final  Iron                                          Date: 11/27/2022  Value: 79          Ref range: 37 - 145 ug/dL     Status: Final  TIBC                                          Date: 11/27/2022  Value: 244 (L)     Ref range: 250 - 450 ug/dL    Status: Final  Iron % Saturation                             Date: 11/27/2022  Value: 32          Ref range: 20 - 55 %          Status: Final  UIBC                                          Date: 11/27/2022  Value: 165         Ref range: 112 - 347 ug/dL    Status: Final  Ferritin                                      Date: 11/27/2022  Value: 185 (H)     Ref range: 13 - 150 ng/mL     Status: Final                Comment:       FERRITIN Reference Ranges:  Adult Males   20 - 60 years:    30 - 400 ng/mL  Adult females 17 - 60 years:    13 - 150 ng/mL  Adults greater than 60 years:   no established reference range  Pediatrics:  no established reference range    Haptoglobin                                   Date: 11/27/2022  Value: 216 (H)     Ref range: 30 - 200 mg/dL     Status: Final  LD                                            Date: 11/27/2022  Value: 174         Ref range: 135 - 214 U/L      Status: Final  Sodium  Date: 11/28/2022  Value: 140         Ref range: 135 - 144 mmol/L   Status: Final  Potassium                                     Date: 11/28/2022  Value: 3.7         Ref range: 3.7 - 5.3 mmol/L   Status: Final  Chloride                                      Date: 11/28/2022  Value: 105         Ref range: 98 - 107 mmol/L    Status: Final  CO2                                           Date: 11/28/2022  Value: 25          Ref range: 20 - 31 mmol/L     Status: Final  Anion Gap                                     Date: 11/28/2022  Value: 10          Ref range: 9 - 17 mmol/L      Status: Final  Glucose                                       Date:  11/28/2022  Value: 115 (H)     Ref range: 70 - 99 mg/dL      Status: Final  BUN                                           Date: 11/28/2022  Value: 4 (L)       Ref range: 6 - 20 mg/dL       Status: Final  Creatinine                                    Date: 11/28/2022  Value: 0.7         Ref range: 0.5 - 0.9 mg/dL    Status: Final  Est, Glom Filt Rate                           Date: 11/28/2022  Value: >90         Ref range: >60 mL/min/1.31m2  Status: Final                Comment:       These results are not intended for use in patients <66 years of age.        eGFR results are calculated without a race factor using the 2021 CKD-EPI equation.  Careful clinical correlation is recommended, particularly when comparing to results   calculated using previous  equations.  The CKD-EPI equation is less accurate in patients with extremes of muscle mass, extra-renal   metabolism of creatine, excessive creatine ingestion, or following therapy that affects   renal tubular secretion.    Calcium                                       Date: 11/28/2022  Value: 9.5         Ref range: 8.6 - 10.4 mg/dL   Status: Final  Magnesium                                     Date: 11/28/2022  Value: 1.8         Ref range: 1.6 - 2.6 mg/dL    Status: Final  WBC                                           Date: 11/28/2022  Value: 5.2         Ref range: 3.5 - 11.0 k/uL    Status: Final  RBC                                           Date: 11/28/2022  Value: 4.39        Ref range: 4.0 - 5.2 m/uL     Status: Final  Hemoglobin                                    Date: 11/28/2022  Value: 9.9 (L)     Ref range: 12.0 - 16.0 g/dL   Status: Final  Hematocrit                                    Date: 11/28/2022  Value: 31.2 (L)    Ref range: 36 - 46 %          Status: Final  MCV                                           Date: 11/28/2022  Value: 71.1 (L)    Ref range: 80 - 100 fL        Status: Final  MCH                                           Date:  11/28/2022  Value: 22.6 (L)    Ref range: 26 - 34 pg         Status: Final  MCHC                                          Date: 11/28/2022  Value: 31.9        Ref range: 31 - 37 g/dL       Status: Final  RDW                                           Date: 11/28/2022  Value: 13.6        Ref range: 12.5 - 15.4 %      Status: Final  Platelets                                     Date: 11/28/2022  Value: 330         Ref range: 140 - 450 k/uL     Status: Final  MPV                                           Date: 11/28/2022  Value: 7.5         Ref range: 6.0 - 12.0 fL      Status: Final  Neutrophils %                                 Date: 11/28/2022  Value: 42          Ref range: 36 - 66 %          Status: Final  Lymphocytes %                                 Date: 11/28/2022  Value: 33          Ref range: 24 - 44 %          Status: Final  Monocytes %                                   Date: 11/28/2022  Value: 19 (H)      Ref range: 1 - 7 %            Status: Final  Eosinophils %                                 Date: 11/28/2022  Value: 4           Ref range: 1 - 4 %            Status: Final  Basophils %                                   Date: 11/28/2022  Value: 2           Ref range: 0 - 2 %            Status: Final  Neutrophils Absolute                          Date: 11/28/2022  Value: 2.18  Ref range: 1.8 - 7.7 k/uL     Status: Final  Lymphocytes Absolute                          Date: 11/28/2022  Value: 1.72        Ref range: 1.0 - 4.8 k/uL     Status: Final  Monocytes Absolute                            Date: 11/28/2022  Value: 0.99 (H)    Ref range: 0.1 - 0.8 k/uL     Status: Final  Eosinophils Absolute                          Date: 11/28/2022  Value: 0.21        Ref range: 0.0 - 0.4 k/uL     Status: Final  Basophils Absolute                            Date: 11/28/2022  Value: 0.10        Ref range: 0.0 - 0.2 k/uL     Status: Final  Morphology                                    Date: 11/28/2022  Value:  HYPOCHROMIA PRESENT                       Status: Final  Morphology                                    Date: 11/28/2022  Value: MICROCYTOSIS PRESENT                       Status: Final  ------------    Radiology last 7 days:  CT ABDOMEN PELVIS W IV CONTRAST Additional Contrast? None    Result Date: 11/26/2022  1. Findings suggestive of a distal partial small bowel obstruction. 2. Persistent but stable appearing mesenteric mass. 3. Mild ascites.     XR CHEST (2 VW)    Result Date: 11/26/2022  No acute process.        Pending Labs     Order Current Status    5 HIAA, URINE In process    CHROMOGRANIN A In process    Path Review, Smear In process    Vitamin B12 & Folate In process        Discharge Medications    Current Discharge Medication List        Current Discharge Medication List        Current Discharge Medication List    CONTINUE these medications which have NOT CHANGED    traMADol (ULTRAM) 50 MG tablet  Take 1 tablet by mouth every 6 hours as needed for Pain for up to 5 days. Intended supply: 3 days. Take lowest dose possible to manage pain Max Daily Amount: 200 mg  Qty: 15 tablet Refills: 0  Comments: Reduce doses taken as pain becomes manageable  Associated Diagnoses:Mesenteric mass    famotidine (PEPCID) 40 MG tablet  Take 1 tablet by mouth every evening  Qty: 30 tablet Refills: 3    !!  DULoxetine (CYMBALTA) 60 MG extended release capsule  take 1 capsule by mouth once daily  Qty: 90 capsule Refills: 1  Comments: Total 90 mg a day    albuterol sulfate HFA (PROVENTIL HFA) 108 (90 Base) MCG/ACT inhaler  Inhale 2 puffs into the lungs every 6 hours as needed for Wheezing or Shortness of Breath  Qty: 18 g Refills: 0  Associated Diagnoses:Chest tightness; Bronchitis    !! DULoxetine (CYMBALTA) 30 MG extended release capsule  Take 1 capsule by mouth daily  Qty: 90 capsule Refills: 1  Comments: Total 90 mg    buPROPion (WELLBUTRIN XL) 150 MG extended release tablet  Take 1 tablet by mouth every morning  Qty: 90 tablet  Refills: 1    tiZANidine (ZANAFLEX) 2 MG tablet  Take 1 tablet by mouth daily as needed (back muscle)  Qty: 30 tablet Refills: 1  Associated Diagnoses:Chronic bilateral low back pain without sciatica    TRAZODONE HCL PO  Take by mouth    !! - Potential duplicate medications found. Please discuss with provider.          Current Discharge Medication List        Time Spent on Discharge:  minutes were spent in patient examination, evaluation, counseling as well as medication reconciliation, prescriptions for required medications, discharge plan, and follow up.    Electronically signed by Noah Delaine, MD on 11/28/22 at 8:52 AM EDT

## 2022-11-29 LAB — CHROMOGRANIN A: Chromogranin A: 77 ng/mL (ref 0–187)

## 2022-12-01 ENCOUNTER — Encounter: Payer: PRIVATE HEALTH INSURANCE | Primary: Nurse Practitioner

## 2022-12-01 LAB — 5 HIAA, URINE
5-HIAA, Ur: 8.8 mg/L
5-HIAA,Urine: 4 mg/gCR (ref 0–14)
Creatinine Urine /volume: 236 mg/dL

## 2022-12-01 NOTE — Care Coordination-Inpatient (Signed)
Care Transitions Initial Follow Up Call    Call within 2 business days of discharge: Yes    Patient Current Location:  [place]    Care Transition Nurse contacted the patient by telephone to perform post hospital discharge assessment. Verified name and DOB with patient as identifiers. Provided introduction to self, and explanation of the Care Transition Nurse role.     Patient: Monica Wood Patient DOB: [birth year]   MRN: [id number]  Reason for Admission: small bowel obstruction; nausea and vomiting; upper abdominal pain; dehydration  Discharge Date: 11/28/22 RARS: Readmission Risk Score: 5.4      Last Discharge Facility       Date Complaint Diagnosis Description Type Department Provider    11/26/22 Emesis; Abdominal Pain Small bowel obstruction (HCC) ... ED to Hosp-Admission (Discharged) (ADMITTED) [facility] White, Risa Grill, MD; Audrie Lia...            Was this an external facility discharge? No Discharge Facility: [facility]    Challenges to be reviewed by the provider   Additional needs identified to be addressed with provider: No  none               Method of communication with provider: none.    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 She was then readmitted on 11/26/22-11/28/22 for small bowel obstruction; nausea and vomiting; upper abdominal pain; dehydration. Patient states she is now feeling good. Denies n/v/d. Staying hydrated with water and Liquid IV. She is easing back into her diet. Patient states it seems like her bowels are getting back to baseline. States her staples are tender on her abdomen but denies abdominal pain. States incision looks good and no s/s of infection. Afebrile. States she will follow up with Dr. Cliffton Asters on 12/03/22 to remove staples. She will follow up with Dr. Geronimo Boot on 03/09/23. She is waiting for a call back from medical oncology with [facility] for an appointment. States she will need a PET scan too so she will discuss this with their  office as well. States her husband and her daughter will be able to transport her to appointments. States she is returning to work on 12/11/22 and will then apply for intermittent FMLA. Patient states she has no financial concerns at this time.   Patient states she would like for a follow up call next week. I advised patient to contact me any time this week if she has not heard from the oncology office regarding scheduling an appointment and I will help her if needed. Patient verbalized understanding. States she just had their son's graduation over the weekend and today is the first day she has had to really think about things. I advised patient I will be on PTO next week and Silvana Newness, RN will be covering me. Patient verbalized understanding and is agreeable with Annice Pih calling her for a follow up call.    Care Transition Nurse reviewed discharge instructions and red flags with patient who verbalized understanding. The patient was given an opportunity to ask questions and does not have any further questions or concerns at this time. Were discharge instructions available to patient? Yes. Reviewed appropriate site of care based on symptoms and resources available to patient including: PCP  Specialist  Benefits related nurse triage line  Urgent care clinics  When to call 911  MyChart Messaging. The patient agrees to contact the PCP office for questions related to their healthcare.     Advance Care Planning:  Does patient have an Advance Directive: not on file.    Medication reconciliation was performed with patient, who verbalizes understanding of administration of home medications. Medications reviewed, 1111F entered: N/A    Was patient discharged with a pulse oximeter? no    Non-face-to-face services provided:  Obtained and reviewed discharge summary and/or continuity of care documents  Education of patient/family/caregiver/guardian to support self-management-discussed red flags  Assessment and support for  treatment adherence and medication management-no questions or concerns per patient      Care Transitions 24 Hour Call    Schedule Follow Up Appointment with PCP: Completed  Do you have a copy of your discharge instructions?: Yes  Do you have all of your prescriptions and are they filled?: Yes  Have you been contacted by a [facility] Pharmacist?: No  Have you scheduled your follow up appointment?: Yes  How are you going to get to your appointment?: Car - family or friend to transport  Do you feel like you have everything you need to keep you well at home?: Yes  Care Transitions Interventions         Discussed follow-up appointments. If no appointment was previously scheduled, appointment scheduling offered: Yes.   Is follow up appointment scheduled within 7 days of discharge? Yes.    Follow Up  Future Appointments   Date Time Provider Department Center   01/28/2023 12:15 PM Levander Campion, APRN - CNP [facility]   03/09/2023  3:00 PM Daboul, Lorrene Reid, MD [facility]       Care Transition Nurse provided contact information.   Plan for follow up call in 7-10 days  based on severity of symptoms and risk factors.  Plan for next call: symptom management-bm  follow-up appointment-12/03/22 with Dr. Cliffton Asters, if she was able to schedule with oncology    Orma Render, RN

## 2022-12-03 ENCOUNTER — Encounter

## 2022-12-03 MED ORDER — CREON 36000-114000 UNITS PO CPEP
36000-114000 units | ORAL_CAPSULE | Freq: Three times a day (TID) | ORAL | 5 refills | Status: DC
Start: 2022-12-03 — End: 2022-12-22

## 2022-12-03 NOTE — Telephone Encounter (Signed)
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

## 2022-12-03 NOTE — Telephone Encounter (Unsigned)
Pt called and notified of new script sent to pharmacy per PA. Told to monitor for worsening of sx. Pt understood.

## 2022-12-03 NOTE — Telephone Encounter (Signed)
Writer called pt and left v/m asking pt to call back in am to move her appt sooner.  Writer told pt to ask for [facility]

## 2022-12-09 ENCOUNTER — Encounter: Payer: PRIVATE HEALTH INSURANCE | Primary: Nurse Practitioner

## 2022-12-09 ENCOUNTER — Encounter

## 2022-12-09 NOTE — Care Coordination-Inpatient (Signed)
Care Transitions Note    Follow Up Call      Patient Current Location:  Home: [address]  [place] [address]    Care Transition Nurse contacted the patient by telephone. Verified name and DOB as identifiers.    Additional needs identified to be addressed with provider   No needs identified                 Method of communication with provider: none.    Care Summary Note: Pt returned call. She is doing pretty good she said. Incision is clear, she had 1/2 of the staples removed last week and the remaining ones will be removed tomorrow at Dr [facility] office. 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 Pt said her bowels are moving fine, drinking lots of water and is voiding fine. She denies fever. She is walking around the house and is active. She has been started on Creon and she is taking as ordered she said. 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 She also has an appt with GI tomorrow as well . Pt will call me this week if she has any needs or questions until following ACM returns next week.     Plan of care updates since last contact:  Medication upate       Advance Care Planning:   Does patient have an Advance Directive: deferred at this time, will discuss on future follow up. .    Medication Review:  Full medication reconciliation completed during previous call.    Remote Patient Monitoring:  Offered patient enrollment in the Remote Patient Monitoring (RPM) program for in-home monitoring: Patient is not eligible for RPM program because: insurance coverage.    Assessments:  Care Transitions Subsequent and Final Call    Subsequent and Final Calls  Have your medications changed?: Yes  Patient Reports: New order for Creon  Do you have any questions related to your medications?: No  Do you currently have any active services?: No  Do you have any  needs or concerns that I can assist you with?: No  Identified Barriers: None  Care Transitions Interventions  Other Interventions:              Follow Up Appointment:   Reviewed upcoming appointment(s).  Future Appointments         Provider Specialty Dept Phone    12/10/2022 11:00 AM Carlisle Cater [place], [place] [facility]ology [phone]    01/28/2023 12:15 PM Levander Campion, APRN - CNP Primary [facility] [phone]    03/09/2023 3:00 PM Marcia Brash, MD Gastroenterology [phone]            Care Transition Nurse provided contact information.  Plan for follow-up call in 6-10 days based on severity of symptoms and risk factors.  Plan for next call:  outcome from appts with GI and Surgery, Pet Scan , status of incision, elimination       Daryl Eastern, RN

## 2022-12-09 NOTE — Care Coordination-Inpatient (Signed)
Care Transitions Note    Follow Up Call      Attempted to reach patient for transitions of care follow up.  Unable to reach patient.      Outreach Attempts:   HIPAA compliant voicemail left for patient.         Follow Up Appointment:   Future Appointments         Provider Specialty Dept Phone    12/10/2022 11:00 AM Carlisle Cater [place], [place] Gastroenter[facility] [phone]    01/28/2023 12:15 PM Levander Campion, APRN - CNP Primary Care [phone]    03/09/2023 3:00 PM Marcia Brash, MD Gastroenterology [phone]            Plan for follow-up call in 6-10 days based on severity of symptoms and risk factors. Plan for next call:  recently started on Creon. Has appt on 12/10/22 with GI Dr Marion Downer     Daryl Eastern, RN

## 2022-12-10 ENCOUNTER — Ambulatory Visit: Admit: 2022-12-10 | Payer: PRIVATE HEALTH INSURANCE | Attending: Physician Assistant | Primary: Nurse Practitioner

## 2022-12-10 DIAGNOSIS — D3A8 Other benign neuroendocrine tumors: Secondary | ICD-10-CM

## 2022-12-10 MED ORDER — SENNOSIDES 8.6 MG PO TABS
8.6 MG | ORAL_TABLET | Freq: Two times a day (BID) | ORAL | 11 refills | Status: DC
Start: 2022-12-10 — End: 2023-09-02
  Filled 2022-12-10: qty 60, 30d supply, fill #0

## 2022-12-10 NOTE — Progress Notes (Signed)
GI CLINIC FOLLOW UP    INTERVAL HISTORY:   No referring provider defined for this encounter.    Chief Complaint   Patient presents with    Follow-up     Pt here for EGD and COLON f/u. No current complaints.       HISTORY OF PRESENT ILLNESS: [name] is a 47 y.o. 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 Ct showed partial SBO due to mesenteric mass. Mass was not previously seen on MRI abd 08/14/22. Tumor pathology significant for multiple well-differentiated NETs extending through the muscularis propria into subserosal tissue without penetration of the overlying serosa with local lymph node involvement. CEA, CA125, CA19-9, chromagranin A wnl 11/17/22. Chromagranin A wnl 58/15/24. Urine 5-IAA wnl 11/27/22. Lipase WNL.  She continues to be anemic without low iron, b12, folate. Heme/onc following [facility]- Dr Grace Bushy. PET ordered, scheduled for 6/10. No final staging at this time. Plans to start monthly sandostatin.    Today she reports that she is starting to have daily bowel movements. No cramping pain with bowel movements, but she does report that digestion feels "ground glass" regardless of what she eats.  Continues to report esophageal dysphagia with meat/bread if she does not chew well enough. No strictures/masses appreciated on EGD 5/7. S/p Nissen approx 25 years ago. Taking pepcid without heartburn. She does have chronic constipation, especially from cymbalta/wellbutrin. Seems to be improving since starting creon after elevated fecal elastase. Denies f/c, night sweats, unintentional weight loss, choking, odynophagia, n/v, black or bloody stools. No etoh or tobacco    Last abd imaging: CT abd/pelvis 11/26/22 - IMPRESSION:  1. Findings suggestive of a distal partial small bowel obstruction.  2. Persistent but stable appearing mesenteric mass  (3.8cm).  3. Mild ascites.  Last EGD: 11/18/22 - gastritis with bx pos for focal intestinal metaplasia of stomach, negative for H pylori  Last colonoscopy: 11/18/22 - 1 tubular adenoma, hemorrhoids, 3 yr recall    Past Medical,Family, and Social History reviewed and does contribute to the patient presentingcondition.    PAST MEDICAL HISTORY:  Past Medical History:   Diagnosis Date    Bursitis     Fibromyalgia     Scoliosis     Tendinitis        Past Surgical History:   Procedure Laterality Date    ABDOMINOPLASTY      COLONOSCOPY N/A 11/18/2022    COLONOSCOPY POLYPECTOMY SNARE BIOPSY performed by Marcia Brash, MD at [facility] [place] [facility]    ESOPHAGOGASTRIC FUNDOPLICATION  2000    GALLBLADDER SURGERY      LAPAROTOMY N/A 11/19/2022    LAPAROTOMY EXPLORATORY.  SMALL BOWEL RESECTION.  INCISIONAL BIOPSY OF MESENTERIC LYMPH NODE performed by Lucious Groves, MD at [facility] OR    TUBAL LIGATION      UPPER GASTROINTESTINAL ENDOSCOPY N/A 11/18/2022    ESOPHAGOGASTRODUODENOSCOPY BIOPSY performed by Marcia Brash, MD at [facility] [place] [facility]       CURRENT MEDICATIONS:    Current Outpatient Medications:     senna (SENOKOT) 8.6 MG tablet, Take 1 tablet by mouth 2 times daily, Disp: 60 tablet, Rfl: 11    lipase-protease-amylase (CREON) 36000-114000 units CPEP delayed release capsule, Take 1 capsule by mouth 3 times daily (with meals), Disp: 90 capsule, Rfl: 5    famotidine (PEPCID) 40 MG tablet, Take 1 tablet by mouth  every evening, Disp: 30 tablet, Rfl: 3    DULoxetine (CYMBALTA) 60 MG extended release capsule, take 1 capsule by mouth once daily, Disp: 90 capsule, Rfl: 1    albuterol sulfate HFA (PROVENTIL HFA) 108 (90 Base) MCG/ACT inhaler, Inhale 2 puffs into the lungs every 6 hours as needed for Wheezing or Shortness of Breath, Disp: 18 g, Rfl: 0    DULoxetine (CYMBALTA) 30 MG extended release capsule, Take 1 capsule by mouth daily, Disp: 90 capsule, Rfl: 1    buPROPion (WELLBUTRIN XL) 150 MG extended release tablet, Take 1 tablet by  mouth every morning, Disp: 90 tablet, Rfl: 1    tiZANidine (ZANAFLEX) 2 MG tablet, Take 1 tablet by mouth daily as needed (back muscle), Disp: 30 tablet, Rfl: 1    TRAZODONE HCL PO, Take by mouth, Disp: , Rfl:     ALLERGIES:   Allergies   Allergen Reactions    Oxycodone Anaphylaxis    Parsley Leaves Anaphylaxis, Hives, Itching and Shortness Of Breath    Percocet [Oxycodone-Acetaminophen] Shortness Of Breath    Codeine      Anaphylaxis     Morphine        FAMILY HISTORY:       Problem Relation Age of Onset    Diabetes Mother     Diabetes Maternal Grandmother        SOCIAL HISTORY:   Social History     Socioeconomic History    Marital status: Married     Spouse name: Not on file    Number of children: Not on file    Years of education: Not on file    Highest education level: Not on file   Occupational History    Not on file   Tobacco Use    Smoking status: Never    Smokeless tobacco: Never   Substance and Sexual Activity    Alcohol use: Not Currently     Comment: 3/4 times a week    Drug use: Never    Sexual activity: Not on file   Other Topics Concern    Not on file   Social History Narrative    Not on file     Social Determinants of Health     Financial Resource Strain: Low Risk  (12/01/2022)    Overall Financial Resource Strain (CARDIA)     Difficulty of Paying Living Expenses: Not hard at all   Food Insecurity: No Food Insecurity (12/01/2022)    Hunger Vital Sign     Worried About Running Out of Food in the Last Year: Never true     Ran Out of Food in the Last Year: Never true   Transportation Needs: No Transportation Needs (12/01/2022)    PRAPARE - Therapist, art (Medical): No     Lack of Transportation (Non-Medical): No   Physical Activity: Insufficiently Active (02/16/2022)    Exercise Vital Sign     Days of Exercise per Week: 4 days     Minutes of Exercise per Session: 30 min   Stress: Not on file   Social Connections: Not on file   Intimate Partner Violence: Not At Risk (02/16/2022)     Humiliation, Afraid, Rape, and Kick questionnaire     Fear of Current or Ex-Partner: No     Emotionally Abused: No     Physically Abused: No     Sexually Abused: No   Housing Stability: Low Risk  (11/26/2022)    Housing Stability  Vital Sign     Unable to Pay for Housing in the Last Year: No     Number of Places Lived in the Last Year: 1     Unstable Housing in the Last Year: No       REVIEW OF SYSTEMS: A 12-point review of systemswas obtained and pertinent positives and negatives were enumerated above in the history of present illness. All other reviewed systems / symptoms were negative.    Review of Systems   Constitutional:  Negative for activity change, appetite change, fatigue, fever and unexpected weight change.   HENT:  Positive for trouble swallowing. Negative for sore throat and voice change.    Respiratory:  Negative for cough, choking and wheezing.    Cardiovascular:  Negative for chest pain, palpitations and leg swelling.   Gastrointestinal:  Positive for abdominal pain and constipation. Negative for abdominal distention, anal bleeding, blood in stool, diarrhea, nausea, rectal pain and vomiting.   Genitourinary:  Negative for flank pain.   Skin:  Negative for color change and rash.   Allergic/Immunologic: Negative for food allergies and immunocompromised state.   Neurological:  Negative for dizziness, speech difficulty, weakness, light-headedness and headaches.   Hematological:  Does not bruise/bleed easily.   Psychiatric/Behavioral:  Negative for sleep disturbance.        LABORATORY DATA: Reviewed  Lab Results   Component Value Date    WBC 5.2 11/28/2022    RBC 4.39 11/28/2022    HGB 9.9 (L) 11/28/2022    HCT 31.2 (L) 11/28/2022    MCV 71.1 (L) 11/28/2022    PLT 330 11/28/2022    NA 140 11/28/2022    K 3.7 11/28/2022    CL 105 11/28/2022    CO2 25 11/28/2022    BUN 4 (L) 11/28/2022    CREATININE 0.7 11/28/2022    BILITOT 0.5 11/26/2022    ALKPHOS 130 (H) 11/26/2022    AST 164 (H) 11/26/2022    ALT 142 (H)  11/26/2022     No results found for: "ESR", "CRP"    Component 11/18/22 1522   Surgical Pathology Report Path Number: [id number]    -- Diagnosis --  A. STOMACH, BIOPSY:  Focal intestinal metaplasia in a background of  mild chronic inactive gastritis. Immunostain for H. pylori is negative  (with appropriate control).     B. SIGMOID COLON POLYP, BIOPSY:  Tubular adenoma.      Bettey Mare. Margarette Asal, M.D.  **Electronically Signed Out**        kmg2/11/20/2022            Component 11/19/22 0000   Surgical Pathology Report [id number]  [facility]  ANATOMIC PATHOLOGY  [address]  [place], [place] [address]  [phone]  Fax: [phone]  SURGICAL PATHOLOGY CONSULTATION    Patient Name: KYMESHA, ACHORN  MR#: [id number]  Specimen #[id number]    Procedures/Addenda  FLOW CYTOMETRY REPORT     Date Ordered:     11/20/2022     Status:  Signed Out       Date Complete:     11/20/2022     By: Molli Posey       Date Reported:     11/26/2022      INTERPRETATION  Mesenteric lymph node, flow cytometric immunophenotyping:  -Negative for B-cell monoclonality and T-cell aberrancy (in a  partially viable specimen).    RESULTS-COMMENTS  FLOW CYTOMETRIC ANALYSIS    Sample type: Mesenteric lymph node  Flow cytometric immunophenotyping analysis is performed on mesenteric  lymph node following rbc lysis procedure.  The cells are labeled by  direct ten color immunostaining procedure and analyzed on a Navios  flow cytometer.    Sample cytocentrifuge differential cell count       16.6% Lymph       9.0% Monos       8.3% Myeloid cells    Viability: 24.5%    Marker Panels:    B-cell Tube:  CD5, CD10, CD19, CD20, CD22, CD23, CD45, CD200, Kappa,  Lambda  T-cell Tube:  CD2, CD3, CD4, CD5, CD7, CD8, CD16, CD45, CD56, CD57    This leuk/lymph immunophenotyping test was developed and its  performance characteristics determined by [facility].  It has not been cleared or approved by the  [place]  Food and Drug Administration.  The FDA has determined that such  clearance or approval is not necessary.  This test is used for  clinical purposes.  It should not be regarded as investigational or  for research.  This laboratory is certified under the Clinical  Laboratory Improvement Amendments of 1988 (CLIA) as qualified to  perform high complexity clinical laboratory testing.       Molli Posey          Final Diagnosis  A.  MID SMALL BOWEL, RESECTION:  -MULTIPLE (NUMBER OF TUMORS: 13) WELL-DIFFERENTIATED NEUROENDOCRINE  TUMORS, WELL-DIFFERENTIATED, LARGEST SIZE 2.8 CM, EXTENDING THROUGH  MUSCULARIS PROPRIA INTO SUBSEROSAL TISSUE WITHOUT PENETRATION OF  OVERLYING SEROSA, MARGINS NEGATIVE.    -ONE (1) LYMPH NODE NEGATIVE FOR NEOPLASM.    B.  MESENTERIC LYMPH NODE, INCISIONAL BIOPSY:  -ONE (1) OF TWO (2) LYMPH NODES INVOLVED BY NEUROENDOCRINE TUMOR.    C.  MESENTERIC LYMPH NODE, INCISIONAL BIOPSY:  -FRAGMENTS OF LYMPH NODE INVOLVED BY NEUROENDOCRINE TUMOR.  SEE  COMMENT.    Diagnosis Comment  C.  It cannot be determined if the fragments of lymph node are  multiple individual lymph nodes or are one lymph node in multiple  pieces.      Azucena Fallen, M.D  **Electronically Signed Out**        des/11/24/2022        DIAGNOSTIC TESTING:   CT ABDOMEN PELVIS W IV CONTRAST Additional Contrast? None    Result Date: 11/26/2022  EXAMINATION: CT OF THE ABDOMEN AND PELVIS WITH CONTRAST 11/26/2022 4:05 pm TECHNIQUE: CT of the abdomen and pelvis was performed with the administration of intravenous contrast. Multiplanar reformatted images are provided for review. Automated exposure control, iterative reconstruction, and/or weight based adjustment of the mA/kV was utilized to reduce the radiation dose to as low as reasonably achievable. COMPARISON: 11/16/2022 HISTORY: ORDERING SYSTEM PROVIDED HISTORY: upper abd pain, nausea/vomiting; status post exploratory laparotomy with adhesion/mass removal on 11/20/2022 TECHNOLOGIST PROVIDED  HISTORY: upper abd pain, nausea/vomiting; status post exploratory laparotomy with adhesion/mass removal on 11/20/2022 Decision Support Exception - unselect if not a suspected or confirmed emergency medical condition->Emergency Medical Condition (MA) Reason for Exam: emesis, abdominal pain, post abdominal sx FINDINGS: Lower Chest: Lung bases are clear. Liver: Homogeneous attenuation without focal mass or intrahepatic bile duct dilatation Spleen: Unremarkable Adrenal glands: Unremarkable. Pancreas: Homogenous enhancement without focal mass or ductal dilatation Gallbladder/Biliary tree: Gallbladder is surgically absent.  No bile duct dilatation identified. Kidneys: No suspicious mass, calculus or hydronephrosis. Symmetric enhancement is noted.  Stable subcentimeter low-density left renal lesions consistent with cysts. Aorta: Normal caliber. Normal enhancement. Peritoneum/Retroperitoneum: The mesenteric mass measures 3.8  cm max, unchanged from the prior exam.  There is trace perihepatic ascites. Appendix: Not visualized GI/Bowel: There are fluid-filled dilated small bowel loops present.  There are decompressed loops in the right lower quadrant suggesting partial distal small-bowel obstruction.  There are surgical clips associated with small bowel loops in the low abdomen.  Fluid is noted in the right hemicolon. Pelvis: Urinary bladder is unremarkable in appearance. No pelvic adenopathy or mass identified. The uterus is grossly unremarkable.  Pelvic ascites is noted. Bones/Soft Tissues: There is soft tissue changes with edema and subcutaneous gas in the anterior abdominal wall as well as midline ventral skin staples consistent with recent abdominal surgery.     1. Findings suggestive of a distal partial small bowel obstruction. 2. Persistent but stable appearing mesenteric mass. 3. Mild ascites.     XR CHEST (2 VW)    Result Date: 11/26/2022  EXAMINATION: TWO XRAY VIEWS OF THE CHEST 11/26/2022 4:11 pm COMPARISON: 01/20/2021  HISTORY: ORDERING SYSTEM PROVIDED HISTORY: Chest Pain TECHNOLOGIST PROVIDED HISTORY: Chest Pain Reason for Exam: chest pain FINDINGS: The lungs are without acute focal process.  There is no effusion or pneumothorax. The cardiomediastinal silhouette is stable. The osseous structures are stable.     No acute process.     CT ABDOMEN PELVIS W IV CONTRAST Additional Contrast? None    Result Date: 11/16/2022  EXAMINATION: CT OF THE ABDOMEN AND PELVIS WITH CONTRAST 11/16/2022 12:58 pm TECHNIQUE: CT of the abdomen and pelvis was performed with the administration of intravenous contrast. Multiplanar reformatted images are provided for review. Automated exposure control, iterative reconstruction, and/or weight based adjustment of the mA/kV was utilized to reduce the radiation dose to as low as reasonably achievable. COMPARISON: None. HISTORY: ORDERING SYSTEM PROVIDED HISTORY: abd pain TECHNOLOGIST PROVIDED HISTORY: abd pain Decision Support Exception - unselect if not a suspected or confirmed emergency medical condition->Emergency Medical Condition (MA) FINDINGS: Lower Chest: No focal consolidation at the lung bases.  The heart is not enlarged.  No pericardial effusion. Organs: Liver: Normal appearance of the liver. Gallbladder: The gallbladder is surgically absent. Spleen: Unremarkable spleen. Pancreas: No peripancreatic inflammatory changes. Adrenal Glands: No focal adrenal abnormalities identified. Kidneys: No hydronephrosis.Nonobstructing 4-5 mm stone in the left kidney. GI/Bowel: Stomach: The stomach is nondistended. Small bowel: No evidence of small bowel obstruction. Colon: No significant pericolonic inflammatory changes. Appendix: Normal appearance of the appendix. Pelvis: No free fluid in the pelvis.  Unremarkable pelvic organs. Peritoneum/Retroperitoneum: Normal caliber abdominal aorta.  No retroperitoneal lymphadenopathy by CT criteria.  In the mesentery there are mild inflammatory changes about a nodular density which  measures 2.4 x 2.5 x 3.7 cm.  Small foci of low density centrally within this mass.  This does not confidently communicate with bowel loops. Bones/Soft Tissues: No acute findings within the soft tissues or osseous structures.     No acute intra-abdominal or pelvic abnormality Soft tissue mass in the ventral mesentery anterior to the aorta, not confidently communicating with small bowel.  Findings are concerning for neoplasm.  Enlarged lymph node with necrosis, peritoneal implant, carcinoid tumor or other neoplasm not excluded.  Recommend correlation with outpatient PET-CT.          PHYSICAL EXAMINATION: Vital signs reviewed per the nursing documentation.   BP 101/69   Pulse 71   Wt 95.3 kg (210 lb)   SpO2 100%   BMI 36.05 kg/m   Body mass index is 36.05 kg/m.   Physical Exam  Constitutional:  General: She is not in acute distress.     Appearance: She is not ill-appearing.   HENT:      Head: Normocephalic.      Mouth/Throat:      Lips: Pink.      Mouth: Mucous membranes are moist. No oral lesions.   Eyes:      General: No scleral icterus.     Pupils: Pupils are equal, round, and reactive to light.   Cardiovascular:      Rate and Rhythm: Normal rate and regular rhythm.      Pulses: No decreased pulses.      Heart sounds: No murmur heard.  Pulmonary:      Effort: Pulmonary effort is normal.      Breath sounds: Normal breath sounds.   Abdominal:      General: Bowel sounds are normal. There is no distension.      Palpations: Abdomen is soft. There is no fluid wave, hepatomegaly, splenomegaly or mass.      Tenderness: There is no abdominal tenderness. There is no guarding. Negative signs include Murphy's sign.   Musculoskeletal:         General: No swelling.      Cervical back: Neck supple.      Right lower leg: No edema.      Left lower leg: No edema.   Lymphadenopathy:      Upper Body:      Right upper body: No supraclavicular adenopathy.   Skin:     General: Skin is warm and dry.      Capillary Refill:  Capillary refill takes less than 2 seconds.      Coloration: Skin is not jaundiced.      Findings: No ecchymosis or rash.   Neurological:      Mental Status: She is alert and oriented to person, place, and time.   Psychiatric:         Attention and Perception: Attention normal.         Mood and Affect: Mood is not anxious or depressed.         Speech: Speech is not rapid and pressured.         Behavior: Behavior is not agitated. Behavior is cooperative.         Cognition and Memory: Cognition is not impaired. Memory is not impaired.           IMPRESSION: Ms. Karrer is a 47 y.o. female with    Diagnosis Orders   1. Neuroendocrine tumor  FL SMALL BOWEL FOLLOW THROUGH ONLY      2. Intraabdominal mass  FL SMALL BOWEL FOLLOW THROUGH ONLY      3. Esophageal dysphagia  FL ESOPHAGRAM    Motility study, esophageal      4. Anemia, unspecified type  CBC with Auto Differential      5. Ileus, postoperative (HCC)        6. Chronic constipation  senna (SENOKOT) 8.6 MG tablet    FL SMALL BOWEL FOLLOW THROUGH ONLY      7. Right upper quadrant abdominal pain  FL SMALL BOWEL FOLLOW THROUGH ONLY      8. Pancreatic insufficiency        9. Pancreatic divisum           Keep PET scan 12/13/22 for staging of mesenteric NET. 2 involved lymph nodes on bx. Negative 5-IAA and chromagranin A. Sandostatin per oncology. Keep all f/u with oncology - Dr Grace Bushy   Repeat  CBC to see if anemia is improving  Prn sennakot for constipation. SBFT to rule out small bowel involvement in mass causing the abd pain/constipation symptoms  Esophageal motility study and esophagram to workup up dysphagia, no abnormalities seen on EGD  Continue Creon for now  Repeat colonoscopy 3 yrs or sooner as needed  F/u 6-8 weeks      Medications and potential side effects were discussed with patient. GI rx refilled.   Diet/life style/natural hx /complication of the dx were all explained in detail   She should alert me if there are persistent or worsening symptoms, dysphagia,  weight loss or GI bleeding.     Past medical, past surgical, social history, psychiatric history, medications or allergies, all reviewed and updated. I personally reviewed all labs results and imaging studies of the abdomen available which were ordered by the primary care physician, and the other consultants. I did review all the pathology from the biopsies done on the previous endoscopies.    Thank you for allowing me to participate in the care of Ms. Fenstermacher. For any further questions please do not hesitate to contact me.    Case was discussed with Dr. Marcia Brash, MD, collaborating physician, as clinically appropriate.    Electronically signed by Lucile Shutters, PA on 12/10/2022 at 5:14 PM  [facility]  P: [phone]

## 2022-12-11 ENCOUNTER — Inpatient Hospital Stay
Admit: 2022-12-11 | Discharge: 2022-12-11 | Disposition: A | Discharge status: Home / Self Care | Payer: PRIVATE HEALTH INSURANCE | Attending: Emergency Medicine

## 2022-12-11 ENCOUNTER — Inpatient Hospital Stay: Payer: PRIVATE HEALTH INSURANCE | Primary: Nurse Practitioner

## 2022-12-11 ENCOUNTER — Inpatient Hospital Stay: Admit: 2022-12-11 | Payer: PRIVATE HEALTH INSURANCE | Primary: Nurse Practitioner

## 2022-12-11 ENCOUNTER — Emergency Department: Admit: 2022-12-11 | Payer: PRIVATE HEALTH INSURANCE | Primary: Nurse Practitioner

## 2022-12-11 DIAGNOSIS — K567 Ileus, unspecified: Secondary | ICD-10-CM

## 2022-12-11 DIAGNOSIS — D649 Anemia, unspecified: Secondary | ICD-10-CM

## 2022-12-11 DIAGNOSIS — R1319 Other dysphagia: Secondary | ICD-10-CM

## 2022-12-11 LAB — COMPREHENSIVE METABOLIC PANEL
ALT: 47 U/L — ABNORMAL HIGH (ref 5–33)
AST: 42 U/L — ABNORMAL HIGH (ref <32)
Albumin/Globulin Ratio: 1.6 (ref 1.0–2.5)
Albumin: 4.4 g/dL (ref 3.5–5.2)
Alkaline Phosphatase: 103 U/L (ref 35–104)
Anion Gap: 11 mmol/L (ref 9–17)
BUN: 9 mg/dL (ref 6–20)
CO2: 24 mmol/L (ref 20–31)
Calcium: 10.5 mg/dL — ABNORMAL HIGH (ref 8.6–10.4)
Chloride: 103 mmol/L (ref 98–107)
Creatinine: 0.6 mg/dL (ref 0.5–0.9)
Est, Glom Filt Rate: >90 mL/min/{1.73_m2} (ref >60)
Glucose: 96 mg/dL (ref 70–99)
Potassium: 4.2 mmol/L (ref 3.7–5.3)
Sodium: 138 mmol/L (ref 135–144)
Total Bilirubin: 0.3 mg/dL (ref 0.3–1.2)
Total Protein: 7.2 g/dL (ref 6.4–8.3)

## 2022-12-11 LAB — LACTIC ACID: Lactic Acid: 1.3 mmol/L (ref 0.5–2.2)

## 2022-12-11 LAB — CBC WITH AUTO DIFFERENTIAL
Basophils %: 1 % (ref 0–2)
Basophils %: 1 % (ref 0–2)
Basophils Absolute: 0.1 10*3/uL (ref 0.0–0.2)
Basophils Absolute: 0.06 10*3/uL (ref 0.00–0.20)
Eosinophils %: 1 % (ref 1–4)
Eosinophils %: 2 % (ref 1–4)
Eosinophils Absolute: 0.1 10*3/uL (ref 0.0–0.4)
Eosinophils Absolute: 0.15 10*3/uL (ref 0.00–0.44)
Hematocrit: 38.6 % (ref 36–46)
Hematocrit: 36.5 % (ref 36.3–47.1)
Hemoglobin: 12.1 g/dL (ref 12.0–16.0)
Hemoglobin: 11.2 g/dL — ABNORMAL LOW (ref 11.9–15.1)
Immature Granulocytes %: 0 %
Immature Granulocytes Absolute: <0.03 10*3/uL (ref 0.00–0.30)
Lymphocytes %: 37 % (ref 24–44)
Lymphocytes %: 35 % (ref 24–43)
Lymphocytes Absolute: 2.8 10*3/uL (ref 1.0–4.8)
Lymphocytes Absolute: 2.18 10*3/uL (ref 1.10–3.70)
MCH: 22.1 pg — ABNORMAL LOW (ref 26–34)
MCH: 22.6 pg — ABNORMAL LOW (ref 25.2–33.5)
MCHC: 31.2 g/dL (ref 31–37)
MCHC: 30.7 g/dL (ref 28.4–34.8)
MCV: 70.8 fL — ABNORMAL LOW (ref 80–100)
MCV: 73.6 fL — ABNORMAL LOW (ref 82.6–102.9)
MPV: 7.6 fL (ref 6.0–12.0)
MPV: 10.1 fL (ref 8.1–13.5)
Monocytes %: 10 % (ref 2–11)
Monocytes %: 10 % (ref 3–12)
Monocytes Absolute: 0.7 10*3/uL (ref 0.1–1.2)
Monocytes Absolute: 0.63 10*3/uL (ref 0.10–1.20)
NRBC Automated: 0 per 100 WBC
Neutrophils %: 51 % (ref 36–66)
Neutrophils %: 52 % (ref 36–65)
Neutrophils Absolute: 3.8 10*3/uL (ref 1.8–7.7)
Neutrophils Absolute: 3.14 10*3/uL (ref 1.50–8.10)
Platelets: 465 10*3/uL — ABNORMAL HIGH (ref 140–450)
Platelets: 400 10*3/uL (ref 138–453)
RBC: 5.45 m/uL — ABNORMAL HIGH (ref 4.0–5.2)
RBC: 4.96 m/uL (ref 3.95–5.11)
RDW: 14.7 % (ref 12.5–15.4)
RDW: 15.6 % — ABNORMAL HIGH (ref 11.8–14.4)
WBC: 7.4 10*3/uL (ref 3.5–11.0)
WBC: 6.2 10*3/uL (ref 3.5–11.3)

## 2022-12-11 LAB — LIPASE: Lipase: 40 U/L (ref 13–60)

## 2022-12-11 LAB — TROPONIN: Troponin, High Sensitivity: <6 ng/L (ref 0–14)

## 2022-12-11 MED ORDER — SODIUM CHLORIDE 0.9 % IV BOLUS
0.9 | Freq: Once | INTRAVENOUS | Status: AC
Start: 2022-12-11 — End: 2022-12-11
  Administered 2022-12-11: 19:00:00 1000 mL via INTRAVENOUS

## 2022-12-11 MED ORDER — DICYCLOMINE HCL 10 MG PO CAPS
10 | Freq: Once | ORAL | Status: AC
Start: 2022-12-11 — End: 2022-12-11
  Administered 2022-12-11: 22:00:00 20 mg via ORAL

## 2022-12-11 MED ORDER — SIMETHICONE 80 MG PO CHEW
80 | Freq: Once | ORAL | Status: AC
Start: 2022-12-11 — End: 2022-12-11
  Administered 2022-12-11: 19:00:00 80 mg via ORAL

## 2022-12-11 MED ORDER — IOPAMIDOL 76 % IV SOLN
76 | Freq: Once | INTRAVENOUS | Status: AC | PRN
Start: 2022-12-11 — End: 2022-12-11
  Administered 2022-12-11: 20:00:00 75 mL via INTRAVENOUS

## 2022-12-11 MED ORDER — BARIUM SULFATE 98 % PO SUSR
98 | Freq: Once | ORAL | Status: AC | PRN
Start: 2022-12-11 — End: 2022-12-11
  Administered 2022-12-11: 12:00:00 140 mL via ORAL

## 2022-12-11 MED ORDER — NORMAL SALINE FLUSH 0.9 % IV SOLN
0.9 | INTRAVENOUS | Status: DC | PRN
Start: 2022-12-11 — End: 2022-12-11
  Administered 2022-12-11: 20:00:00 10 mL via INTRAVENOUS

## 2022-12-11 MED ORDER — SODIUM CHLORIDE 0.9 % IV BOLUS
0.9 | Freq: Once | INTRAVENOUS | Status: DC
Start: 2022-12-11 — End: 2022-12-11

## 2022-12-11 MED ORDER — FENTANYL CITRATE (PF) 100 MCG/2ML IJ SOLN
100 | Freq: Once | INTRAMUSCULAR | Status: AC
Start: 2022-12-11 — End: 2022-12-11
  Administered 2022-12-11: 19:00:00 25 ug via INTRAVENOUS

## 2022-12-11 MED ORDER — DICYCLOMINE HCL 20 MG PO TABS
20 MG | ORAL_TABLET | Freq: Three times a day (TID) | ORAL | 0 refills | Status: DC | PRN
Start: 2022-12-11 — End: 2023-01-29

## 2022-12-11 MED FILL — DICYCLOMINE HCL 10 MG PO CAPS: 10 MG | ORAL | Qty: 2

## 2022-12-11 MED FILL — SIMETHICONE 80 MG PO CHEW: 80 MG | ORAL | Qty: 1

## 2022-12-11 MED FILL — FENTANYL CITRATE (PF) 100 MCG/2ML IJ SOLN: 100 MCG/2ML | INTRAMUSCULAR | Qty: 2

## 2022-12-11 NOTE — Discharge Instructions (Addendum)
Home.  Liquid diet today.  Gradually increasing as tolerated.  Please make sure you are passing gas.  Return to emergency department should experience any abdominal pain, nausea vomiting, worsening symptoms, not passing gas or any other concerns.  Bentyl to help with pain

## 2022-12-11 NOTE — ED Provider Notes (Signed)
[facility] EMERGENCY DEPARTMENT  EMERGENCY DEPARTMENT ENCOUNTER      Pt Name: Monica Wood  MRN: [id number]  Birthdate 1975/07/22  Date of evaluation: 12/11/2022  Provider: Tamala Julian, APRN - CNP  6:26 PM    CHIEF COMPLAINT       Chief Complaint   Patient presents with    Abdominal Pain     Pt arrives with right sided abdominal pain which pt states was noted after swallowing "chalky stuff" for a procedure today. Pt states she was recently diagnosed with a mass in abdomen which is cancerous. Pt states she had a procedure to attempt to remove the mass however the mass was inoperable . Pt states during the surgery she did have to have approx 12 in of intestines out. Pt states this pain is what initially brought her to the hospital prior to dianosis.          HISTORY OF PRESENT ILLNESS    Monica Wood is a 47 y.o. female who presents to the emergency department for evaluation of right upper quadrant abdominal pain.  Patient states the pain started around 10 AM.  She had a swallow study performed at 8:00.  She states that after she got done with the procedure she went home and started to have some pain, bloating, feeling stuck.  Recent diagnosis of neuroendocrine tumor, bowel resection.  They are unable to remove the mass because it is too close to nerves.  Patient is tearful.  She feels this pain is similar to her previous pain when he discovered the tumor      HPI    Nursing Notes were reviewed.    REVIEW OF SYSTEMS       Review of Systems   All other systems reviewed and are negative.      Except as noted above the remainder of the review of systems was reviewed and negative.       PAST MEDICAL HISTORY     Past Medical History:   Diagnosis Date    Bursitis     Fibromyalgia     Scoliosis     Tendinitis          SURGICAL HISTORY       Past Surgical History:   Procedure Laterality Date    ABDOMINOPLASTY      COLONOSCOPY N/A 11/18/2022    COLONOSCOPY POLYPECTOMY SNARE BIOPSY performed by Marcia Brash, MD  at [facility] OR    ESOPHAGOGASTRIC FUNDOPLICATION  2000    GALLBLADDER SURGERY      LAPAROTOMY N/A 11/19/2022    LAPAROTOMY EXPLORATORY.  SMALL BOWEL RESECTION.  INCISIONAL BIOPSY OF MESENTERIC LYMPH NODE performed by Lucious Groves, MD at [facility] OR    TUBAL LIGATION      UPPER GASTROINTESTINAL ENDOSCOPY N/A 11/18/2022    ESOPHAGOGASTRODUODENOSCOPY BIOPSY performed by Marcia Brash, MD at [facility] OR         CURRENT MEDICATIONS       Discharge Medication List as of 12/11/2022  5:23 PM        CONTINUE these medications which have NOT CHANGED    Details   senna (SENOKOT) 8.6 MG tablet Take 1 tablet by mouth 2 times daily, Disp-60 tablet, R-11Normal      lipase-protease-amylase (CREON) 36000-114000 units CPEP delayed release capsule Take 1 capsule by mouth 3 times daily (with meals), Disp-90 capsule, R-5Normal      famotidine (PEPCID) 40 MG tablet Take 1 tablet by mouth every  evening, Disp-30 tablet, R-3Normal      !! DULoxetine (CYMBALTA) 60 MG extended release capsule take 1 capsule by mouth once daily, Disp-90 capsule, R-1Total 90 mg a dayNormal      albuterol sulfate HFA (PROVENTIL HFA) 108 (90 Base) MCG/ACT inhaler Inhale 2 puffs into the lungs every 6 hours as needed for Wheezing or Shortness of Breath, Disp-18 g, R-0Normal      !! DULoxetine (CYMBALTA) 30 MG extended release capsule Take 1 capsule by mouth daily, Disp-90 capsule, R-1Total 90 mgNormal      buPROPion (WELLBUTRIN XL) 150 MG extended release tablet Take 1 tablet by mouth every morning, Disp-90 tablet, R-1Normal      tiZANidine (ZANAFLEX) 2 MG tablet Take 1 tablet by mouth daily as needed (back muscle), Disp-30 tablet, R-1Normal      TRAZODONE HCL PO Take by mouthHistorical Med       !! - Potential duplicate medications found. Please discuss with provider.          ALLERGIES     Oxycodone, Parsley leaves, Percocet [oxycodone-acetaminophen], Codeine, and Morphine    FAMILY HISTORY       Family History   Problem Relation Age of Onset     Diabetes Mother     Diabetes Maternal Grandmother           SOCIAL HISTORY       Social History     Socioeconomic History    Marital status: Married     Spouse name: None    Number of children: None    Years of education: None    Highest education level: None   Tobacco Use    Smoking status: Never    Smokeless tobacco: Never   Substance and Sexual Activity    Alcohol use: Not Currently     Comment: 3/4 times a week    Drug use: Never     Social Determinants of Health     Financial Resource Strain: Low Risk  (12/01/2022)    Overall Financial Resource Strain (CARDIA)     Difficulty of Paying Living Expenses: Not hard at all   Food Insecurity: No Food Insecurity (12/01/2022)    Hunger Vital Sign     Worried About Running Out of Food in the Last Year: Never true     Ran Out of Food in the Last Year: Never true   Transportation Needs: No Transportation Needs (12/01/2022)    PRAPARE - Therapist, art (Medical): No     Lack of Transportation (Non-Medical): No   Physical Activity: Insufficiently Active (02/16/2022)    Exercise Vital Sign     Days of Exercise per Week: 4 days     Minutes of Exercise per Session: 30 min   Intimate Partner Violence: Not At Risk (02/16/2022)    Humiliation, Afraid, Rape, and Kick questionnaire     Fear of Current or Ex-Partner: No     Emotionally Abused: No     Physically Abused: No     Sexually Abused: No   Housing Stability: Low Risk  (11/26/2022)    Housing Stability Vital Sign     Unable to Pay for Housing in the Last Year: No     Number of Places Lived in the Last Year: 1     Unstable Housing in the Last Year: No       SCREENINGS         Glasgow Coma Scale  Eye Opening: Spontaneous  Best  Verbal Response: Oriented  Best Motor Response: Obeys commands  Glasgow Coma Scale Score: 15                     CIWA Assessment  BP: 106/62  Pulse: 80                 PHYSICAL EXAM       ED Triage Vitals [12/11/22 1253]   BP Temp Temp src Pulse Respirations SpO2 Height Weight - Scale    120/74 97.7 F (36.5 C) -- 80 14 100 % 1.645 m (5' 4.75") 93.9 kg (207 lb)       Physical Exam  Vitals and nursing note reviewed.   Constitutional:       General: She is not in acute distress.     Appearance: Normal appearance. She is obese. She is not toxic-appearing.   HENT:      Head: Normocephalic and atraumatic.      Right Ear: External ear normal.      Left Ear: External ear normal.      Nose: Nose normal.      Mouth/Throat:      Mouth: Mucous membranes are moist.      Pharynx: Oropharynx is clear.   Eyes:      Extraocular Movements: Extraocular movements intact.      Conjunctiva/sclera: Conjunctivae normal.   Cardiovascular:      Rate and Rhythm: Normal rate and regular rhythm.      Pulses: Normal pulses.      Heart sounds: Normal heart sounds. No murmur heard.  Pulmonary:      Effort: Pulmonary effort is normal. No respiratory distress.      Breath sounds: Normal breath sounds.   Abdominal:      General: Abdomen is flat. There is no distension.      Palpations: Abdomen is soft. There is no mass (no pulsitile mass).      Tenderness: There is abdominal tenderness (mild ruq).   Musculoskeletal:         General: No swelling or tenderness. Normal range of motion.      Cervical back: Normal range of motion and neck supple. No rigidity or tenderness.   Skin:     General: Skin is warm and dry.      Capillary Refill: Capillary refill takes less than 2 seconds.   Neurological:      General: No focal deficit present.      Mental Status: She is alert and oriented to person, place, and time.   Psychiatric:         Mood and Affect: Mood normal.         Behavior: Behavior normal.         DIAGNOSTIC RESULTS     EKG: All EKG's are interpreted by the Emergency Department Physician who either signs or Co-signs this chart in the absence of a cardiologist.        RADIOLOGY:   Non-plain film images such as CT, Ultrasound and MRI are read by the radiologist. Plain radiographic images are visualized and preliminarily interpreted  by the emergency physician with the below findings:        Interpretation per the Radiologist below, if available at the time of this note:    CT ABDOMEN PELVIS W IV CONTRAST Additional Contrast? None   Final Result   Suboptimal evaluation of the abdomen secondary to residual contrast from an   esophagram in the small bowel.  There  are findings related to ileus versus   partial small bowel obstruction.  There is improvement of the small bowel   obstruction when compared to the prior exam.      Stable enlarged lymph node or mass with adjacent fluid and air in the central   mesentery.      Probable 2.7 cm right-sided ovarian cyst.      Interval increase in size of a small collection involving the anterior soft   tissues of the abdomen. This likely represents a postoperative seroma or   hematoma.      Additional findings noted above.               ED BEDSIDE ULTRASOUND:   Performed by ED Physician - none    LABS:  Labs Reviewed   CBC WITH AUTO DIFFERENTIAL - Abnormal; Notable for the following components:       Result Value    RBC 5.45 (*)     MCV 70.8 (*)     MCH 22.1 (*)     Platelets 465 (*)     All other components within normal limits   COMPREHENSIVE METABOLIC PANEL - Abnormal; Notable for the following components:    Calcium 10.5 (*)     ALT 47 (*)     AST 42 (*)     All other components within normal limits   LIPASE   LACTIC ACID   TROPONIN       All other labs were within normal range or not returned as of this dictation.    EMERGENCY DEPARTMENT COURSE and DIFFERENTIAL DIAGNOSIS/MDM:   Vitals:    Vitals:    12/11/22 1253 12/11/22 1427 12/11/22 1659   BP: 120/74 119/81 106/62   Pulse: 80     Resp: 14     Temp: 97.7 F (36.5 C)     SpO2: 100% 100% 100%   Weight: 93.9 kg (207 lb)     Height: 1.645 m (5' 4.75")             Medical Decision Making  Amount and/or Complexity of Data Reviewed  Labs: ordered.  Radiology: ordered.  ECG/medicine tests: ordered.    Risk  OTC drugs.  Prescription drug  management.      Patient presents evaluation of right upper quadrant abdominal pain after having a swallow study performed this morning.  History of recent diagnosis of neuroendocrine tumor with a small bowel resection.     Patient is found to have a continued small bowel obstruction however this is improving.  Her labs are unremarkable.  Her pain is improving.  I discussed the case with on-call surgeon Dr. Haywood Pao who states it is important to treat the patient and CT reviewed.  He feels that if she is tolerating fluids, if she is able to pass gas and have bowel movements, then she does not need admission to the hospital.  He feels she can follow-up in the outpatient setting.  I have given her Bentyl to help with the cramping pain.  I suspect that her pain is mostly due to the ingestion of barium swallow study and some decreased gastric emptying time.  We did try simethicone which helped her pain minimally.  We then tried Bentyl.  Her husband is at the bedside and they both feel comfortable and ready for discharge home.  She is tearful, tired, overwhelmed by her recent diagnosis and I feel this may be contributory to her concern for pain.  We discussed what  to watch out for, when to return, fevers, nausea vomiting, diarrhea, inability to pass gas, worsening pain, symptoms that do not resolve in the next 12 to 24 hours or any other worsening symptoms or concerns      REASSESSMENT     ED Course as of 12/14/22 1826   Thu Dec 11, 2022   1720 I spoke with Dr. Haywood Pao on-call for Dr. Cliffton Asters who states that the patient can go home if she is not vomiting and she is able to pass gas and follow-up with Dr. Cliffton Asters in the office.  Patient feels comfortable this [MR]      ED Course User Index  [MR] Tamala Julian, APRN - CNP         CRITICAL CARE TIME     CONSULTS:  None    PROCEDURES:  Unless otherwise noted below, none     Procedures        FINAL IMPRESSION      1. Ileus [facility])          DISPOSITION/PLAN   DISPOSITION Decision To  Discharge 12/11/2022 05:34:11 PM      PATIENT REFERRED TO:  Levander Campion, APRN - CNP  [address]  [address]  [place] [address]  [phone]          Lucious Groves, MD  [address]  [place] [address]  [phone]          [facility] Emergency Department  [phone]  [place] [address]  [phone]    If symptoms worsen      DISCHARGE MEDICATIONS:  Discharge Medication List as of 12/11/2022  5:23 PM        START taking these medications    Details   dicyclomine (BENTYL) 20 MG tablet Take 1 tablet by mouth every 8 hours as needed (abdominal cramping), Disp-30 tablet, R-0Normal           Controlled Substances Monitoring:          No data to display                (Please note that portions of this note were completed with a voice recognition program.  Efforts were made to edit the dictations but occasionally words are mis-transcribed.)    Tamala Julian, APRN - CNP (electronically signed)           Tamala Julian, APRN - CNP  12/14/22 1828

## 2022-12-11 NOTE — ED Notes (Signed)
Resting quietly feeling better - continues to have some burping/gas but pain improved.

## 2022-12-11 NOTE — ED Notes (Addendum)
Ambulatory to restroom gait steady - voiding clear yellow urine for specimen 300 ml. Specimen to lab. Having burping at this time.

## 2022-12-11 NOTE — ED Provider Notes (Signed)
Emergency Department     Faculty Attestation    I performed a history and physical examination of the patient and discussed management with the mid level provider. I reviewed the mid level provider's note and agree with the documented findings and plan of care. Any areas of disagreement are noted on the chart. I was personally present for the key portions of any procedures. I have documented in the chart those procedures where I was not present during the key portions. I have reviewed the emergency nurses triage note. I agree with the chief complaint, past medical history, past surgical history, allergies, medications, social and family history as documented unless otherwise noted below. Documentation of the HPI, Physical Exam and Medical Decision Making performed by medical students or scribes is based on my personal performance of the HPI, PE and MDM. For Physician Assistant/ Nurse Practitioner cases/documentation I have personally evaluated this patient and have completed at least one if not all key elements of the E/M (history, physical exam, and MDM). Additional findings are as noted.      Primary Care Physician:  Levander Campion, APRN - CNP    CHIEF COMPLAINT       Chief Complaint   Patient presents with    Abdominal Pain     Pt arrives with right sided abdominal pain which pt states was noted after swallowing "chalky stuff" for a procedure today. Pt states she was recently diagnosed with a mass in abdomen which is cancerous. Pt states she had a procedure to attempt to remove the mass however the mass was inoperable . Pt states during the surgery she did have to have approx 12 in of intestines out. Pt states this pain is what initially brought her to the hospital prior to dianosis.        RECENT VITALS:   Temp: 97.7 F (36.5 C),  Pulse: 80, Respirations: 14, BP: 106/62    LABS:  Labs Reviewed   CBC WITH AUTO DIFFERENTIAL - Abnormal; Notable for the following components:       Result Value    RBC 5.45 (*)      MCV 70.8 (*)     MCH 22.1 (*)     Platelets 465 (*)     All other components within normal limits   COMPREHENSIVE METABOLIC PANEL - Abnormal; Notable for the following components:    Calcium 10.5 (*)     ALT 47 (*)     AST 42 (*)     All other components within normal limits   LIPASE   LACTIC ACID   TROPONIN         PERTINENT ATTENDING PHYSICIAN COMMENTS:    47 year old female presented here with complaints of abdominal pain particularly in the right side of the abdomen.  She underwent a swallow study today.  She has had numerous recent complications owing to a neuroendocrine tumor.  She has undergone excision, small bowel resection, Nissen fundoplication, cholecystectomy.  Imaging of the abdomen and pelvis today showing small bowel obstruction.  She looks well overall, has been afebrile. Will plan for NG tube placement and consultation with general surgery.          Theda Belfast, MD  12/12/22 Earle Gell

## 2022-12-11 NOTE — Telephone Encounter (Signed)
Writer called pt to notify that SBFT order was added yesterday afternoon to r/o small bowel stricture from the mesenteric mass due to persistent abd discomfort.     She states that the radiology dept did the [facility] Esophagram for dysphagia earlier today. She reports excruciating epigastric/RUQ pain following the PO barium contrast. SBFT was not run to visualize area of concern in the small bowel. Discussed elevated concern for stricture vs. Obstruction. Patient is still able to pass gas at this time. Referred pt to ED for further testing/symptom mgmt.

## 2022-12-12 ENCOUNTER — Encounter: Payer: PRIVATE HEALTH INSURANCE | Primary: Nurse Practitioner

## 2022-12-12 LAB — EKG 12-LEAD
Atrial Rate: 54 {beats}/min
P Axis: 53 degrees
P-R Interval: 158 ms
Q-T Interval: 438 ms
QRS Duration: 100 ms
QTc Calculation (Bazett): 415 ms
R Axis: 12 degrees
T Axis: 37 degrees
Ventricular Rate: 54 {beats}/min

## 2022-12-12 NOTE — Care Coordination-Inpatient (Signed)
Ambulatory Care Coordination Note  12/12/2022    Patient Current Location:  Home: [address]  [place] [address]     ACM contacted the patient by telephone. Verified name and DOB with patient as identifiers. Provided introduction to self, and explanation of the ACM role.     Called patient as she went to the ED yesterday for abdominal pain. She said the pain medication helped they gave her in the ED, this morning she seems to be feeling better she said. She has an appt this afternoon at 3:15 to have her staples removed. Pt has a scrip for bentyl to take as needed. She denies fever. Will forward onto ACM following patient for care mtg.     Offered patient enrollment in the Remote Patient Monitoring (RPM) program for in-home monitoring: Patient is not eligible for RPM program because: insurance coverage.        Future Appointments   Date Time Provider Department Center   12/17/2022 11:00 AM Prudence Davidson, MD [facility] CANCER [facility]   12/22/2022  8:00 AM [place] [facility] INJECTION ROOM [facility]   12/22/2022  9:00 AM [facility] PET/CT ROOM [facility]   01/28/2023 12:15 PM Levander Campion, APRN - CNP [facility]   03/09/2023  3:00 PM Daboul, Isam, MD [facility]        Silvana Newness BSN, RN- [facility] CCM  Associate Care Manager  [phone]  [email or web address]

## 2022-12-15 LAB — CHROMOGRANIN A: Chromogranin A: 123 ng/mL (ref 0–187)

## 2022-12-16 NOTE — Care Coordination-Inpatient (Signed)
Care Transitions Note    Follow Up Call      Patient Current Location:  Home: [address]  [place] [address]    Care Transition Nurse contacted the patient by telephone. Verified name and DOB as identifiers.    Additional needs identified to be addressed with provider   No needs identified                 Method of communication with provider: none.    Care Summary Note: patient states the adjustments with her digestive system has been difficult. States she is feeling much better now since she was in the ED last week. States she followed up with surgeon last Friday and sutures were removed. States incision is looking good but still healing. She denies pain at this time. States she is doing well with bm and passing gas. taking stool softener. Hasn't had to use Bentyl since ED visit. States she is doing fine with eating and just making sure she chews up her food really well and drinking plenty of fluids when eating. Patient states she was able to schedule an appointment with Dr. Cloretta Ned with oncology because [facility] was out of network with insurance. Patient is scheduled with Dr. Cloretta Ned on 12/17/22. She will have PET scan on 12/22/22. Patient states she has no questions or concerns at this time for me.    Plan of care updates since last contact:  Review of patient management of conditions/medications: patient states she has started taking stool softeners every day.       Advance Care Planning:   Does patient have an Advance Directive: deferred at this time, will discuss on future follow up. .    Medication Review:  No changes since last call.     Remote Patient Monitoring:  Offered patient enrollment in the Remote Patient Monitoring (RPM) program for in-home monitoring: Patient is not eligible for RPM program because: insurance coverage.    Assessments:  Care Transitions Subsequent and Final Call    Schedule Follow Up Appointment with PCP: Completed  Subsequent and Final Calls  Do you have any ongoing symptoms?:  No  Have your medications changed?: No  Do you have any questions related to your medications?: No  Do you currently have any active services?: No  Do you have any needs or concerns that I can assist you with?: No  Identified Barriers: None  Care Transitions Interventions  Other Interventions:              Follow Up Appointment:   Reviewed upcoming appointment(s). and TOC appointment attended as scheduled   Future Appointments         Provider Specialty Dept Phone    12/17/2022 11:00 AM Prudence Davidson, MD Oncology [phone]    12/22/2022 8:00 AM (Arrive by 7:30 AM) [facility] NM INJECTION ROOM Radiology [phone]    12/22/2022 9:00 AM (Arrive by 8:30 AM) Eula Flax CT RM 2; [facility] PET/CT ROOM Radiology [phone]    01/28/2023 12:15 PM Levander Campion, APRN - CNP Primary Care [phone]    03/09/2023 3:00 PM Marcia Brash, MD Gastroenterology [phone]            Care Transition Nurse provided contact information.  Plan for follow-up call in 6-10 days based on severity of symptoms and risk factors.  Plan for next call: symptom management-status of abdominal pain, status of bm  follow-up appointment-Dr. Cloretta Ned 12/17/22  Results from PET scan 12/22/22 if available      Orma Render,  RN

## 2022-12-17 ENCOUNTER — Institutional Professional Consult (permissible substitution)
Admit: 2022-12-17 | Discharge: 2022-12-17 | Payer: PRIVATE HEALTH INSURANCE | Attending: Internal Medicine | Primary: Nurse Practitioner

## 2022-12-17 VITALS — BP 110/63 | HR 68 | Temp 96.20000°F | Resp 14 | Wt 205.2 lb

## 2022-12-17 DIAGNOSIS — D3A019 Benign carcinoid tumor of the small intestine, unspecified portion: Principal | ICD-10-CM

## 2022-12-17 NOTE — Patient Instructions (Signed)
Rv with day 1 of octreotide   Labs on rv   Refer to ENT

## 2022-12-17 NOTE — Progress Notes (Signed)
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  Patient complains of having abdominal pain .  Presented to the ER underwent imaging which showed soft tissue mass in the ventral mesentery anterior to the aorta concerning for neoplasm.  Patient underwent EGD and colonoscopy by GI which showed a 1 cm polyp in the colon but no other significant abnormality.  Subsequently patient was taken to the OR for exploratory laparotomy small bowel resection and lymph node dissection.  Pathology from the surgery showed multiple well-differentiated neuroendocrine tumors largest measuring 2.8 cm in the mid small bowel resection.  There was also metastasis noted to the lymph nodes.  Patient had a grade 1 disease.  Ki-67 was 1-2%.  Pathological stage was T3 N1.  Patient is recovering from the surgery.  She has a PET scan scheduled next week.  Presents to the clinic to establish care and for further workup.  Patient also noted to have microcytic anemia.  Iron studies done earlier show adequate iron stores.  Patient also complains of  hoarseness of voice which has been going on for a while.    Past Medical History:   Past Medical History:   Diagnosis Date    Bursitis     Fibromyalgia     Scoliosis     Tendinitis        Past Surgical History:  Past Surgical History:   Procedure Laterality Date    ABDOMINOPLASTY      COLONOSCOPY N/A 11/18/2022    COLONOSCOPY POLYPECTOMY SNARE BIOPSY performed by Marcia Brash, MD at [facility] OR    ESOPHAGOGASTRIC FUNDOPLICATION  2000    GALLBLADDER SURGERY      LAPAROTOMY N/A 11/19/2022    LAPAROTOMY EXPLORATORY.  SMALL BOWEL RESECTION.  INCISIONAL BIOPSY OF MESENTERIC LYMPH NODE performed by Lucious Groves, MD at [facility] OR    TUBAL LIGATION      UPPER GASTROINTESTINAL ENDOSCOPY N/A 11/18/2022  ESOPHAGOGASTRODUODENOSCOPY BIOPSY performed by Marcia Brash, MD at [facility] OR     Patient Family Social History:  Family History   Problem Relation Age of Onset    Diabetes Mother     Diabetes Maternal Grandmother      Social History     Tobacco Use    Smoking status: Never    Smokeless tobacco: Never   Substance Use Topics    Alcohol use: Not Currently     Comment: 3/4 times a week    Drug use: Never     Current Medications:  Current Outpatient Medications   Medication Sig Dispense Refill    dicyclomine (BENTYL) 20 MG tablet Take 1 tablet by mouth every 8 hours as needed (abdominal cramping) 30 tablet 0    senna (SENOKOT) 8.6 MG tablet Take 1 tablet by mouth 2 times daily 60 tablet 11    lipase-protease-amylase (CREON) 36000-114000 units CPEP delayed release capsule Take 1 capsule by mouth 3 times daily (with meals) 90 capsule 5    famotidine (PEPCID) 40 MG tablet Take 1 tablet by mouth every evening 30 tablet 3    DULoxetine (CYMBALTA) 60 MG extended release capsule take 1 capsule by mouth once daily 90 capsule 1    albuterol sulfate HFA (PROVENTIL HFA) 108 (90 Base) MCG/ACT inhaler Inhale 2 puffs into the lungs every 6 hours as needed for Wheezing or Shortness of Breath 18 g 0    DULoxetine (CYMBALTA) 30  MG extended release capsule Take 1 capsule by mouth daily 90 capsule 1    buPROPion (WELLBUTRIN XL) 150 MG extended release tablet Take 1 tablet by mouth every morning 90 tablet 1    tiZANidine (ZANAFLEX) 2 MG tablet Take 1 tablet by mouth daily as needed (back muscle) 30 tablet 1    TRAZODONE HCL PO Take by mouth       No current facility-administered medications for this visit.     Allergies:   Oxycodone, Parsley leaves, Percocet [oxycodone-acetaminophen], Codeine, and Morphine    Review of Systems:    Constitutional: No fever or chills. No night sweats, no weight loss   Eyes: No eye discharge, double vision, or eye pain   HEENT: negative for sore mouth, sore throat, hoarseness and voice change   Respiratory: negative for cough , sputum, dyspnea, wheezing, hemoptysis, chest pain   Cardiovascular: negative for chest pain, dyspnea, palpitations, orthopnea, PND   Gastrointestinal: negative for nausea, vomiting, diarrhea, constipation, abdominal pain, Dysphagia, hematemesis and hematochezia.  Positive for abdominal discomfort  Genitourinary: negative for frequency, dysuria, nocturia, urinary incontinence, and hematuria   Integument: negative for rash, skin lesions, bruises.   Hematologic/Lymphatic: negative for easy bruising, bleeding, lymphadenopathy, or petechiae   Endocrine: negative for heat or cold intolerance,weight changes, change in bowel habits and hair loss   Musculoskeletal: negative for myalgias, arthralgias, pain, joint swelling,and bone pain   Neurological: negative for headaches, dizziness, seizures, weakness, numbness    Physical Exam:  Vitals: BP 110/63   Pulse 68   Temp (!) 96.2 F (35.7 C)   Resp 14   Wt 93.1 kg (205 lb 3.2 oz)   SpO2 100%   BMI 34.41 kg/m   General appearance - well appearing, no in pain or distress  Mental status - AAO X3  Eyes - pupils equal and reactive, extraocular eye movements intact  Mouth - mucous membranes moist, pharynx normal without lesions  Neck - supple, no  significant adenopathy  Lymphatics - no palpable lymphadenopathy, no  hepatosplenomegaly  Chest - clear to auscultation, no wheezes, rales or rhonchi, symmetric air entry  Heart - normal rate, regular rhythm, normal S1, S2, no murmurs  Abdomen - soft, nontender, nondistended, no masses or organomegaly.  Positive healing surgical incision site  Neurological - alert, oriented, normal speech, no focal findings or movement disorder noted  Extremities - peripheral pulses normal, no pedal edema, no clubbing or cyanosis  Skin - normal coloration and turgor, no rashes, no suspicious skin lesions noted       DATA:  Results for orders placed or performed during the hospital encounter of 12/11/22   CBC with Auto Differential   Result Value Ref Range    WBC 6.2 3.5 - 11.3 k/uL    RBC 4.96 3.95 - 5.11 m/uL    Hemoglobin 11.2 (L) 11.9 - 15.1 g/dL    Hematocrit 16.1 09.6 - 47.1 %    MCV 73.6 (L) 82.6 - 102.9 fL    MCH 22.6 (L) 25.2 - 33.5 pg    MCHC 30.7 28.4 - 34.8 g/dL    RDW 04.5 (H) 40.9 - 14.4 %    Platelets 400 138 - 453 k/uL    MPV 10.1 8.1 - 13.5 fL    NRBC Automated 0.0 0.0 per 100 WBC    Neutrophils % 52 36 - 65 %    Lymphocytes % 35 24 - 43 %    Monocytes % 10 3 - 12 %    Eosinophils % 2 1 - 4 %    Basophils % 1 0 - 2 %    Immature Granulocytes % 0 0 %    Neutrophils Absolute 3.14 1.50 - 8.10 k/uL    Lymphocytes Absolute 2.18 1.10 - 3.70 k/uL    Monocytes Absolute 0.63 0.10 - 1.20 k/uL    Eosinophils Absolute 0.15 0.00 - 0.44 k/uL    Basophils Absolute 0.06 0.00 - 0.20 k/uL    Immature Granulocytes Absolute <0.03 0.00 - 0.30 k/uL    RBC Morphology ANISOCYTOSIS PRESENT MICROCYTOSIS PRESENT    Chromogranin A   Result Value Ref Range    Chromogranin A 123 0 - 187 ng/mL     CT ABDOMEN PELVIS W IV CONTRAST Additional Contrast? None    Result Date: 12/11/2022  EXAMINATION: CT OF THE ABDOMEN AND PELVIS WITH CONTRAST 12/11/2022 2:42 pm TECHNIQUE: CT of the abdomen and pelvis was performed with the administration of intravenous  contrast. Multiplanar reformatted images are provided for review. Automated exposure control, iterative reconstruction, and/or weight based adjustment of the mA/kV was utilized to reduce the radiation dose to as low as reasonably achievable. COMPARISON: Nov 26, 2022 HISTORY: ORDERING SYSTEM PROVIDED HISTORY: abdominal pain TECHNOLOGIST PROVIDED HISTORY: abdominal pain Decision Support Exception - unselect if not a suspected or confirmed emergency medical condition->Emergency Medical Condition (MA) Reason for Exam: RUQ abd pain FINDINGS: Lower Chest: Imaged lung bases are clear.  Image cardiac chambers are unremarkable in appearance.  No pericardial effusion or pericardial thickening. Organs: Image quality is degraded secondary to residual contrast in the bowel.  Gallbladder is absent.  Liver, spleen, adrenal glands, and pancreas are normal in appearance. Contrast in a lower pole left renal calix versus 4 mm renal calculus.  No hydronephrosis.  No definite left renal lesion involving the imaged left kidney.  No right renal calculus, hydronephrosis, or renal lesion involving the imaged right kidney. GI/Bowel: Multiple dilated small bowel loops within the abdomen.  Contrast is seen in the ileum.  Small volume fecal  residuals in the imaged colon. Pelvis: Small volume free fluid in the pelvis.  Probable 2.7 cm right-sided ovarian cyst.  Bladder is unremarkable in appearance.  Fat containing left inguinal hernia.  Inguinal adenopathy or mass. Peritoneum/Retroperitoneum: No retroperitoneal mass or adenopathy.  Large lymph node or mass measures approximately 2.3 x 2.0 cm in the central mesentery.  Adjacent fluid and air (series 2, image 91). Bones/Soft Tissues: Interval increase in size of a small collection involving the anterior soft tissues of the abdomen (series 2 image 115) which measures approximately 3.1 x 2.8 cm.  No associated air within this collection. Postsurgical change involving the anterior soft tissues of  the abdomen. Spondylosis of the lumbar spine.  No acute osseous abnormality identified.     Suboptimal evaluation of the abdomen secondary to residual contrast from an esophagram in the small bowel.  There are findings related to ileus versus partial small bowel obstruction.  There is improvement of the small bowel obstruction when compared to the prior exam. Stable enlarged lymph node or mass with adjacent fluid and air in the central mesentery. Probable 2.7 cm right-sided ovarian cyst. Interval increase in size of a small collection involving the anterior soft tissues of the abdomen. This likely represents a postoperative seroma or hematoma. Additional findings noted above.     FL ESOPHAGRAM    Result Date: 12/11/2022  EXAMINATION: DOUBLE CONTRAST ESOPHAGRAM 12/11/2022 8:00 am TECHNIQUE: Double contrast esophagram was performed with barium and air contrast. FLUOROSCOPY DOSE AND TYPE: Fluoro time: 1.2 minutes DAP: 214.196 dGycm2 COMPARISON: CT chest from 01/20/2021 HISTORY: ORDERING SYSTEM PROVIDED HISTORY: Esophageal dysphagia TECHNOLOGIST PROVIDED HISTORY: dysphagia Should a barium tablet be used, if available?->Yes Is the patient pregnant?->No 47 year old female with esophageal dysphagia FINDINGS: Fluoroscopic scout images of the chest and abdomen were obtained.  Moderate stool burden.  Nonspecific bowel gas pattern.  Mild levoscoliosis of the thoracolumbar spine. The esophagus demonstrates normal course, contour, and caliber.  Mucosal pattern of the esophagus is grossly unremarkable. No abnormal intrinsic filling defect, abnormal extrinsic compression, segmental stricture or focal stenosis is identified. The esophagus demonstrates normal peristalsis.  There is patency of the GE junction with adequate distention. No sizable hiatal hernia is identified.  No significant gastroesophageal reflux is seen.     Overall, unremarkable double-contrast esophagram.     CT ABDOMEN PELVIS W IV CONTRAST Additional Contrast?  None    Result Date: 11/26/2022  EXAMINATION: CT OF THE ABDOMEN AND PELVIS WITH CONTRAST 11/26/2022 4:05 pm TECHNIQUE: CT of the abdomen and pelvis was performed with the administration of intravenous contrast. Multiplanar reformatted images are provided for review. Automated exposure control, iterative reconstruction, and/or weight based adjustment of the mA/kV was utilized to reduce the radiation dose to as low as reasonably achievable. COMPARISON: 11/16/2022 HISTORY: ORDERING SYSTEM PROVIDED HISTORY: upper abd pain, nausea/vomiting; status post exploratory laparotomy with adhesion/mass removal on 11/20/2022 TECHNOLOGIST PROVIDED HISTORY: upper abd pain, nausea/vomiting; status post exploratory laparotomy with adhesion/mass removal on 11/20/2022 Decision Support Exception - unselect if not a suspected or confirmed emergency medical condition->Emergency Medical Condition (MA) Reason for Exam: emesis, abdominal pain, post abdominal sx FINDINGS: Lower Chest: Lung bases are clear. Liver: Homogeneous attenuation without focal mass or intrahepatic bile duct dilatation Spleen: Unremarkable Adrenal glands: Unremarkable. Pancreas: Homogenous enhancement without focal mass or ductal dilatation Gallbladder/Biliary tree: Gallbladder is surgically absent.  No bile duct dilatation identified. Kidneys: No suspicious mass, calculus or hydronephrosis. Symmetric enhancement is noted.  Stable subcentimeter low-density left renal lesions consistent with cysts.  Aorta: Normal caliber. Normal enhancement. Peritoneum/Retroperitoneum: The mesenteric mass measures 3.8 cm max, unchanged from the prior exam.  There is trace perihepatic ascites. Appendix: Not visualized GI/Bowel: There are fluid-filled dilated small bowel loops present.  There are decompressed loops in the right lower quadrant suggesting partial distal small-bowel obstruction.  There are surgical clips associated with small bowel loops in the low abdomen.  Fluid is noted in the  right hemicolon. Pelvis: Urinary bladder is unremarkable in appearance. No pelvic adenopathy or mass identified. The uterus is grossly unremarkable.  Pelvic ascites is noted. Bones/Soft Tissues: There is soft tissue changes with edema and subcutaneous gas in the anterior abdominal wall as well as midline ventral skin staples consistent with recent abdominal surgery.     1. Findings suggestive of a distal partial small bowel obstruction. 2. Persistent but stable appearing mesenteric mass. 3. Mild ascites.     XR CHEST (2 VW)    Result Date: 11/26/2022  EXAMINATION: TWO XRAY VIEWS OF THE CHEST 11/26/2022 4:11 pm COMPARISON: 01/20/2021 HISTORY: ORDERING SYSTEM PROVIDED HISTORY: Chest Pain TECHNOLOGIST PROVIDED HISTORY: Chest Pain Reason for Exam: chest pain FINDINGS: The lungs are without acute focal process.  There is no effusion or pneumothorax. The cardiomediastinal silhouette is stable. The osseous structures are stable.     No acute process.        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  Reviewed previous medical records  Reviewed treatment history  Reviewed results of EGD colonoscopy and also report  Discussed results of path report and explained to the patient in layman's terms.  Discussed natural history of well-differentiated carcinoid tumor.  Follow-up on results of dotatate PET to evaluate extent of disease  Planning to start patient on octreotide  Reviewed goals of care and expectations.  Patient has been deemed not surgical candidate.  Without surgery patient disease cannot be cured but patient disease very much treatable.  Refer patient to ENT for assessment of hoarseness of voice.  NCCN guidelines were reviewed and discussed with the patient.  Patient noted to  have microcytic anemia with adequate iron stores.  She may have underlying hemoglobinopathy.  Will obtain hemoglobin electrophoresis  The diagnosis and care plan were discussed with the patient in detail. I discussed the natural history of the disease, prognosis, risks and goals of therapy and answered all the patients questions to the best of my ability.  Patient expressed understanding and was in agreement.      Prudence Davidson, MD    I spent a total of 60 minutes on the date of the service which included preparing to see the patient, face-to-face patient care, completing clinical documentation, obtaining and/or reviewing separately obtained history, performing a medically appropriate examination, counseling and educating the patient/family/caregiver, ordering medications, tests, or procedures, communicating with other HCPs (not separately reported), independently interpreting results (not separately reported), communicating results to the patient/family/caregiver and care coordination (not separately reported).      This note is created with the assistance of a speech recognition program.  While intending to generate a document that actually reflects the content of the visit, the document can still have some errors including those of syntax and sound a like substitutions which may escape proof reading.  It such instances, actual meaning can be extrapolated  by contextual diversion.

## 2022-12-17 NOTE — Telephone Encounter (Addendum)
Name: Monica Wood  DOB: 10/30/1975  MRN: [id number]    Oncology Navigation- Initial Note:    Intake-  Contact Type: Medical Oncology    Diagnosis: GI- malignant    Home Disposition: Lives with other who is able to assist    Patient needs and barriers to care: Nio Barriers Identified     Referral Source: Health Professional    Interventions-   General Interventions: none     Education/Screenings:  no     Currently on HRT: no     Referrals: none     Biopsy site status: colone          Continuum of Care: Diagnosis/Active Treatment    Notes: Writer met with pt in person during her consult with Dr. Cloretta Ned. Pt lives at home with spouse. No barriers to care identified today.    Pt had laparotomy but was unable to have mass removed due to location per Dr. Cliffton Asters. Dr. Cloretta Ned went over dx and tx plan in great detail with pt. Pt verbalizes understanding and appreciative. Will continue to follow.    Electronically signed by Lenard Simmer, RN on 12/17/2022 at 11:35 AM

## 2022-12-17 NOTE — Telephone Encounter (Signed)
Instructions   from Dr. Prudence Davidson, MD    Rv with day 1 of octreotide   Labs on rv   Refer to ENT       ENT referral sent electronically. Patient given number to office to call if she does not hear from them by Friday.   RV to be scheduled once tx is approved and scheduled.

## 2022-12-18 ENCOUNTER — Inpatient Hospital Stay: Payer: PRIVATE HEALTH INSURANCE | Primary: Nurse Practitioner

## 2022-12-18 DIAGNOSIS — C7A011 Malignant carcinoid tumor of the jejunum: Secondary | ICD-10-CM

## 2022-12-18 NOTE — Telephone Encounter (Signed)
Patient has a referral in the chart for a new patient Carcinoid tumor of small intestine, unspecified location, unspecified whether malignant. Is the diagnosis one that the clinic will see the patient for? Please contact the patient to advise. Thank you

## 2022-12-19 MED FILL — FAMOTIDINE 40MG TABS: 40 40 MG | 30 days supply | Qty: 30 | Fill #1 | Status: AC

## 2022-12-19 NOTE — Telephone Encounter (Signed)
Per Dr. Nolene Ebbs office note - patient is being referred for hoarseness of voice. This writer attempted to contact patient to schedule an appointment. VM left requesting clinic back.

## 2022-12-20 LAB — 5 HIAA, URINE
5-HIAA, Ur: 12 mg/L
5-HIAA, Urine: 7 mg/d (ref 0–15)
5-HIAA,Urine: 4 mg/gCR (ref 0–14)
Creatinine Urine /24 Hr: 1626 mg/d — ABNORMAL HIGH (ref 700–1600)
Creatinine Urine /volume: 278 mg/dL
Hours Collected: 24
Urine Volume: 585

## 2022-12-22 ENCOUNTER — Inpatient Hospital Stay: Admit: 2022-12-22 | Payer: PRIVATE HEALTH INSURANCE | Attending: Hematology & Oncology | Primary: Nurse Practitioner

## 2022-12-22 ENCOUNTER — Ambulatory Visit: Payer: PRIVATE HEALTH INSURANCE | Primary: Nurse Practitioner

## 2022-12-22 ENCOUNTER — Encounter

## 2022-12-22 DIAGNOSIS — C7A011 Malignant carcinoid tumor of the jejunum: Secondary | ICD-10-CM

## 2022-12-22 LAB — CULTURE, FUNGUS
Culture: NO GROWTH
Direct Exam: NONE SEEN

## 2022-12-22 MED ORDER — NORMAL SALINE FLUSH 0.9 % IV SOLN
0.9 | Freq: Once | INTRAVENOUS | Status: AC
Start: 2022-12-22 — End: 2022-12-22
  Administered 2022-12-22: 12:00:00 10 mL via INTRAVENOUS

## 2022-12-22 MED ORDER — COPPER CU 64 DOTATATE 1 MCI/ML IV SOLN
1 | Freq: Once | INTRAVENOUS | Status: AC | PRN
Start: 2022-12-22 — End: 2022-12-22
  Administered 2022-12-22: 12:00:00 4.457 via INTRAVENOUS

## 2022-12-22 MED ORDER — CREON 36000-114000 UNITS PO CPEP
36000-114000 units | ORAL_CAPSULE | Freq: Three times a day (TID) | ORAL | 5 refills | Status: DC
Start: 2022-12-22 — End: 2023-08-28
  Filled 2022-12-26: qty 100, 33d supply, fill #0

## 2022-12-22 NOTE — Telephone Encounter (Signed)
Patient returned the call and will like a call back. Please used the number listed in the chart. Thank you

## 2022-12-22 NOTE — Telephone Encounter (Signed)
Left voicemail to schedule sooner appointment and to call back at [phone] option 3. No additional needs.

## 2022-12-22 NOTE — Telephone Encounter (Signed)
Appt rescheduled. No additional needs.

## 2022-12-22 NOTE — Telephone Encounter (Signed)
Per protocol - pt to be seen within 4 weeks due to dx. LVM for patient to contact clinic back to get appt rescheduled to sooner date.

## 2022-12-23 ENCOUNTER — Encounter

## 2022-12-25 ENCOUNTER — Encounter
Admit: 2022-12-25 | Discharge: 2022-12-25 | Payer: PRIVATE HEALTH INSURANCE | Attending: Physician Assistant | Primary: Nurse Practitioner

## 2022-12-25 ENCOUNTER — Ambulatory Visit: Admit: 2022-12-25 | Discharge: 2022-12-25 | Payer: PRIVATE HEALTH INSURANCE | Primary: Nurse Practitioner

## 2022-12-25 DIAGNOSIS — M7061 Trochanteric bursitis, right hip: Secondary | ICD-10-CM

## 2022-12-25 MED ORDER — BUPIVACAINE HCL 0.5 % IJ SOLN
0.5 | Freq: Once | INTRAMUSCULAR | Status: AC
Start: 2022-12-25 — End: 2022-12-25
  Administered 2022-12-25: 18:00:00 150 mL via INTRADERMAL

## 2022-12-25 MED ORDER — BETAMETHASONE SOD PHOS & ACET 6 (3-3) MG/ML IJ SUSP
6 | Freq: Once | INTRAMUSCULAR | Status: AC
Start: 2022-12-25 — End: 2022-12-25
  Administered 2022-12-25: 18:00:00 12 mg via INTRA_ARTICULAR

## 2022-12-25 MED ORDER — BUPIVACAINE HCL 0.5 % IJ SOLN
0.5 | Freq: Once | INTRAMUSCULAR | Status: AC
Start: 2022-12-25 — End: 2022-12-25
  Administered 2022-12-25: 18:00:00 10 mL via INTRA_ARTICULAR

## 2022-12-25 MED ORDER — LIDOCAINE HCL 1 % IJ SOLN
1 | Freq: Once | INTRAMUSCULAR | Status: AC
Start: 2022-12-25 — End: 2022-12-25
  Administered 2022-12-25: 18:00:00 2 mL via INTRA_ARTICULAR

## 2022-12-25 NOTE — Progress Notes (Signed)
[facility]                Donnie Coffin, [place]            [address], [address]               [place], [place] [address]           Dept Phone: [phone]           Dept Fax:  [phone]              [address]                       [address]           [place], [place] [address]          Dept Phone: [phone]           Dept Fax:  [phone]      Chief Compliant:  Chief Complaint   Patient presents with    Hip Pain     bilateral        History of Present Illness:  This is a 47 y.o. female who presents to the clinic today for evaluation of had concerns including Hip Pain (bilateral).     Monica Wood is a 48 year old female who returns today for reevaluation of bilateral hip pain.  Patient was last seen on 06/30/2021 found to have evidence of trochanteric bursitis and underwent trochanteric bursal injections.  She states these injections provided significant relief of her pain.  Patient feels she like she has been dealing with some pain in other areas so feels like this was secondary to that however the last couple weeks her pain in the hips have become especially more bothersome.     She denies any new injury or trauma no hip joint warmth, redness, fever or chills.        Past History:    Current Outpatient Medications:     lipase-protease-amylase (CREON) 36000-114000 units CPEP delayed release capsule, Take 1 capsule by mouth 3 times daily (with meals), Disp: 90 capsule, Rfl: 5    dicyclomine (BENTYL) 20 MG tablet, Take 1 tablet by mouth every 8 hours as needed (abdominal cramping), Disp: 30 tablet, Rfl: 0    senna (SENOKOT) 8.6 MG tablet, Take 1 tablet by mouth 2 times daily, Disp: 60 tablet, Rfl: 11    famotidine (PEPCID) 40 MG tablet, Take 1 tablet by mouth every evening, Disp: 30 tablet, Rfl: 3    DULoxetine (CYMBALTA) 60 MG extended release capsule, take 1 capsule by mouth once daily, Disp: 90 capsule, Rfl: 1    albuterol sulfate HFA (PROVENTIL HFA) 108 (90  Base) MCG/ACT inhaler, Inhale 2 puffs into the lungs every 6 hours as needed for Wheezing or Shortness of Breath, Disp: 18 g, Rfl: 0    DULoxetine (CYMBALTA) 30 MG extended release capsule, Take 1 capsule by mouth daily, Disp: 90 capsule, Rfl: 1    buPROPion (WELLBUTRIN XL) 150 MG extended release tablet, Take 1 tablet by mouth every morning, Disp: 90 tablet, Rfl: 1    tiZANidine (ZANAFLEX) 2 MG tablet, Take 1 tablet by mouth daily as needed (back muscle), Disp: 30 tablet, Rfl: 1    TRAZODONE HCL PO, Take by mouth, Disp: , Rfl:   Allergies   Allergen Reactions    Oxycodone Anaphylaxis    Parsley Leaves Anaphylaxis, Hives,  Itching and Shortness Of Breath    Percocet [Oxycodone-Acetaminophen] Shortness Of Breath    Codeine      Anaphylaxis     Morphine      Social History     Socioeconomic History    Marital status: Married     Spouse name: Not on file    Number of children: Not on file    Years of education: Not on file    Highest education level: Not on file   Occupational History    Not on file   Tobacco Use    Smoking status: Never    Smokeless tobacco: Never   Substance and Sexual Activity    Alcohol use: Not Currently     Comment: 3/4 times a week    Drug use: Never    Sexual activity: Not on file   Other Topics Concern    Not on file   Social History Narrative    Not on file     Social Determinants of Health     Financial Resource Strain: Low Risk  (12/01/2022)    Overall Financial Resource Strain (CARDIA)     Difficulty of Paying Living Expenses: Not hard at all   Food Insecurity: No Food Insecurity (12/01/2022)    Hunger Vital Sign     Worried About Running Out of Food in the Last Year: Never true     Ran Out of Food in the Last Year: Never true   Transportation Needs: No Transportation Needs (12/01/2022)    PRAPARE - Therapist, art (Medical): No     Lack of Transportation (Non-Medical): No   Physical Activity: Insufficiently Active (02/16/2022)    Exercise Vital Sign     Days of  Exercise per Week: 4 days     Minutes of Exercise per Session: 30 min   Stress: Not on file   Social Connections: Not on file   Intimate Partner Violence: Not At Risk (02/16/2022)    Humiliation, Afraid, Rape, and Kick questionnaire     Fear of Current or Ex-Partner: No     Emotionally Abused: No     Physically Abused: No     Sexually Abused: No   Housing Stability: Low Risk  (11/26/2022)    Housing Stability Vital Sign     Unable to Pay for Housing in the Last Year: No     Number of Places Lived in the Last Year: 1     Unstable Housing in the Last Year: No     Past Medical History:   Diagnosis Date    Bursitis     Fibromyalgia     Scoliosis     Tendinitis      Past Surgical History:   Procedure Laterality Date    ABDOMINOPLASTY      COLONOSCOPY N/A 11/18/2022    COLONOSCOPY POLYPECTOMY SNARE BIOPSY performed by Marcia Brash, MD at [facility] [place] [facility]    ESOPHAGOGASTRIC FUNDOPLICATION  2000    GALLBLADDER SURGERY      LAPAROTOMY N/A 11/19/2022    LAPAROTOMY EXPLORATORY.  SMALL BOWEL RESECTION.  INCISIONAL BIOPSY OF MESENTERIC LYMPH NODE performed by Lucious Groves, MD at [facility] OR    TUBAL LIGATION      UPPER GASTROINTESTINAL ENDOSCOPY N/A 11/18/2022    ESOPHAGOGASTRODUODENOSCOPY BIOPSY performed by Marcia Brash, MD at [facility] [place]     Family History   Problem Relation Age of Onset    Diabetes Mother  Diabetes Maternal Grandmother         Review of Systems   Constitutional: Negative for fever, chills, sweats.   Eyes: Negative for changes in vision, or pain.   HENT: Negative for ear ache, epistaxis, or sore throat.  Respiratory/Cardio: Negative for Chest pain, palpitations, SOB, or cough.  Gastrointestinal: Negative for abdominal pain, N/V/D.   Genitourinary: Negative for dysuria, frequency, urgency, or hematuria.   Neurological: Negative for headache, numbness, or weakness.   Integumentary: Negative for rash, itching, laceration, or abrasion.   Musculoskeletal: Positive for Hip Pain (bilateral)       Physical  Exam:  Constitutional: Patient is oriented to person, place, and time. Patient appears well-developed and well nourished.   HENT: Negative otherwise noted  Head: Normocephalic and Atraumatic  Nose: Normal  Eyes: Conjunctivae and EOM are normal  Neck: Normal range of motion Neck supple.    Respiratory/Cardio: Effort normal. No respiratory distress.  Musculoskeletal:    bilateral Hip    Tenderness: Severe tenderness over the bilateral greater trochanter.  Mild tenderness over the proximal IT band.  No tenderness to the anterior posterior hip       Range of Motion:      Extension: 10     Flexion: 120     Internal Rotation: 30     External Rotation: 40     Abduction: 40     Adduction:  30       Muscle Strength      Abduction: 5/5     Adduction: 5/5     Flexion: 5/5         Gait: Antalgic   Faber Test: Negative   Ober Test: Positive       Neurological: Patient is alert and oriented to person, place, and time. Normal strenght. No sensory deficit.  Skin: Skin is warm and dry  Psychiatric: Behavior is normal. Thought content normal.  Nursing note and vitals reviewed.     Labs and Imaging:       X-rays taken in clinic today and preliminarily reviewed by me 12/25/22:  AP pelvis and lateral view of both hips demonstrate some very early degenerative changes/spur formation with overall maintenance of from acetabular joint space.  There is no evidence of acute fracture, obvious AVN or femoral head flattening.       Orders Placed This Encounter   Procedures    XR HIP BILATERAL W AP PELVIS (2 VIEWS)     Standing Status:   Future     Number of Occurrences:   1     Standing Expiration Date:   12/23/2023       Assessment and Plan:  1. Trochanteric bursitis of both hips          PLAN:  Monica Wood is a 47 y.o. old female with bilateral hip pain that is consistent with bilateral trochanteric bursitis  We discussed treatment options available to her, including nonoperative and operative intervention.  At this time I believe she will  benefit from conservative management.  Consequently, an extensive home exercise program was provided.  We also discussed use the possibility of a cortisone injection and at this time she has elected to proceed with as described below    I'll see her back my clinic in 4 months for reevaluation but she was encouraged to return or call earlier with questions and/or concerns.    Procedure: bilateral trochanteric bursal celestone injection  An informed verbal consent for the procedure was obtained and  risks including, but not limited to: allergy to medications, injection, bleeding, stiffness of joint, recurrence of symptoms, loss of function, swelling, drainage, irrigation, need for surgery and pseudo-septic inflammation, were explained to the patient. Also, discussed was the potential for further injections, irrigation and debridement and surgery. Alternate means of treatment have also been discussed with the patient.          Following an appropriate discussion with the patient regarding the risks and benefits of the procedure she consented to proceed. her bilateral trochanteric bursa was prepped using betadine solution. Using aseptic technique and through a lateral approach, her bilateral trochanteric bursa was injected superficially with 4 cc of 1% lidocaine without epinephrine and subsequently with 2 cc of 6 mg/mL Celestone into the bursal space.A band aid was applied to the injection site. she tolerated the injection with no immediate adverse reactions.            Electronically signed by Donnie Coffin, PA on 12/25/22 at 1:19 PM EDT        Please note that this chart was generated using voice recognition Dragon dictation software.  Although every effort was made to ensure the accuracy of this automated transcription, some errors in transcription may have occurred.

## 2022-12-29 ENCOUNTER — Inpatient Hospital Stay: Admit: 2022-12-29 | Discharge: 2022-12-29 | Payer: PRIVATE HEALTH INSURANCE | Primary: Nurse Practitioner

## 2022-12-29 DIAGNOSIS — M542 Cervicalgia: Secondary | ICD-10-CM

## 2022-12-29 NOTE — Other (Signed)
[]  [facility] &  Therapy  [address]  [phone] []  [facility]  Outpatient Rehabilitation &  Therapy  [address]  P: [phone]  F: [phone] [x]  [facility]  Outpatient Rehabilitation &  Therapy  [address] [place]: [phone]  F: [phone] []  [facility] - [facility]  Outpatient Rehabilitation &  Therapy  [address] []  [facility]  Outpatient Rehabilitation &  Therapy  [address] [place] [address]   [place]: [phone]  F: [phone]  []  [facility]  Outpatient Rehabilitation &  Therapy  [address]  P: [phone]  F: [phone] []  [facility] - [facility]  Outpatient Rehabilitation &  Therapy  [address]  P: [phone]  F: [phone] []  [facility] - [facility] &  Therapy  [address]  [place]: [phone]  F: [phone] []  [facility]  Outpatient Rehabilitation &  Therapy  [address]  [place]: [phone]  F: [phone]  []  [facility] &  Therapy  [address]  [place]: [phone]  F: [phone]     Physical Therapy Daily Treatment Note    Date:  12/29/2022  Patient Name:  Monica Wood    DOB:  [birth year]  MRN: [id number]  Physician: Levander Campion, APRN - CNP  Insurance: [facility] [employer] after 30 visits  Medical Diagnosis: Cervicalgia                    Rehab Codes: M54.2,  R29.3, M99.01     Onset Date: 08/05/22 script                                        Next Dr.'s appt.: prn, oncology July 04/2023  Visit# / total visits: 9/12     Cancels/No Shows: 3/1    Subjective:     Pain:  [x]  Yes  []  No Location: neck, head, R UE  Pain Rating: (0-10 scale) 2/10   Pain altered Tx:  [x]  No  []  Yes  Action:  Comments: Patient returns to PT after ~2  months 2 being hospitalized. C/o cervical stiffness and tightness notes not as bad as before, currently left< right.  Pain in shoulders and neck, no recent HA's. Has been doing good but just RTW last week. Bending arm or leg still causes discomfort. Feels DN has been helpful and wishes to cont. Had Cortizone injection in hips last week. Notes she had an episode of her legs giving out and N&T last week, slid down bed to floor, no injury. Has not been very compliant  w/ HEP.    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 Exploratory laparotomy small bowel resection and lymph node dissection. T3N1. Plan for octreotide injections 1x monthy July?  Manual:  IDN cerv-thoracic paraspinals, UT, lev, suboccipitals and homeostatics in prone. Cerv UT stretch MET. manual traction, occipital release. Bilateral UPA cervical and upper thoracic    Exercises:  Exercise Reps/ Time Weight/ Level  Comments   Cerv rot x       UT, lev stretch x                 Post shld rolls x       Scap retraction x            UE tband:   *    Pec stretch  *        Other:     Treatment Charges: Mins Units   []   Modalities     [x]   Ther Exercise 6 1   [x]   Manual Therapy 35 2   []   Ther Activities     []   Neuro Re-ed     []   Vasocompression     []  Gait     []   Other     Total Billable time         Assessment: [x]  Progressing toward goals. Tightness and tension noted throughout neck and shoulders but decr from prev visits. Cont 'd w/ DN and manual as detailed above. Pt w/ good tol and no adverse reaction. Briefly rev HEP and encouraged daily as well as postural awareness esp w/ work duties. Will cont as tol. 1x/bi weekly a week.     []  No change.     [x]  Other: Dry needling performed in conjunction with manual therapy to facilitate circulation, promote tissue mobility and reduce pain. No charge submitted for the time  the needle was inserted.   [x]  Patient would continue to benefit from skilled physical therapy services in order to: Decrease tightness in cervical paraspinals, suboccipitals am pecs and strengthen scapular stabilizers in order to improve overall postural stabilization and decrease strain on head and neck.       STG: (to be met in 6 treatments)  ? Pain: Decrease pain levels to 2/10 at worst in neck and shoulder- MET  ? ROM: Optimize AROM of cervical spine and bilat UE's  ? Strength: Bilat shoulders grossly 5/5, demonstrate improvements in scapular/postural stability   ? Function: Pt to report improved sleep and incr ease w/ ADL's, lifting, reading and driving  Patient to be independent with home exercise program as demonstrated by performance with correct form without cues.     LTG: (to be met in 12 treatments)  Pt to report decr frequency and intensity of HA's and UE N&T  2.  Improve score on assessment tools from 30% impaired to 25% impaired, demonstrating an improvement in overall function     Pt goals: no more pain/numbness also, strengthen grip in hand      Pt. Education:  [x]  Yes  []  No  [x]  Reviewed Prior HEP/Ed  Method of Education: [x]  Verbal  [x]  Demo  []  Written  Comprehension of Education:  [x]  Verbalizes understanding.  []  Demonstrates understanding.  []  Needs review.  []  Demonstrates/verbalizes HEP/Ed previously given.     Plan: [x]  Continue current frequency toward long and short term goals.    [x]  Specific Instructions for subsequent treatments: see above      Time In: 5:00 pm            Time Out: 5:50 pm    Electronically signed by:  Lorrin Jackson, PT

## 2022-12-30 ENCOUNTER — Ambulatory Visit: Payer: PRIVATE HEALTH INSURANCE | Primary: Nurse Practitioner

## 2022-12-30 NOTE — Care Coordination-Inpatient (Unsigned)
Ambulatory Care Coordination Note     12/30/2022 4:56 PM     Patient Current Location:  [place]     ACM contacted the patient by telephone. Verified name and DOB with patient as identifiers.         ACM: Orma Render, RN     Challenges to be reviewed by the provider   Additional needs identified to be addressed with provider No  none               Method of communication with provider: none.    Care Summary Note: ***    Offered patient enrollment in the Remote Patient Monitoring (RPM) program for in-home monitoring: Patient is not eligible for RPM program because: insurance coverage.     Assessments Completed:   No changes since last call    Medications Reviewed:   Patient denies any changes with medications and reports taking all medications as prescribed.    Advance Care Planning:   {ACP Care Coordination:63022}     Care Planning:   {ACM Care ZOXW:96045}    PCP/Specialist follow up:   Future Appointments         Provider Specialty Dept Phone    01/06/2023 2:40 PM Volney American, MD Otolaryngology [phone]    01/12/2023 5:00 PM Lorrin Jackson, [place] [facility] [phone]    01/20/2023 9:00 AM Lucile Shutters, [place] [facility] [phone]    01/21/2023 11:45 AM Prudence Davidson, MD Oncology [phone]    01/21/2023 1:30 PM [facility] MED ONC CHAIR 05 Infusion Therapy [phone]    01/26/2023 5:00 PM Lorrin Jackson, PT Physical Therapy [phone]    01/28/2023 12:15 PM Levander Campion, APRN - CNP Primary Care [phone]    02/18/2023 1:30 PM [facility] MED ONC CHAIR 05 Infusion Therapy [phone]            Follow Up:   {ACM follow RJ:18841}

## 2023-01-05 LAB — CULTURE WITH SMEAR, ACID FAST BACILLIUS
Culture: NO GROWTH
Direct Exam: NONE SEEN

## 2023-01-06 ENCOUNTER — Ambulatory Visit
Admit: 2023-01-06 | Discharge: 2023-01-06 | Payer: PRIVATE HEALTH INSURANCE | Attending: Student in an Organized Health Care Education/Training Program | Primary: Nurse Practitioner

## 2023-01-06 DIAGNOSIS — R490 Dysphonia: Secondary | ICD-10-CM

## 2023-01-06 MED ORDER — OXYMETAZOLINE HCL 0.05 % NA SOLN
0.05 | Freq: Once | NASAL | Status: AC
Start: 2023-01-06 — End: 2023-01-06

## 2023-01-06 MED ORDER — LIDOCAINE HCL 4 % EX SOLN
4 | Freq: Once | CUTANEOUS | Status: AC
Start: 2023-01-06 — End: 2023-01-06

## 2023-01-06 NOTE — Progress Notes (Signed)
Monica Wood is here today, they are being seen for   Chief Complaint   Patient presents with    New Patient     Hoarseness        Immunizations: up to date and documented   Stated as up to date, no records available.     Any vision or hearing concerns:YES/NO: no    Any concerns for safety at home:  YES/NO: no    Tobacco/vape use: YES/NO: no    Alcohol use: YES/NO: no    Hospitalizations: see EPIC documentation    Prior Surgeries: see EPIC documentation    Medications & Herbal Supplements: see EPIC documentation    ENT Bleeding Risk Assessment Questionnaire    1:  History of Epistaxis?    0- No history of nosebleeds    2: Excessive bleeding after tooth extraction?     0- No excessive bleeding after tooth extraction    3: Issues with post-surgical bleeding (including circumcision)?    0- No postoperative bleeding issues    5: Family history of known bleeding disorder in parent or sibling?     0- No    6: Do you typically have more than 5 bruises present at any given time?   0- No    7: If menstruating, is there a history of heavy bleeding (menorrhagia), changing pads more often than every 2 hours, clots/ flooding present, and/ or menses lasting more than 7 days?    0- No or not applicable        SCORE of 3 or GREATER, RECOMMEND HEMATOLOGY CONSULTATION PRE-OP, CBC and vonWILLEBRAND PANEL WITH PLATELET FUNCTION ANALYSIS.    New patient coming in with c/o intermittent hoarseness for multiple years. Patient explains that if she talks in a higher pitched voice then her voice doesn't sound hoarse but in her normal register it is hard for her to talk. 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

## 2023-01-06 NOTE — Progress Notes (Signed)
[facility] @ [address] [place]  Consult Visit Note    REASON FOR Referral:  Hoarseness  Referring Provider: Levander Campion, APRN - CNP  [address]  [place],  [place] [address]       HPI:  Monica Wood is 47 y.o. female presenting to ENT clinic for further evaluation of intermittent hoarseness for multiple years. Patient explains that if she talks in a higher pitched voice then she does not have any issues, but if she speaks in her normal pitch it is hard for her to talk.  States that it sounds gravelly, rough, and not as strong.  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  Although this has been a problem for multiple years, she decided to pursue evaluation given newly diagnosed cancer.  Denies dysphagia, odynophagia, stridor, etc.    Inhalational Smoke: Never    ROS:  10 point ROS performed, pertinent findings documented in HPI.    Current Outpatient Medications on File Prior to Visit   Medication Sig Dispense Refill    lipase-protease-amylase (CREON) 36000-114000 units CPEP delayed release capsule Take 1 capsule by mouth 3 times daily (with meals) 90 capsule 5    senna (SENOKOT) 8.6 MG tablet Take 1 tablet by mouth 2 times daily 60 tablet 11    famotidine (PEPCID) 40 MG tablet Take 1 tablet by mouth every evening 30 tablet 3    DULoxetine (CYMBALTA) 60 MG extended release capsule take 1 capsule by mouth once daily 90 capsule 1    DULoxetine (CYMBALTA) 30 MG extended release capsule Take 1 capsule by mouth daily 90 capsule 1    buPROPion (WELLBUTRIN XL) 150 MG extended release tablet Take 1 tablet by mouth every morning 90 tablet 1    tiZANidine (ZANAFLEX) 2 MG tablet Take 1 tablet by mouth daily as needed (back muscle) 30 tablet 1    dicyclomine (BENTYL) 20 MG tablet Take 1 tablet by mouth every 8 hours as needed (abdominal cramping) 30 tablet 0    albuterol sulfate  HFA (PROVENTIL HFA) 108 (90 Base) MCG/ACT inhaler Inhale 2 puffs into the lungs every 6 hours as needed for Wheezing or Shortness of Breath 18 g 0    TRAZODONE HCL PO Take by mouth (Patient not taking: Reported on 01/06/2023)       No current facility-administered medications on file prior to visit.       Allergies   Allergen Reactions    Oxycodone Anaphylaxis    Parsley Leaves Anaphylaxis, Hives, Itching and Shortness Of Breath    Percocet [Oxycodone-Acetaminophen] Shortness Of Breath    Codeine      Anaphylaxis     Morphine        Past Medical History:   Diagnosis Date    Bursitis     Fibromyalgia     Scoliosis     Tendinitis        Past Surgical History:   Procedure Laterality Date    ABDOMINOPLASTY      COLONOSCOPY N/A 11/18/2022    COLONOSCOPY POLYPECTOMY SNARE BIOPSY performed by Marcia Brash, MD at [facility] [place] [facility]    ESOPHAGOGASTRIC FUNDOPLICATION  2000    GALLBLADDER SURGERY      LAPAROTOMY N/A 11/19/2022    LAPAROTOMY EXPLORATORY.  SMALL BOWEL RESECTION.  INCISIONAL BIOPSY OF MESENTERIC LYMPH NODE performed by Lucious Groves, MD at [facility]  [place] [facility]    TUBAL LIGATION      UPPER GASTROINTESTINAL ENDOSCOPY N/A 11/18/2022    ESOPHAGOGASTRODUODENOSCOPY BIOPSY performed by Marcia Brash, MD at  [facility] [place] [facility]       Family History   Problem Relation Age of Onset    Diabetes Mother     Diabetes Maternal Grandmother        Social History     Tobacco Use    Smoking status: Never    Smokeless tobacco: Never   Substance Use Topics    Alcohol use: Not Currently     Comment: 3/4 times a week    Drug use: Never         Physical Exam:  Vitals:    01/06/23 1431   BP: 113/75   Pulse: 87   Resp: 18   Temp: 97.2 F (36.2 C)   SpO2: 98%      GEN - NAD  Voice: Good projection at high pitch.  Rough with intermittent voice breaks with lower pitches.  Not breathy.  Strong cough.  HEAD - Normocephalic, atraumatic.  FACE - Symmetric, sinuses non-tender to palpation.   EARS: Externally normal, hearing intact to conversational  speech  NOSE - The nares are patent. No rhinorrhea  OC/OP - no concerning masses or lesions of oral cavity oropharynx  NECK - Supple. Trachea is midline.   RESP - Non-labored. No stridor.      --------------------------------------------------------    PROCEDURE:  Flexible fiberoptic nasopharyngolaryngoscopy    DATE AND TIME OF PROCEDURE: 01/06/2023 2:50 PM  SURGEON:  Volney American, MD     INDICATION(S): visualization of larynx    TEACHING: Procedure, benefits, and risks were explained to the parent by the practitioner Consent obtained.  TIME OUT: A time out was conducted immediately before starting the procedure that confirmed a final verification of the correct patient, correct procedure, correct patient position, correct site and availability of special equipment.    SITE: Nose, Nasopharynx, Oropharynx, Hypopharynx, Larynx    PROCEDURAL ANALGESIA/ANESTHESIA: Oxymetazoline, 4% Lidocaine    PROCEDURE: Scope advanced through the right and left nostril to sequentially examine the nasopharynx, palate, oropharynx, base of tongue, epiglottis, larynx, hypopharynx, and piriform sinuses.     TOLERANCE: Very good  COMPLICATIONS: none  ESTIMATED BLOOD LOSS: none  PATHOLOGIC SPECIMEN: none    FINDINGS:   Nasal cavity:     Right:     Patent: Yes   Masses:  no   Posterior turbinate hypertrophy:  No   Septal deviation:  No      Left:    Patent:  Yes   Masses:  No   Posterior turbinate hypertrophy:  No   Septal deviation:  No    Nasopharynx:      Mucosa: none     Eustachian Tube:  clear     Inflammation: mild     Adenoids: None     Masses:  No       Palate and oropharynx:     Palatal movement: normal     Tonsils: normal     Lingual tonsil tissue: is not hypertrophic     Base of tongue:  No masses, lesions, or mucosal abnormality     Vallecula:  No masses, lesions, or mucosal abnormality    Larynx and Hypopharynx:     Hypopharynx: no edema, no erythema, no cobblestoning     Epiglottis: normal     Arytenoids: Normal      Aryepiglottic folds: normal  Vocal Cords: normal movement     Pyriform Sinuses: Without blunting or pooling of secretions. No masses or lesions    IMPRESSION:  -Normal upper airway examination.  Bilateral vocal fold motion intact without concerning masses or lesions    ---------------------------------------------------------------------    Labs:  N/a    Imaging:  PET Dotatate (12/22/2022): FINDINGS:    HEAD/NECK: Physiologic activity within the pituitary and salivary glands.  No  DOTATATE avid lymph nodes.     CHEST: No DOTATATE avid pulmonary nodules or thoracic lymphadenopathy.     ABDOMEN/PELVIS: Focal intense DOTATATE activity associated with the enlarged  mesenteric lymph nodes seen on prior imaging.     Physiologic activity within the liver, spleen, kidneys, and bowel.  There is  probably physiologic activity within the uterus.     BONES/SOFT TISSUE: No abnormal activity localizes to the bones.     INCIDENTAL CT FINDINGS: The gallbladder has been removed.     IMPRESSION:  DOTATATE avid mesenteric lymph nodes consistent with metastatic disease.    Assessment/Plan  Patient is a 47 y.o. female as above presenting to ENT clinic for further evaluation of hoarseness.    -Flexible laryngoscopy performed today as described above.  Specifically, normal upper airway examination.  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  We discussed the role of voice directed speech therapy, but patient would like to wait on this referral at this time.  Return precautions were discussed and patient will call our clinic to return as needed with changes in voice, difficulties breathing, dyne aphasia, dysphagia, etc.    This note was created using voice-to-text dictation.  Although the note was reviewed by the author, please excuse any inaccurate transcriptions that may have been overlooked prior to the signature. Please contact the Thereasa Parkin of  this note with any questions regarding the transcription.      Hermenia Fiscal, MD  Otolaryngology - Head and Neck Surgery  [facility]

## 2023-01-07 NOTE — Telephone Encounter (Signed)
Pt returned offices call and would like to move forward with getting speech referral written up. Please call pt with questions at phone number [phone]

## 2023-01-07 NOTE — Telephone Encounter (Signed)
LVM for patient stating Dr. Albin Fischer recommendations for speech therapy, and if patient interested in speech therapy, patient to call back at [phone] option 3 for Dr. Willette Pa to place referral. No additional needs.

## 2023-01-09 ENCOUNTER — Encounter

## 2023-01-09 NOTE — Care Coordination-Inpatient (Signed)
Ambulatory Care Coordination Note     01/09/2023 3:13 PM     Care Transitions Note    Follow Up Call     Attempted to reach patient for transitions of care follow up.  Unable to reach patient.      Outreach Attempts:   HIPAA compliant voicemail left for patient.     Follow Up Appointment:   Future Appointments         Provider Specialty Dept Phone    01/12/2023 5:00 PM Lorrin Jackson, [facility] [phone]    01/20/2023 9:00 AM Lucile Shutters, [facility] [phone]    01/21/2023 11:45 AM Prudence Davidson, MD Oncology [phone]    01/21/2023 1:30 PM [facility] MED ONC CHAIR 05 Infusion Therapy [phone]    01/26/2023 5:00 PM Lorrin Jackson, [facility] [phone]    01/27/2023 8:00 AM Madilyn Hook, SLP Speech Therapy [phone]    01/28/2023 12:15 PM Levander Campion, APRN - CNP Primary Care [phone]    02/18/2023 1:30 PM [facility] MED ONC CHAIR 05 Infusion Therapy [phone]            Plan for follow-up call in 11-14 days based on severity of symptoms and risk factors. Plan for next call: follow-up appointment-patient had f/u appt with ENT. Patient has upcoming appointments with GI on 01/20/23 and Dr. Cloretta Ned on 01/21/23    Orma Render, RN

## 2023-01-12 ENCOUNTER — Inpatient Hospital Stay: Admit: 2023-01-12 | Discharge: 2023-01-13 | Payer: PRIVATE HEALTH INSURANCE | Primary: Nurse Practitioner

## 2023-01-12 DIAGNOSIS — M542 Cervicalgia: Secondary | ICD-10-CM

## 2023-01-12 NOTE — Other (Signed)
[]  [facility] &  Therapy  [address]  [phone]  [phone] []  [facility]  Outpatient Rehabilitation &  Therapy  [address]  P: [phone]  F: [phone] [x]  [facility]  Outpatient Rehabilitation &  Therapy  [address] [place]: [phone]  F: [phone] []  [facility]  Outpatient Rehabilitation &  Therapy  [address] []  [facility]  Outpatient Rehabilitation &  Therapy  [address] [place] [address]   [place]: [phone]  F: [phone]  []  [facility] - [address]  Outpatient Rehabilitation &  Therapy  [address]  P: [phone]  F: [phone] []  [facility]  Outpatient Rehabilitation &  Therapy  [address]  P: [phone]  F: [phone] []  [facility] &  Therapy  [address]  [place]: [phone]  F: [phone] []  [facility]  Outpatient Rehabilitation &  Therapy  [address]  [place]: [phone]  F: [phone]  []  [facility] [address] [facility] &  Therapy  [address]  [place]: [phone]  F: [phone]     Physical Therapy Daily Treatment Note    Date:  01/12/2023  Patient Name:  Monica Wood    DOB:  15-Sep-1975  MRN: [id number]  Physician: Levander Campion, APRN - CNP Insurance: [facility] [place] after 30 visits  Medical Diagnosis: Cervicalgia                   Rehab Codes: M54.2,  R29.3, M99.01     Onset Date: 08/05/22 script                           Next Dr.'s appt.: prn, oncology July 04/2023  Visit# / total visits: 10/12     Cancels/No Shows: 3/1    Subjective:     Pain:  [x]  Yes  []  No Location: neck, head, R UE  Pain Rating: (0-10 scale) 2/10   Pain altered Tx:  [x]  No  []  Yes  Action:  Comments: Patient reports incr tightness neck and  shoulders today from stressful work day. Overall has been pretty good w/ symptoms, decr N&T, bending arm or leg still causes discomfort and has to sleep w/ arm and leg straight. States inflammation worse w/ what I eat/drink. Has resumed stretches and HEP. Notes abdominal scar from surg is tight.     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 Exploratory laparotomy small bowel resection and lymph node dissection. T3N1. Plan for octreotide injections 1x monthy July 10?  Manual:  IDN cerv-thoracic paraspinals, UT, lev, suboccipitals and homeostatics in prone. Cerv UT stretch MET. manual traction, occipital release. Bilateral UPA cervical and upper thoracic. Abdominal scar massage/mob    Exercises:  Exercise Reps/ Time Weight/ Level Comments   Cerv rot x       UT, lev stretch x                 Post shld rolls x       Scap retraction x  UE tband:   *    Pec stretch  *        Other:     Treatment Charges: Mins Units   []   Modalities     [x]   Ther Exercise 5 NC   [x]   Manual Therapy 45 3   []   Ther Activities     []   Neuro Re-ed     []   Vasocompression     []  Gait     []   Other     Total Billable time         Assessment: [x]  Progressing toward goals. Pt cont's w/ min tightness and tension throughout neck and shoulders. Cont 'd w/ DN and manual as detailed above. Pt w/ good tol and no adverse reaction. Briefly rev HEP and encouraged daily as well as postural awareness esp w/ work duties. Mod tissue tension noted along scar esp at [employer], performed and educated on scar massage/mobilization. Will cont as tol. 1x/bi weekly a week.     []  No change.     [x]  Other: Dry needling performed in conjunction with manual therapy to facilitate circulation, promote tissue mobility and reduce pain. No charge submitted for the time the needle was inserted.   [x]  Patient would continue to  benefit from skilled physical therapy services in order to: Decrease tightness in cervical paraspinals, suboccipitals am pecs and strengthen scapular stabilizers in order to improve overall postural stabilization and decrease strain on head and neck.       STG: (to be met in 6 treatments)  ? Pain: Decrease pain levels to 2/10 at worst in neck and shoulder- MET  ? ROM: Optimize AROM of cervical spine and bilat UE's  ? Strength: Bilat shoulders grossly 5/5, demonstrate improvements in scapular/postural stability   ? Function: Pt to report improved sleep and incr ease w/ ADL's, lifting, reading and driving  Patient to be independent with home exercise program as demonstrated by performance with correct form without cues.     LTG: (to be met in 12 treatments)  Pt to report decr frequency and intensity of HA's and UE N&T  2.  Improve score on assessment tools from 30% impaired to 25% impaired, demonstrating an improvement in overall function     Pt goals: no more pain/numbness also, strengthen grip in hand      Pt. Education:  [x]  Yes  []  No  [x]  Reviewed Prior HEP/Ed  Method of Education: [x]  Verbal  [x]  Demo  []  Written  Comprehension of Education:  [x]  Verbalizes understanding.  []  Demonstrates understanding.  []  Needs review.  []  Demonstrates/verbalizes HEP/Ed previously given.     Plan: [x]  Continue current frequency toward long and short term goals.    [x]  Specific Instructions for subsequent treatments: see above      Time In: 6:02 pm            Time Out: 6:58 pm    Electronically signed by:  Lorrin Jackson, PT

## 2023-01-20 MED ORDER — FAMOTIDINE 40 MG PO TABS
40 MG | ORAL_TABLET | Freq: Every evening | ORAL | 3 refills | Status: AC
Start: 2023-01-20 — End: 2023-05-05
  Filled 2023-01-20: qty 30, 30d supply, fill #0

## 2023-01-21 ENCOUNTER — Ambulatory Visit
Admit: 2023-01-21 | Discharge: 2023-01-21 | Payer: PRIVATE HEALTH INSURANCE | Attending: Internal Medicine | Primary: Nurse Practitioner

## 2023-01-21 ENCOUNTER — Inpatient Hospital Stay: Admit: 2023-01-21 | Discharge: 2023-01-21 | Payer: PRIVATE HEALTH INSURANCE | Primary: Nurse Practitioner

## 2023-01-21 VITALS — BP 119/81 | HR 67 | Temp 97.10000°F | Resp 14 | Wt 206.0 lb

## 2023-01-21 DIAGNOSIS — D3A019 Benign carcinoid tumor of the small intestine, unspecified portion: Secondary | ICD-10-CM

## 2023-01-21 LAB — CBC WITH AUTO DIFFERENTIAL
Basophils %: 1 % (ref 0–2)
Basophils Absolute: 0 10*3/uL (ref 0.0–0.2)
Eosinophils %: 7 % — ABNORMAL HIGH (ref 1–4)
Eosinophils Absolute: 0.4 10*3/uL (ref 0.0–0.4)
Hematocrit: 34.4 % — ABNORMAL LOW (ref 36–46)
Hemoglobin: 10.9 g/dL — ABNORMAL LOW (ref 12.0–16.0)
Lymphocytes %: 43 % (ref 24–44)
Lymphocytes Absolute: 2.2 10*3/uL (ref 1.0–4.8)
MCH: 22.8 pg — ABNORMAL LOW (ref 26–34)
MCHC: 31.6 g/dL (ref 31–37)
MCV: 72.3 fL — ABNORMAL LOW (ref 80–100)
MPV: 7.8 fL (ref 6.0–12.0)
Monocytes %: 10 % (ref 2–11)
Monocytes Absolute: 0.5 10*3/uL (ref 0.1–1.2)
Neutrophils %: 39 % (ref 36–66)
Neutrophils Absolute: 1.9 10*3/uL (ref 1.8–7.7)
Platelets: 308 10*3/uL (ref 140–450)
RBC: 4.76 m/uL (ref 4.0–5.2)
RDW: 16.4 % — ABNORMAL HIGH (ref 12.5–15.4)
WBC: 5 10*3/uL (ref 3.5–11.0)

## 2023-01-21 MED ORDER — OCTREOTIDE ACETATE 20 MG IM KIT
20 | Freq: Once | INTRAMUSCULAR | Status: AC
Start: 2023-01-21 — End: 2023-01-21
  Administered 2023-01-21: 18:00:00 20 mg via INTRAMUSCULAR

## 2023-01-21 MED FILL — SANDOSTATIN LAR DEPOT 20 MG IM KIT: 20 MG | INTRAMUSCULAR | Qty: 1

## 2023-01-21 NOTE — Progress Notes (Signed)
Pt here for Sandostatin injection   Arrives ambulatory   Denies complaint/concern   Labs reviewed   Pt seen by Dr. Cloretta Ned, orders to proceed with tx  Tx complete without incident   Pt discharged in stable condition   Returns 8/7 for Sandostatin

## 2023-01-21 NOTE — Patient Instructions (Signed)
Labs now   Rv in 2 months  Octreotide tx as planned

## 2023-01-21 NOTE — Progress Notes (Signed)
Monica Wood                                                                                                                  01/21/2023  MRN:   [id number]  Date of Birth:  Jul 13, 1976  PCP:                           Levander Campion, APRN - CNP  Referring Physician: No ref. 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  CT PET shows FDG uptake in mesenteric mass.  No other area of uptake noted.  Clinically patient is doing well.  Denies hospitalization ER visit.  Scheduled to start octreotide today    During this visit patient's allergy, social, medical, surgical history and medications were reviewed and updated.    Summary of Case/History:  Monica Wood a 47 y.o.female is a patient with unresectable well-differentiated endocrine tumor of small bowel presents to the clinic to establish care for further workup and management.  Patient complains of having abdominal pain .  Presented to the ER underwent imaging which showed soft tissue mass in the ventral mesentery anterior to the aorta concerning for neoplasm.  Patient underwent EGD and colonoscopy by GI which showed a 1 cm polyp in the colon but no other significant abnormality.  Subsequently patient was taken to the OR for exploratory laparotomy small bowel resection and lymph node dissection.  Pathology from the surgery showed multiple well-differentiated neuroendocrine tumors largest measuring 2.8 cm in the mid small bowel resection.  There was also metastasis noted to the lymph nodes.  Patient had a grade 1 disease.  Ki-67  was 1-2%.  Pathological stage was T3 N1.  Patient is recovering from the surgery.  She has a PET scan scheduled next week.  Presents to the clinic to establish care and for further workup.  Patient also noted to have microcytic anemia.  Iron studies done earlier show adequate iron stores.  Patient also complains of hoarseness of voice which has been going on for a while.    Past Medical History:   Past Medical History:   Diagnosis Date    Bursitis     Fibromyalgia     Scoliosis     Tendinitis        Past Surgical History:  Past Surgical History:   Procedure Laterality Date    ABDOMINOPLASTY      COLONOSCOPY N/A 11/18/2022    COLONOSCOPY POLYPECTOMY SNARE BIOPSY performed by Daboul, Isam,  MD at [facility] [place] [facility]    ESOPHAGOGASTRIC FUNDOPLICATION  2000    GALLBLADDER SURGERY      LAPAROTOMY N/A 11/19/2022    LAPAROTOMY EXPLORATORY.  SMALL BOWEL RESECTION.  INCISIONAL BIOPSY OF MESENTERIC LYMPH NODE performed by Lucious Groves, MD at [facility] OR    TUBAL LIGATION      UPPER GASTROINTESTINAL ENDOSCOPY N/A 11/18/2022    ESOPHAGOGASTRODUODENOSCOPY BIOPSY performed by Marcia Brash, MD at [facility] [place] [facility]     Patient Family Social History:  Family History   Problem Relation Age of Onset    Diabetes Mother     Diabetes Maternal Grandmother      Social History     Tobacco Use    Smoking status: Never    Smokeless tobacco: Never   Substance Use Topics    Alcohol use: Not Currently     Comment: 3/4 times a week    Drug use: Never     Current Medications:  Current Outpatient Medications   Medication Sig Dispense Refill    famotidine (PEPCID) 40 MG tablet Take 1 tablet by mouth every evening 30 tablet 3    lipase-protease-amylase (CREON) 36000-114000 units CPEP delayed release capsule Take 1 capsule by mouth 3 times daily (with meals) 90 capsule 5    dicyclomine (BENTYL) 20 MG tablet Take 1 tablet by mouth every 8 hours as needed (abdominal cramping) 30 tablet 0    senna (SENOKOT) 8.6 MG tablet Take 1 tablet by mouth 2 times  daily 60 tablet 11    DULoxetine (CYMBALTA) 60 MG extended release capsule take 1 capsule by mouth once daily 90 capsule 1    albuterol sulfate HFA (PROVENTIL HFA) 108 (90 Base) MCG/ACT inhaler Inhale 2 puffs into the lungs every 6 hours as needed for Wheezing or Shortness of Breath 18 g 0    DULoxetine (CYMBALTA) 30 MG extended release capsule Take 1 capsule by mouth daily 90 capsule 1    buPROPion (WELLBUTRIN XL) 150 MG extended release tablet Take 1 tablet by mouth every morning 90 tablet 1    tiZANidine (ZANAFLEX) 2 MG tablet Take 1 tablet by mouth daily as needed (back muscle) 30 tablet 1    TRAZODONE HCL PO Take by mouth (Patient not taking: Reported on 01/06/2023)       No current facility-administered medications for this visit.     Allergies:   Oxycodone, Parsley leaves, Percocet [oxycodone-acetaminophen], Codeine, and Morphine    Review of Systems:    Constitutional: No fever or chills. No night sweats, no weight loss   Eyes: No eye discharge, double vision, or eye pain   HEENT: negative for sore mouth, sore throat, hoarseness and voice change   Respiratory: negative for cough , sputum, dyspnea, wheezing, hemoptysis, chest pain   Cardiovascular: negative for chest pain, dyspnea, palpitations, orthopnea, PND   Gastrointestinal: negative for nausea, vomiting, diarrhea, constipation, abdominal pain, Dysphagia, hematemesis and hematochezia.  Positive for abdominal discomfort  Genitourinary: negative for frequency, dysuria, nocturia, urinary incontinence, and hematuria   Integument: negative for rash, skin lesions, bruises.   Hematologic/Lymphatic: negative for easy bruising, bleeding, lymphadenopathy, or petechiae   Endocrine: negative for heat or cold intolerance,weight changes, change in bowel habits and hair loss   Musculoskeletal: negative for myalgias, arthralgias, pain, joint swelling,and bone pain   Neurological: negative for headaches, dizziness, seizures, weakness, numbness    Physical Exam:  Vitals:  BP 119/81   Pulse 67   Temp 97.1 F (36.2 C)  Resp 14   Wt 93.4 kg (206 lb)   SpO2 100%   BMI 34.16 kg/m   General appearance - well appearing, no in pain or distress  Mental status - AAO X3  Eyes - pupils equal and reactive, extraocular eye movements intact  Mouth - mucous membranes moist, pharynx normal without lesions  Neck - supple, no significant adenopathy  Lymphatics - no palpable lymphadenopathy, no hepatosplenomegaly  Chest - clear to auscultation, no wheezes, rales or rhonchi, symmetric air entry  Heart - normal rate, regular rhythm, normal S1, S2, no murmurs  Abdomen - soft, nontender, nondistended, no masses or organomegaly.  Positive healing surgical incision site  Neurological - alert, oriented, normal speech, no focal findings or movement disorder noted  Extremities - peripheral pulses normal, no pedal edema, no clubbing or cyanosis  Skin - normal coloration and turgor, no rashes, no suspicious skin lesions noted       DATA:  Results for orders placed or performed during the hospital encounter of 12/18/22   5 HIAA, Urine   Result Value Ref Range    Hours Collected 24     Urine Volume 585     5-HIAA, Ur 12.0 mg/L    5-HIAA, Urine 7 0 - 15 mg/d    5-HIAA, Interp See Note     Creatinine Urine /volume 278 mg/dL    Creatinine Urine /16 Hr 1,626 (H) 700 - 1,600 mg/d    5-HIAA,Urine 4 0 - 14 mg/gCR     XR HIP BILATERAL W AP PELVIS (2 VIEWS)    Result Date: 12/31/2022  X-rays taken in clinic today and preliminarily reviewed by me 12/25/22: AP pelvis and lateral view of both hips demonstrate some very early degenerative changes/spur formation with overall maintenance of from acetabular joint space.  There is no evidence of acute fracture, obvious AVN or femoral head flattening.        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  Reviewed results of CT PET.  Reviewed images with the patient.  Uptake noted in the mesenteric lymph nodes without any other evidence of visceral metastasis or other areas of uptake.  Patient has unresectable disease.  Patient is asymptomatic at this point.  Will start patient on octreotide for disease control.  Discussed natural history of well-differentiated carcinoid tumor.  Reviewed goals of care and expectations.  Patient has been deemed not surgical candidate.  Without surgery patient disease cannot be cured but patient disease very much treatable.  Patient seen by ENT for hoarseness of voice.  No suspicious lesion noted.  Patient referred to speech therapy.  NCCN guidelines were reviewed and discussed with the patient.  Patient noted to have microcytic anemia with adequate iron stores.  She may have underlying hemoglobinopathy.  Will obtain hemoglobin electrophoresis  The diagnosis and care plan were discussed with the patient in detail. I discussed the natural history of the disease, prognosis, risks and goals of therapy and answered all the patients questions to the best of my ability.  Patient expressed understanding and was in agreement.      Prudence Davidson, MD      This note is created with the assistance of a speech recognition program.  While intending to generate a document that actually reflects the content  of the visit, the document can still have some errors including those of syntax and sound a like substitutions which may escape proof reading.  It such instances, actual meaning can be extrapolated by contextual diversion.

## 2023-01-21 NOTE — Telephone Encounter (Signed)
Instructions   from Dr. Prudence Davidson, MD    Labs now   Rv in 2 months  Octreotide tx as planned       Tx as planned  Labs drawn today  RV 03/25/23 at 12 pm

## 2023-01-21 NOTE — Plan of Care (Signed)
Problem: Safety - Adult  Goal: Free from fall injury  Outcome: Completed

## 2023-01-22 LAB — HEMOGLOBINOPATHY EVALUATION

## 2023-01-23 NOTE — Care Coordination-Inpatient (Signed)
Care Transitions Note    Follow Up Call     Attempted to reach patient for transitions of care follow up.  Unable to reach patient.      Outreach Attempts:   HIPAA compliant voicemail left for patient.     Care Summary Note: patient had follow up appointment with oncology on 01/21/23.    Follow Up Appointment:   Future Appointments         Provider Specialty Dept Phone    01/26/2023 5:00 PM Lorrin Jackson, [facility] [phone]    01/27/2023 8:00 AM Madilyn Hook, SLP Speech Therapy [phone]    01/28/2023 12:15 PM Levander Campion, APRN - CNP Primary Care [phone]    01/29/2023 9:00 AM Lucile Shutters, [facility] [phone]    02/18/2023 1:30 PM [facility] MED ONC CHAIR 05 Infusion Therapy [phone]    03/25/2023 12:00 PM Prudence Davidson, MD Oncology [phone]            Plan for follow-up call in 6-10 days based on severity of symptoms and risk factors. Plan for next call:  outcome of f/u appt with oncology    Orma Render, RN

## 2023-01-26 ENCOUNTER — Inpatient Hospital Stay: Admit: 2023-01-26 | Discharge: 2023-01-26 | Payer: PRIVATE HEALTH INSURANCE | Primary: Nurse Practitioner

## 2023-01-26 NOTE — Other (Signed)
[]  [facility] &  Therapy  [address]  [phone]  [phone] []  [facility]  Outpatient Rehabilitation &  Therapy  [address]  P: [phone]  F: [phone] [x]  [facility]  Outpatient Rehabilitation &  Therapy  [address] [place]: [phone]  F: [phone] []  [facility]  Outpatient Rehabilitation &  Therapy  [address]  [phone] []  [facility]  Outpatient Rehabilitation &  Therapy  [address] [place] [address]   [place]: [phone]  F: [phone]  []  [facility] - [address]  Outpatient Rehabilitation &  Therapy  [address]  P: [phone]  F: [phone] []  [facility]  Outpatient Rehabilitation &  Therapy  [address]  P: [phone]  F: [phone] []  [facility] - [facility] &  Therapy  [address]  [place]: [phone]  F: [phone] []  [facility] - [facility]  Outpatient Rehabilitation &  Therapy  [address]  [place]: [phone]  F: [phone]  []  [facility] &  Therapy  [address]  [place]: [phone]  F: [phone]     Physical Therapy Daily Treatment Note    Date:  01/26/2023  Patient Name:  Monica Wood    DOB:  07-04-1976  MRN: [id number]  Physician: Levander Campion, APRN - CNP Insurance: [facility] [place] after 30 visits  Medical Diagnosis: Cervicalgia                   Rehab Codes: M54.2,  R29.3, M99.01     Onset Date: 08/05/22 script                           Next Dr.'s appt.: prn, oncology July 04/2023  Visit# / total visits: 11/12     Cancels/No Shows: 3/1    Subjective:     Pain:  [x]  Yes  []  No Location: neck, head, R UE  Pain Rating: (0-10 scale) 0/10   Pain altered Tx:  [x]  No  []  Yes  Action:  Comments: Patient reports a little tight in neck today,  has been trying to be more compliant w/ HEP.  Having some sharp pain in belly, has been trying to do more leg ex over the weekend. Had 1st injection last week, tol well except stomach upset and knot in butt. States she has a lot going on family wise and not sleeping well. Eating and drinking normal again, normal B&B. Softened area around navel after last visit.     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 Exploratory laparotomy small bowel resection and lymph node dissection. T3N1. Plan for octreotide injections 1x monthy started January 21 2023  Manual:  IDN cerv-thoracic paraspinals, UT, lev, suboccipitals and homeostatics in prone. Cerv UT stretch MET. manual traction, occipital release. Bilateral UPA cervical and upper thoracic. Abdominal scar massage/mob    Exercises:  Exercise Reps/ Time Weight/ Level Comments   Cerv rot x       UT, lev stretch x  Post shld rolls x       Scap retraction x            UE tband:   *    Pec stretch  *          Core stab: TrA contr, marches 10x ea               Other:     Treatment Charges: Mins Units   []   Modalities     [x]   Ther Exercise 8 1   [x]   Manual Therapy 45 3   []   Ther Activities     []   Neuro Re-ed     []   Vasocompression     []  Gait     []   Other     Total Billable time         Assessment: [x]  Progressing toward goals. Pt w/ min tightness and tension neck and shoulders, improved mobility. Cont 'd w/ DN and manual as detailed above. Pt w/ good tol and no adverse reaction. Briefly rev HEP and encouraged daily as well as postural awareness esp w/ work duties. Cont's w/ mod tissue tension noted along scar esp at [employer] and below, performed scar massage/mobilization as prev w/ good tol and improving scar mobility. Instructed in core stab ex for HEP. Will cont as tol. bi weekly remaining visits.      []  No change.      [x]  Other: Dry needling performed in conjunction with manual therapy to facilitate circulation, promote tissue mobility and reduce pain. No charge submitted for the time the needle was inserted.   [x]  Patient would continue to benefit from skilled physical therapy services in order to: Decrease tightness in cervical paraspinals, suboccipitals am pecs and strengthen scapular stabilizers in order to improve overall postural stabilization and decrease strain on head and neck.       STG: (to be met in 6 treatments)  ? Pain: Decrease pain levels to 2/10 at worst in neck and shoulder- MET  ? ROM: Optimize AROM of cervical spine and bilat UE's  ? Strength: Bilat shoulders grossly 5/5, demonstrate improvements in scapular/postural stability   ? Function: Pt to report improved sleep and incr ease w/ ADL's, lifting, reading and driving  Patient to be independent with home exercise program as demonstrated by performance with correct form without cues.     LTG: (to be met in 12 treatments)  Pt to report decr frequency and intensity of HA's and UE N&T  2.  Improve score on assessment tools from 30% impaired to 25% impaired, demonstrating an improvement in overall function     Pt goals: no more pain/numbness also, strengthen grip in hand      Pt. Education:  [x]  Yes  []  No  [x]  Reviewed Prior HEP/Ed  Method of Education: [x]  Verbal  [x]  Demo  []  Written  Comprehension of Education:  [x]  Verbalizes understanding.  []  Demonstrates understanding.  []  Needs review.  []  Demonstrates/verbalizes HEP/Ed previously given.     Plan: [x]  Continue current frequency toward long and short term goals.    [x]  Specific Instructions for subsequent treatments: see above      Time In: 4:31 pm            Time Out: 5:28 pm    Electronically signed by:  Lorrin Jackson, PT

## 2023-01-27 ENCOUNTER — Inpatient Hospital Stay: Admit: 2023-01-27 | Discharge: 2023-01-29 | Payer: PRIVATE HEALTH INSURANCE | Primary: Nurse Practitioner

## 2023-01-27 DIAGNOSIS — J383 Other diseases of vocal cords: Secondary | ICD-10-CM

## 2023-01-27 NOTE — Consults (Signed)
[]  [facility] - [facility] &  Therapy  [address]  [phone]  F: [phone] []  [facility] - [facility]  Outpatient Rehabilitation &  Therapy  [address]  P: [phone]  F: [phone] [x]  [facility] - [address]  Outpatient Rehabilitation &  Therapy  [address]   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 When asked what brings her to the evaluation, pt reports that she has a raspy voice that affects her singing voice. She reports that this began approximately four or five years prior to the date of the evaluation. She was referred to the ST by her ENT. Pt states that since onset of symptoms with her voice, she has also experienced reduced vocal loudness. When asked to further explain the changes in her voice in regards to vocal loudness, she states that :"if I want my voice to come out, I have to yell". She also reports that if she talks in a higher pitch, she notices a positive change in her voice (describing this as "closer to her normal"); however, if she talks at her normal pitch, that is when her voice is raspy, hoarse, and weaker. Pt described symptoms as "having a frog in my throat".     HPI:   Per chart review and otolaryngology note by referring doctor dated 01/06/2023:  "Monica Wood is 47  y.o. female presenting to ENT clinic for further evaluation of intermittent hoarseness for multiple years. Patient explains that if she talks in a higher pitched voice then she does not have any issues, but if she speaks in her normal pitch it is hard for her to talk.  States that it sounds gravelly, rough, and not as strong.  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  Although this has been a problem for multiple years, she decided to pursue evaluation given newly diagnosed cancer.  Denies dysphagia, odynophagia, stridor, etc."  On same date as mention above, pt underwent a flexible fiberoptic nasopharyngolaryngoscopy; results were as follows:   "IMPRESSION:  -Normal upper airway examination.  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,    []  Stroke []  Sleep apnea [x]  Other:   []  Vascular disease []  Rheumatic disease []  Other:       Medications: [x]  Refer to full medical record []  None []  Other:  Allergies:      [x]  Refer to full medical record []   None []  Other:    Hearing [x]  WFL    []  Other:   Vision []  WFL [x]  Other: has glasses; was not wearing at time of the eval    Hand Dominance  []  Right [x]  Left   Dentition  [x]  WFL []  Partials:  []  Dentures:  []  Other:        Pain:  []  Yes  [x]  No   Location: N/A   Pain Rating: (0-10 scale) 0/10  Pain altered Tx:  []  Yes  [x]  No  Action:     Objective:   Prior Level of Function Current Level of Function   Swallowing [x] WNL  []  Impaired [x] WNL  []  Impaired   Cognition [x] WNL  []  Impaired [x] WNL  []  Impaired   Voice [] WNL  [x]  Impaired (per pt report, has been like this for approx. 5 years). [] WNL  [x]  Impaired   Speech/Language [x] WNL  []  Impaired [x] WNL  []  Impaired      Vocal Hygiene  Daily water intake: [] <2 glasses (16 oz.); [x] 3-4 glasses (17-32 oz); [] 5-7 glasses (33-  56 oz); [] 8 or more glasses (>57 oz)    Daily caffeine intake (coffee, tea, colas, others): pt recently stopped drinking coffee; reports not drinking teas/colas   Daily alcohol servings: [] 0; [] 1; [] 2, [] 3; [] >3; Other: 1x/week; pt reports typically drinking wine  Smoking history:  [x] Nonsmoker  [] Current smoker    [] Former smoker.  At what age did you quit: N/A      Vocal Activities (describe all that apply)   Activity Hrs. 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 when at work    []  Telephone with speakerphone    [x]  Talking: one to one conversation 8 hours d/t work    []  Talking in noisy settings 8 hours/month    []  Talking to groups    []  Yelling or cheering    [x]  Whispering Pt approximates 1 hour/month    [x]  Imitating Others 1 hour/day    [x]  Throat clearing 15 minutes/day; reports primarily in the morning as she is "trying to get rid of the frog in my throat"    [x]  Coughing 1x/day    []  Phonation during exercising    [x]  Singing 3 hours/day    []  Other:        Environmental Issues  (Describe only those that apply):   [] Smoke  [] Chemicals  [] Allergens  [] Temperature changes  Comments: NA    Reflux history [x] Yes [] No  Diagnosis:  [x] Gastroesophageal reflux disease  [] Laryngopharyngeal reflux  [] Other:  Symptoms: burning in throat, coughing, nose running, eyes watering   Frequency of symptoms: 3-4x/month   Management (check all that apply): Pt reports that she previously had surgery to address acid reflux approximately 25 years ago; reports symptoms being managed but occasionally experiences symptoms when eating or drinking certain foods (I.e., coffee and wine in same day, greasier foods)  [] Behavioral   Comments:  [x] Medication   Comments: taking Pepcid daily        Vocal Performer: [] Yes [] No [x]  Previously; no longer performing d/t her voice however does report singing in the car   Vocal training type: unknown  # of years performing: 42 years   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 dB during reading passage 49.6 dB    Respiratory and Phonatory Efficiency    Breath Support  At rest:  [x] Abdominal  [] Thoracic  [] Clavicular  [] Reverse Abdominal  [] Anchored  [] Mixed  Comments:    Sustained Phonation  [x] Abdominal  [] Thoracic  [] Clavicular  [] Reverse Abdominal  [] Anchored  [] Mixed  Comments:    Conversation  [x] Abdominal  [] Thoracic  [] Clavicular  [] Reverse Abdominal  [] Anchored  [] Mixed  Comments:  Speaks on Residual Air: [] yes [] no    Time duration /s/:  # trials 1; ave. seconds 40.9 (norm: 20-25 sec)  Time duration /z/:  # trials 1;  ave. seconds 20.5 (norm: 20-25 sec)  s/z ratio: 1.9  (norm: <1.2 sec)    [] functional for speech  [] reduced laryngeal function relative to respiration      Maximum Phonation Time:   [] adequate for speech  [x] reduced  [] unstable tone  [x] unstable pitch  [] unstable loudness  Comments: ________________________________________________      Maximum Phonation Time (MPT):  Number of trials 3 ave. seconds 9.6 sec (norm:20-25 sec. males, 15-25 sec. females)    Pitch:    Mean Fo in connected speech: 149.1 Hz (norm. 100-200 Hz males/Mean of 125Hz , 175-250 Hz females/Mean of 225Hz )  Fo for  sustained /a/: 110.3 Hz  Jitter during sustained /a/: NT    Pitch Glide : [] WNL; [x] pitch breaks; [] reduced range; [] tension; [] cessation  of voicing. Comments:   Pitch range during speech:   Voice onset delay: [x] not present   [] present.  Comments:    Muscle Tension Assessment    Tension Observed: [] None; [] Jaw; [] Neck; [x] Shoulders; []  Face; []  Lips;  [] Other:  Comments:  Laryngeal Carriage  At rest: [x] neutral carriage; [] high carriage; [] low carriage  Elevation during connected speech:   Elevation during sustained vowel:   Tenderness w/palpation/massage: [] no [] yes ([] right; [] left; [] bilateral)  Reduced thyrohyoid space at rest: [] no [] yes  Tongue base tension w/voicing:     [] no [] yes  At rest: [] no   [] yes  Comments:     Postural Alignment    Stance: [x] balanced; [] slumped; [] militaristic; [] weight forward; [] weight back;  [] right leaning; [] left leaning  Neck: [] free and loose; [] aw jut; [] static;  Shoulders: [x] Symmetrical; [] right higher than left; [] left higher than right;  [] both high;  Pelvis: [x] unremarkable; [] lordosis; [] knees locked;  Comments:     Vocal Quality:  [] Normal   [x] Hoarse  [] Breathy  [] Hard glottal attacks  [] Vocal fry  [x] Strained      [] Diplophonia  [] Aphonia  [x] Pitch Breaks  [] Other:       Impact of Voice Impairment on Functioning:    Activity Limitations and Participation Restrictions (check all that apply):  [] Mild   [] Moderate  [x] Severe  [x] Daily activities  [] Interpersonal interactions  [] Education  [x] Employment  [x] Community  Voice Handicap Index:  Score: 76  Severity: Severe         Assessment/Impression:  Pt presents with a mild-moderate impairment in vocal  function characterized by a hoarse/strained vocal quality with pitch breaks. Pt's subjective measurement on the Voice Handicap Index reflect that pt feels as though her voice has a severely negative effect on her daily life, impacting daily communications as well as her job and her hobby (singing).    Patient would  benefit from skilled speech therapy services in order to: develop/instruct vocal exercise program to improve vocal quality and pt perception of vocal abilities within her daily living activities and work, educate pt on vocal hygiene, and educate other compensatory strategies as needed.       Problem list, as detailed above:    []  Cognition impairments  []  Speech production impairments  []  Speech language impairments  [x]  Voice impairments  []  Swallowing impairements  []  Other      Patient goals: get my voice back     Rehab Potential:  [x]  Good  []  Fair  []  Poor   Suggested Professional Referral:  [x]  No  []  Yes:  Barriers to Goal Achievement:  [x]  No  []  Yes:  Domestic Concerns:  [x]  No  []  Yes:    Pt. Education:  [x]  Plans/Goals, Risks/Benefits discussed  []  Home exercise program     Method of Education: [x]  Verbal  []  Demo  []  Written  Comprehension of Education:  [x]  Verbalizes understanding.  [x]  Demonstrates understanding.  [x]  Needs Review.  []  Demonstrates/verbalizes understanding of HEP/Ed previously given.      Treatment Plan:  [x]  Speech/Lang Therapy  92507 []  Dysphagia Therapy  92526   []  TherIvntj Cog Funcj Contact (1st 15 min)  29518 []  TherIvntj Cog Funcj Contact (addt'l 15 min)  84166         Frequency:  1-2x/week for 12 visits    LTG (to be met in 12 visits):  Pt to demonstrate improved vocal function that is [facility] for home, work, and community.   Pt to demonstrate improved score on Voice Handicap Index to mild to moderate range.     STG (to be met in 6 visits):  Pt will complete relaxation exercises to increase awareness and reduce tension in the oral cavity, pharyngeal/laryngeal, and upper body with 90% accuracy.  Pt will complete vocal onset exercises with 90% accuracy.   Pt will demonstrate comprehension and carryover of vocal hygiene recommendations with 90% accuracy.          Treatment Charges: Mins Units   []  Eval Speech Sound Lang Comprehension     []   Bedside Swallow Eval     []   Speech/Lang  Therapy      []   Dysphagia Therapy     []   TherIvntj Cog Funcj Contact (1st 15 min)     []   TherIvntj Cog Funcj Contact (addt'l 15 min)     [x]   Other: Behavioral Analysis of Voice (92524)  50 1   Total Billable time 50 1       Time in:0800   Time AYT:0160    Electronically signed by: Madilyn Hook, M.S., CF-SLP       .lw    Physician Signature:________________________________Date:__________________  By signing above or cosigning this note, I have reviewed this plan of care and certify a need for medically necessary rehabilitation services.     *PLEASE SIGN ABOVE AND FAX BACK ALL PAGES*

## 2023-01-28 ENCOUNTER — Ambulatory Visit
Admit: 2023-01-28 | Discharge: 2023-01-28 | Payer: PRIVATE HEALTH INSURANCE | Attending: Nurse Practitioner | Primary: Nurse Practitioner

## 2023-01-28 DIAGNOSIS — D3A019 Benign carcinoid tumor of the small intestine, unspecified portion: Secondary | ICD-10-CM

## 2023-01-28 MED ORDER — KETOCONAZOLE 2 % EX CREA
2 % | Freq: Two times a day (BID) | CUTANEOUS | 3 refills | Status: DC
Start: 2023-01-28 — End: 2023-09-02
  Filled 2023-01-28: qty 60, 28d supply, fill #0

## 2023-01-28 NOTE — Progress Notes (Signed)
[facility]  [facility] PRIMARY CARE  [address]  [address]  [place] [address]  Dept: [phone]  Dept Fax: [phone]    Monica Wood is a 47 y.o. female who presents today for her medical conditions/complaintsas noted below.  Trinna Kunst is c/o of RUQ (RUQ pain)    Admitted.   Scope on 7th.   Unable to remove mass. Bx done. 1 ft small intestine removed   Rare tumor. No chemo or radiation   Shot once a month. Repeat PET in 6 months. She is tired which is not new.   HPI:     Patient presents for follow-up  Blood pressure stable  Weight is stable    Patient presents for an office visit.  Since her last appointment in April she was seen in the ER in beginning of May with abdominal pain.  They did do a CT scan which showed a abdominal tumor.  She was admitted.  They did do an endoscopy on the seventh.  They were unable to remove or resect the tumor but they did get a biopsy.  They were also able to remove 1 foot of her small intestine.  Her tumor is rare.  She does not need chemo or radiation.  However she is on a once a month injection.  Their plan is to repeat her PET scan in 6 months.  She is fatigued a lot but not as much    First shot (octreotide) last wednesday x1/month , follow up PET Scan in 6 month- Plan to shrink and remove if possible- Does have a painful lump at site of injection, but she has been massaging and this is to be expected per hemeonc, Prognosis 20-30 years, Abdominal pain has been improving since starting probiotic and creon- did have some of small intestine removed  She is in good spirits despite diagnosis   Oncology- Dr. Cloretta Ned- Sees in August  GI- Dr. Carlisle Cater will see tomorrow    Has been having little white dots on just legs, started as one or two- started in the last few months, thought they could be related to Wellbutrin, Not itchy or painful     Just met dad recently x2 weeks, son is freshman in school- does have therapist        Past Medical  History:   Diagnosis Date    Bursitis     Fibromyalgia     Scoliosis     Tendinitis       Past Surgical History:   Procedure Laterality Date    ABDOMINOPLASTY      COLONOSCOPY N/A 11/18/2022    COLONOSCOPY POLYPECTOMY SNARE BIOPSY performed by Marcia Brash, MD at [facility] [place] [facility]    ESOPHAGOGASTRIC FUNDOPLICATION  2000    GALLBLADDER SURGERY      LAPAROTOMY N/A 11/19/2022    LAPAROTOMY EXPLORATORY.  SMALL BOWEL RESECTION.  INCISIONAL BIOPSY OF MESENTERIC LYMPH NODE performed by Lucious Groves, MD at [facility] OR    TUBAL LIGATION      UPPER GASTROINTESTINAL ENDOSCOPY N/A 11/18/2022    ESOPHAGOGASTRODUODENOSCOPY BIOPSY performed by Marcia Brash, MD at [facility]       Family History   Problem Relation Age of Onset    Diabetes Mother     Diabetes Maternal Grandmother        Social History     Tobacco Use    Smoking status: Never    Smokeless tobacco: Never   Substance Use  Topics    Alcohol use: Not Currently     Comment: 3/4 times a week      Current Outpatient Medications   Medication Sig Dispense Refill    ketoconazole (NIZORAL) 2 % cream Apply 1 Applicatorful topically 2 times daily Apply topically BID for 2-4 weeks 60 g 3    famotidine (PEPCID) 40 MG tablet Take 1 tablet by mouth every evening 30 tablet 3    lipase-protease-amylase (CREON) 36000-114000 units CPEP delayed release capsule Take 1 capsule by mouth 3 times daily (with meals) 90 capsule 5    dicyclomine (BENTYL) 20 MG tablet Take 1 tablet by mouth every 8 hours as needed (abdominal cramping) 30 tablet 0    senna (SENOKOT) 8.6 MG tablet Take 1 tablet by mouth 2 times daily 60 tablet 11    DULoxetine (CYMBALTA) 60 MG extended release capsule take 1 capsule by mouth once daily 90 capsule 1    albuterol sulfate HFA (PROVENTIL HFA) 108 (90 Base) MCG/ACT inhaler Inhale 2 puffs into the lungs every 6 hours as needed for Wheezing or Shortness of Breath 18 g 0    DULoxetine (CYMBALTA) 30 MG extended release capsule Take 1 capsule by mouth daily 90  capsule 1    buPROPion (WELLBUTRIN XL) 150 MG extended release tablet Take 1 tablet by mouth every morning 90 tablet 1    tiZANidine (ZANAFLEX) 2 MG tablet Take 1 tablet by mouth daily as needed (back muscle) 30 tablet 1     No current facility-administered medications for this visit.     Allergies   Allergen Reactions    Oxycodone Anaphylaxis    Parsley Leaves Anaphylaxis, Hives, Itching and Shortness Of Breath    Percocet [Oxycodone-Acetaminophen] Shortness Of Breath    Codeine      Anaphylaxis     Morphine        Health Maintenance   Topic Date Due    Hepatitis B vaccine (1 of 3 - 3-dose series) Never done    COVID-19 Vaccine (1) Never done    DTaP/Tdap/Td vaccine (1 - Tdap) Never done    Flu vaccine (1) 02/12/2023    Depression Monitoring  10/29/2023    Breast cancer screen  04/23/2024    Cervical cancer screen  05/28/2025    Lipids  11/06/2025    Colorectal Cancer Screen  11/17/2032    Hepatitis A vaccine  Aged Out    Hib vaccine  Aged Out    Polio vaccine  Aged Out    Meningococcal (ACWY) vaccine  Aged Out    Pneumococcal 0-64 years Vaccine  Aged Out    Depression Screen  Discontinued    Diabetes screen  Discontinued    Hepatitis C screen  Discontinued    HIV screen  Discontinued       :     Review of Systems   Constitutional:  Positive for fatigue. Negative for chills and fever.   HENT:  Negative for ear discharge, ear pain, sinus pressure, sinus pain, sore throat and trouble swallowing.    Eyes:  Negative for discharge, redness and itching.   Respiratory:  Negative for cough, chest tightness, shortness of breath and wheezing.    Cardiovascular:  Negative for chest pain.   Gastrointestinal:  Negative for abdominal pain, diarrhea, nausea and vomiting.   Genitourinary:  Negative for difficulty urinating.   Musculoskeletal:  Negative for arthralgias and neck pain.   Skin:  Positive for color change. Negative for rash.   Neurological:  Negative for dizziness, weakness, light-headedness and headaches.   All other  systems reviewed and are negative.      Objective:     Physical Exam  Constitutional:       General: She is not in acute distress.     Appearance: Normal appearance. She is normal weight. She is not ill-appearing.   HENT:      Head: Normocephalic and atraumatic.      Nose: Nose normal. No congestion or rhinorrhea.      Mouth/Throat:      Mouth: Mucous membranes are moist.      Pharynx: Oropharynx is clear. No oropharyngeal exudate or posterior oropharyngeal erythema.   Eyes:      Extraocular Movements: Extraocular movements intact.      Conjunctiva/sclera: Conjunctivae normal.      Pupils: Pupils are equal, round, and reactive to light.   Cardiovascular:      Rate and Rhythm: Normal rate and regular rhythm.      Pulses: Normal pulses.      Heart sounds: Normal heart sounds. No murmur heard.  Pulmonary:      Effort: Pulmonary effort is normal. No respiratory distress.      Breath sounds: Normal breath sounds. No wheezing.   Abdominal:      General: Abdomen is flat. Bowel sounds are normal. There is no distension.      Palpations: Abdomen is soft.      Tenderness: There is no abdominal tenderness. There is no guarding.   Musculoskeletal:         General: Normal range of motion.      Cervical back: Normal range of motion and neck supple.   Skin:     General: Skin is warm and dry.      Capillary Refill: Capillary refill takes less than 2 seconds.             Comments: Patient does have small white dots noted on her lower extremities.  There is 1 small area on the right anterior shin that is slightly larger and lighter in color but not completely white   Neurological:      General: No focal deficit present.      Mental Status: She is alert and oriented to person, place, and time.      Motor: No weakness.      Coordination: Coordination normal.      Gait: Gait normal.   Psychiatric:         Mood and Affect: Mood normal.         Behavior: Behavior normal.         Thought Content: Thought content normal.       BP 122/80    Pulse 64   Resp 14   Wt 93.2 kg (205 lb 6.4 oz)   SpO2 99%   BMI 34.06 kg/m     Assessment:   Assessment & Plan    Diagnosis Orders   1. Carcinoid tumor of small intestine, unspecified location, unspecified whether malignant        2. Ileus, postoperative (HCC)        3. Skin lesion  [place] - Heuring, Denny Peon, MD, Dermatology, [facility]          :      Return in about 3 months (around 04/30/2023) for Follow up.  1.  Carcinoid tumor of small intestine  Patient does follow with oncology and GI.  She is on a once a week injection.  Does  not need chemo or radiation.  She is also on octreotide  2.  Postoperative ileus  Resolved  3.  Skin lesion  Patient may have tinea versicolor on her chest.  Will give her ketoconazole  Referral to dermatology for the other white spots    Patient is going to follow-up in 3 months.  She may return sooner if needed  Orders Placed This Encounter   Procedures    [place] - Heuring, Rule, MD, Dermatology, [facility]     Referral Priority:   Routine     Referral Type:   Eval and Treat     Referral Reason:   Specialty Services Required     Referred to Provider:   Heuring, Murlean Iba, MD     Requested Specialty:   Dermatology     Number of Visits Requested:   1     Orders Placed This Encounter   Medications    ketoconazole (NIZORAL) 2 % cream     Sig: Apply 1 Applicatorful topically 2 times daily Apply topically BID for 2-4 weeks     Dispense:  60 g     Refill:  3       Patient given educational materials - seepatient instructions.  Discussed use, benefit, and side effects of prescribed medications.All patient questions answered.  Pt voiced understanding. Reviewed health maintenance.Instructed to continue current medications, diet and exercise.  Patient agreedwith treatment plan. Follow up as directed.      Electronically signed by Levander Campion, APRN - CNP on 7/17/2024at 2:12 PM

## 2023-01-29 ENCOUNTER — Ambulatory Visit
Admit: 2023-01-29 | Discharge: 2023-01-29 | Payer: PRIVATE HEALTH INSURANCE | Attending: Physician Assistant | Primary: Nurse Practitioner

## 2023-01-29 DIAGNOSIS — D3A019 Benign carcinoid tumor of the small intestine, unspecified portion: Secondary | ICD-10-CM

## 2023-01-29 MED FILL — CREON 36000-114000UNIT CPEP: 36000-114000 36000-114000 UNIT | 33 days supply | Qty: 100 | Fill #1 | Status: AC

## 2023-01-29 NOTE — Progress Notes (Addendum)
GI CLINIC FOLLOW UP    INTERVAL HISTORY:   No referring provider defined for this encounter.    Chief Complaint   Patient presents with    Follow-up     6 week follow up s/p egd/colonoscopy f/u. Complaints of LLQ abdominal pain.       HISTORY OF PRESENT ILLNESS: Monica Wood is a 47 y.o. 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 Ct showed partial SBO due to mesenteric mass. Mass was not previously seen on MRI abd 08/14/22. Tumor pathology significant for multiple well-differentiated NETs extending through the muscularis propria into subserosal tissue without penetration of the overlying serosa with local lymph node involvement. CEA, CA125, CA19-9, chromagranin A wnl 11/17/22.    Heme/onc following - Dr Cloretta Ned. PET completed 6/10 which showed dotate avid mesenteric lymph nodes consistent with metastatic disease. She received her first octreotide injection 7/10. Labs did show microcytic anemia with adequate iron stores. Workup ordered for hemoglobinopathy by heme/onc.    Today she reports bowel movements 5-6 times per day. Soft without cramping or rectal pain with BM. No blood. Reports resolution of the "ground glass" sensation following meals, ileus has resolved. Some LLQ tenderness when utilizing her rectus abdominis. No longer reporting constipation since starting creon after elevated fecal elastase. Expresses concerns about a gut parasite due to sensation of "something moving" in her rectal area and perianal itching at bedtime.     Continues to report esophageal dysphagia with meat/bread if she does not chew thoroughly. No strictures/masses appreciated on EGD 5/7. S/p Nissen approx 25 years ago. FL esophagram done 12/11/22 - SBFT not done that was also ordered?? FL Esophagram was unremarkable. Motility test ordered but not yet completed. Taking  pepcid without heartburn.      Denies f/c, night sweats, unintentional weight loss, choking, odynophagia, n/v, black or bloody stools. No etoh or tobacco      Last EGD: 11/18/22 - gastritis with bx pos for focal intestinal metaplasia of stomach, negative for H pylori  Last colonoscopy: 11/18/22 - 1 tubular adenoma, hemorrhoids, 3 yr recall      PAST MEDICAL HISTORY:  Past Medical History:   Diagnosis Date    Bursitis     Fibromyalgia     Scoliosis     Tendinitis        Past Surgical History:   Procedure Laterality Date    ABDOMINOPLASTY      COLONOSCOPY N/A 11/18/2022    COLONOSCOPY POLYPECTOMY SNARE BIOPSY performed by Marcia Brash, MD at [facility] OR    ESOPHAGOGASTRIC FUNDOPLICATION  2000    GALLBLADDER SURGERY      LAPAROTOMY N/A 11/19/2022    LAPAROTOMY EXPLORATORY.  SMALL BOWEL RESECTION.  INCISIONAL BIOPSY OF MESENTERIC LYMPH NODE performed by Lucious Groves, MD at [facility] OR    TUBAL LIGATION      UPPER GASTROINTESTINAL ENDOSCOPY N/A 11/18/2022    ESOPHAGOGASTRODUODENOSCOPY BIOPSY performed by Marcia Brash, MD at [facility] OR       CURRENT MEDICATIONS:    Current Outpatient Medications:     ketoconazole (NIZORAL) 2 % cream, Apply 1 Applicatorful topically 2 times daily Apply topically BID for 2-4 weeks, Disp: 60 g, Rfl: 3    famotidine (PEPCID) 40 MG tablet, Take 1 tablet by mouth every evening, Disp: 30 tablet, Rfl: 3    lipase-protease-amylase (CREON) 29528-413244  units CPEP delayed release capsule, Take 1 capsule by mouth 3 times daily (with meals), Disp: 90 capsule, Rfl: 5    senna (SENOKOT) 8.6 MG tablet, Take 1 tablet by mouth 2 times daily, Disp: 60 tablet, Rfl: 11    DULoxetine (CYMBALTA) 60 MG extended release capsule, take 1 capsule by mouth once daily, Disp: 90 capsule, Rfl: 1    albuterol sulfate HFA (PROVENTIL HFA) 108 (90 Base) MCG/ACT inhaler, Inhale 2 puffs into the lungs every 6 hours as needed for Wheezing or Shortness of Breath, Disp: 18 g, Rfl: 0    DULoxetine (CYMBALTA) 30 MG  extended release capsule, Take 1 capsule by mouth daily, Disp: 90 capsule, Rfl: 1    buPROPion (WELLBUTRIN XL) 150 MG extended release tablet, Take 1 tablet by mouth every morning, Disp: 90 tablet, Rfl: 1    tiZANidine (ZANAFLEX) 2 MG tablet, Take 1 tablet by mouth daily as needed (back muscle), Disp: 30 tablet, Rfl: 1    ALLERGIES:   Allergies   Allergen Reactions    Oxycodone Anaphylaxis    Parsley Leaves Anaphylaxis, Hives, Itching and Shortness Of Breath    Percocet [Oxycodone-Acetaminophen] Shortness Of Breath    Codeine      Anaphylaxis     Morphine        FAMILY HISTORY:       Problem Relation Age of Onset    Diabetes Mother     Diabetes Maternal Grandmother        SOCIAL HISTORY:   Social History     Socioeconomic History    Marital status: Married     Spouse name: Not on file    Number of children: Not on file    Years of education: Not on file    Highest education level: Not on file   Occupational History    Not on file   Tobacco Use    Smoking status: Never    Smokeless tobacco: Never   Vaping Use    Vaping Use: Not on file   Substance and Sexual Activity    Alcohol use: Not Currently     Comment: 3/4 times a week    Drug use: Never    Sexual activity: Not on file   Other Topics Concern    Not on file   Social History Narrative    Not on file     Social Determinants of Health     Financial Resource Strain: Low Risk  (12/01/2022)    Overall Financial Resource Strain (CARDIA)     Difficulty of Paying Living Expenses: Not hard at all   Food Insecurity: No Food Insecurity (12/01/2022)    Hunger Vital Sign     Worried About Running Out of Food in the Last Year: Never true     Ran Out of Food in the Last Year: Never true   Transportation Needs: No Transportation Needs (12/01/2022)    PRAPARE - Therapist, art (Medical): No     Lack of Transportation (Non-Medical): No   Physical Activity: Insufficiently Active (02/16/2022)    Exercise Vital Sign     Days of Exercise per Week: 4 days      Minutes of Exercise per Session: 30 min   Stress: Not on file   Social Connections: Not on file   Intimate Partner Violence: Not At Risk (02/16/2022)    Humiliation, Afraid, Rape, and Kick questionnaire     Fear of Current or Ex-Partner: No  Emotionally Abused: No     Physically Abused: No     Sexually Abused: No   Housing Stability: Low Risk  (11/26/2022)    Housing Stability Vital Sign     Unable to Pay for Housing in the Last Year: No     Number of Places Lived in the Last Year: 1     Unstable Housing in the Last Year: No       REVIEW OF SYSTEMS: A 12-point review of systemswas obtained and pertinent positives and negatives were enumerated above in the history of present illness. All other reviewed systems / symptoms were negative.    Review of Systems   Constitutional:  Negative for appetite change, fatigue and unexpected weight change.   HENT:  Negative for sore throat, trouble swallowing and voice change.    Respiratory:  Negative for cough, choking and wheezing.    Cardiovascular:  Negative for chest pain, palpitations and leg swelling.   Gastrointestinal:  Positive for abdominal pain. Negative for abdominal distention, anal bleeding, blood in stool, constipation, diarrhea, nausea, rectal pain and vomiting.   Genitourinary:  Negative for flank pain.   Allergic/Immunologic: Negative for food allergies and immunocompromised state.   Neurological:  Negative for dizziness, speech difficulty, weakness, light-headedness and headaches.   Hematological:  Does not bruise/bleed easily.   Psychiatric/Behavioral:  Negative for sleep disturbance.        LABORATORY DATA: Reviewed  Lab Results   Component Value Date    WBC 5.0 01/21/2023    RBC 4.76 01/21/2023    HGB  01/21/2023     Normal Hb electrophoretic pattern. The % of HbA2 is normal, decreased MCV. May be observed with Fe deficiency and/or alpha thalassemia trait.  Impression: Normal Hb electrophoretic pattern with microcytosis. Suggest Iron studies. HbA = 97.7%  HbA2 = 2.3%            HGB 10.9 (L) 01/21/2023    HCT 34.4 (L) 01/21/2023    MCV 72.3 (L) 01/21/2023    PLT 308 01/21/2023    NA 138 12/11/2022    K 4.2 12/11/2022    CL 103 12/11/2022    CO2 24 12/11/2022    BUN 9 12/11/2022    CREATININE 0.6 12/11/2022    BILITOT 0.3 12/11/2022    ALKPHOS 103 12/11/2022    AST 42 (H) 12/11/2022    ALT 47 (H) 12/11/2022     No results found for: "ESR", "CRP"    DIAGNOSTIC TESTING:   No results found.       PHYSICAL EXAMINATION: Vital signs reviewed per the nursing documentation.   BP 112/78   Pulse 54   Temp 96.8 F (36 C) (Tympanic)   Resp 16   Ht 1.626 m (5\' 4" )   Wt 93.4 kg (206 lb)   BMI 35.36 kg/m   Body mass index is 35.36 kg/m.   Physical Exam  Constitutional:       General: She is not in acute distress.     Appearance: She is not ill-appearing.   HENT:      Head: Normocephalic.      Mouth/Throat:      Lips: Pink.      Mouth: Mucous membranes are moist. No oral lesions.   Eyes:      General: No scleral icterus.     Pupils: Pupils are equal, round, and reactive to light.   Cardiovascular:      Rate and Rhythm: Normal rate and regular rhythm.  Pulses: No decreased pulses.      Heart sounds: No murmur heard.  Pulmonary:      Effort: Pulmonary effort is normal.      Breath sounds: Normal breath sounds.   Abdominal:      General: Bowel sounds are normal. There is no distension.      Palpations: Abdomen is soft. There is no fluid wave, hepatomegaly, splenomegaly or mass.      Tenderness: There is no abdominal tenderness. There is no guarding. Negative signs include Murphy's sign.          Comments: Mild tenderness along ventral scar, no palpable hernias   Musculoskeletal:         General: No swelling.      Cervical back: Neck supple.      Right lower leg: No edema.      Left lower leg: No edema.   Lymphadenopathy:      Upper Body:      Right upper body: No supraclavicular adenopathy.   Skin:     General: Skin is warm and dry.      Capillary Refill: Capillary refill  takes less than 2 seconds.      Coloration: Skin is not jaundiced.      Findings: No ecchymosis or rash.   Neurological:      Mental Status: She is alert and oriented to person, place, and time.   Psychiatric:         Attention and Perception: Attention normal.         Mood and Affect: Mood is not anxious or depressed.         Speech: Speech is not rapid and pressured.         Behavior: Behavior is not agitated. Behavior is cooperative.         Cognition and Memory: Cognition is not impaired. Memory is not impaired.           IMPRESSION: Monica Wood is a 47 y.o. female with    Diagnosis Orders   1. Carcinoid tumor of small intestine, unspecified location, unspecified whether malignant        2. Perianal itch  Giardia / Cryptosporidum antigens      3. Esophageal dysphagia        4. Loose stools  Giardia / Cryptosporidum antigens      5. LLQ pain           Continue Pepcid  Continue Creon  Senna PRN - has not been needed lately but will keep on list for now  Stool studies due to pt concern for intestinal parasite  Schedule motility study for dysphagia. Negative FL esophagram and EGD for stricture  LLQ pain seems to be worsened with abdominal muscle activation. Likely still sore from laparotomy healing.   Slowly introduce fiber into diet - monitor for pain following meals due to the strictured area in the small bowel. Be careful to prevent ileus.   Keep f/u with speech therapy  Continue octreotide and keep all f/u oncology  Colonoscopy 11/2025    Medications and potential side effects were discussed with patient. GI rx refilled.   Diet/life style/natural hx /complication of the dx were all explained in detail     She should alert me if there are persistent or worsening symptoms, dysphagia, weight loss or GI bleeding.     Past medical, past surgical, social history, psychiatric history, medications or allergies, all reviewed and updated. I personally reviewed all labs results and imaging  studies of the abdomen available  which were ordered by the primary care physician, and the other consultants. I did review all the pathology from the biopsies done on the previous endoscopies.  I have reviewed and agree with the ROS entered by the MA/RN.     Thank you for allowing me to participate in the care of Monica Wood. For any further questions please do not hesitate to contact me.    Electronically signed by Lucile Shutters, PA on 01/29/2023 at 9:50 AM  [facility] Gastroenterology  P: [phone]

## 2023-01-31 ENCOUNTER — Inpatient Hospital Stay: Payer: PRIVATE HEALTH INSURANCE | Primary: Nurse Practitioner

## 2023-01-31 DIAGNOSIS — L290 Pruritus ani: Secondary | ICD-10-CM

## 2023-02-02 LAB — GIARDIA / CRYPTOSPORIDUM ANTIGENS
Cryptosporidium Ag: NEGATIVE
Giardia Ag, Stl: NEGATIVE

## 2023-02-03 ENCOUNTER — Inpatient Hospital Stay: Admit: 2023-02-03 | Discharge: 2023-02-03 | Payer: PRIVATE HEALTH INSURANCE | Primary: Nurse Practitioner

## 2023-02-03 NOTE — Other (Signed)
[]  [facility] &  Therapy  [address]  [phone]  F: [phone] []  [facility]  Outpatient Rehabilitation &  Therapy  [address]  P: [phone]  F: [phone] [x]  [facility] - [address]  Outpatient Rehabilitation &  Therapy  [address]   P: [phone]  F: [phone] []  [facility] &  Therapy  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 Appt: 02/18/2023 oncology   Visit# / total visits: 1/12; Progress note for patient due at visit 6     Cancels/No Shows: 0/0    Subjective:  Pt is in good spirits. She has nothing new to report. ST provided pt with therapy folder on this date.   Pain:  []  Yes  [x]  No Location:  N/A Pain Rating: (0-10 scale) 0/10  Pain altered Tx:  []  No  []  Yes  Action:  Comments:    Precautions: n/a    Objective:  LTG (to be met in 12 visits):  Pt to demonstrate improved vocal function that is [facility] for home, work, and community.   Pt to demonstrate improved score on Voice Handicap Index to mild to moderate range.      STG (to be met in 6 visits):  Pt will complete relaxation exercises to increase awareness and reduce tension in the oral cavity, pharyngeal/laryngeal, and upper body with 90% accuracy.  On this date, ST introduced pt to relaxation exercises. Pt completed 5 reps of selects relaxation exercises with ST demo. ST also introduced yawn-sigh exercises on this date; pt required mod cues and ST demo for  completion.     Pt will complete vocal onset exercises with 90% accuracy.   Not addressed this date.     Pt will demonstrate comprehension and carryover of vocal hygiene recommendations with 90% accuracy.  ST introduced pt to do's and don'ts of vocal hygiene on this date. ST provided pt with a handout reflecting this information. Pt reports that there are many things on the vocal hygiene list that she does, such as clearing her throat often as well as singing and cheering during football season games. ST educated pt on importance of vocal hygiene and discussed alternatives to these things, such as utilizing a light throat clear and drinking sips of water when wanting to clear her throat, as well as utilizing things such as bells or clapping opposed to yelling.   Pt also reports to ST that she often increases her pitch in order to compensate for her voice and how it currently sounds; ST educated pt on how this may cause further harm to her vocal folds and recommended that pt speak in her normal pitch. Pt verbalized understanding.           Assessment: [x]  Progressing toward goals: this is  pt's initial visit. ST spent session introducing pt to HEP as well as reviewing vocal hygiene precautions.     []  No change.     []  Other:  []  Patient would continue to benefit from skilled speech therapy services in order to: develop/instruct vocal exercise program to improve vocal quality and pt perception of vocal abilities within her daily living activities and work, educate pt on vocal hygiene, and educate other compensatory strategies as needed.         Pt. Education:  [x]  Yes; POC/goals; intro to HEP and vocal hygiene   []  No  []  Reviewed Prior HEP/Ed  Method of Education: [x]  Verbal  [x]  Demo  [x]  Written  Comprehension of Education:  []  Verbalizes understanding.  []  Demonstrates understanding.  []  Needs review.  []  Demonstrates/verbalizes HEP/Ed previously given.       Plan: [x]  Continue current frequency toward long and  short term goals.      Treatment Charges: Mins Units   [x]   Speech/Lang Therapy 45 1   []   Dysphagia Therapy     []   TherIvntj Cog Funcj Contact (1st 15 min)     []   TherIvntj Cog Funcj Contact (addt'l 15 min)     []   Other     Total Billable time 45 1         Time In:0800            Time Out: 4098    Electronically signed by:  Madilyn Hook, M.S., CF-SLP

## 2023-02-03 NOTE — Care Coordination-Inpatient (Unsigned)
Care Transitions Note    Follow Up Call     Patient Current Location:      Care Transition Nurse contacted the patient by telephone. Verified name and DOB as identifiers.    Additional needs identified to be addressed with provider   No needs identified                 Method of communication with provider: none.    Care Summary Note: ***    Plan of care updates since last contact:  {CTCINTERVENTIONS:68957}       Advance Care Planning:   Does patient have an Advance Directive: patient declined education.    Medication Review:  No changes since last call.     Remote Patient Monitoring:  Offered patient enrollment in the Remote Patient Monitoring (RPM) program for in-home monitoring: Patient is not eligible for RPM program because: insurance coverage.    Assessments:  Care Transitions Subsequent and Final Call    Subsequent and Final Calls  Care Transitions Interventions  Other Interventions:              Follow Up Appointment:   Reviewed upcoming appointment(s). and TOC appointment attended as scheduled   Future Appointments         Provider Specialty Dept Phone    02/10/2023 8:00 AM Madilyn Hook, SLP Speech Therapy [phone]    02/16/2023 5:00 PM Lorrin Jackson, [facility] [phone]    02/17/2023 8:00 AM Madilyn Hook, SLP Speech Therapy [phone]    02/18/2023 1:30 PM [facility] MED ONC CHAIR 05 Infusion Therapy [phone]    02/24/2023 8:00 AM Madilyn Hook, SLP Speech Therapy [phone]    03/03/2023 8:00 AM Madilyn Hook, SLP Speech Therapy [phone]    03/10/2023 8:00 AM Madilyn Hook, SLP Speech Therapy [phone]    03/25/2023 12:00 PM Prudence Davidson, MD Oncology [phone]    05/04/2023 3:15 PM Marcia Brash, MD Gastroenterology [phone]    05/06/2023 1:00 PM Levander Campion, APRN - CNP Primary Care [phone]            Care Transition Nurse provided contact information.  Plan for follow-up call in 6-10 days based on severity of symptoms and risk factors.  Plan for next call: symptom  management-***   Patient graduated [facility] program. Will continue to follow up with patient with [facility] program.    Orma Render, RN

## 2023-02-03 NOTE — Telephone Encounter (Signed)
Name: Monica Wood  DOB: Jun 18, 1976  MRN: [id number]    Oncology Navigation Follow-Up Note    Contact Type:  Telephone    Notes: Writer called pt to check on her and to see how her injections went and how she's feeling. No answer. Detailed VM left requesting a return call.      Electronically signed by Lenard Simmer, RN on 02/03/2023 at 2:41 PM

## 2023-02-04 MED FILL — BUPROPION HCL ER (XL) 150MG TB24: 150 150 MG | 30 days supply | Qty: 30 | Fill #0 | Status: AC

## 2023-02-04 MED FILL — LISDEXAMFETAMINE DIMESYLATE 40 CAPS: 40 40 MG | 30 days supply | Qty: 30 | Fill #0 | Status: AC

## 2023-02-06 NOTE — Telephone Encounter (Signed)
FMLA faxed to [employer] at [phone] confirmation received  copy also emailed to patient

## 2023-02-10 ENCOUNTER — Inpatient Hospital Stay: Admit: 2023-02-10 | Discharge: 2023-02-10 | Payer: PRIVATE HEALTH INSURANCE | Primary: Nurse Practitioner

## 2023-02-10 NOTE — Other (Signed)
[]  [facility] - [facility] &  Therapy  [address]  [phone]  F: [phone] []  [facility] - [facility]  Outpatient Rehabilitation &  Therapy  [address]  P: [phone]  F: [phone] [x]  [facility] - [facility]  Outpatient Rehabilitation &  Therapy  [address]   P: [phone]  F: [phone] []  [facility] - [facility] &  Therapy  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 Appt: 02/18/2023 oncology   Visit# / total visits: 2/12; Progress note for patient due at visit 6     Cancels/No Shows: 0/0    Subjective:  Pt is in good spirits. She has nothing new to report. Pt did not bring her therapy folder with her on this date, stating that she left it in her car.   Pain:  []  Yes  [x]  No Location:  N/A Pain Rating: (0-10 scale) 0/10  Pain altered Tx:  []  No  []  Yes  Action:  Comments:    Precautions: n/a    Objective:    LTG (to be met in 12 visits):  Pt to demonstrate improved vocal function that is [facility] for home, work, and community.   Pt to demonstrate improved score on Voice Handicap Index to mild to moderate range.      STG (to be met in 6 visits):  Pt will complete relaxation exercises to increase awareness and reduce tension in the oral cavity, pharyngeal/laryngeal, and upper body with 90% accuracy.  Pt completed 10 reps of select relaxation exercises on this date. Pt required min cues from ST for exercise in which she was instructed to place her tongue on the roof of  her mouth and produce a vibrating sound, however after ST cues pt was able to complete reps of the exercises independently.     Pt will complete vocal onset exercises with 90% accuracy.   ST introduced and educated pt on exercises targeting vocal onset on this date:   Pt instructed to say the words "Pattricia Boss ate an apple" with emphasis on placing an /h/ sound at the beginning of the word for an easier onset/reduction of force with vocal folds hitting together.      Pt will demonstrate comprehension and carryover of vocal hygiene recommendations with 90% accuracy.  On this date, pt was noted to utilize a hard throat clear x2 despite previous education regarding importance of not doing so; however, following ST cues, pt was able to demonstrate a light throat clear and sips of water to help clear her throat independently.   Pt reports to ST on this date that she has become increasingly aware of the pressure she is placing on her voice when speaking on the phone/at work and states that she is trying to use her "normal pitch" instead of her compensatory strategy  in which she was attempting to speak at a higher pitch to make her voice "sound better".          Assessment: [x]  Progressing toward goals: Pt independence with relaxation exercises continues is increasing. Pt is able to verbalize components of vocal hygiene recommendations and reports increased awareness. ST to continue focusing on     []  No change.     []  Other:  [x]  Patient would continue to benefit from skilled speech therapy services in order to: develop/instruct vocal exercise program to improve vocal quality and pt perception of vocal abilities within her daily living activities and work, educate pt on vocal hygiene, and educate other compensatory strategies as needed.         Pt. Education:  [x]  Yes; education regarding SD and daily affirmations for pt  []  No  []  Reviewed Prior HEP/Ed  Method of Education: [x]  Verbal  [x]  Demo  [x]  Written  Comprehension of  Education:  [x]  Verbalizes understanding.  [x]  Demonstrates understanding.  []  Needs review.  [x]  Demonstrates/verbalizes HEP/Ed previously given.       Plan: [x]  Continue current frequency toward long and short term goals.      Treatment Charges: Mins Units   [x]   Speech/Lang Therapy 40 1   []   Dysphagia Therapy     []   TherIvntj Cog Funcj Contact (1st 15 min)     []   TherIvntj Cog Funcj Contact (addt'l 15 min)     []   Other     Total Billable time 40 1         Time In:0800            Time Out: 0840    Electronically signed by:  Madilyn Hook, M.S., CF-SLP

## 2023-02-11 NOTE — Care Coordination-Inpatient (Signed)
Ambulatory Care Coordination Note     02/11/2023 2:22 PM     Patient outreach attempt by this ACM today to perform care management follow up . ACM was unable to reach the patient by telephone today; left voice message requesting a return phone call to this ACM.     ACM: Orma Render, RN    PCP/Specialist follow up:   Future Appointments         Provider Specialty Dept Phone    02/16/2023 5:00 PM Lorrin Jackson, [place] [facility] [phone]    02/17/2023 8:00 AM Madilyn Hook, SLP Speech Therapy [phone]    02/18/2023 1:30 PM [facility] PB MED ONC CHAIR 05 Infusion Therapy [phone]    02/24/2023 8:00 AM Madilyn Hook, SLP Speech Therapy [phone]    03/03/2023 8:00 AM Madilyn Hook, SLP Speech Therapy [phone]    03/10/2023 8:00 AM Madilyn Hook, SLP Speech Therapy [phone]    03/25/2023 12:00 PM Prudence Davidson, MD Oncology [phone]    05/04/2023 3:15 PM Marcia Brash, MD Gastroenterology [phone]    05/06/2023 1:00 PM Levander Campion, APRN - CNP Primary Care [phone]    05/20/2023 2:00 PM Otho Darner, PA-C Dermatology [phone]            Follow Up:   Plan for next ACM outreach in approximately 1 week to complete:  - CC Protocol assessments  - medication review  - goal progression  Status of cramping, bm status, and outcome of f/u with therapist .

## 2023-02-16 ENCOUNTER — Inpatient Hospital Stay: Admit: 2023-02-16 | Discharge: 2023-02-16 | Payer: PRIVATE HEALTH INSURANCE | Primary: Nurse Practitioner

## 2023-02-16 NOTE — Other (Signed)
[]  [facility] - [facility] &  Therapy  [address]  [phone] []  [facility] - [facility]  Outpatient Rehabilitation &  Therapy  [address]  P: [phone]  F: [phone] [x]  [facility]  Outpatient Rehabilitation &  Therapy  [address] [place]: [phone]  F: [phone] []  [facility]  Outpatient Rehabilitation &  Therapy  [address] []  [facility]  Outpatient Rehabilitation &  Therapy  [address] [place] [address]   [place]: [phone]  F: [phone]  []  [facility] - [address]  Outpatient Rehabilitation &  Therapy  [address]  P: [phone]  F: [phone] []  [facility] - [facility]  Outpatient Rehabilitation &  Therapy  [address]  P: [phone]  F: [phone] []  [facility] - [facility] &  Therapy  [address]  [place]: [phone]  F: [phone] []  [facility] - [facility]  Outpatient Rehabilitation &  Therapy  [address]  [place]: [phone]  F: [phone]  []  [facility] - [facility] &  Therapy  [address]  [place]: [phone]  F: [phone]     Physical Therapy Daily Treatment Note/Discharge    Date:  02/16/2023  Patient Name:  Monica Wood    DOB:  1976-03-18  MRN: [id number]  Physician: Levander Campion, APRN - CNP Insurance: [facility] after 30 visits  Medical Diagnosis: Cervicalgia                   Rehab Codes: M54.2,  R29.3, M99.01     Onset Date: 08/05/22 script                           Next Dr.'s appt.: prn, oncology July 04/2023  Visit# / total visits: 12/12     Cancels/No Shows: 3/1    Subjective:   Pt reports she feels pretty good overall, has resumed walking and feels better energy not needing to take a nap. States after surg the inflammation came down and has been  feeling better since. Hasn't noticed numbness much lately and sleeping better.   Pain:  []  Yes  [x]  No Location: neck, head, R UE  Pain Rating: (0-10 scale) 0/10   Pain altered Tx:  [x]  No  []  Yes  Action:  Comments: Patient reports a little tight in neck today, has been trying to be more compliant w/ HEP.  Having some sharp pain in belly, has been trying to do more leg ex over the weekend. Had 1st injection last week, tol well except stomach upset and knot in butt. States she has a lot going on family wise and not sleeping well. Eating and drinking normal again, normal B&B. Softened area around navel after last visit.     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 Exploratory laparotomy small bowel resection and lymph node dissection. T3N1. Plan for octreotide injections 1x monthy started January 21 2023  Manual:  IDN cerv-thoracic paraspinals, UT, lev, suboccipitals and homeostatics in prone. Cerv UT stretch MET. manual traction, occipital release. Bilateral UPA cervical and  upper thoracic. Abdominal scar massage/mob    Exercises:  Exercise Reps/ Time Weight/ Level Comments   Cerv rot x       UT, lev stretch x                 Post shld rolls x       Scap retraction x            UE tband:       Pec stretch            Core stab: TrA contr, marches 10x ea               Other:     Treatment Charges: Mins Units   []   Modalities     [x]   Ther Exercise 10 1   [x]   Manual Therapy 30 2   []   Ther Activities     []   Neuro Re-ed     []   Vasocompression     []  Gait     []   Other     Total Billable time         Assessment: [x]  Progressing toward goals. Pt overall feeling better, decr muscle tension, tightness and decr pain symptoms. Cont 'd w/ manual as detailed above w/ good tol and no adverse reaction. Rev HEP and encouraged daily as well as postural awareness esp w/ work duties. Rev scar  massage esp along naval and core stab ex.  Discussed resuming [employer] workouts, incr walking and overall activity as tol. Pt has met all goals, will discharge at this time to indep HEP.     [x]  Re eval:  Sleep is good. Cervical AROM WFL. Shoulder AROM L flex 172, abd 168, ER 88, IRT12. R flex 175, abd 166, ER 92, IR T12. Strength grossly 5/5. Grip strength L 65.2, 49.8, 54 lbs  R 60.8, 59.8, 62.6 lbs. NDI 0% deficit     [x]  Other: Dry needling performed in conjunction with manual therapy to facilitate circulation, promote tissue mobility and reduce pain. No charge submitted for the time the needle was inserted.   [x]  Patient would continue to benefit from skilled physical therapy services in order to: Decrease tightness in cervical paraspinals, suboccipitals am pecs and strengthen scapular stabilizers in order to improve overall postural stabilization and decrease strain on head and neck.       STG: (to be met in 6 treatments)  ? Pain: Decrease pain levels to 2/10 at worst in neck and shoulder- MET  ? ROM: Optimize AROM of cervical spine and bilat UE's MET  ? Strength: Bilat shoulders grossly 5/5, demonstrate improvements in scapular/postural stability MET  ? Function: Pt to report improved sleep and incr ease w/ ADL's, lifting, reading and driving- MET  Patient to be independent with home exercise program as demonstrated by performance with correct form without cues. MET     LTG: (to be met in 12 treatments)  Pt to report decr frequency and intensity of HA's and UE N&T MET  2.  Improve score on assessment tools from 30% impaired to 25% impaired, demonstrating an improvement in overall function MET  0% deficit     Pt goals: no more pain/numbness also, strengthen grip in hand      Pt. Education:  [x]  Yes  []  No  [x]  Reviewed Prior HEP/Ed  Method of Education: [x]  Verbal  [x]  Demo  []  Written  Comprehension of Education:  [x]  Verbalizes understanding.  []  Demonstrates understanding.  []  Needs  review.  []   Demonstrates/verbalizes HEP/Ed previously given.     Plan: [x]  Continue current frequency toward long and short term goals.    [x]  Specific Instructions for subsequent treatments: see above      Time In: 5:00 pm            Time Out: 5:52 pm    Electronically signed by:  Lorrin Jackson, PT

## 2023-02-17 ENCOUNTER — Inpatient Hospital Stay: Admit: 2023-02-17 | Discharge: 2023-02-17 | Payer: PRIVATE HEALTH INSURANCE | Primary: Nurse Practitioner

## 2023-02-17 ENCOUNTER — Encounter
Payer: PRIVATE HEALTH INSURANCE | Attending: Student in an Organized Health Care Education/Training Program | Primary: Nurse Practitioner

## 2023-02-17 NOTE — Other (Signed)
[]  [facility] &  Therapy  [address]  [phone]  F: [phone] []  [facility] - [facility]  Outpatient Rehabilitation &  Therapy  [address]  P: [phone]  F: [phone] [x]  [facility] - [facility]  Outpatient Rehabilitation &  Therapy  [address]   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 Appt: 02/18/2023 oncology   Visit# / total visits: 3/12; Progress note for patient due at visit 6     Cancels/No Shows: 0/0    Subjective:  Pt is in good spirits. She has nothing new to report. Pt brought her therapy folder with her for today's appointment. She did report to ST that she would be unable to make her therapy appointment scheduled for 02/24/2023 d/t having a conflicting dental appointment.   Pain:  []  Yes  [x]  No Location:  N/A Pain Rating: (0-10 scale) 0/10  Pain altered Tx:  []  No  []  Yes  Action:  Comments:    Precautions: n/a    Objective:    LTG (to be met in 12 visits):  Pt to demonstrate improved vocal function that is [facility] for home, work, and community.   Pt to demonstrate improved score on Voice Handicap Index to mild to moderate range.      STG (to be met in 6 visits):  Pt will complete relaxation exercises to increase awareness and reduce tension in the oral cavity, pharyngeal/laryngeal, and upper body with 90% accuracy.  Pt completed 10 reps of select relaxation exercises on this  date. Pt completed exercises with 100% accuracy independently. ST added an additional step to the exercise in which she is to place her tongue on the roof of her mouth and produce a vibrating sound; pt was instructed to add pitch changes up and down when completing this exercise. Pt completed exercise with the additional step given ST demo.     Pt will complete vocal onset exercises with 90% accuracy.   ST introduced and educated pt on exercises targeting vocal onset on this date:   Pt completed 10 reps of blowing bubbles exercise on this date. Pt was instructed to blow bubbles through a straw into a cup of water. After blowing the bubbles, pt was instructed to say the words "Hi Sally". With consistent bubble blowing (approximately ~10-15 seconds of blowing bubbles before voicing) then stating "Hi Kennon Rounds", pt is able to demonstrate an improved vocal quality without pitch breaks. Following repetitions of this exercise, pt became emotional due to her improved voice quality. Pt stated that she can "really tell a difference"; improved voice quality remained throughout ending of ST session following completion of the blowing bubble exercises.         Pt  will demonstrate comprehension and carryover of vocal hygiene recommendations with 90% accuracy.  Pt reports to ST on this date that she has been "bad this week" because something at her work has been irritating her throat and that she has been utilizing a hard throat clear. Pt did state that she knows she is supposed to utilize a light throat clear and take sips of water to help clear her throat but it "doesn't always help". Pt did report that she has continued to be aware of how she uses her voice and tries to not speak in an unnatural pitch (which she previously was doing to compensate for her voice prior to start of ST).        Assessment: [x]  Progressing toward goals:Pt demonstrated independence with relaxation exercises on this date. ST to continue utilizing these  exercises as a warm-up for therapy. Pt demonstrating improved vocal quality following completion of relaxation exercises and bubble blowing exercises.     []  No change.     []  Other:  [x]  Patient would continue to benefit from skilled speech therapy services in order to: develop/instruct vocal exercise program to improve vocal quality and pt perception of vocal abilities within her daily living activities and work, educate pt on vocal hygiene, and educate other compensatory strategies as needed.         Pt. Education:  [x]  Yes; intro bubble blowing exercises   []  No  []  Reviewed Prior HEP/Ed  Method of Education: [x]  Verbal  [x]  Demo  [x]  Written  Comprehension of Education:  [x]  Verbalizes understanding.  [x]  Demonstrates understanding.  []  Needs review.  [x]  Demonstrates/verbalizes HEP/Ed previously given.       Plan: [x]  Continue current frequency toward long and short term goals.      Treatment Charges: Mins Units   [x]   Speech/Lang Therapy 45 1   []   Dysphagia Therapy     []   TherIvntj Cog Funcj Contact (1st 15 min)     []   TherIvntj Cog Funcj Contact (addt'l 15 min)     []   Other     Total Billable time 45 1         Time In:0800            Time Out: 4540    Electronically signed by:  Madilyn Hook, M.S., CF-SLP

## 2023-02-18 ENCOUNTER — Ambulatory Visit: Payer: PRIVATE HEALTH INSURANCE | Primary: Nurse Practitioner

## 2023-02-18 DIAGNOSIS — D3A019 Benign carcinoid tumor of the small intestine, unspecified portion: Secondary | ICD-10-CM

## 2023-02-18 MED ORDER — OCTREOTIDE ACETATE 20 MG IM KIT
20 | Freq: Once | INTRAMUSCULAR | Status: AC
Start: 2023-02-18 — End: 2023-02-18
  Administered 2023-02-18: 18:00:00 20 mg via INTRAMUSCULAR

## 2023-02-18 MED FILL — SANDOSTATIN LAR DEPOT 20 MG IM KIT: 20 MG | INTRAMUSCULAR | Qty: 1

## 2023-02-18 NOTE — Progress Notes (Signed)
Pt here for Sandostatin injection.   Injection given without incident.  Pt discharged in stable condition.   Returns 03-19-23 for next Sandostatin.

## 2023-02-19 NOTE — Care Coordination-Inpatient (Signed)
Ambulatory Care Coordination Note     02/19/2023 4:12 PM     ACM outreach attempt by this ACM today to perform care management follow up . ACM was unable to reach the patient by telephone today; left voice message requesting a return phone call to this ACM.  mychart message sent requesting patient to contact this ACM.     ACM: Orma Render, RN     Care Summary Note: lost to follow up letter sent via mychart.    PCP/Specialist follow up:   Future Appointments         Provider Specialty Dept Phone    02/24/2023 8:00 AM Madilyn Hook, SLP Speech Therapy [phone]    03/03/2023 8:00 AM Madilyn Hook, SLP Speech Therapy [phone]    03/10/2023 8:00 AM Madilyn Hook, SLP Speech Therapy [phone]    03/19/2023 8:30 AM [facility] PB MED ONC CHAIR 20 Infusion Therapy [phone]    03/25/2023 12:00 PM Prudence Davidson, MD Oncology [phone]    05/04/2023 3:15 PM Marcia Brash, MD Gastroenterology [phone]    05/06/2023 1:00 PM Levander Campion, APRN - CNP Primary Care [phone]    05/20/2023 2:00 PM Otho Darner, PA-C Dermatology [phone]            Follow Up:   Plan for next ACM outreach in approximately 1 week to complete:  - CC Protocol assessments  - medication review  - goal progression  Status of cramping, bm status, and outcome of f/u with therapist .

## 2023-02-24 ENCOUNTER — Encounter: Payer: PRIVATE HEALTH INSURANCE | Primary: Nurse Practitioner

## 2023-02-24 NOTE — Telephone Encounter (Signed)
Name: Monica Wood  DOB: 1975-11-05  MRN: [id number]    Oncology Navigation Follow-Up Note    Contact Type:  Telephone    Notes: Pt returned writers call. Pt states overall she is doing well. She states she did have some nausea but denied vomiting. Pt was appreciative of check in call and support. Will continue to follow.      Electronically signed by Lenard Simmer, RN on 02/24/2023 at 1:21 PM

## 2023-02-24 NOTE — Telephone Encounter (Signed)
Name: Monica Wood  DOB: 1975-10-21  MRN: [id number]    Oncology Navigation Follow-Up Note    Contact Type:  Telephone    Notes: Writer called pt to check on her and to see how her last injection went. No answer, detailed VM left requesting a return call.      Electronically signed by Lenard Simmer, RN on 02/24/2023 atW 11:49 AM

## 2023-03-03 ENCOUNTER — Inpatient Hospital Stay: Admit: 2023-03-03 | Discharge: 2023-03-03 | Payer: PRIVATE HEALTH INSURANCE | Primary: Nurse Practitioner

## 2023-03-03 NOTE — Discharge Summary (Signed)
[]  [facility] - [facility] &  Therapy  [address]  [phone]  F: [phone] []  [facility] - [facility]  Outpatient Rehabilitation &  Therapy  [address]  P: [phone]  F: [phone] [x]  [facility] - [facility]  Outpatient Rehabilitation &  Therapy  [address]   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 Appt: unknown   Total visits attended: 5  Cancels/No shows: 0/0   Date of initial visit: 01/27/2023                 Date of final visit: 03/03/2023     Subjective: See most recent treatment note for most recent subjective information.  Pain:  []  Yes  []  No  Location:  N/A Pain Rating: (0-10 scale) 0/10  Pain altered Tx:  []  No  []  Yes  Action:  Comments:    Objective:  LTG (to be met in 12 visits):  Pt to demonstrate improved vocal function that is [facility] for home, work, and community. NOT MET; progress  Pt to demonstrate improved score on Voice Handicap Index to mild to moderate range. NOT MET; unable to reassess     STG (to be met in 6 visits):  Pt will complete relaxation exercises to increase awareness and reduce tension in the oral cavity, pharyngeal/laryngeal, and upper body with 90% accuracy.  MET  Pt will complete vocal onset exercises with 90% accuracy. NOT MET; progress  Pt will demonstrate comprehension and carryover of vocal hygiene recommendations with 90% accuracy.  MET    Assessment:  At pt's last speech therapy session, she requested to place therapy on hold as she was going to start a new job and was unsure if speech therapy appointments would fit into her new work schedule. ST followed up regarding pt's intent to return to therapy on 04/02/2023 as well as 04/23/2023. Pt returned ST's phone call on this date and LVM stating that she did not intend to return to therapy and that she would like to be discharged.     Pt presented to OP speech therapy evaluation with chief complaint being a raspy voice. Throughout therapy course, pt had met 2/3 STGs and was making progress on remaining STG and LTGs. Pt was independent with completion of exercises to increase awareness/reduce tension in oral, pharyngeal/laryngeal and upper body, and was demonstrating carryover of vocal hygiene recommendations as taught/educated by ST. Pt was progressing on goal targeting accurate completion of vocal onset exercises. Pt did not meet her LTGs at time of discharge.       Treatment Plan:  [x]  Speech/Lang Therapy  16109 []  Dysphagia Therapy  92526   []  TherIvntj Cog Funcj Contact (1st 15 min)  60454 []  TherIvntj Cog Funcj Contact (addt'l 15 min)  09811       Discharge Status:     []  Patient recovered from conditions. Treatment goals were met.    []  Patient received maximum benefit. No further therapy indicated at this time.    [x]  Patient to continue exercise/home instructions independently.    []  Therapy interrupted due to:    []  Patient has 2 or more no shows/cancels, is discontinued per our policy.    []  Patient has completed prescribed number of treatment sessions.    [x]  Other: Pt request to be discharged as attending therapy appointments did not fit into her work schedule.       Electronically signed by Madilyn Hook, M.S., CF-SLP on 04/23/2023 at 5:16 PM      If you have any questions or concerns, please don't hesitate to call.  Thank you for your referral.

## 2023-03-03 NOTE — Other (Signed)
[]  [facility] - [facility] &  Therapy  [address]  [phone]  F: [phone] []  [facility] - [facility]  Outpatient Rehabilitation &  Therapy  [address]  P: [phone]  F: [phone] [x]  [facility] - [facility]  Outpatient Rehabilitation &  Therapy  [address]   P: [phone]  F: [phone] []  [facility] - [facility] &  Therapy  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 Appt: 03/19/2023   Visit# / total visits: 4/12; Progress note for patient due at visit 6    Cancels/No Shows: 0/0    Subjective: Pt is in good spirits. She reports to ST that she is starting a new job and that she would like to place therapy on hold for right now because she is unsure if she will have time for appointments with her new schedule.   Pain:  []  Yes  [x]  No Location:  N/A Pain Rating: (0-10 scale) 0/10  Pain altered Tx:  []  No  []  Yes  Action:  Comments:    Precautions: n/a    Objective:  LTG (to be met in 12 visits):  Pt to demonstrate improved vocal function that is [facility] for home, work, and community.   Pt to demonstrate improved score on Voice Handicap Index to mild to moderate range.      STG (to be met in 6 visits):  Pt will complete relaxation exercises to increase awareness and reduce tension in the oral cavity, pharyngeal/laryngeal, and upper body with 90% accuracy.  Pt completed 10 reps of select relaxation exercises on this date. Pt completed exercises with 100% accuracy  independently. Pt is reporting increased awareness of tension in her body and states that she is noticing a difference in the amount of tension she has each day.    Pt will complete vocal onset exercises with 90% accuracy.   ST introduced and educated pt on exercises targeting vocal onset on this date:   Pt completed 10 reps of blowing bubbles exercise on this date, stating "Hi Kennon Rounds" after blowing the bubbles. Pt continues to demonstrate improved vocal quality with repetitions of this exercise.    Pt completed reps of yawn-sigh exercises with introduction of /h/ words and words that begin with vowels on this date. With easy-onset and use of the yawn-sigh technique, pt continues to demonstrate improved vocal quality.     Pt will demonstrate comprehension and carryover of vocal hygiene recommendations with 90% accuracy.  Pt states that she has been "doing good" in terms of her vocal hygiene; she reports that she continues to limit her coffee intake. She also reports that she is limiting her use of cough drops per ST recommendations as menthol in the cough drops can negatively impact the vocal folds/cause drying.   Pt  reports no longer  utilizing her compensatory strategy of talking in a higher pitch to reduce pitch breaks as she was previously doing at the time of the evaluation.         Assessment: [x]  Progressing toward goals: Pt is reporting improved vocal quality outside of ST and an increased awareness of tension.     []  No change.     []  Other:  [x]  Patient would continue to benefit from skilled speech therapy services in order to: develop/instruct vocal exercise program to improve vocal quality and pt perception of vocal abilities within her daily living activities and work, educate pt on vocal hygiene, and educate other compensatory strategies as needed.         Pt. Education:  [x]  Yes; review of vocal hygiene   []  No  []  Reviewed Prior HEP/Ed  Method of Education: [x]  Verbal  [x]  Demo  [x]   Written  Comprehension of Education:  [x]  Verbalizes understanding.  [x]  Demonstrates understanding.  []  Needs review.  [x]  Demonstrates/verbalizes HEP/Ed previously given.       Plan: []  Continue current frequency toward long and short term goals.    [x]  Other: Pt requests ST be placed on hold at this time d/t new job and new schedule. Pt to follow up in future.     Treatment Charges: Mins Units   [x]   Speech/Lang Therapy 37 1   []   Dysphagia Therapy     []   TherIvntj Cog Funcj Contact (1st 15 min)     []   TherIvntj Cog Funcj Contact (addt'l 15 min)     []   Other     Total Billable time 37 1         Time In:0800            Time Out: 1610    Electronically signed by:  Madilyn Hook, M.S., CF-SLP

## 2023-03-05 NOTE — Care Coordination-Inpatient (Signed)
Ambulatory Care Coordination Note     03/05/2023 4:31 PM     Patient Current Location:  [place]     ACM contacted the patient by telephone. Verified name and DOB with patient as identifiers.     Patient graduated from the High Risk Care Management program on 03/05/2023.  Patient verbalizes confidence in the ability to self-manage at this time..  Care management goals have been completed. No further Ambulatory Care Manager follow up scheduled.      ACM: Orma Render, RN     Challenges to be reviewed by the provider   Additional needs identified to be addressed with provider No  none               Method of communication with provider: none.    Care Summary Note: patient states she is feeling good. States her appetite is up and energy is up. States she is continuing injections 1 time per month. Next injection is 03/19/23. States she discontinued speech therapy at this time. Patient states she is starting a new job and [facility] [employer] insurance will term on 03/14/23. Patient states she is doing well at this time and has no further care management needs.    Offered patient enrollment in the Remote Patient Monitoring (RPM) program for in-home monitoring: Patient is not eligible for RPM program because: insurance coverage.     Assessments Completed:   No changes since last call    Medications Reviewed:   Patient denies any changes with medications and reports taking all medications as prescribed.    Advance Care Planning:   Patient declined education     Care Planning:   Education Documentation  Follow-up Appointments, taught by Orma Render, RN at 03/05/2023  4:31 PM.  Learner: Patient  Readiness: Acceptance  Method: Explanation  Response: Verbalizes Understanding    When to Call the Doctor, taught by Orma Render, RN at 03/05/2023  4:31 PM.  Learner: Patient  Readiness: Acceptance  Method: Explanation  Response: Verbalizes Understanding    Review Discharge Plan, taught by Orma Render, RN at 03/05/2023  4:31  PM.  Learner: Patient  Readiness: Acceptance  Method: Explanation  Response: Verbalizes Understanding    General Self Care, taught by Orma Render, RN at 03/05/2023  4:31 PM.  Learner: Patient  Readiness: Acceptance  Method: Explanation  Response: Verbalizes Understanding    Education Comments  No comments found.     ,    Goals Addressed                   This Visit's Progress     COMPLETED: Conditions and Symptoms   On track     I will schedule office visits, as directed by my provider.  I will keep my appointment or reschedule if I have to cancel.  I will notify my provider of any barriers to my plan of care.  I will notify my provider of any symptoms that indicate a worsening of my condition.    Barriers: time constraints  Plan for overcoming my barriers: work with ACM and providers  Confidence: 10/10  Anticipated Goal Completion Date: 01/01/23                 PCP/Specialist follow up:   Future Appointments         Provider Specialty Dept Phone    03/19/2023 8:30 AM [facility] MED ONC CHAIR 20 Infusion Therapy [phone]    03/25/2023 12:00 PM Prudence Davidson, MD Oncology [phone]  05/04/2023 3:15 PM Marcia Brash, MD Gastroenterology [phone]    05/06/2023 1:00 PM Levander Campion, APRN - CNP Primary Care [phone]    05/20/2023 2:00 PM Otho Darner, PA-C Dermatology [phone]            Follow Up:   No further Ambulatory Care Management follow-up scheduled at this time.  Patient  has Ambulatory Care Manager's contact information for any further questions, concerns or needs.

## 2023-03-10 ENCOUNTER — Encounter: Payer: PRIVATE HEALTH INSURANCE | Primary: Nurse Practitioner

## 2023-03-19 ENCOUNTER — Ambulatory Visit: Payer: PRIVATE HEALTH INSURANCE | Primary: Nurse Practitioner

## 2023-03-19 VITALS — BP 117/75 | HR 59

## 2023-03-19 DIAGNOSIS — D3A019 Benign carcinoid tumor of the small intestine, unspecified portion: Secondary | ICD-10-CM

## 2023-03-19 MED ORDER — OCTREOTIDE ACETATE 20 MG IM KIT
20 | Freq: Once | INTRAMUSCULAR | Status: AC
Start: 2023-03-19 — End: 2023-03-19

## 2023-03-19 MED ADMIN — octreotide ACETATE (SANDOSTATIN LAR) injection 20 mg: 20 mg | INTRAMUSCULAR | @ 13:00:00 | NDC 00078081881

## 2023-03-19 MED FILL — SANDOSTATIN LAR DEPOT 20 MG IM KIT: 20 MG | INTRAMUSCULAR | Qty: 1

## 2023-03-19 NOTE — Progress Notes (Signed)
 Pt arrives ambulatory for sandosatin Injection  Pt denies complaints or concerns  Injection complete without incident and patient discharged in stable condition  Next appt  9/11 MD visit

## 2023-03-25 ENCOUNTER — Ambulatory Visit
Admit: 2023-03-25 | Discharge: 2023-03-25 | Payer: PRIVATE HEALTH INSURANCE | Attending: Internal Medicine | Primary: Nurse Practitioner

## 2023-03-25 VITALS — BP 113/82 | HR 63 | Temp 96.80000°F | Resp 18 | Wt 205.5 lb

## 2023-03-25 DIAGNOSIS — D3A019 Benign carcinoid tumor of the small intestine, unspecified portion: Secondary | ICD-10-CM

## 2023-03-25 MED ORDER — LOPERAMIDE HCL 2 MG PO TABS
2 | ORAL_TABLET | Freq: Four times a day (QID) | ORAL | 1 refills | Status: DC | PRN
Start: 2023-03-25 — End: 2023-05-06
  Filled 2023-03-26: qty 48, 12d supply, fill #0

## 2023-03-25 NOTE — Progress Notes (Signed)
 Monica Wood                                                                                                                  03/25/2023  MRN:   [id number]  Date of Birth:  1975/10/09  PCP:                           Cheyenne Raisin, APRN - CNP  Referring Physician: No ref. 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  Patient having diarrhea in the week before she is due for the octreotide .  She is also concerned about coverage of her treatment since she changed her insurance.  Denies hospitalization ER visit.    During this visit patient's allergy, social, medical, surgical history and medications were reviewed and updated.    Summary of Case/History:  Monica Wood a 47 y.o.female is a patient with unresectable well-differentiated endocrine tumor of small bowel presents to the clinic to establish care for further workup and management.  Patient complains of having abdominal pain .  Presented to the ER underwent imaging which showed soft tissue mass in the ventral mesentery anterior to the aorta concerning for neoplasm.  Patient underwent EGD and colonoscopy by GI which showed a 1 cm polyp in the colon but no other significant abnormality.  Subsequently patient was taken to the OR for exploratory laparotomy small bowel resection and lymph node dissection.  Pathology from the surgery showed multiple well-differentiated neuroendocrine tumors largest measuring 2.8 cm in the mid small bowel resection.  There was also metastasis noted to the lymph nodes.  Patient had a grade 1  disease.  Ki-67 was 1-2%.  Pathological stage was T3 N1.  Patient is recovering from the surgery.  She has a PET scan scheduled next week.  Presents to the clinic to establish care and for further workup.  Patient also noted to have microcytic anemia.  Iron studies done earlier show adequate iron stores.  Patient also complains of hoarseness of voice which has been going on for a while.    Past Medical History:   Past Medical History:   Diagnosis Date    Bursitis     Fibromyalgia     Scoliosis     Tendinitis        Past Surgical History:  Past Surgical History:   Procedure Laterality Date    ABDOMINOPLASTY      COLONOSCOPY N/A 11/18/2022    COLONOSCOPY POLYPECTOMY SNARE BIOPSY  performed by Agapito Heman, MD at [facility] [place]    ESOPHAGOGASTRIC FUNDOPLICATION  2000    GALLBLADDER SURGERY      LAPAROTOMY N/A 11/19/2022    LAPAROTOMY EXPLORATORY.  SMALL BOWEL RESECTION.  INCISIONAL BIOPSY OF MESENTERIC LYMPH NODE performed by Teresa Landry LABOR, MD at [facility] OR    TUBAL LIGATION      UPPER GASTROINTESTINAL ENDOSCOPY N/A 11/18/2022    ESOPHAGOGASTRODUODENOSCOPY BIOPSY performed by Agapito Heman, MD at [facility] [place] [facility]     Patient Family Social History:  Family History   Problem Relation Age of Onset    Diabetes Mother     Diabetes Maternal Grandmother      Social History     Tobacco Use    Smoking status: Never    Smokeless tobacco: Never   Substance Use Topics    Alcohol use: Not Currently     Comment: 3/4 times a week    Drug use: Never     Current Medications:  Current Outpatient Medications   Medication Sig Dispense Refill    ketoconazole  (NIZORAL ) 2 % cream Apply 1 Applicatorful topically 2 times daily Apply topically BID for 2-4 weeks 60 g 3    famotidine  (PEPCID ) 40 MG tablet Take 1 tablet by mouth every evening 30 tablet 3    lipase-protease-amylase (CREON ) 36000-114000 units CPEP delayed release capsule Take 1 capsule by mouth 3 times daily (with meals) 90 capsule 5    DULoxetine  (CYMBALTA ) 60 MG extended  release capsule take 1 capsule by mouth once daily 90 capsule 1    albuterol  sulfate HFA (PROVENTIL  HFA) 108 (90 Base) MCG/ACT inhaler Inhale 2 puffs into the lungs every 6 hours as needed for Wheezing or Shortness of Breath 18 g 0    DULoxetine  (CYMBALTA ) 30 MG extended release capsule Take 1 capsule by mouth daily 90 capsule 1    buPROPion  (WELLBUTRIN  XL) 150 MG extended release tablet Take 1 tablet by mouth every morning 90 tablet 1    tiZANidine  (ZANAFLEX ) 2 MG tablet Take 1 tablet by mouth daily as needed (back muscle) 30 tablet 1    senna (SENOKOT) 8.6 MG tablet Take 1 tablet by mouth 2 times daily (Patient not taking: Reported on 03/25/2023) 60 tablet 11     No current facility-administered medications for this visit.     Allergies:   Oxycodone, Parsley leaves, Percocet [oxycodone-acetaminophen ], Codeine, and Morphine     Review of Systems:    Constitutional: No fever or chills. No night sweats, no weight loss   Eyes: No eye discharge, double vision, or eye pain   HEENT: negative for sore mouth, sore throat, hoarseness and voice change   Respiratory: negative for cough , sputum, dyspnea, wheezing, hemoptysis, chest pain   Cardiovascular: negative for chest pain, dyspnea, palpitations, orthopnea, PND   Gastrointestinal: negative for nausea, vomiting, diarrhea, constipation, abdominal pain, Dysphagia, hematemesis and hematochezia.  Positive for abdominal discomfort  Genitourinary: negative for frequency, dysuria, nocturia, urinary incontinence, and hematuria   Integument: negative for rash, skin lesions, bruises.   Hematologic/Lymphatic: negative for easy bruising, bleeding, lymphadenopathy, or petechiae   Endocrine: negative for heat or cold intolerance,weight changes, change in bowel habits and hair loss   Musculoskeletal: negative for myalgias, arthralgias, pain, joint swelling,and bone pain   Neurological: negative for headaches, dizziness, seizures, weakness, numbness    Physical Exam:  Vitals: BP 113/82    Pulse 63   Temp 96.8 F (36 C) (Temporal)   Resp 18  Wt 93.2 kg (205 lb 8 oz)   SpO2 96%   BMI 35.27 kg/m   General appearance - well appearing, no in pain or distress  Mental status - AAO X3  Eyes - pupils equal and reactive, extraocular eye movements intact  Mouth - mucous membranes moist, pharynx normal without lesions  Neck - supple, no significant adenopathy  Lymphatics - no palpable lymphadenopathy, no hepatosplenomegaly  Chest - clear to auscultation, no wheezes, rales or rhonchi, symmetric air entry  Heart - normal rate, regular rhythm, normal S1, S2, no murmurs  Abdomen - soft, nontender, nondistended, no masses or organomegaly.  Positive healing surgical incision site  Neurological - alert, oriented, normal speech, no focal findings or movement disorder noted  Extremities - peripheral pulses normal, no pedal edema, no clubbing or cyanosis  Skin - normal coloration and turgor, no rashes, no suspicious skin lesions noted       DATA:  Results for orders placed or performed during the hospital encounter of 01/31/23   Giardia / Cryptosporidum antigens   Result Value Ref Range    Specimen Description .FECES     Source .FECES     Giardia Ag, Stl NEGATIVE NEGATIVE    Cryptosporidium Ag NEGATIVE NEGATIVE     XR HIP BILATERAL W AP PELVIS (2 VIEWS)    Result Date: 12/31/2022  X-rays taken in clinic today and preliminarily reviewed by me 12/25/22: AP pelvis and lateral view of both hips demonstrate some very early degenerative changes/spur formation with overall maintenance of from acetabular joint space.  There is no evidence of acute fracture, obvious AVN or femoral head flattening.        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  Reviewed results of available  clinical data  Diarrhea likely related to patient's neuroendocrine tumor.  Will try Imodium  if it does not work we will try increasing dose of octreotide  LAR or adding short acting octreotide   Will obtain scans to assess response in December  Discussed natural history of well-differentiated neuroendocrine tumor.  Reviewed goals of care and expectations.  Patient has been deemed not surgical candidate.  Without surgery patient disease cannot be cured but patient disease very much treatable.  Patient seen by ENT for hoarseness of voice.  No suspicious lesion noted.  Patient referred to speech therapy.  Hoarseness of voice is improving  NCCN guidelines were reviewed and discussed with the patient.  Hemoglobin electrophoresis unremarkable however alpha thalassemia not ruled out  The diagnosis and care plan were discussed with the patient in detail. I discussed the natural history of the disease, prognosis, risks and goals of therapy and answered all the patients questions to the best of my ability.  Patient expressed understanding and was in agreement.      LIVIA PENCE, MD      This note is created with the assistance of a speech recognition program.  While intending to generate a document that actually reflects the content of the visit, the document can still have some errors including those of syntax and sound a like substitutions which may escape proof reading.  It such instances, actual meaning can be extrapolated by contextual diversion.

## 2023-03-25 NOTE — Patient Instructions (Signed)
 Rv in November on the day of octreotide injection with labs

## 2023-03-27 MED FILL — FAMOTIDINE 40MG TABS: 40 40 MG | 30 days supply | Qty: 30 | Fill #2 | Status: AC

## 2023-03-27 MED FILL — BUPROPION HCL ER (XL) 150MG TB24: 150 150 MG | 30 days supply | Qty: 30 | Fill #1 | Status: AC

## 2023-03-27 MED FILL — DULOXETINE HCL 30MG CPEP: 30 30 MG | 30 days supply | Qty: 90 | Fill #0 | Status: AC

## 2023-03-27 MED FILL — CREON 36000-114000UNIT CPEP: 36000-114000 36000-114000 UNIT | 33 days supply | Qty: 100 | Fill #2 | Status: AC

## 2023-04-16 ENCOUNTER — Inpatient Hospital Stay: Admit: 2023-04-16 | Discharge: 2023-04-16 | Payer: PRIVATE HEALTH INSURANCE | Primary: Nurse Practitioner

## 2023-04-16 DIAGNOSIS — D3A019 Benign carcinoid tumor of the small intestine, unspecified portion: Secondary | ICD-10-CM

## 2023-04-16 MED ORDER — OCTREOTIDE ACETATE 20 MG IM KIT
20 | Freq: Once | INTRAMUSCULAR | Status: AC
Start: 2023-04-16 — End: 2023-04-16
  Administered 2023-04-16: 13:00:00 20 mg via INTRAMUSCULAR

## 2023-04-16 MED FILL — SANDOSTATIN LAR DEPOT 20 MG IM KIT: 20 MG | INTRAMUSCULAR | Qty: 1

## 2023-04-16 NOTE — Progress Notes (Signed)
 Patient arrived for sandostatin injection.  Arrives ambulatory.  Denies any complaints.  Injection completed without incident.  Discharged in stable condition.  Returns 05/15/2023 for office visit and sandostatin.

## 2023-04-17 ENCOUNTER — Inpatient Hospital Stay
Admit: 2023-04-17 | Discharge: 2023-04-18 | Disposition: A | Discharge status: Home / Self Care | Payer: PRIVATE HEALTH INSURANCE | Attending: Student in an Organized Health Care Education/Training Program

## 2023-04-17 ENCOUNTER — Emergency Department: Admit: 2023-04-17 | Payer: PRIVATE HEALTH INSURANCE | Primary: Nurse Practitioner

## 2023-04-17 DIAGNOSIS — R1084 Generalized abdominal pain: Secondary | ICD-10-CM

## 2023-04-17 LAB — CBC
Hematocrit: 37.2 % (ref 36–46)
Hemoglobin: 11.8 g/dL — ABNORMAL LOW (ref 12.0–16.0)
MCH: 23 pg — ABNORMAL LOW (ref 26–34)
MCHC: 31.7 g/dL (ref 31–37)
MCV: 72.6 fL — ABNORMAL LOW (ref 80–100)
MPV: 7.6 fL (ref 6.0–12.0)
Platelets: 363 10*3/uL (ref 140–450)
RBC: 5.13 m/uL (ref 4.0–5.2)
RDW: 14.5 % (ref 12.5–15.4)
WBC: 6.8 10*3/uL (ref 3.5–11.0)

## 2023-04-17 LAB — COMPREHENSIVE METABOLIC PANEL
ALT: 35 U/L — ABNORMAL HIGH (ref 5–33)
AST: 38 U/L — ABNORMAL HIGH (ref <32)
Albumin/Globulin Ratio: 1.4 (ref 1.0–2.5)
Albumin: 4.1 g/dL (ref 3.5–5.2)
Alkaline Phosphatase: 124 U/L — ABNORMAL HIGH (ref 35–104)
Anion Gap: 11 mmol/L (ref 9–17)
BUN: 11 mg/dL (ref 6–20)
CO2: 23 mmol/L (ref 20–31)
Calcium: 9.2 mg/dL (ref 8.6–10.4)
Chloride: 107 mmol/L (ref 98–107)
Creatinine: 0.8 mg/dL (ref 0.5–0.9)
Est, Glom Filt Rate: >90 mL/min/{1.73_m2} (ref >60)
Glucose: 75 mg/dL (ref 70–99)
Potassium: 4 mmol/L (ref 3.7–5.3)
Sodium: 141 mmol/L (ref 135–144)
Total Bilirubin: 0.4 mg/dL (ref 0.3–1.2)
Total Protein: 7 g/dL (ref 6.4–8.3)

## 2023-04-17 LAB — URINALYSIS WITH REFLEX TO CULTURE
Bilirubin, Urine: NEGATIVE
Glucose, Ur: NEGATIVE mg/dL
Ketones, Urine: NEGATIVE mg/dL
Leukocyte Esterase, Urine: NEGATIVE
Nitrite, Urine: NEGATIVE
Protein, UA: NEGATIVE mg/dL
Specific Gravity, UA: 1.025 (ref 1.005–1.030)
Urine Hgb: NEGATIVE
Urobilinogen, Urine: NORMAL EU/dL (ref 0.0–1.0)
pH, Urine: 6 (ref 5.0–8.0)

## 2023-04-17 LAB — HCG, SERUM, QUALITATIVE: Preg, Serum: NEGATIVE

## 2023-04-17 LAB — TROPONIN: Troponin, High Sensitivity: <6 ng/L (ref 0–14)

## 2023-04-17 LAB — LIPASE: Lipase: 30 U/L (ref 13–60)

## 2023-04-17 MED ORDER — FENTANYL CITRATE (PF) 100 MCG/2ML IJ SOLN
100 | Freq: Once | INTRAMUSCULAR | Status: AC
Start: 2023-04-17 — End: 2023-04-17
  Administered 2023-04-17: 16:00:00 50 ug via INTRAVENOUS

## 2023-04-17 MED ORDER — IOPAMIDOL 76 % IV SOLN
76 | Freq: Once | INTRAVENOUS | Status: AC | PRN
Start: 2023-04-17 — End: 2023-04-17
  Administered 2023-04-17: 17:00:00 100 mL via INTRAVENOUS

## 2023-04-17 MED ORDER — SODIUM CHLORIDE 0.9 % IV BOLUS
0.9 | Freq: Once | INTRAVENOUS | Status: DC
Start: 2023-04-17 — End: 2023-04-17

## 2023-04-17 MED ORDER — NORMAL SALINE FLUSH 0.9 % IV SOLN
0.9 | Freq: Once | INTRAVENOUS | Status: AC
Start: 2023-04-17 — End: 2023-04-17
  Administered 2023-04-17: 17:00:00 10 mL via INTRAVENOUS

## 2023-04-17 MED FILL — FENTANYL CITRATE (PF) 100 MCG/2ML IJ SOLN: 100 MCG/2ML | INTRAMUSCULAR | Qty: 2

## 2023-04-17 NOTE — Telephone Encounter (Signed)
 Patient called in stating that she has severe abdominal pain since yesterday and the pain is radiating to her back and her abdomen is cold to the touch. 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 Writer spoke with walk in clinic provider Gerard Pesa CNP to see if this is something she could see in the walk in clinic or if patient should go to ER    Meghan advised patient should go to ER    Patient notified and verbalized understanding

## 2023-04-17 NOTE — ED Provider Notes (Signed)
 [facility] Emergency Department  [phone] [facility] [address]  [facility] [place] [address]  Phone: [phone]  Fax: [phone]  EMERGENCY DEPARTMENT ENCOUNTER      Pt Wood: Monica Wood  MRN: [id number]  Birthdate [birth year]  Date of evaluation: 04/17/2023    CHIEF COMPLAINT       Chief Complaint   Patient presents with    Abdominal Pain       HISTORY OF PRESENT ILLNESS    Monica Wood is a 47 y.o. female who presents to the emergency department due to severe abdominal pain radiating into her back.  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  She follows with Dr. Tamela.  Is getting infusions, octreotide .  She also has diarrhea, states that the pain has become so severe that she is unable to tolerate it.  Reports it is sharp and tearing.    REVIEW OF SYSTEMS     Review of Systems   Constitutional:  Positive for activity change. Negative for chills and fever.   Respiratory:  Negative for shortness of breath.    Cardiovascular:  Negative for chest pain.   Gastrointestinal:  Positive for abdominal pain and diarrhea.   Genitourinary:  Negative for dysuria and hematuria.   Skin:  Negative for color change and pallor.   Neurological:  Negative for dizziness, syncope and headaches.       PAST MEDICAL HISTORY    has a past medical history of Bursitis, Carcinoid tumor, Fibromyalgia, Scoliosis, and Tendinitis.    SURGICAL HISTORY      has a past surgical history that includes Gallbladder surgery; Abdominoplasty; Tubal ligation; Esophagogastric fundoplication (2000); Upper gastrointestinal endoscopy (N/A, 11/18/2022); Colonoscopy (N/A, 11/18/2022); and laparotomy (N/A, 11/19/2022).    CURRENT MEDICATIONS       Previous Medications    ALBUTEROL  SULFATE HFA (PROVENTIL  HFA) 108 (90 BASE) MCG/ACT INHALER    Inhale 2 puffs into the lungs every 6 hours as needed for Wheezing or  Shortness of Breath    BUPROPION  (WELLBUTRIN  XL) 150 MG EXTENDED RELEASE TABLET    Take 1 tablet by mouth every morning    DULOXETINE  (CYMBALTA ) 30 MG EXTENDED RELEASE CAPSULE    Take 1 capsule by mouth daily    DULOXETINE  (CYMBALTA ) 60 MG EXTENDED RELEASE CAPSULE    take 1 capsule by mouth once daily    FAMOTIDINE  (PEPCID ) 40 MG TABLET    Take 1 tablet by mouth every evening    KETOCONAZOLE  (NIZORAL ) 2 % CREAM    Apply 1 Applicatorful topically 2 times daily Apply topically BID for 2-4 weeks    LIPASE-PROTEASE-AMYLASE (CREON ) 36000-114000 UNITS CPEP DELAYED RELEASE CAPSULE    Take 1 capsule by mouth 3 times daily (with meals)    LOPERAMIDE  (IMODIUM  A-D) 2 MG TABLET    Take 1 tablet by mouth 4 times daily as needed for Diarrhea    SENNA (SENOKOT) 8.6 MG TABLET    Take 1 tablet by mouth 2 times daily    TIZANIDINE  (ZANAFLEX ) 2 MG TABLET    Take 1 tablet by mouth daily as needed (back muscle)       ALLERGIES     is allergic to oxycodone, parsley leaves, percocet [oxycodone-acetaminophen ], codeine, and morphine .    FAMILY HISTORY     She indicated that the status of her mother is unknown.  She indicated that the status of her maternal grandmother is unknown.     family history includes Diabetes in her maternal grandmother and mother.    SOCIAL HISTORY      reports that she has never smoked. She has never used smokeless tobacco. She reports that she does not currently use alcohol. She reports current drug use. Drug: Marijuana Oda).    PHYSICAL EXAM       ED Triage Vitals [04/17/23 1105]   BP Systolic BP Percentile Diastolic BP Percentile Temp Temp Source Pulse Respirations SpO2   129/72 -- -- 98 F (36.7 C) Oral 76 20 97 %      Height Weight - Scale         1.626 m (5' 4) 93.4 kg (206 lb)           Physical Exam  Constitutional:       Appearance: Normal appearance.   HENT:      Head: Normocephalic and atraumatic.      Right Ear: External ear normal.      Left Ear: External ear normal.   Eyes:      Extraocular  Movements: Extraocular movements intact.   Cardiovascular:      Rate and Rhythm: Normal rate.      Pulses: Normal pulses.   Pulmonary:      Effort: Pulmonary effort is normal.      Breath sounds: Normal breath sounds.   Abdominal:      Palpations: Abdomen is soft.      Tenderness: There is no abdominal tenderness. There is no right CVA tenderness or left CVA tenderness.   Musculoskeletal:         General: Normal range of motion.      Cervical back: Normal range of motion.      Comments: No midline CT or L-spine tenderness.   Neurological:      General: No focal deficit present.      Mental Status: She is alert and oriented to person, place, and time.      Comments: Strength and sensation intact in the bilateral lower extremities, 2+ pulses in the bilateral lower extremities.   Psychiatric:         Mood and Affect: Mood normal.           DIFFERENTIAL DIAGNOSIS/ MDM:   Medical Decision Making  47 year old female who presents to the emergency department, has a known history of carcinoid tumor currently following with oncology.  She has tearing abdominal pain that radiates to her back, denies any urinary symptoms.  Belly labs, CTA chest abdomen pelvis ordered to evaluate for aortic etiology with her tumor known to be in close proximity to the aorta.  Patient has an unremarkable exam, states that the pain comes and goes.  She has 2+ pulses distally, vital signs unremarkable.    Laboratory workup unremarkable, CTA shows no signs of dissection.  Discussed results with patient as well as follow-up with PCP and oncology, she is agreeable with plan.  States that her pain is significantly improved.    Amount and/or Complexity of Data Reviewed  Labs: ordered.  Radiology: ordered.  ECG/medicine tests: ordered.    Risk  Prescription drug management.            DIAGNOSTIC RESULTS       LABS:  Results for orders placed or performed during the hospital encounter of 04/17/23   HCG Qualitative, Serum   Result Value Ref Range    Preg,  Serum NEGATIVE NEGATIVE   Urinalysis with Reflex to Culture    Specimen: Urine   Result Value Ref Range    Color, UA Yellow Yellow    Turbidity UA Clear Clear    Glucose, Ur NEGATIVE NEGATIVE mg/dL    Bilirubin, Urine NEGATIVE NEGATIVE    Ketones, Urine NEGATIVE NEGATIVE mg/dL    Specific Gravity, UA 1.025 1.005 - 1.030    Urine Hgb NEGATIVE NEGATIVE    pH, Urine 6.0 5.0 - 8.0    Protein, UA NEGATIVE NEGATIVE mg/dL    Urobilinogen, Urine Normal 0.0 - 1.0 EU/dL    Nitrite, Urine NEGATIVE NEGATIVE    Leukocyte Esterase, Urine NEGATIVE NEGATIVE    Comment       Microscopic exam not performed based on chemical results unless requested in original order.    Comment          Comment       Utilizing a urinalysis as the only screening method to exclude a potential uropathogen can be unreliable in many patient populations.  Rapid screening tests are less sensitive than culture and if UTI is a clinical possibility, culture should be considered despite a negative urinalysis.     Lipase   Result Value Ref Range    Lipase 30 13 - 60 U/L   Comprehensive Metabolic Panel   Result Value Ref Range    Sodium 141 135 - 144 mmol/L    Potassium 4.0 3.7 - 5.3 mmol/L    Chloride 107 98 - 107 mmol/L    CO2 23 20 - 31 mmol/L    Anion Gap 11 9 - 17 mmol/L    Glucose 75 70 - 99 mg/dL    BUN 11 6 - 20 mg/dL    Creatinine 0.8 0.5 - 0.9 mg/dL    Est, Glom Filt Rate >90 >60 mL/min/1.82m2    Calcium 9.2 8.6 - 10.4 mg/dL    Total Protein 7.0 6.4 - 8.3 g/dL    Albumin 4.1 3.5 - 5.2 g/dL    Albumin/Globulin Ratio 1.4 1.0 - 2.5    Total Bilirubin 0.4 0.3 - 1.2 mg/dL    Alkaline Phosphatase 124 (H) 35 - 104 U/L    ALT 35 (H) 5 - 33 U/L    AST 38 (H) <32 U/L   CBC   Result Value Ref Range    WBC 6.8 3.5 - 11.0 k/uL    RBC 5.13 4.0 - 5.2 m/uL    Hemoglobin 11.8 (L) 12.0 - 16.0 g/dL    Hematocrit 62.7 36 - 46 %    MCV 72.6 (L) 80 - 100 fL    MCH 23.0 (L) 26 - 34 pg    MCHC 31.7 31 - 37 g/dL    RDW 85.4 87.4 - 84.5 %    Platelets 363 140 - 450 k/uL    MPV  7.6 6.0 - 12.0 fL   Troponin   Result Value Ref Range    Troponin, High Sensitivity <6 0 - 14 ng/L       Not indicated unless otherwise documented above    RADIOLOGY:   I reviewed the radiologist interpretations:    CTA CHEST ABDOMEN PELVIS W CONTRAST   Final Result   1. No evidence of dissection.   2. Stable mesenteric mass concerning for carcinoid.             Not indicated unless otherwise documented above    EMERGENCY DEPARTMENT COURSE:     The patient was  given the following medications:  Orders Placed This Encounter   Medications    fentaNYL  (SUBLIMAZE ) injection 50 mcg    sodium chloride  0.9 % bolus 80 mL    iopamidol  (ISOVUE -370) 76 % injection 100 mL    sodium chloride  flush 0.9 % injection 10 mL        Vitals:   -------------------------  BP 129/72   Pulse 76   Temp 98 F (36.7 C) (Oral)   Resp 20   Ht 1.626 m (5' 4)   Wt 93.4 kg (206 lb)   SpO2 97%   BMI 35.36 kg/m     ED Course as of 04/17/23 1742   Fri Apr 17, 2023   1203 EKG: Sinus rhythm, heart rate 63, PR interval 168, QTc 446, no significant ST elevation or depression, normal axis, unremarkable T waves [SS]   1401    IMPRESSION:  1. No evidence of dissection.  2. Stable mesenteric mass concerning for carcinoid.   [SS]      ED Course User Index  [SS] Elsye Mccollister, Donnamarie RAMAN, MD         CRITICAL CARE:    None    PROCEDURES:    None      OARRS Report if indicated           The patient understands that at this time there is no evidence for a more malignant underlying process, but also understands that early in the process of an illness or injury, an emergency department workup can be falsely reassuring.  Routine discharge counseling was given, and it is understood that worsening, changing or persistent symptoms should prompt an immediate call or follow up with their primary physician or return to the emergency department. The importance of appropriate follow up was also discussed.  I have reviewed the disposition diagnosis.  I have answered the  questions and given discharge instructions.  There was voiced understanding of these instructions and no further questions or complaints.    FINAL IMPRESSION      1. Generalized abdominal pain          DISPOSITION/PLAN   DISPOSITION Decision To Discharge 04/17/2023 02:10:57 PM  Condition at Disposition: Data Unavailable        CONDITION ON DISPOSITION: See chart       PATIENT REFERRED TO:  Cheyenne Raisin, APRN - CNP  [address]  [address]  [place] [address]  [phone]          [facility] Emergency Department  [phone] [address]  [place]  [address]  [phone]        Tamela Och, MD  [address]  [place] [address]  [phone]            DISCHARGE MEDICATIONS:  New Prescriptions    No medications on file       (Please note that portions of this note were completed with a voice recognition program.  Efforts were made to edit the dictations but occasionally words are mis-transcribed.  Additionally, portions of this note may also include information that was incorporated after care transfer to another provider that were not available at the time of my evaluation.  Some of this information could likely include laboratory values, vital sign updates, medications etc.)    Donnamarie RAMAN Nivedita Mirabella, MD   Attending Emergency Physician        Jleigh Striplin, Donnamarie RAMAN, MD  04/17/23 1750

## 2023-04-17 NOTE — Discharge Instructions (Addendum)
 If given narcotics (opiates) during this Emergency Department visit, please do not drink, drive or operate any machinery for at least 4 - 6 hours.    Please follow up with your pcp and Dr. Tamela, your CTA was unremarkable and so was your laboratory work up.     Avoid eating any spicy food, milk type products or drinks that have caffeine in it.  Take all medications as prescribed.  For pain use ibuprofen (Motrin) or acetaminophen  (Tylenol ), unless prescribed medications that have acetaminophen  in it.  You can take over the counter acetaminophen  tablets (1 - 2 tablets of the 500-mg strength every 6 hours) or ibuprofen tablets (2 tablets every 4 hours).    PLEASE RETURN TO THE EMERGENCY DEPARTMENT IMMEDIATELY for worsening symptoms, or if you develop any concerning symptoms such as: high fever not relieved by acetaminophen  (Tylenol ) and/or ibuprofen (Motrin), chills, shortness of breath, chest pain, persistent nausea and/or vomiting, numbness, weakness or tingling in the arms or legs or change in color of the extremities, changes in mental status, persistent headache, blurry vision.    Return within 8 - 12 hours if you have any of the following: worsening of pain in your abdomen, no food sounds good to you, you continue to vomit, pain goes to your back, have pain in the abdomen when going over a bump in the car or when you jump up and down, develop vaginal bleeding or discharge, inability to urinate, unable to follow up with your physician, or other any other care or concern.

## 2023-04-20 LAB — EKG 12-LEAD
Atrial Rate: 63 {beats}/min
P Axis: 58 degrees
P-R Interval: 168 ms
Q-T Interval: 436 ms
QRS Duration: 86 ms
QTc Calculation (Bazett): 446 ms
R Axis: 23 degrees
T Axis: 41 degrees
Ventricular Rate: 63 {beats}/min

## 2023-04-20 MED FILL — LISDEXAMFETAMINE DIMESYLATE 40 CAPS: 40 40 MG | 30 days supply | Qty: 30 | Fill #0 | Status: AC

## 2023-05-04 ENCOUNTER — Ambulatory Visit
Admit: 2023-05-04 | Discharge: 2023-05-04 | Payer: PRIVATE HEALTH INSURANCE | Attending: Gastroenterology | Primary: Nurse Practitioner

## 2023-05-04 ENCOUNTER — Inpatient Hospital Stay: Payer: PRIVATE HEALTH INSURANCE | Primary: Nurse Practitioner

## 2023-05-04 DIAGNOSIS — D3A019 Benign carcinoid tumor of the small intestine, unspecified portion: Secondary | ICD-10-CM

## 2023-05-04 LAB — PROTIME-INR
INR: <0.8
Protime: <9 s — ABNORMAL LOW (ref 9.4–12.6)

## 2023-05-04 MED ORDER — DIPHENOXYLATE-ATROPINE 2.5-0.025 MG PO TABS
ORAL_TABLET | Freq: Four times a day (QID) | ORAL | 0 refills | Status: AC | PRN
Start: 2023-05-04 — End: 2023-05-14
  Filled 2023-05-06: qty 40, 10d supply, fill #0

## 2023-05-04 NOTE — Progress Notes (Signed)
GI CLINIC FOLLOW UP    INTERVAL HISTORY:   No referring provider defined for this encounter.    Chief Complaint   Patient presents with    Follow-up     3 month follow up with labs done.        HISTORY OF PRESENT ILLNESS: [name] is a 47 y.o. 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    Here for f/u  Patient initially presented to ER on 11/16/22 for abd pain. Ct showed partial SBO due to mesenteric mass. Mass was not previously seen on MRI abd 08/14/22. Tumor pathology significant for multiple well-differentiated NETs extending through the muscularis propria into subserosal tissue without penetration of the overlying serosa with local lymph node involvement. CEA, CA125, CA19-9, chromagranin A wnl 11/17/22.    Last PET scan 12/22/22 -  avid mesenteric lymph nodes consistent with metastatic disease.     Following with heme onc - Dr Cloretta Ned - last visit 03/25/23. Trial of imodium for diarrhea 2/2  NET. Due for scans in December.  Referred to speech and ENT for hoarseness. Labs showing hgb 11.8, elevated LFTs   Dyshpagia and hoarseness has improved. She continues to do speech therapy exercises at home.   Next onc appt 05/15/23    Went to the ED 04/16/21 for severe abdominal pain. CT abdomen in the ED showed stable mesenteric mass concerning for carcinoid.    Today she reports that her pain continues to be present in the RUQ and is worse after eating. This is where her pain was located when she had ileus/SBO. Worse with eating bread. Pain is improved with Creon. The final week before her octreotide injection is due she has worsened diarrhea requiring imodium. She is s/p chole    Last EGD: 11/18/22 - gastritis with bx pos for focal intestinal metaplasia of stomach, negative for H pylori  Last colonoscopy: 11/18/22 - 1 tubular adenoma, hemorrhoids, 3 yr recall          Past Medical,Family, and Social History reviewed  and does contribute to the patient presentingcondition.    I did review all the labs results available for the labs which were ordered by the primary care physician, and the other consultants, we search on epic at [facility] and all the available care everywhere epic    I did review all the imaging studies of the abdomen available on EMR, ordered by the primary care physician and the other consultant    I did review all the pathology from the biopsies done on the previous endoscopies    Patient's PMH/PSH,SH,PSYCH Hx, MEDs, ALLERGIES, and ROS were all reviewed and updated in the appropriate sections.    PAST MEDICAL HISTORY:  Past Medical History:   Diagnosis Date    Bursitis     Carcinoid tumor     Fibromyalgia     Scoliosis     Tendinitis        Past Surgical History:   Procedure Laterality Date    ABDOMINOPLASTY      COLONOSCOPY N/A 11/18/2022    COLONOSCOPY POLYPECTOMY SNARE BIOPSY performed by Marcia Brash, MD at [facility] [place] [facility]    ESOPHAGOGASTRIC FUNDOPLICATION  2000    GALLBLADDER SURGERY      LAPAROTOMY N/A 11/19/2022    LAPAROTOMY EXPLORATORY.  SMALL BOWEL RESECTION.  INCISIONAL BIOPSY OF MESENTERIC LYMPH NODE performed by Lucious Groves, MD at [facility]  TUBAL LIGATION      UPPER GASTROINTESTINAL ENDOSCOPY N/A 11/18/2022    ESOPHAGOGASTRODUODENOSCOPY BIOPSY performed by Marcia Brash, MD at [facility] [place] [facility]       CURRENT MEDICATIONS:    Current Outpatient Medications:     diphenoxylate-atropine (LOMOTIL) 2.5-0.025 MG per tablet, Take 1 tablet by mouth 4 times daily as needed for Diarrhea for up to 10 days. Max Daily Amount: 4 tablets, Disp: 40 tablet, Rfl: 0    loperamide (IMODIUM A-D) 2 MG tablet, Take 1 tablet by mouth 4 times daily as needed for Diarrhea, Disp: 60 tablet, Rfl: 1    ketoconazole (NIZORAL) 2 % cream, Apply 1 Applicatorful topically 2 times daily Apply topically BID for 2-4 weeks, Disp: 60 g, Rfl: 3    famotidine (PEPCID) 40 MG tablet, Take 1 tablet by mouth every evening, Disp: 30  tablet, Rfl: 3    lipase-protease-amylase (CREON) 36000-114000 units CPEP delayed release capsule, Take 1 capsule by mouth 3 times daily (with meals), Disp: 90 capsule, Rfl: 5    senna (SENOKOT) 8.6 MG tablet, Take 1 tablet by mouth 2 times daily, Disp: 60 tablet, Rfl: 11    DULoxetine (CYMBALTA) 60 MG extended release capsule, take 1 capsule by mouth once daily, Disp: 90 capsule, Rfl: 1    albuterol sulfate HFA (PROVENTIL HFA) 108 (90 Base) MCG/ACT inhaler, Inhale 2 puffs into the lungs every 6 hours as needed for Wheezing or Shortness of Breath, Disp: 18 g, Rfl: 0    DULoxetine (CYMBALTA) 30 MG extended release capsule, Take 1 capsule by mouth daily, Disp: 90 capsule, Rfl: 1    buPROPion (WELLBUTRIN XL) 150 MG extended release tablet, Take 1 tablet by mouth every morning, Disp: 90 tablet, Rfl: 1    tiZANidine (ZANAFLEX) 2 MG tablet, Take 1 tablet by mouth daily as needed (back muscle), Disp: 30 tablet, Rfl: 1    ALLERGIES:   Allergies   Allergen Reactions    Oxycodone Anaphylaxis    Parsley Leaves Anaphylaxis, Hives, Itching and Shortness Of Breath    Percocet [Oxycodone-Acetaminophen] Shortness Of Breath    Codeine      Anaphylaxis     Morphine        FAMILY HISTORY:       Problem Relation Age of Onset    Diabetes Mother     Diabetes Maternal Grandmother          SOCIAL HISTORY:   Social History     Socioeconomic History    Marital status: Married     Spouse name: Not on file    Number of children: Not on file    Years of education: Not on file    Highest education level: Not on file   Occupational History    Not on file   Tobacco Use    Smoking status: Never    Smokeless tobacco: Never   Vaping Use    Vaping status: Not on file   Substance and Sexual Activity    Alcohol use: Not Currently     Comment: 3/4 times a week    Drug use: Yes     Types: Marijuana (Weed)     Comment: uses gummies periodically for pain control    Sexual activity: Not on file   Other Topics Concern    Not on file   Social History Narrative     Not on file     Social Determinants of Health     Financial Resource Strain: Low Risk  (  12/01/2022)    Overall Financial Resource Strain (CARDIA)     Difficulty of Paying Living Expenses: Not hard at all   Food Insecurity: No Food Insecurity (12/01/2022)    Hunger Vital Sign     Worried About Running Out of Food in the Last Year: Never true     Ran Out of Food in the Last Year: Never true   Transportation Needs: No Transportation Needs (12/01/2022)    PRAPARE - Therapist, art (Medical): No     Lack of Transportation (Non-Medical): No   Physical Activity: Insufficiently Active (02/16/2022)    Exercise Vital Sign     Days of Exercise per Week: 4 days     Minutes of Exercise per Session: 30 min   Stress: Not on file   Social Connections: Not on file   Intimate Partner Violence: Not At Risk (02/16/2022)    Humiliation, Afraid, Rape, and Kick questionnaire     Fear of Current or Ex-Partner: No     Emotionally Abused: No     Physically Abused: No     Sexually Abused: No   Housing Stability: Low Risk  (11/26/2022)    Housing Stability Vital Sign     Unable to Pay for Housing in the Last Year: No     Number of Places Lived in the Last Year: 1     Unstable Housing in the Last Year: No       REVIEW OF SYSTEMS: A 12-point review of systemswas obtained and pertinent positives and negatives were enumerated above in the history of present illness. All other reviewed systems / symptoms were negative.    Review of Systems   Constitutional:  Negative for appetite change, fatigue and unexpected weight change.   HENT:  Negative for sore throat, trouble swallowing and voice change.    Respiratory:  Negative for cough, choking and wheezing.    Cardiovascular:  Negative for chest pain, palpitations and leg swelling.   Gastrointestinal:  Positive for abdominal distention, abdominal pain and diarrhea. Negative for anal bleeding, blood in stool, constipation, nausea, rectal pain and vomiting.   Genitourinary:  Negative  for flank pain.   Allergic/Immunologic: Negative for food allergies and immunocompromised state.   Neurological:  Negative for dizziness, speech difficulty, weakness, light-headedness and headaches.   Hematological:  Does not bruise/bleed easily.   Psychiatric/Behavioral:  Negative for sleep disturbance.            LABORATORY DATA: Reviewed  Lab Results   Component Value Date    WBC 6.8 04/17/2023    HGB 11.8 (L) 04/17/2023    HCT 37.2 04/17/2023    MCV 72.6 (L) 04/17/2023    PLT 363 04/17/2023    NA 141 04/17/2023    K 4.0 04/17/2023    CL 107 04/17/2023    CO2 23 04/17/2023    BUN 11 04/17/2023    CREATININE 0.8 04/17/2023    BILITOT 0.4 04/17/2023    ALKPHOS 124 (H) 04/17/2023    AST 38 (H) 04/17/2023    ALT 35 (H) 04/17/2023         Lab Results   Component Value Date    RBC 5.13 04/17/2023    HGB 11.8 (L) 04/17/2023    MCV 72.6 (L) 04/17/2023    MCH 23.0 (L) 04/17/2023    MCHC 31.7 04/17/2023    RDW 14.5 04/17/2023    MPV 7.6 04/17/2023    BASOPCT 1 01/21/2023  LYMPHSABS 2.20 01/21/2023    MONOSABS 0.50 01/21/2023    NEUTROABS 1.90 01/21/2023    EOSABS 0.40 01/21/2023    BASOSABS 0.00 01/21/2023         DIAGNOSTIC TESTING:     CTA CHEST ABDOMEN PELVIS W CONTRAST    Result Date: 04/17/2023  EXAMINATION: CTA OF THE CHEST, ABDOMEN AND PELVIS WITH CONTRAST 04/17/2023 12:22 pm: TECHNIQUE: CTA of the chest, abdomen and pelvis was performed after the administration of intravenous contrast.  Multiplanar reformatted images are provided for review.  MIP images are provided for review. Automated exposure control, iterative reconstruction, and/or weight based adjustment of the mA/kV was utilized to reduce the radiation dose to as low as reasonably achievable. COMPARISON: None. HISTORY: ORDERING SYSTEM PROVIDED HISTORY: eval for dissection TECHNOLOGIST PROVIDED HISTORY: eval for dissection Additional Contrast?->1 Reason for Exam: eval for dissection; abd pain FINDINGS: CTA: No evidence of aortic dissection or intramural  hematoma.  No aneurysm. Bovine arch anatomy.  No high-grade stenosis seen in the great vessels. The abdominal aorta is normal course and caliber without evidence of aneurysm.  The celiac, SMA and IMA are patent.  A single right and a single left renal artery.  No significant atherosclerotic disease. Bilateral common, external internal areas along with bilateral common femoral bifurcations are within normal limits. CHEST: Lines and tubes: None Mediastinum and Hilum: No enlarged lymph nodes Heart and Vasculature: Not enlarged Pleura: No effusions Lungs and airways: Clear ABDOMEN/PELVIS: EG junction, stomach and duodenal sweep: Unremarkable Liver: Unremarkable Gallbladder: Surgically absent Biliary tree: Unremarkable Pancreas: Unremarkable for patient's age. Spleen: Unremarkable Kidneys and ureters: Unremarkable Adrenal glands: Unremarkable Retroperitoneal structures:  Unremarkable Small bowel and colon: No evidence of bowel obstruction.  Enlarged mesenteric mass measuring 2 cm, similar to prior examination. Appendix: Not seen Urinary bladder: Unremarkable Free fluid/air: None Lymph nodes: No enlarged lymph nodes Osseus structures: Stable sclerotic lesion left iliac bone likely relates to a bone island. Vasculature: No aneurysm Other: None     1. No evidence of dissection. 2. Stable mesenteric mass concerning for carcinoid.          PHYSICAL EXAMINATION: Vital signs reviewed per the nursing documentation.     BP 113/79   Pulse 72   Temp 97.3 F (36.3 C) (Tympanic)   Resp 16   Wt 94.8 kg (209 lb)   SpO2 98%   BMI 35.87 kg/m   Body mass index is 35.87 kg/m.   Physical Exam  Constitutional:       General: She is not in acute distress.     Appearance: She is not ill-appearing.   HENT:      Head: Normocephalic.      Mouth/Throat:      Lips: Pink.      Mouth: Mucous membranes are moist. No oral lesions.   Eyes:      General: No scleral icterus.     Pupils: Pupils are equal, round, and reactive to light.    Cardiovascular:      Rate and Rhythm: Normal rate and regular rhythm.      Pulses: No decreased pulses.      Heart sounds: No murmur heard.  Pulmonary:      Effort: Pulmonary effort is normal.      Breath sounds: Normal breath sounds.   Abdominal:      General: Bowel sounds are normal. There is no distension.      Palpations: Abdomen is soft. There is no fluid wave, hepatomegaly, splenomegaly or mass.  Tenderness: There is no abdominal tenderness. There is no guarding. Negative signs include Murphy's sign.   Musculoskeletal:         General: No swelling.      Cervical back: Neck supple.      Right lower leg: No edema.      Left lower leg: No edema.   Lymphadenopathy:      Upper Body:      Right upper body: No supraclavicular adenopathy.   Skin:     General: Skin is warm and dry.      Capillary Refill: Capillary refill takes less than 2 seconds.      Coloration: Skin is not jaundiced.      Findings: No ecchymosis or rash.   Neurological:      Mental Status: She is alert and oriented to person, place, and time.   Psychiatric:         Attention and Perception: Attention normal.         Mood and Affect: Mood is not anxious or depressed.         Speech: Speech is not rapid and pressured.         Behavior: Behavior is not agitated. Behavior is cooperative.         Cognition and Memory: Cognition is not impaired. Memory is not impaired.           IMPRESSION: Ms. Frace is a 47 y.o. female with    Diagnosis Orders   1. Carcinoid tumor of small intestine, unspecified location, unspecified whether malignant  Celiac Disease Panel    MRI ABDOMEN W WO CONTRAST MRCP    Hepatitis A Antibody, Total    Hepatitis B Core Antibody, Total    Hepatitis C Antibody    Smooth Muscle Antibody Quant    ANA    Alpha-1-Antitrypsin w Phenotype    AFP Tumor Marker    Angiotensin Converting Enzyme    Comprehensive Metabolic Panel with Bilirubin    MITOCHONDRIAL ANTIBODIES, M2, IGG    Hepatitis B Surface Antibody    Iron and TIBC     Protime-INR    Hepatitis B Surface Antigen    diphenoxylate-atropine (LOMOTIL) 2.5-0.025 MG per tablet      2. Diarrhea, unspecified type  Celiac Disease Panel    Hepatitis A Antibody, Total    Hepatitis B Core Antibody, Total    Hepatitis C Antibody    Smooth Muscle Antibody Quant    ANA    Hepatitis B Surface Antigen    diphenoxylate-atropine (LOMOTIL) 2.5-0.025 MG per tablet      3. LFT elevation  Celiac Disease Panel    MRI ABDOMEN W WO CONTRAST MRCP    Hepatitis A Antibody, Total    Hepatitis B Core Antibody, Total    Hepatitis C Antibody    Smooth Muscle Antibody Quant    ANA    Alpha-1-Antitrypsin w Phenotype    AFP Tumor Marker    Angiotensin Converting Enzyme    Comprehensive Metabolic Panel with Bilirubin    MITOCHONDRIAL ANTIBODIES, M2, IGG    Hepatitis B Surface Antibody    Iron and TIBC    Protime-INR    Hepatitis B Surface Antigen      4. Right upper quadrant abdominal pain  Celiac Disease Panel    MRI ABDOMEN W WO CONTRAST MRCP    Hepatitis A Antibody, Total    Hepatitis B Core Antibody, Total    Hepatitis C Antibody    Smooth Muscle Antibody  Quant    ANA    Alpha-1-Antitrypsin w Phenotype    AFP Tumor Marker    Angiotensin Converting Enzyme    Comprehensive Metabolic Panel with Bilirubin    MITOCHONDRIAL ANTIBODIES, M2, IGG    Hepatitis B Surface Antibody    Iron and TIBC    Protime-INR    Hepatitis B Surface Antigen      5. Pancreatic insufficiency        6. Pancreatic divisum            Lomotil prn for diarrhea  MRI liver to look for any liver involvement/carcinoid syndrome  Liver workup now that LFTs are newly elevated  Will also check celiac since pain/diarrhea is seemingly worse after eating bread  Continue creon  Keep all f/u Shahid  PET due Dec    F/u 3 months or sooner as needed    Medication where reviewed, side effects from the GI medication were reviewed with the patient  We did refill the GI medications            Diet/life style/natural hx /complication of the dx were all explained in  details   Past medical, past surgical, social history, psychiatric history, medications or allergies, all reviewed and  updated    Thank you for allowing me to participate in the care of Ms. Heishman. For any further questions please do not hesitate to contact me.    I have reviewed and agree with the ROS entered by the MA/RN.     Note is dictated utilizing voice recognition software. Unfortunately this leads to occasional typographical errors. Please contact our office if you have any questions.  This note is created with the assistance of the speech recognition program. While intending to generate a document that actually reflects the content of the visit, it can still have some errors including those of syntax and sound-a-like substitutions which may escape proof reading. Actual meaning can be extrapolated by contextual inference.    Marcia Brash, MD  [facility]  O: [phone]

## 2023-05-05 LAB — COMPREHENSIVE METABOLIC PANEL WITH BILIRUBIN
ALT: 31 U/L (ref 10–35)
AST: 22 U/L (ref 10–35)
Albumin/Globulin Ratio: 2 (ref 1.0–2.5)
Albumin: 4.3 g/dL (ref 3.5–5.2)
Alkaline Phosphatase: 119 U/L — ABNORMAL HIGH (ref 35–104)
Anion Gap: 9 mmol/L (ref 9–16)
BUN: 15 mg/dL (ref 6–20)
Bilirubin, Direct: <0.1 mg/dL (ref 0.0–0.2)
CO2: 25 mmol/L (ref 20–31)
Calcium: 10.5 mg/dL — ABNORMAL HIGH (ref 8.6–10.4)
Chloride: 104 mmol/L (ref 98–107)
Creatinine: 0.8 mg/dL (ref 0.50–0.90)
Est, Glom Filt Rate: >90 mL/min/{1.73_m2} (ref >60)
Glucose: 89 mg/dL (ref 74–99)
Potassium: 4.3 mmol/L (ref 3.7–5.3)
Sodium: 138 mmol/L (ref 136–145)
Total Bilirubin: <0.2 mg/dL (ref 0.00–1.20)
Total Protein: 7.1 g/dL (ref 6.6–8.7)

## 2023-05-05 LAB — IRON AND TIBC
Iron % Saturation: 13 % — ABNORMAL LOW (ref 20–55)
Iron: 51 ug/dL (ref 37–145)
TIBC: 379 ug/dL (ref 250–450)
UIBC: 328 ug/dL (ref 112–347)

## 2023-05-05 LAB — HEPATITIS B SURFACE ANTIGEN: Hepatitis B Surface Ag: NONREACTIVE

## 2023-05-05 LAB — HEPATITIS A ANTIBODY, TOTAL: Hep A Total Ab: NONREACTIVE

## 2023-05-05 LAB — HEPATITIS C ANTIBODY: Hepatitis C Ab: NONREACTIVE

## 2023-05-05 LAB — HEPATITIS B SURFACE ANTIBODY: Hep B S Ab: <3.5 m[IU]/mL (ref <10)

## 2023-05-05 LAB — AFP TUMOR MARKER: AFP (Alpha Fetoprotein): 7.9 ug/L (ref <8.4)

## 2023-05-05 LAB — HEPATITIS B CORE ANTIBODY, TOTAL: Hep B Core Total Ab: NONREACTIVE

## 2023-05-05 MED ORDER — FAMOTIDINE 40 MG PO TABS
40 MG | ORAL_TABLET | Freq: Every evening | ORAL | 3 refills | Status: DC
Start: 2023-05-05 — End: 2023-08-28
  Filled 2023-05-05: qty 30, 30d supply, fill #0

## 2023-05-05 NOTE — Telephone Encounter (Signed)
-----   Message from [place], [place] sent at 05/05/2023  9:17 AM EDT -----  Needs to start oral iron supplement, 1 per day

## 2023-05-05 NOTE — Telephone Encounter (Signed)
Left message advising patient need to start oral iron supplements due to lab results. Informed patient to call office with questions or concerns.

## 2023-05-06 ENCOUNTER — Encounter
Admit: 2023-05-06 | Discharge: 2023-05-06 | Payer: PRIVATE HEALTH INSURANCE | Attending: Nurse Practitioner | Primary: Nurse Practitioner

## 2023-05-06 DIAGNOSIS — D3A019 Benign carcinoid tumor of the small intestine, unspecified portion: Secondary | ICD-10-CM

## 2023-05-06 LAB — MITOCHONDRIAL ANTIBODIES, M2, IGG: Mitochondrial Ab: 0.6 U/mL (ref 0.0–4.0)

## 2023-05-06 LAB — CELIAC DISEASE PANEL
Gliadin Deaminidated Peptide AB IGA: 0.6 U/mL (ref <7.0)
Gliadin Deaminidated Peptide AB IGG: <0.4 U/mL (ref <7.0)
IgA: 176 mg/dL (ref 70–400)
Tissue Transglutaminase IgA: 0.3 U/mL (ref <7.0)

## 2023-05-06 NOTE — Progress Notes (Signed)
[facility]  [facility] PR[facility]  [address]  [address]  [place] [address]  Dept: [phone]  Dept Fax: [phone]    Monica Wood is a 47 y.o. female who presents today for her medical conditions/complaintsas noted below.  Monica Wood is c/o of 3 Month Follow-Up (Abdominal pain), Bleeding/Bruising (Possible bruise on roof of mouth), and Letter for School/Work (Needs a note to allow her to wear sneakers at work due to back/hip pain)    HPI:     Patient presents for a follow-up  Blood pressure stable  Weight is stable    Patient presents for a follow-up  Things are going ok. Still having abdominal pain.   Week before her injection is really tough.   Following with gi.   F/u soon with oncology.  She is getting injections    C/o area on roof of mouth. Noticed about a month ago. Admits to eating a lot of cough drops for a period of time. Unsure if this started it. Not going away. Not really better or worse.  No pain.  She has not seen her dentist for this.    Needs note for work to be able to wear tennis shoes to work due to her back and hip pain.  She does have inserts for her shoes but often cannot wear them in dress shoes.  She does have to dress business casual.    Hep b vaccine needed due to nonreactive titers    Appt with derm in a few weeks.  Mild improvement with ketoconazole to the rash on her chest.  She does have a mole on her chest that she would like to have evaluated        Past Medical History:   Diagnosis Date    Bursitis     Carcinoid tumor     Fibromyalgia     Scoliosis     Tendinitis       Past Surgical History:   Procedure Laterality Date    ABDOMINOPLASTY      COLONOSCOPY N/A 11/18/2022    COLONOSCOPY POLYPECTOMY SNARE BIOPSY performed by Marcia Brash, MD at [facility]    ESOPHAGOGASTRIC FUNDOPLICATION  2000    GALLBLADDER SURGERY      LAPAROTOMY N/A 11/19/2022    LAPAROTOMY EXPLORATORY.  SMALL BOWEL RESECTION.  INCISIONAL BIOPSY OF MESENTERIC LYMPH  NODE performed by Lucious Groves, MD at [facility] OR    TUBAL LIGATION      UPPER GASTROINTESTINAL ENDOSCOPY N/A 11/18/2022    ESOPHAGOGASTRODUODENOSCOPY BIOPSY performed by Marcia Brash, MD at [facility] [facility]       Family History   Problem Relation Age of Onset    Diabetes Mother     Diabetes Maternal Grandmother        Social History     Tobacco Use    Smoking status: Never    Smokeless tobacco: Never   Substance Use Topics    Alcohol use: Not Currently     Comment: 3/4 times a week      Current Outpatient Medications   Medication Sig Dispense Refill    Lisdexamfetamine Dimesylate 40 MG CAPS Take 40 mg by mouth daily. Max Daily Amount: 40 mg      famotidine (PEPCID) 40 MG tablet Take 1 tablet by mouth every evening 30 tablet 3    ketoconazole (NIZORAL) 2 % cream Apply 1 Applicatorful topically 2 times daily Apply topically BID for 2-4 weeks 60  g 3    lipase-protease-amylase (CREON) 36000-114000 units CPEP delayed release capsule Take 1 capsule by mouth 3 times daily (with meals) 90 capsule 5    senna (SENOKOT) 8.6 MG tablet Take 1 tablet by mouth 2 times daily 60 tablet 11    DULoxetine (CYMBALTA) 60 MG extended release capsule take 1 capsule by mouth once daily 90 capsule 1    albuterol sulfate HFA (PROVENTIL HFA) 108 (90 Base) MCG/ACT inhaler Inhale 2 puffs into the lungs every 6 hours as needed for Wheezing or Shortness of Breath 18 g 0    DULoxetine (CYMBALTA) 30 MG extended release capsule Take 1 capsule by mouth daily 90 capsule 1    buPROPion (WELLBUTRIN XL) 150 MG extended release tablet Take 1 tablet by mouth every morning 90 tablet 1    tiZANidine (ZANAFLEX) 2 MG tablet Take 1 tablet by mouth daily as needed (back muscle) 30 tablet 1    diphenoxylate-atropine (LOMOTIL) 2.5-0.025 MG per tablet Take 1 tablet by mouth 4 times daily as needed for Diarrhea for up to 10 days. Max Daily Amount: 4 tablets (Patient not taking: Reported on 05/06/2023) 40 tablet 0     No current facility-administered  medications for this visit.     Allergies   Allergen Reactions    Oxycodone Anaphylaxis    Parsley Leaves Anaphylaxis, Hives, Itching and Shortness Of Breath    Percocet [Oxycodone-Acetaminophen] Shortness Of Breath    Codeine      Anaphylaxis     Morphine        Health Maintenance   Topic Date Due    DTaP/Tdap/Td vaccine (1 - Tdap) Never done    Flu vaccine (1) Never done    COVID-19 Vaccine (1 - 2023-24 season) Never done    Hepatitis B vaccine (2 of 3 - 19+ 3-dose series) 06/03/2023    Depression Monitoring  10/29/2023    Breast cancer screen  04/23/2024    Cervical cancer screen  05/28/2025    Lipids  11/06/2025    Colorectal Cancer Screen  11/17/2032    Hepatitis A vaccine  Aged Out    Hib vaccine  Aged Out    Polio vaccine  Aged Out    Meningococcal (ACWY) vaccine  Aged Out    Pneumococcal 0-64 years Vaccine  Aged Out    Depression Screen  Discontinued    Diabetes screen  Discontinued    Hepatitis C screen  Discontinued    HIV screen  Discontinued       :     Review of Systems   Constitutional:  Negative for chills, fatigue and fever.   HENT:  Negative for ear discharge, ear pain, sinus pressure, sinus pain, sore throat and trouble swallowing.    Eyes:  Negative for discharge, redness and itching.   Respiratory:  Negative for cough, chest tightness, shortness of breath and wheezing.    Cardiovascular:  Negative for chest pain.   Gastrointestinal:  Positive for abdominal pain and diarrhea. Negative for nausea and vomiting.   Genitourinary:  Negative for difficulty urinating.   Musculoskeletal:  Negative for arthralgias and neck pain.   Skin:  Positive for rash.   Neurological:  Negative for dizziness, weakness, light-headedness and headaches.   All other systems reviewed and are negative.      Objective:     Physical Exam  Constitutional:       General: She is not in acute distress.     Appearance: Normal appearance. She is  normal weight. She is not ill-appearing.   HENT:      Head: Normocephalic and  atraumatic.      Nose: Nose normal. No congestion or rhinorrhea.      Mouth/Throat:      Mouth: Mucous membranes are moist.      Pharynx: Oropharynx is clear. No oropharyngeal exudate or posterior oropharyngeal erythema.   Eyes:      Extraocular Movements: Extraocular movements intact.      Conjunctiva/sclera: Conjunctivae normal.      Pupils: Pupils are equal, round, and reactive to light.   Cardiovascular:      Rate and Rhythm: Normal rate and regular rhythm.      Pulses: Normal pulses.      Heart sounds: Normal heart sounds. No murmur heard.  Pulmonary:      Effort: Pulmonary effort is normal. No respiratory distress.      Breath sounds: Normal breath sounds. No wheezing.   Abdominal:      General: Abdomen is flat. Bowel sounds are normal. There is no distension.      Palpations: Abdomen is soft.      Tenderness: There is no abdominal tenderness. There is no guarding.   Musculoskeletal:         General: Normal range of motion.      Cervical back: Normal range of motion and neck supple.   Skin:     General: Skin is warm and dry.      Capillary Refill: Capillary refill takes less than 2 seconds.   Neurological:      General: No focal deficit present.      Mental Status: She is alert and oriented to person, place, and time.      Motor: No weakness.      Coordination: Coordination normal.      Gait: Gait normal.   Psychiatric:         Mood and Affect: Mood normal.         Behavior: Behavior normal.         Thought Content: Thought content normal.       BP 104/74   Pulse 70   Resp 14   Wt 93.7 kg (206 lb 9.6 oz)   SpO2 99%   BMI 35.46 kg/m     Assessment:   Assessment & Plan    Diagnosis Orders   1. Carcinoid tumor of small intestine, unspecified location, unspecified whether malignant        2. Need for hepatitis B vaccination  Hep B, ENGERIX-B, (age 60 yrs+), IM, 1mL, 3-dose      3. Skin lesion        4. Mouth lesion            :      Return in about 3 months (around 08/06/2023) for physical .  1.  Carcinoid  tumor  Patient is following with GI and oncology.  She does get injections.  The week before her injection is often the worst for her.  She was given the medication to help.  2.  Need for hep B vaccine  Will give hep B vaccine today  3.  Skin lesion  Patient has an appointment with dermatology in a few weeks  4.  Mouth lesion  Photo uploaded for documentation purposes.  Discussed follow-up with her dentist    Follow-up in 3 months or sooner as needed  Orders Placed This Encounter   Procedures    Hep B, ENGERIX-B, (age  20 yrs+), IM, 1mL, 3-dose     No orders of the defined types were placed in this encounter.      Patient given educational materials - seepatient instructions.  Discussed use, benefit, and side effects of prescribed medications.All patient questions answered.  Pt voiced understanding. Reviewed health maintenance.Instructed to continue current medications, diet and exercise.  Patient agreedwith treatment plan. Follow up as directed.      Electronically signed by Levander Campion, APRN - CNP on 10/23/2024at 1:40 PM

## 2023-05-06 NOTE — Progress Notes (Signed)
After obtaining consent, and per orders of Levander Campion, CNP, injection of Engerix-B given in Right deltoid by Roena Malady, MA. Patient instructed to remain in clinic for 20 minutes afterwards, and to report any adverse reaction to me immediately.

## 2023-05-07 LAB — SMOOTH MUSCLE ANTIBODY QUANT: Smooth Muscle Ab: 12 U (ref 0–19)

## 2023-05-07 LAB — ANGIOTENSIN CONVERTING ENZYME: Angio Convert Enzyme: 86 U/L — ABNORMAL HIGH (ref 16–85)

## 2023-05-08 LAB — ANA
ANA: NEGATIVE
Anti ds DNA: 0.8 [IU]/mL (ref <10.0)
ENA Antibodies Screen: 0.2 U/mL (ref <0.7)

## 2023-05-08 LAB — ALPHA-1-ANTITRYPSIN W/ PHENOTYPE: A-1 Antitrypsin: 138 mg/dL (ref 90–200)

## 2023-05-15 ENCOUNTER — Inpatient Hospital Stay: Admit: 2023-05-15 | Discharge: 2023-05-15 | Payer: PRIVATE HEALTH INSURANCE | Primary: Nurse Practitioner

## 2023-05-15 ENCOUNTER — Ambulatory Visit
Admit: 2023-05-15 | Discharge: 2023-05-15 | Payer: PRIVATE HEALTH INSURANCE | Attending: Internal Medicine | Primary: Nurse Practitioner

## 2023-05-15 DIAGNOSIS — D3A019 Benign carcinoid tumor of the small intestine, unspecified portion: Secondary | ICD-10-CM

## 2023-05-15 LAB — CBC WITH AUTO DIFFERENTIAL
Basophils %: 1 % (ref 0–2)
Basophils Absolute: 0 10*3/uL (ref 0.0–0.2)
Eosinophils %: 2 % (ref 1–4)
Eosinophils Absolute: 0.1 10*3/uL (ref 0.0–0.4)
Hematocrit: 37.2 % (ref 36–46)
Hemoglobin: 11.6 g/dL — ABNORMAL LOW (ref 12.0–16.0)
Lymphocytes %: 39 % (ref 24–44)
Lymphocytes Absolute: 1.9 10*3/uL (ref 1.0–4.8)
MCH: 22.9 pg — ABNORMAL LOW (ref 26–34)
MCHC: 31.1 g/dL (ref 31–37)
MCV: 73.6 fL — ABNORMAL LOW (ref 80–100)
MPV: 7.8 fL (ref 6.0–12.0)
Monocytes %: 11 % (ref 2–11)
Monocytes Absolute: 0.5 10*3/uL (ref 0.1–1.2)
Neutrophils %: 47 % (ref 36–66)
Neutrophils Absolute: 2.3 10*3/uL (ref 1.8–7.7)
Platelets: 316 10*3/uL (ref 140–450)
RBC: 5.06 m/uL (ref 4.0–5.2)
RDW: 15.2 % (ref 12.5–15.4)
WBC: 4.9 10*3/uL (ref 3.5–11.0)

## 2023-05-15 LAB — COMPREHENSIVE METABOLIC PANEL
ALT: 39 U/L — ABNORMAL HIGH (ref 5–33)
AST: 30 U/L (ref <32)
Albumin/Globulin Ratio: 1.5 (ref 1.0–2.5)
Albumin: 4.1 g/dL (ref 3.5–5.2)
Alkaline Phosphatase: 117 U/L — ABNORMAL HIGH (ref 35–104)
Anion Gap: 7 mmol/L — ABNORMAL LOW (ref 9–17)
BUN: 15 mg/dL (ref 6–20)
CO2: 26 mmol/L (ref 20–31)
Calcium: 9.8 mg/dL (ref 8.6–10.4)
Chloride: 109 mmol/L — ABNORMAL HIGH (ref 98–107)
Creatinine: 0.8 mg/dL (ref 0.5–0.9)
Est, Glom Filt Rate: >90 mL/min/{1.73_m2} (ref >60)
Glucose: 100 mg/dL — ABNORMAL HIGH (ref 70–99)
Potassium: 4.1 mmol/L (ref 3.7–5.3)
Sodium: 142 mmol/L (ref 135–144)
Total Bilirubin: 0.4 mg/dL (ref 0.3–1.2)
Total Protein: 6.8 g/dL (ref 6.4–8.3)

## 2023-05-15 MED ORDER — OCTREOTIDE ACETATE 30 MG IM KIT
30 | Freq: Once | INTRAMUSCULAR | Status: AC
Start: 2023-05-15 — End: 2023-05-15
  Administered 2023-05-15: 18:00:00 30 mg via INTRAMUSCULAR

## 2023-05-15 MED ORDER — OCTREOTIDE ACETATE 20 MG IM KIT
20 | Freq: Once | INTRAMUSCULAR | Status: DC
Start: 2023-05-15 — End: 2023-05-15

## 2023-05-15 MED FILL — SANDOSTATIN LAR DEPOT 30 MG IM KIT: 30 MG | INTRAMUSCULAR | Qty: 1

## 2023-05-15 MED FILL — SANDOSTATIN LAR DEPOT 20 MG IM KIT: 20 MG | INTRAMUSCULAR | Qty: 1

## 2023-05-15 NOTE — Progress Notes (Signed)
Monica Wood                                                                                                                  05/15/2023  MRN:   [id number]  Date of Birth:  [birth year]  PCP:                           Levander Campion, APRN - CNP  Referring Physician: No ref. 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  Patient is complaining of some abdominal pain on the right upper quadrant.  Was seen by GI.  She is going to have an MRI of the abdomen tomorrow.  Diarrhea was not as bad with last treatment.  Patient underwent speech rehab.  Patient is mostly stressed with all the ongoing medical issues    During this visit patient's allergy, social, medical, surgical history and medications were reviewed and updated.    Summary of Case/History:  Monica Wood a 47 y.o.female is a patient with unresectable well-differentiated endocrine tumor of small bowel presents to the clinic to establish care for further workup and management.  Patient complains of having abdominal pain .  Presented to the ER underwent imaging which showed soft tissue mass in the ventral mesentery anterior to the aorta concerning for neoplasm.  Patient underwent EGD and colonoscopy by GI which showed a 1 cm polyp in the colon but no other significant abnormality.  Subsequently patient was taken to the OR for exploratory laparotomy small bowel resection and lymph node dissection.  Pathology from the surgery  showed multiple well-differentiated neuroendocrine tumors largest measuring 2.8 cm in the mid small bowel resection.  There was also metastasis noted to the lymph nodes.  Patient had a grade 1 disease.  Ki-67 was 1-2%.  Pathological stage was T3 N1.  Patient is recovering from the surgery.  She has a PET scan scheduled next week.  Presents to the clinic to establish care and for further workup.  Patient also noted to have microcytic anemia.  Iron studies done earlier show adequate iron stores.  Patient also complains of hoarseness of voice which has been going on for a while.    Past Medical History:   Past Medical History:   Diagnosis Date    Bursitis     Carcinoid tumor  Fibromyalgia     Scoliosis     Tendinitis      Past Surgical History:  Past Surgical History:   Procedure Laterality Date    ABDOMINOPLASTY      COLONOSCOPY N/A 11/18/2022    COLONOSCOPY POLYPECTOMY SNARE BIOPSY performed by Marcia Brash, MD at [facility]    ESOPHAGOGASTRIC FUNDOPLICATION  2000    GALLBLADDER SURGERY      LAPAROTOMY N/A 11/19/2022    LAPAROTOMY EXPLORATORY.  SMALL BOWEL RESECTION.  INCISIONAL BIOPSY OF MESENTERIC LYMPH NODE performed by Lucious Groves, MD at [facility] OR    TUBAL LIGATION      UPPER GASTROINTESTINAL ENDOSCOPY N/A 11/18/2022    ESOPHAGOGASTRODUODENOSCOPY BIOPSY performed by Marcia Brash, MD at [facility] [place] [facility]     Patient Family Social History:  Family History   Problem Relation Age of Onset    Diabetes Mother     Diabetes Maternal Grandmother      Social History     Tobacco Use    Smoking status: Never    Smokeless tobacco: Never   Substance Use Topics    Alcohol use: Not Currently     Comment: 3/4 times a week    Drug use: Yes     Types: Marijuana Sheran Fava)     Comment: uses gummies periodically for pain control     Current Medications:  Current Outpatient Medications   Medication Sig Dispense Refill    Lisdexamfetamine Dimesylate 40 MG CAPS Take 40 mg by mouth daily. Max Daily Amount: 40 mg       famotidine (PEPCID) 40 MG tablet Take 1 tablet by mouth every evening 30 tablet 3    ketoconazole (NIZORAL) 2 % cream Apply 1 Applicatorful topically 2 times daily Apply topically BID for 2-4 weeks 60 g 3    lipase-protease-amylase (CREON) 36000-114000 units CPEP delayed release capsule Take 1 capsule by mouth 3 times daily (with meals) 90 capsule 5    senna (SENOKOT) 8.6 MG tablet Take 1 tablet by mouth 2 times daily 60 tablet 11    DULoxetine (CYMBALTA) 60 MG extended release capsule take 1 capsule by mouth once daily 90 capsule 1    albuterol sulfate HFA (PROVENTIL HFA) 108 (90 Base) MCG/ACT inhaler Inhale 2 puffs into the lungs every 6 hours as needed for Wheezing or Shortness of Breath 18 g 0    DULoxetine (CYMBALTA) 30 MG extended release capsule Take 1 capsule by mouth daily 90 capsule 1    buPROPion (WELLBUTRIN XL) 150 MG extended release tablet Take 1 tablet by mouth every morning 90 tablet 1    tiZANidine (ZANAFLEX) 2 MG tablet Take 1 tablet by mouth daily as needed (back muscle) 30 tablet 1     No current facility-administered medications for this visit.     Facility-Administered Medications Ordered in Other Visits   Medication Dose Route Frequency Provider Last Rate Last Admin    octreotide ACETATE (SANDOSTATIN LAR) injection 20 mg  20 mg IntraMUSCular Once Dallan Schonberg, Nelva Nay, MD         Allergies:   Oxycodone, Parsley leaves, Percocet [oxycodone-acetaminophen], Codeine, and Morphine    Review of Systems:    Constitutional: No fever or chills. No night sweats, no weight loss   Eyes: No eye discharge, double vision, or eye pain   HEENT: negative for sore mouth, sore throat, hoarseness and voice change   Respiratory: negative for cough , sputum, dyspnea, wheezing, hemoptysis, chest pain   Cardiovascular: negative for chest  pain, dyspnea, palpitations, orthopnea, PND   Gastrointestinal: negative for nausea, vomiting, diarrhea, constipation, abdominal pain, Dysphagia, hematemesis and hematochezia.  Positive for  abdominal discomfort  Genitourinary: negative for frequency, dysuria, nocturia, urinary incontinence, and hematuria   Integument: negative for rash, skin lesions, bruises.   Hematologic/Lymphatic: negative for easy bruising, bleeding, lymphadenopathy, or petechiae   Endocrine: negative for heat or cold intolerance,weight changes, change in bowel habits and hair loss   Musculoskeletal: negative for myalgias, arthralgias, pain, joint swelling,and bone pain   Neurological: negative for headaches, dizziness, seizures, weakness, numbness    Physical Exam:  Vitals: BP (!) 99/54   Pulse 59   Temp 96.8 F (36 C) (Temporal)   Resp 18   Wt 95.2 kg (209 lb 14.4 oz)   SpO2 97%   BMI 36.03 kg/m   General appearance - well appearing, no in pain or distress  Mental status - AAO X3  Eyes - pupils equal and reactive, extraocular eye movements intact  Mouth - mucous membranes moist, pharynx normal without lesions  Neck - supple, no significant adenopathy  Lymphatics - no palpable lymphadenopathy, no hepatosplenomegaly  Chest - clear to auscultation, no wheezes, rales or rhonchi, symmetric air entry  Heart - normal rate, regular rhythm, normal S1, S2, no murmurs  Abdomen - soft, nontender, nondistended, no masses or organomegaly.  Positive healing surgical incision site  Neurological - alert, oriented, normal speech, no focal findings or movement disorder noted  Extremities - peripheral pulses normal, no pedal edema, no clubbing or cyanosis  Skin - normal coloration and turgor, no rashes, no suspicious skin lesions noted       DATA:  Results for orders placed or performed during the hospital encounter of 05/04/23   Celiac Disease Panel   Result Value Ref Range    Gliadin Deaminidated Peptide AB IGA 0.6 <7.0 U/mL    Gliadin Deaminidated Peptide AB IGG <0.4 <7.0 U/mL    IgA 176 70 - 400 mg/dL    Tissue Transglutaminase IgA 0.3 <7.0 U/mL   Hepatitis A Antibody, Total   Result Value Ref Range    Hep A Total Ab NONREACTIVE  NONREACTIVE   Hepatitis B Core Antibody, Total   Result Value Ref Range    Hep B Core Total Ab NONREACTIVE NONREACTIVE   Hepatitis C Antibody   Result Value Ref Range    Hepatitis C Ab NONREACTIVE NONREACTIVE   Smooth Muscle Antibody Quant   Result Value Ref Range    Smooth Muscle Ab 12 0 - 19 Units   ANA   Result Value Ref Range    ANA NEGATIVE NEGATIVE    ENA Antibodies Screen 0.2 <0.7 U/mL    Anti ds DNA 0.8 <10.0 IU/mL   Alpha-1-Antitrypsin w Phenotype   Result Value Ref Range    A-1 Antitrypsin 138 90 - 200 mg/dL    A-1 Antitrypsin Pheno M1M1    AFP Tumor Marker   Result Value Ref Range    AFP (Alpha Fetoprotein) 7.9 <8.4 ug/L   Angiotensin Converting Enzyme   Result Value Ref Range    Angio Convert Enzyme 86 (H) 16 - 85 U/L   Comprehensive Metabolic Panel with Bilirubin   Result Value Ref Range    Sodium 138 136 - 145 mmol/L    Potassium 4.3 3.7 - 5.3 mmol/L    Chloride 104 98 - 107 mmol/L    CO2 25 20 - 31 mmol/L    Anion Gap 9 9 - 16 mmol/L  Glucose 89 74 - 99 mg/dL    BUN 15 6 - 20 mg/dL    Creatinine 0.8 8.46 - 0.90 mg/dL    Est, Glom Filt Rate >90 >60 mL/min/1.57m2    Calcium 10.5 (H) 8.6 - 10.4 mg/dL    Total Protein 7.1 6.6 - 8.7 g/dL    Albumin 4.3 3.5 - 5.2 g/dL    Albumin/Globulin Ratio 2.0 1.0 - 2.5    Total Bilirubin <0.2 0.00 - 1.20 mg/dL    Bilirubin, Direct <9.6 0.0 - 0.2 mg/dL    Bilirubin, Indirect Can not be calculated 0.0 - 1.0 mg/dL    Alkaline Phosphatase 119 (H) 35 - 104 U/L    ALT 31 10 - 35 U/L    AST 22 10 - 35 U/L   MITOCHONDRIAL ANTIBODIES, M2, IGG   Result Value Ref Range    Mitochondrial Ab 0.6 0.0 - 4.0 U/mL   Hepatitis B Surface Antibody   Result Value Ref Range    Hep B S Ab <3.50 <10 mIU/mL   Iron and TIBC   Result Value Ref Range    Iron 51 37 - 145 ug/dL    TIBC 295 284 - 132 ug/dL    Iron % Saturation 13 (L) 20 - 55 %    UIBC 328 112 - 347 ug/dL   Protime-INR   Result Value Ref Range    Protime <9.0 (L) 9.4 - 12.6 sec    INR <0.8    Hepatitis B Surface Antigen   Result  Value Ref Range    Hepatitis B Surface Ag NONREACTIVE NONREACTIVE     XR HIP BILATERAL W AP PELVIS (2 VIEWS)    Result Date: 12/31/2022  X-rays taken in clinic today and preliminarily reviewed by me 12/25/22: AP pelvis and lateral view of both hips demonstrate some very early degenerative changes/spur formation with overall maintenance of from acetabular joint space.  There is no evidence of acute fracture, obvious AVN or femoral head flattening.      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  Reviewed results of available clinical data  Continue treatment with octreotide.  Will increase dose to 30 mg every 4 week to see if it helps with the diarrhea.  In the meanwhile continue symptom management of the diarrhea  Patient will start taking oral iron for microcytic anemia with iron deficiency  Follow-up on results of MRI abdomen  Plan to obtain dotatate CT PET before end of the year to assess disease status  NCCN guidelines were reviewed and discussed with the patient.  Hemoglobin electrophoresis unremarkable however alpha thalassemia not ruled out  The diagnosis and care plan were discussed with the patient in detail. I discussed the natural history of the disease, prognosis, risks and goals of therapy and answered all the patients questions to the best of my ability.  Patient expressed understanding and was in agreement.      Prudence Davidson, MD      This note is created with the assistance of a speech recognition program.  While intending to generate a document that actually reflects the content of the visit, the document can still have some errors including those of syntax and sound a like substitutions which may escape  proof reading.  It  such instances, actual meaning can be extrapolated by contextual diversion.

## 2023-05-15 NOTE — Telephone Encounter (Signed)
Instructions   from Dr. Prudence Davidson, MD    Increase octreotide to 30 , already [facility]   they are working on it , please check with them      Rv in 4 weeks         Octreotide 30, pt to come back at 1 pm today  RV 06/19/23 at 8:30 am with tx to follow (4 weeks office is closed for the holiday, date was what patient requested)

## 2023-05-15 NOTE — Progress Notes (Signed)
Pt. Arrived for sandostatin  Seen by Dr.Shahid et dose increased to 30mg   Pt. Returned at 1300 after medication obtained  Injection given   Disch stable no complaints  Return 06/19/23 appt et sandostatin

## 2023-05-15 NOTE — Patient Instructions (Signed)
Increase octreotide to 30 , already [facility]   they are working on it , please check with them     Rv in 4 weeks

## 2023-05-16 ENCOUNTER — Inpatient Hospital Stay: Admit: 2023-05-16 | Payer: PRIVATE HEALTH INSURANCE | Primary: Nurse Practitioner

## 2023-05-16 DIAGNOSIS — D3A019 Benign carcinoid tumor of the small intestine, unspecified portion: Secondary | ICD-10-CM

## 2023-05-16 MED ORDER — GADOTERIDOL 279.3 MG/ML IV SOLN
279.3 | Freq: Once | INTRAVENOUS | Status: AC | PRN
Start: 2023-05-16 — End: 2023-05-16
  Administered 2023-05-16: 12:00:00 19 mL via INTRAVENOUS

## 2023-05-16 MED ORDER — NORMAL SALINE FLUSH 0.9 % IV SOLN
0.9 | INTRAVENOUS | Status: DC | PRN
Start: 2023-05-16 — End: 2023-05-19
  Administered 2023-05-16: 12:00:00 10 mL via INTRAVENOUS

## 2023-05-16 MED ORDER — SODIUM CHLORIDE 0.9 % IV BOLUS
0.9 | Freq: Once | INTRAVENOUS | Status: AC
Start: 2023-05-16 — End: 2023-05-16
  Administered 2023-05-16: 12:00:00 50 mL via INTRAVENOUS

## 2023-05-20 ENCOUNTER — Ambulatory Visit
Admit: 2023-05-20 | Discharge: 2023-05-20 | Payer: PRIVATE HEALTH INSURANCE | Attending: Physician Assistant | Primary: Nurse Practitioner

## 2023-05-20 DIAGNOSIS — D229 Melanocytic nevi, unspecified: Secondary | ICD-10-CM

## 2023-05-20 NOTE — Progress Notes (Cosign Needed)
Dermatology Patient Note  [facility] SPECIALITY CARE, [facility]  [address]  [address]  [place] [address]  Dept: [phone]  Dept Fax: [phone]      VISITDATE: 05/20/2023   REFERRING PROVIDER: Levander Campion, APRN *      Monica Wood is a 47 y.o. female  who presents today in the office for:    New Patient (New patient presents today for a mole on her chest that she has had for years. States it has changed color, shape and peels. No hx of skin cancer. She recently diagnosed with an carcinoid  tumor in her abdomen. She also has a skin irritating skin tag on her neck. )      HISTORY OF PRESENT ILLNESS:  As above.    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    Ileus, postoperative (HCC) 11/26/2022    Esophageal dysphagia 11/18/2022    Anemia 11/17/2022    Elevated C-reactive protein (CRP) 03/27/2022    Osteoarthritis of spine with radiculopathy, lumbar region 03/27/2022    Restless leg syndrome 03/27/2022    Osteoarthritis of spine with radiculopathy, cervical region 03/27/2022    Vitamin D deficiency 03/27/2022    Change in vision 04/18/2020    Obesity (BMI 35.0-39.9 without comorbidity) 04/18/2020    Fibromyalgia 04/18/2020    Chronic bilateral low back pain without sciatica 04/18/2020    Impingement syndrome of left shoulder region 11/16/2019    Impingement syndrome of right shoulder region 11/16/2019    Trochanteric bursitis of both hips 11/16/2019    Depression 06/06/2019       CURRENT MEDICATIONS:   Current Outpatient Medications   Medication Sig Dispense Refill    Lisdexamfetamine Dimesylate 40 MG CAPS Take 40 mg by mouth daily. Max Daily Amount: 40 mg      famotidine (PEPCID) 40 MG tablet Take 1 tablet by mouth every evening 30 tablet 3    ketoconazole (NIZORAL) 2 % cream Apply 1 Applicatorful topically 2 times daily Apply topically BID for 2-4 weeks 60 g 3     lipase-protease-amylase (CREON) 36000-114000 units CPEP delayed release capsule Take 1 capsule by mouth 3 times daily (with meals) 90 capsule 5    DULoxetine (CYMBALTA) 60 MG extended release capsule take 1 capsule by mouth once daily 90 capsule 1    albuterol sulfate HFA (PROVENTIL HFA) 108 (90 Base) MCG/ACT inhaler Inhale 2 puffs into the lungs every 6 hours as needed for Wheezing or Shortness of Breath 18 g 0    DULoxetine (CYMBALTA) 30 MG extended release capsule Take 1 capsule by mouth daily 90 capsule 1    buPROPion (WELLBUTRIN XL) 150 MG extended release tablet Take 1 tablet by mouth every morning 90 tablet 1    tiZANidine (ZANAFLEX) 2 MG tablet Take 1 tablet by mouth daily as needed (back muscle) 30 tablet 1    senna (SENOKOT) 8.6 MG tablet Take 1 tablet by mouth 2 times daily (Patient not taking: Reported on 05/20/2023) 60 tablet 11     No current facility-administered medications for this visit.       ALLERGIES:   Allergies   Allergen Reactions    Oxycodone Anaphylaxis    Parsley Leaves Anaphylaxis, Hives, Itching and Shortness Of Breath    Percocet [Oxycodone-Acetaminophen] Shortness Of Breath    Codeine      Anaphylaxis  Morphine        SOCIAL HISTORY:  Social History     Tobacco Use    Smoking status: Never    Smokeless tobacco: Never   Substance Use Topics    Alcohol use: Not Currently     Comment: 3/4 times a week       Pertinent ROS:  Review of Systems  Skin: Denies any new changing, growing or bleeding lesions or rashes except as described in the HPI   Constitutional: Denies fevers, chills, and malaise.    PHYSICAL EXAM:   BP 110/70   Pulse 67   Temp 98.3 F (36.8 C)   Ht 1.645 m (5' 4.75")   Wt 94.3 kg (208 lb)   SpO2 99%   BMI 34.88 kg/m     The patient is generally well appearing, well nourished, alert and conversational. Affect is normal.    Cutaneous Exam:  Physical Exam  Sun-exposed skin: head/face, neck, both arms, digits and nails were examined.    Facial covering was removed  during examination.    Diagnoses/exam findings/medical history pertinent to this visit are listed below:    Assessment:   Diagnosis Orders   1. Benign nevus of skin        2. Inflamed acrochordon  REMOVAL OF SKIN TAGS           Plan:  1. Benign nevus of skin  - reassurance and education     2. Inflamed acrochordon  After cleaning with alcohol and anesthetizing with 0.2 mL of lidocaine 1% with epinephrine, 1 irritated and inflamed skin tag in the left neck was removed with iris scissors. Hemostasis was achieved with pressure and aluminum chloride. Vaseline and a bandage were applied.   - REMOVAL OF SKIN TAGS      RTC prn    Future Appointments   Date Time Provider Department Center   06/19/2023  8:30 AM Prudence Davidson, MD [facility]   06/19/2023  9:30 AM [facility] MED ONC CHAIR 09 [facility]   07/30/2023  8:00 AM Levander Campion, APRN - CNP [facility]   08/10/2023  4:00 PM Daboul, Isam, MD [facility]         There are no Patient Instructions on file for this visit.      Electronically signed by Otho Darner, PA-C on 05/20/23 at 2:51 PM EST

## 2023-06-03 ENCOUNTER — Encounter: Admit: 2023-06-03 | Admitting: Nurse Practitioner

## 2023-06-03 DIAGNOSIS — E349 Endocrine disorder, unspecified: Secondary | ICD-10-CM

## 2023-06-10 NOTE — ED Provider Notes (Signed)
 [facility] EMERGENCY-URGENT CARE                                                           Pt Name: Monica Wood

## 2023-06-16 MED FILL — DULOXETINE HCL 60MG CPEP: 60 60 MG | 30 days supply | Qty: 30 | Fill #0 | Status: AC

## 2023-06-16 MED FILL — KETOCONAZOLE 2% CREA: 2 2 % | 28 days supply | Qty: 60 | Fill #1 | Status: AC

## 2023-06-16 MED FILL — LISDEXAMFETAMINE DIMESYLATE 40 CAPS: 40 40 MG | 30 days supply | Qty: 30 | Fill #0 | Status: AC

## 2023-06-16 MED FILL — FAMOTIDINE 40MG TABS: 40 40 MG | 30 days supply | Qty: 30 | Fill #1 | Status: AC

## 2023-06-17 MED FILL — CREON 36000-114000UNIT CPEP: 36000-114000 36000-114000 UNIT | 33 days supply | Qty: 100 | Fill #3 | Status: AC

## 2023-06-19 ENCOUNTER — Ambulatory Visit
Admit: 2023-06-19 | Discharge: 2023-06-20 | Disposition: A | Discharge status: Home / Self Care | Payer: PRIVATE HEALTH INSURANCE | Attending: Internal Medicine | Admitting: Internal Medicine | Primary: Nurse Practitioner

## 2023-06-19 ENCOUNTER — Ambulatory Visit
Admit: 2023-06-19 | Payer: PRIVATE HEALTH INSURANCE | Attending: Internal Medicine | Admitting: Internal Medicine | Primary: Nurse Practitioner

## 2023-06-19 VITALS — BP 105/72 | HR 57 | Temp 96.90000°F | Wt 214.0 lb

## 2023-06-19 DIAGNOSIS — D3A019 Benign carcinoid tumor of the small intestine, unspecified portion: Secondary | ICD-10-CM

## 2023-06-19 LAB — CBC WITH AUTO DIFFERENTIAL
Basophils %: 1 % (ref 0–2)
Basophils Absolute: 0.1 10*3/uL (ref 0.0–0.2)
Eosinophils %: 4 % (ref 1–4)
Eosinophils Absolute: 0.2 10*3/uL (ref 0.0–0.4)
Hematocrit: 35 % — ABNORMAL LOW (ref 36–46)
Hemoglobin: 11.1 g/dL — ABNORMAL LOW (ref 12.0–16.0)
Lymphocytes %: 42 % (ref 24–44)
Lymphocytes Absolute: 2.2 10*3/uL (ref 1.0–4.8)
MCH: 23.2 pg — ABNORMAL LOW (ref 26–34)
MCHC: 31.7 g/dL (ref 31–37)
MCV: 73.4 fL — ABNORMAL LOW (ref 80–100)
MPV: 7.1 fL (ref 6.0–12.0)
Monocytes %: 10 % (ref 2–11)
Monocytes Absolute: 0.5 10*3/uL (ref 0.1–1.2)
Neutrophils %: 43 % (ref 36–66)
Neutrophils Absolute: 2.2 10*3/uL (ref 1.8–7.7)
Platelets: 327 10*3/uL (ref 140–450)
RBC: 4.77 m/uL (ref 4.0–5.2)
RDW: 15.5 % — ABNORMAL HIGH (ref 12.5–15.4)
WBC: 5.1 10*3/uL (ref 3.5–11.0)

## 2023-06-19 LAB — COMPREHENSIVE METABOLIC PANEL
ALT: 20 U/L (ref 5–33)
AST: 20 U/L (ref <32)
Albumin/Globulin Ratio: 1.5 (ref 1.0–2.5)
Albumin: 4 g/dL (ref 3.5–5.2)
Alkaline Phosphatase: 117 U/L — ABNORMAL HIGH (ref 35–104)
Anion Gap: 9 mmol/L (ref 9–17)
BUN: 11 mg/dL (ref 6–20)
CO2: 25 mmol/L (ref 20–31)
Calcium: 9.5 mg/dL (ref 8.6–10.4)
Chloride: 106 mmol/L (ref 98–107)
Creatinine: 0.9 mg/dL (ref 0.5–0.9)
Est, Glom Filt Rate: 79 mL/min/{1.73_m2} (ref >60)
Glucose: 167 mg/dL — ABNORMAL HIGH (ref 70–99)
Potassium: 4.8 mmol/L (ref 3.7–5.3)
Sodium: 140 mmol/L (ref 135–144)
Total Bilirubin: 0.4 mg/dL (ref 0.3–1.2)
Total Protein: 6.6 g/dL (ref 6.4–8.3)

## 2023-06-19 MED ORDER — OCTREOTIDE ACETATE 30 MG IM KIT
30 | Freq: Once | INTRAMUSCULAR | Status: DC
Start: 2023-06-19 — End: 2023-06-19

## 2023-06-19 MED ORDER — FERROUS SULFATE 325 (65 FE) MG PO TABS
325 | ORAL_TABLET | Freq: Every day | ORAL | 1 refills | Status: DC
Start: 2023-06-19 — End: 2024-07-02
  Filled 2023-06-19: qty 90, 90d supply, fill #0

## 2023-06-19 MED ORDER — OCTREOTIDE ACETATE 30 MG IM KIT
30 | Freq: Once | INTRAMUSCULAR | Status: AC
Start: 2023-06-19 — End: 2023-06-19
  Administered 2023-06-19: 15:00:00 30 mg via INTRAMUSCULAR

## 2023-06-19 MED FILL — DULOXETINE HCL 30MG CPEP: 30 30 MG | 30 days supply | Qty: 30 | Fill #0 | Status: AC

## 2023-06-19 MED FILL — BUPROPION HCL ER (XL) 150MG TB24: 150 150 MG | 30 days supply | Qty: 30 | Fill #0 | Status: AC

## 2023-06-19 MED FILL — SANDOSTATIN LAR DEPOT 30 MG IM KIT: 30 MG | INTRAMUSCULAR | Qty: 1 | Fill #0

## 2023-06-19 NOTE — Progress Notes (Signed)
 Monica Wood                                                                                                                  06/19/2023  MRN:   [id number]  Date of Birth:  1976-04-10  PCP:                           Levander Campion, APRN - CNP  Ref

## 2023-06-19 NOTE — Patient Instructions (Signed)
 Octreotide as planned  Rv in on 1/3 with next shot   Pet before rv

## 2023-06-19 NOTE — Progress Notes (Signed)
 Patient arrived for sandostatin injection. No issues and left in stable condition. Returns 1/3

## 2023-06-19 NOTE — Telephone Encounter (Signed)
 Instructions   from Dr. Prudence Davidson, MD    Octreotide as planned  Rv in on 1/3 with next shot   Pet before rv     RV scheduled 07/17/23 at 8am  Injection scheduled 07/17/23 at 8:30am  PET scheduled 12/27 at 3pm

## 2023-06-26 NOTE — Telephone Encounter (Signed)
 Name: Monica Wood  DOB: Jan 12, 1976  MRN: [id number]    Oncology Navigation Follow-Up Note    Contact Type:  Telephone    Notes: Writer called pt to check on her and to see how she's feeling. Pt states overall she feels good other than being really tired a

## 2023-07-10 ENCOUNTER — Inpatient Hospital Stay
Disposition: A | Discharge status: Home / Self Care | Payer: PRIVATE HEALTH INSURANCE | Source: Ambulatory Visit | Admitting: Internal Medicine | Primary: Nurse Practitioner

## 2023-07-10 ENCOUNTER — Ambulatory Visit: Payer: PRIVATE HEALTH INSURANCE | Primary: Nurse Practitioner

## 2023-07-10 ENCOUNTER — Inpatient Hospital Stay
Admit: 2023-07-10 | Disposition: A | Discharge status: Home / Self Care | Payer: PRIVATE HEALTH INSURANCE | Attending: Internal Medicine | Admitting: Internal Medicine | Primary: Nurse Practitioner

## 2023-07-10 ENCOUNTER — Inpatient Hospital Stay
Disposition: A | Discharge status: Home / Self Care | Payer: PRIVATE HEALTH INSURANCE | Source: Ambulatory Visit | Primary: Nurse Practitioner

## 2023-07-10 DIAGNOSIS — E349 Endocrine disorder, unspecified: Principal | ICD-10-CM

## 2023-07-10 DIAGNOSIS — D3A019 Benign carcinoid tumor of the small intestine, unspecified portion: Principal | ICD-10-CM

## 2023-07-10 MED ORDER — COPPER CU 64 DOTATATE 1 MCI/ML IV SOLN
1 | Freq: Once | INTRAVENOUS | Status: AC | PRN
Start: 2023-07-10 — End: 2023-07-10
  Administered 2023-07-10: 21:00:00 4.005 via INTRAVENOUS

## 2023-07-10 MED ORDER — NORMAL SALINE FLUSH 0.9 % IV SOLN
0.9 | INTRAVENOUS | Status: DC | PRN
Start: 2023-07-10 — End: 2023-07-13
  Administered 2023-07-10: 21:00:00 10 mL via INTRAVENOUS

## 2023-07-11 LAB — COMPREHENSIVE METABOLIC PANEL
ALT: 20 U/L (ref 10–35)
AST: 22 U/L (ref 10–35)
Albumin/Globulin Ratio: 1.8 (ref 1.0–2.5)
Albumin: 4.5 g/dL (ref 3.5–5.2)
Alkaline Phosphatase: 112 U/L — ABNORMAL HIGH (ref 35–104)
Anion Gap: 9 mmol/L (ref 9–16)
BUN: 15 mg/dL (ref 6–20)
CO2: 24 mmol/L (ref 20–31)
Calcium: 9.7 mg/dL (ref 8.6–10.4)
Chloride: 103 mmol/L (ref 98–107)
Creatinine: 0.7 mg/dL (ref 0.6–0.9)
Est, Glom Filt Rate: >90 mL/min/{1.73_m2} (ref >60)
Glucose: 92 mg/dL (ref 74–99)
Potassium: 4.2 mmol/L (ref 3.7–5.3)
Sodium: 136 mmol/L (ref 136–145)
Total Bilirubin: 0.2 mg/dL (ref 0.0–1.2)
Total Protein: 7 g/dL (ref 6.6–8.7)

## 2023-07-11 LAB — ESTRADIOL: Estradiol: 26.4 pg/mL

## 2023-07-11 LAB — TSH REFLEX TO FT4: TSH: 1.76 u[IU]/mL (ref 0.27–4.20)

## 2023-07-11 LAB — TESTOSTERONE FREE BIO TOTAL
Sex Hormone Binding: 43 nmol/L (ref 25–122)
Testosterone: <3 ng/dL — ABNORMAL LOW (ref 8–48)

## 2023-07-11 LAB — DHEA-SULFATE: DHEAS (DHEA Sulfate): 81 ug/dL (ref 35.4–256)

## 2023-07-11 LAB — CORTISOL TOTAL: Cortisol: 6.6 ug/dL (ref 2.5–19.5)

## 2023-07-11 LAB — PROGESTERONE: Progesterone: 0.64 ng/mL

## 2023-07-14 LAB — ESTRONE: Estrone: 17.8 pg/mL

## 2023-07-17 ENCOUNTER — Ambulatory Visit
Admit: 2023-07-17 | Discharge: 2023-07-18 | Disposition: A | Discharge status: Home / Self Care | Payer: PRIVATE HEALTH INSURANCE | Attending: Internal Medicine | Admitting: Internal Medicine | Primary: Nurse Practitioner

## 2023-07-17 ENCOUNTER — Ambulatory Visit: Admit: 2023-07-17 | Payer: PRIVATE HEALTH INSURANCE | Admitting: Internal Medicine | Primary: Nurse Practitioner

## 2023-07-17 ENCOUNTER — Encounter: Payer: PRIVATE HEALTH INSURANCE | Attending: Internal Medicine | Primary: Nurse Practitioner

## 2023-07-17 VITALS — BP 91/51 | HR 65 | Temp 96.80000°F | Resp 18 | Wt 219.6 lb

## 2023-07-17 DIAGNOSIS — D3A019 Benign carcinoid tumor of the small intestine, unspecified portion: Secondary | ICD-10-CM

## 2023-07-17 MED ORDER — OCTREOTIDE ACETATE 30 MG IM KIT
30 | Freq: Once | INTRAMUSCULAR | Status: AC
Start: 2023-07-17 — End: 2023-07-17
  Administered 2023-07-17: 14:00:00 30 mg via INTRAMUSCULAR

## 2023-07-17 MED ORDER — TRAMADOL HCL 50 MG PO TABS
50 | ORAL_TABLET | Freq: Three times a day (TID) | ORAL | 0 refills | Status: AC | PRN
Start: 2023-07-17 — End: 2023-07-27
  Filled 2023-07-17: qty 30, 7d supply, fill #0

## 2023-07-17 MED FILL — SANDOSTATIN LAR DEPOT 30 MG IM KIT: 30 MG | INTRAMUSCULAR | Qty: 1

## 2023-07-17 NOTE — Patient Instructions (Signed)
 Refer to oncology surgery at [place]   Tx as planned  Rv in 2 months

## 2023-07-17 NOTE — Progress Notes (Signed)
 Pt. Arrived for sandostatin  Denies any complaints  Seen by Dr.Shahid et tx ok'd  Injection given without incident  Return 08/14/23 sandostatin

## 2023-07-17 NOTE — Progress Notes (Signed)
 Monica Wood                                                                                                                  07/17/2023  MRN:   [id number]  Date of Birth:  1975-08-09  PCP:                           Cheyenne Raisin, APRN - CNP  Referring Physician: No ref. 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  CT PET shows increased intensity of the metastatic lesion but no new lesion.  Patient complains of abdominal pain. Underwent MRI abdomen which shows heterogenous nodule in the mesentery near the level of the iliac bifurcation which is similar to the prior CT.  There is a tiny 2 mm focus in the region of the pancreatic tail likely sidebranch IPMN.  Patient is scheduled for octreotide  today    During this visit patient's allergy, social, medical, surgical history and medications were reviewed and updated.    Summary of Case/History:  Monica Wood a 48 y.o.female is a patient with unresectable well-differentiated endocrine tumor of small bowel presents to the clinic to establish care for further workup and management.  Patient complains of having abdominal pain .  Presented to the ER underwent imaging which showed soft tissue mass in the ventral mesentery anterior to the aorta concerning for neoplasm.  Patient underwent EGD and colonoscopy by GI which showed a 1 cm polyp in the colon but no other significant abnormality.  Subsequently patient was taken to the OR for exploratory laparotomy small bowel resection and lymph node  dissection.  Pathology from the surgery showed multiple well-differentiated neuroendocrine tumors largest measuring 2.8 cm in the mid small bowel resection.  There was also metastasis noted to the lymph nodes.  Patient had a grade 1 disease.  Ki-67 was 1-2%.  Pathological stage was T3 N1.  Patient is recovering from the surgery.  She has a PET scan scheduled next week.  Presents to the clinic to establish care and for further workup.  Patient also noted to have microcytic anemia.  Iron studies done earlier show adequate iron stores.  Patient also complains of hoarseness of voice which has been going on for a while.    Past Medical History:   Past Medical History:   Diagnosis Date    Bursitis     Carcinoid tumor     Fibromyalgia  Scoliosis     Tendinitis      Past Surgical History:  Past Surgical History:   Procedure Laterality Date    ABDOMINOPLASTY      COLONOSCOPY N/A 11/18/2022    COLONOSCOPY POLYPECTOMY SNARE BIOPSY performed by Agapito Heman, MD at [facility] [place] OR    ESOPHAGOGASTRIC FUNDOPLICATION  2000    GALLBLADDER SURGERY      LAPAROTOMY N/A 11/19/2022    LAPAROTOMY EXPLORATORY.  SMALL BOWEL RESECTION.  INCISIONAL BIOPSY OF MESENTERIC LYMPH NODE performed by Teresa Landry LABOR, MD at [facility] OR    TUBAL LIGATION      UPPER GASTROINTESTINAL ENDOSCOPY N/A 11/18/2022    ESOPHAGOGASTRODUODENOSCOPY BIOPSY performed by Agapito Heman, MD at [facility] [place] [facility]     Patient Family Social History:  Family History   Problem Relation Age of Onset    Diabetes Mother     Diabetes Maternal Grandmother      Social History     Tobacco Use    Smoking status: Never    Smokeless tobacco: Never   Substance Use Topics    Alcohol use: Not Currently     Comment: 3/4 times a week    Drug use: Yes     Types: Marijuana Oda)     Comment: uses gummies periodically for pain control     Current Medications:  Current Outpatient Medications   Medication Sig Dispense Refill    ferrous sulfate  (IRON 325) 325 (65 Fe) MG tablet Take 1 tablet  by mouth daily (with breakfast) 90 tablet 1    Lisdexamfetamine Dimesylate 40 MG CAPS Take 40 mg by mouth daily. Max Daily Amount: 40 mg      famotidine  (PEPCID ) 40 MG tablet Take 1 tablet by mouth every evening 30 tablet 3    ketoconazole  (NIZORAL ) 2 % cream Apply 1 Applicatorful topically 2 times daily Apply topically BID for 2-4 weeks 60 g 3    lipase-protease-amylase (CREON ) 36000-114000 units CPEP delayed release capsule Take 1 capsule by mouth 3 times daily (with meals) 90 capsule 5    DULoxetine  (CYMBALTA ) 60 MG extended release capsule take 1 capsule by mouth once daily 90 capsule 1    albuterol  sulfate HFA (PROVENTIL  HFA) 108 (90 Base) MCG/ACT inhaler Inhale 2 puffs into the lungs every 6 hours as needed for Wheezing or Shortness of Breath 18 g 0    DULoxetine  (CYMBALTA ) 30 MG extended release capsule Take 1 capsule by mouth daily 90 capsule 1    buPROPion  (WELLBUTRIN  XL) 150 MG extended release tablet Take 1 tablet by mouth every morning 90 tablet 1    tiZANidine  (ZANAFLEX ) 2 MG tablet Take 1 tablet by mouth daily as needed (back muscle) 30 tablet 1    senna (SENOKOT) 8.6 MG tablet Take 1 tablet by mouth 2 times daily (Patient not taking: Reported on 05/20/2023) 60 tablet 11     No current facility-administered medications for this visit.     Facility-Administered Medications Ordered in Other Visits   Medication Dose Route Frequency Provider Last Rate Last Admin    octreotide  ACETATE (SANDOSTATIN  LAR) injection 30 mg  30 mg IntraMUSCular Once Tamra Koos, MD         Allergies:   Oxycodone, Parsley leaves, Percocet [oxycodone-acetaminophen ], Codeine, and Morphine     Review of Systems:    Constitutional: No fever or chills. No night sweats, no weight loss   Eyes: No eye discharge, double vision, or eye pain   HEENT: negative for sore mouth, sore  throat, hoarseness and voice change   Respiratory: negative for cough , sputum, dyspnea, wheezing, hemoptysis, chest pain   Cardiovascular: negative for chest  pain, dyspnea, palpitations, orthopnea, PND   Gastrointestinal: negative for nausea, vomiting, constipation, abdominal pain, Dysphagia, hematemesis and hematochezia.  Positive for abdominal discomfort.  Diarrhea improved  Genitourinary: negative for frequency, dysuria, nocturia, urinary incontinence, and hematuria   Integument: negative for rash, skin lesions, bruises.   Hematologic/Lymphatic: negative for easy bruising, bleeding, lymphadenopathy, or petechiae   Endocrine: negative for heat or cold intolerance,weight changes, change in bowel habits and hair loss   Musculoskeletal: negative for myalgias, arthralgias, pain, joint swelling,and bone pain   Neurological: negative for headaches, dizziness, seizures, weakness, numbness    Physical Exam:  Vitals: BP (!) 91/51   Pulse 65   Temp 96.8 F (36 C) (Temporal)   Resp 18   Wt 99.6 kg (219 lb 9.6 oz)   SpO2 95%   BMI 36.83 kg/m   General appearance - well appearing, no in pain or distress  Mental status - AAO X3  Eyes - pupils equal and reactive, extraocular eye movements intact  Mouth - mucous membranes moist, pharynx normal without lesions  Neck - supple, no significant adenopathy  Lymphatics - no palpable lymphadenopathy, no hepatosplenomegaly  Chest - clear to auscultation, no wheezes, rales or rhonchi, symmetric air entry  Heart - normal rate, regular rhythm, normal S1, S2, no murmurs  Abdomen - soft, nontender, nondistended, no masses or organomegaly.  Positive healing surgical incision site  Neurological - alert, oriented, normal speech, no focal findings or movement disorder noted  Extremities - peripheral pulses normal, no pedal edema, no clubbing or cyanosis  Skin - normal coloration and turgor, no rashes, no suspicious skin lesions noted       DATA:  Results for orders placed or performed during the hospital encounter of 07/10/23   TSH with Reflex   Result Value Ref Range    TSH 1.76 0.27 - 4.20 uIU/mL   Cortisol Total   Result Value Ref Range     Cortisol 6.6 2.5 - 19.5 ug/dL    Cortisol Collection Info POSTMENOPAUSAL    Progesterone    Result Value Ref Range    Progesterone  0.64 ng/mL   Estradiol    Result Value Ref Range    Estradiol  26.4 pg/mL   Estrone   Result Value Ref Range    Estrone 17.8 pg/mL   Testosterone  Free Bio Total   Result Value Ref Range    Testosterone  <3 (L) 8 - 48 ng/dL    Sex Hormone Binding 43 25 - 122 nmol/L    Testosterone , Free Can not be calculated 1.1 - 5.8 pg/mL    Testosterone , Bioavailable Can not be calculated 2.8 - 16.5 ng/dL   DHEA-Sulfate   Result Value Ref Range    DHEAS (DHEA Sulfate) 81.0 35.4 - 256 ug/dL   Comprehensive Metabolic Panel   Result Value Ref Range    Sodium DISREGARD RESULTS.  INCORRECT PATIENT NUMBER. 135 - 144 mmol/L    Potassium DISREGARD RESULTS.  INCORRECT PATIENT NUMBER. 3.7 - 5.3 mmol/L    Chloride DISREGARD RESULTS.  INCORRECT PATIENT NUMBER. 98 - 107 mmol/L    CO2 DISREGARD RESULTS.  INCORRECT PATIENT NUMBER. 20 - 31 mmol/L    Anion Gap DISREGARD RESULTS.  INCORRECT PATIENT NUMBER. 9 - 17 mmol/L    Glucose DISREGARD RESULTS.  INCORRECT PATIENT NUMBER. 70 - 99 mg/dL    BUN DISREGARD RESULTS.  INCORRECT PATIENT  NUMBER. 6 - 20 mg/dL    Creatinine DISREGARD RESULTS.  INCORRECT PATIENT NUMBER. 0.5 - 0.9 mg/dL    Est, Glom Filt Rate DISREGARD RESULTS.  INCORRECT PATIENT NUMBER. >60 mL/min/1.93m2    Calcium DISREGARD RESULTS.  INCORRECT PATIENT NUMBER. 8.6 - 10.4 mg/dL    Total Protein DISREGARD RESULTS.  INCORRECT PATIENT NUMBER. 6.4 - 8.3 g/dL    Albumin DISREGARD RESULTS.  INCORRECT PATIENT NUMBER. 3.5 - 5.2 g/dL    Albumin/Globulin Ratio DISREGARD RESULTS.  INCORRECT PATIENT NUMBER. 1.0 - 2.5    Total Bilirubin DISREGARD RESULTS.  INCORRECT PATIENT NUMBER. 0.3 - 1.2 mg/dL    Alkaline Phosphatase DISREGARD RESULTS.  INCORRECT PATIENT NUMBER. 35 - 104 U/L    ALT DISREGARD RESULTS.  INCORRECT PATIENT NUMBER. 5 - 33 U/L    AST DISREGARD RESULTS.  INCORRECT PATIENT NUMBER. <32 U/L     PET CT TUMOR IMAGE  SKULL THIGH DOTATATE     Result Date: 07/13/2023  EXAMINATION: WHOLE BODY PET/CT 07/10/2023 TECHNIQUE: Following IV injection of 4 mCi of Ga-68 FDG, PET  tumor imaging was acquired from the base of the skull to the mid thighs.  Computed tomography was used for purposes of attenuation correction and anatomic localization.  Fusion imaging was utilized for interpretation. Uptake time 51 min.  Glucose level not applicable mg/dl. COMPARISON: December 22, 2022 HISTORY: ORDERING SYSTEM PROVIDED HISTORY: Carcinoid tumor of small intestine, unspecified location, unspecified whether malignant TECHNOLOGIST PROVIDED HISTORY: Is the patient pregnant?->No Reason for Exam: Carcinoid tumor of small intestine, unspecified location, unspecified whether malignant, Microcytic anemia FINDINGS: HEAD/NECK: No abnormal radiotracer avid lesion is seen in the visualized basal portion of the head. No abnormal no radiotracer avidity is seen in the lymph nodes of the neck. No abnormal radiotracer accumulation is noted in the soft tissues of the nasopharynx, oropharynx, glottic, or subglottic compartments. No abnormal radiotracer accumulation is seen in the remainder of the soft tissues of the neck. The CT portion of the study demonstrates no masses or lymphadenopathy in the neck. CHEST: No abnormal activity is seen in the hilum or mediastinum. No radiotracer avid lesion is seen in the lung parenchyma. The CT portion of the chest demonstrates no lung parenchymal nodules or masses. ABDOMEN: No abnormal radiotracer avid lesion is seen in the lymph nodes of the retroperitoneum,  porta hepatis, gastrohepatic ligament. A large mesenteric lymph node is present measuring 2.5 cm diameter with a max SUV of 53.1 and previously demonstrated a max SUV of 48.7 and measured 2.1 cm diameter.  Just craniad is another mesenteric lymph node which currently demonstrates a max SUV of 38 measuring 8 mm and previously demonstrated a max SUV of of 12.3 and measured 7 mm.  No abnormal radiotracer accumulation is noted in the liver or the remainder of the intra-abdominal solid organs. CT portion of the study of the abdomen demonstrates no abnormality of the intra-abdominal solid organs. No abnormality of the stomach the remainder of the enteric tract. PELVIS: No iliac chain lymphadenopathy is seen. No other lymph nodes are seen in the pelvis. No abnormal metabolic activity is seen in the pelvic soft tissues. BONES/SOFT TISSUE: No abnormal radiotracer avid lesion is seen in the subcutaneous soft tissues. No abnormal radiotracer avid lesion is seen in the skeletal system. The CT portion of the study in bone window settings demonstrates no osteolytic or osteoblastic lesions. INCIDENTAL CT FINDINGS: Surgical clips are demonstrated in the right upper quadrant consistent with prior cholecystectomy.     Findings consistent with  progression of metastatic disease with increased radiotracer avidity of the previously noted mesenteric lymph nodes and some increase in size from previous. 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  Reviewed results of CT PET.  Reviewed images.  Compared with the prior scan.  Increase in size of the mesenteric mass as well as activity noted.  No new lesion noted.  Discussed treatment options including adding everolimus, continuing octreotide  with close monitoring as well as second opinion.  Patient is symptomatic from the mesenteric mass.  Complains abdominal pain.  She wants to  get a second opinion regarding resection of the mesenteric mass.  [facility]  Continue octreotide  in the meanwhile  Continue octreotide  at 30 mg dose every 4 weeks.  Will continue monitor for symptoms.  Will need surveillance for IPMN.  Will repeat MRI in 1 year, 06/02/2024  Continue oral iron supplementation  Return to clinic in 2 months or sooner  NCCN guidelines were reviewed and discussed with the patient.  Hemoglobin electrophoresis unremarkable however alpha thalassemia not ruled out  The diagnosis and care plan were discussed with the patient in detail. I discussed the natural history of the disease, prognosis, risks and goals of therapy and answered all the patients questions to the best of my ability.  Patient expressed understanding and was in agreement.    LIVIA PENCE, MD    I spent a total of 40 minutes on the date of the service which included preparing to see the patient, face-to-face patient care, completing clinical documentation, obtaining and/or reviewing separately obtained history, performing a medically appropriate examination, counseling and educating the patient/family/caregiver, ordering medications, tests, or procedures, communicating with other HCPs (not separately reported), independently interpreting results (not separately reported), communicating results to the patient/family/caregiver and care coordination (not separately reported).      This note is created with the assistance of a speech recognition program.  While intending to generate a document that actually reflects the content of the visit, the document can still have some errors including those of syntax and sound a like substitutions which may escape proof reading.  It such instances, actual meaning can be extrapolated by contextual diversion.

## 2023-07-17 NOTE — Telephone Encounter (Signed)
 Instructions   from Dr. Livia Pence, MD    Refer to oncology surgery at [place]   Tx as planned  Rv in 2 months     Faxed over Referral form, Demographics, and appropriate information to [facility]  Rv scheduled on 09/11/2023 @8 :15; gave pt a copy of their AVS, and their labs to be done  **obtain lab results

## 2023-07-22 MED FILL — FAMOTIDINE 40MG TABS: 40 40 MG | 30 days supply | Qty: 30 | Fill #2 | Status: AC

## 2023-07-22 NOTE — Telephone Encounter (Signed)
Pt lvm to follow up on scheduling a PVR testing    [facility]- returned call, lvm, stating the referring phy need to place the order for the PVR testing, pt is to call [phone] if scheduling testing w[facility]in the [facility], then call [facility] for Consultation appt.

## 2023-07-24 ENCOUNTER — Inpatient Hospital Stay: Admit: 2023-07-24 | Discharge: 2023-09-04 | Payer: PRIVATE HEALTH INSURANCE | Primary: Nurse Practitioner

## 2023-07-24 ENCOUNTER — Inpatient Hospital Stay: Admit: 2023-07-24 | Payer: PRIVATE HEALTH INSURANCE | Primary: Nurse Practitioner

## 2023-07-24 DIAGNOSIS — R200 Anesthesia of skin: Secondary | ICD-10-CM

## 2023-07-25 DIAGNOSIS — R200 Anesthesia of skin: Secondary | ICD-10-CM

## 2023-07-25 LAB — VAS DUP UPPER EXTREMITY ARTERIES BILATERAL W COMPLETE PHYSIOLOGIC DOPPLER
Left 2nd Digit BP: 200 mm[Hg]
Left ATA mid PSV: 74.2 cm/s
Left Brachial A dist PSV: 81.1 cm/s
Left Brachial A prox PSV: 77.2 cm/s
Left CFA prox PSV: 146 cm/s
Left DBI 2nd DIGIT: 1.61
Left Mid Ax A PSV: 62.4 cm/s
Left PFA prox PSV: 65.4 cm/s
Left PTA mid PSV: 38.4 cm/s
Left Pop A dist PSV: 55.7 cm/s
Left Pop A prox PSV: 60 cm/s
Left Pop A prox vel ratio: 0.5
Left Prox Radial A BP: 125 mm[Hg]
Left Prox Ulnar A BP: 124 mm[Hg]
Left Radial A dist PSV: 48.2 cm/s
Left Radial A mid PSV: 61.9 cm/s
Left Radial A prox PSV: 61.9 cm/s
Left SFA dist PSV: 121 cm/s
Left SFA dist vel ratio: 0.99
Left SFA mid PSV: 122 cm/s
Left SFA mid vel ratio: 1.08
Left SFA prox PSV: 113 cm/s
Left SFA prox vel ratio: 0.77
Left Ulnar A Mid PSV: 30.6 cm/s
Left Ulnar A Prox PSV: 62.9 cm/s
Left Ulnar A dist PSV: 30.8 cm/s
Left WBI: 1.01
Left arm BP: 124 mm[Hg]
Left peroneal mid PSV: 34.1 cm/s
Left subclavian dist PSV: 94.8 cm/s
Left subclavian mid PSV: 34.4 cm/s
Left subclavian prox PSV: 91.4 cm/s
Right 2nd Digit BP: 92 mm[Hg]
Right ATA mid PSV: 65.9 cm/s
Right Ax A mid PSV: 82.7 cm/s
Right Brachial A dist PSV: 107 cm/s
Right Brachial A prox PSV: 125 cm/s
Right CFA prox PSV: 160 cm/s
Right CFA prox PSV: 91.8 cm/s
Right DBI 2nd DIGIT: 0.74
Right PTA mid PSV: 65.9 cm/s
Right Pop A dist PSV: 56.1 cm/s
Right Pop A prox PSV: 56.4 cm/s
Right Pop A prox vel ratio: 0.48
Right Prox Radial A BP: 123 mm[Hg]
Right Prox Ulnar A BP: 123 mm[Hg]
Right Radial A dist PSV: 62.3 cm/s
Right Radial A mid PSV: 55.3 cm/s
Right Radial A prox PSV: 52.1 cm/s
Right SFA dist PSV: 117 cm/s
Right SFA prox PSV: 130 cm/s
Right SFA prox vel ratio: 0.8
Right Ulna A dist PSV: 49.4 cm/s
Right Ulnar A Mid PSV: 42.4 cm/s
Right Ulnar A Prox PSV: 38.2 cm/s
Right WBI: 0.99
Right arm BP: 124 mm[Hg]
Right peronal mid PSV: 44.3 cm/s
Right subclavian dist PSV: 104 cm/s
Right subclavian mid PSV: 97.4 cm/s
Right subclavian prox PSV: 119 cm/s

## 2023-07-26 LAB — VAS DUP LOWER EXTREMITY ARTERIES BILATERAL W COMPLETE PHYSIOLOGIC DOPPLER
Left ABI: 1.05
Left ATA mid PSV: 74.2 cm/s
Left CFA prox PSV: 146 cm/s
Left CFA vel ratio: 1.55
Left EIA dist PSV: 94.4 cm/s
Left PFA prox PSV: 65.4 cm/s
Left PTA mid PSV: 38.4 cm/s
Left Pop A dist PSV: 55.7 cm/s
Left Pop A prox PSV: 60 cm/s
Left Pop A prox vel ratio: 0.5
Left SFA dist PSV: 121 cm/s
Left SFA dist vel ratio: 0.99
Left SFA mid PSV: 122 cm/s
Left SFA mid vel ratio: 1.08
Left SFA prox PSV: 113 cm/s
Left SFA prox vel ratio: 0.77
Left TBI: 1.01
Left ankle BP: 125 mm[Hg]
Left arm BP: 124 mm[Hg]
Left dorsalis pedis BP: 130 mm[Hg]
Left peroneal mid PSV: 34.1 cm/s
Left posterior tibial: 125 mm[Hg]
Left toe pressure: 125 mm[Hg]
Right ABI: 1.12
Right ATA mid PSV: 65.9 cm/s
Right CFA prox PSV: 160 cm/s
Right CFA prox PSV: 91.8 cm/s
Right CFA vel ratio: 1.6
Right EIA dist PSV: 99.9 cm/s
Right PTA mid PSV: 65.9 cm/s
Right Pop A dist PSV: 56.1 cm/s
Right Pop A prox PSV: 56.4 cm/s
Right Pop A prox vel ratio: 0.48
Right SFA dist PSV: 117 cm/s
Right SFA dist vel ratio: 0.93
Right SFA mid PSV: 126 cm/s
Right SFA mid vel ratio: 1
Right SFA prox PSV: 130 cm/s
Right SFA prox vel ratio: 0.8
Right TBI: 0.88
Right ankle BP: 133 mm[Hg]
Right arm BP: 124 mm[Hg]
Right dorsalis pedis BP: 139 mm[Hg]
Right peronal mid PSV: 44.3 cm/s
Right posterior tibial: 133 mm[Hg]
Right toe pressure: 109 mm[Hg]

## 2023-07-30 ENCOUNTER — Ambulatory Visit
Admit: 2023-07-30 | Discharge: 2023-07-30 | Payer: PRIVATE HEALTH INSURANCE | Attending: Nurse Practitioner | Primary: Nurse Practitioner

## 2023-07-30 VITALS — BP 100/68 | HR 64 | Wt 218.4 lb

## 2023-07-30 DIAGNOSIS — Z0000 Encounter for general adult medical examination without abnormal findings: Secondary | ICD-10-CM

## 2023-07-30 NOTE — Patient Instructions (Signed)
 Well Visit, Ages 45 to 36: Care Instructions  Well visits can help you stay healthy. Your doctor has checked your overall health and may have suggested ways to take good care of yourself. Your doctor also may have recommended tests. You can help prevent illness with healthy eating, good sleep, vaccinations, regular exercise, and other steps.    Get the tests that you and your doctor decide on. Depending on your age and risks, examples might include screening for diabetes; hepatitis C; HIV; and cervical, breast, lung, and colon cancer. Screening helps find diseases before any symptoms appear.   Eat healthy foods. Choose fruits, vegetables, whole grains, lean protein, and low-fat dairy foods. Limit saturated fat and reduce salt.     Limit alcohol. Men should have no more than 2 drinks a day. Women should have no more than 1. For some people, no alcohol is the best choice.   Exercise. Get at least 30 minutes of exercise on most days of the week. Walking can be a good choice.     Reach and stay at your healthy weight. This will lower your risk for many health problems.   Take care of your mental health. Try to stay connected with friends, family, and community, and find ways to manage stress.     If you're feeling depressed or hopeless, talk to someone. A counselor can help. If you don't have a counselor, talk to your doctor.   Talk to your doctor if you think you may have a problem with alcohol or drug use. This includes prescription medicines, marijuana, and other drugs.     Avoid tobacco and nicotine: Don't smoke, vape, or chew. If you need help quitting, talk to your doctor.   Practice safer sex. Getting tested, using condoms or dental dams, and limiting sex partners can help prevent STIs.     Use birth control if it's important to you to prevent pregnancy. Talk with your doctor about your choices and what might be best for you.   Prevent problems where you can. Protect your skin from too much sun, wash your  hands, brush your teeth twice a day, and wear a seat belt in the car.   Where can you learn more?  Go to [employer] and enter P072 to learn more about "Well Visit, Ages 36 to 50: Care Instructions."  Current as of: November 11, 2022  Content Version: 14.3   [address], .   Care instructions adapted under license by [employer]. If you have questions about a medical condition or this instruction, always ask your healthcare professional. Larene [place], [facility], disclaims any warranty or liability for your use of this information.

## 2023-07-30 NOTE — Progress Notes (Signed)
Well Adult Note  Name: Monica Wood [id number] Date: 07/30/2023   MRN: [id number] Sex: Female   Age: 48 y.o. Ethnicity: Declined   DOB: 1975-10-04 Race: Declined      Monica Wood is here for a well adult exam.          Assessment & Plan  1. Blood pressure management.  Her blood pressure readings are within the normal range, with a reading of 100/68 on one arm and 122/86 on the other. She reports ongoing circulation issues, possibly related to the tumor. She is advised to follow up with the vascular clinic to address these concerns.  [name]'s were normal  2. Tumor.  The tumor has shown slight growth, but the oncologist has advised not to worry. She has an appointment with her oncologist on 08/14/2023 and will receive an injection that day. She will see the oncologist again on 09/11/2023.    3. Postmenopausal state.  She has not had a period in almost 16 years and has undergone ablation. She experiences symptoms such as night sweats and hot flashes. Her testosterone levels are low. She has started using wild yam cream and is considering hormone replacement therapy (HRT). She will follow up with a urogynecologist to discuss HRT, ensuring it is safe given her tumor. She is due for a mammogram, and an order for the mammogram will be placed.    4. Medication management.  She is currently on Vyvanse, duloxetine, and Wellbutrin, managed by her psychiatrist. No changes to her medication regimen are needed at this time.    Follow-up  The patient will follow up in 6 months.    PROCEDURE  The patient underwent an ablation procedure in the past.    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       Return in about 6 months (around 01/27/2024) for check up.      Subjective   History of Present Illness  The patient presents for evaluation for physical and of blood pressure, tumor, postmenopausal state, and medication management.    She has been experiencing persistent issues with her blood pressure, despite a recent visit to the vascular clinic where she was informed that her condition was normal. She reports a discrepancy in blood pressure readings between her arms, with one arm showing a reading of 100/68 and the other 122/86. She also mentions ongoing circulatory problems, which she suspects may be related to the location of her tumor. She has not yet consulted with a physician at the vascular clinic but plans to schedule an appointment.    She reports a slight increase in the size of her tumor, but her oncologist has advised her not to be concerned. She has reviewed her CT scans from the past few years and believes the tumor has developed over the past 2 years. She has an upcoming appointment with her oncologist on 08/14/2023 and a follow-up on 09/11/2023. She also has a scheduled appointment with a gastroenterologist on 09/10/2023 to discuss her MRI results and laboratory tests.    She is currently in menopause, having not menstruated for approximately 16 years. She underwent an ablation procedure in the past. A few years ago, she experienced severe night sweats, necessitating  the use of a towel. She continues to experience hot flashes, which she attributes to her tumor. She has initiated treatment with wild yam cream and has an upcoming appointment with a urogynecologist to discuss hormone replacement therapy (HRT). Her oncologist has approved the use of HRT. She reports a decreased libido and is due for a mammogram.    She is under the care of a psychiatrist who manages her medications, including Vyvanse, duloxetine, and Wellbutrin.    MEDICATIONS  Vyvanse, duloxetine, Wellbutrin      Review of Systems   Constitutional:  Negative for chills, fatigue and fever.    HENT:  Negative for ear discharge, ear pain, sinus pressure, sinus pain, sore throat and trouble swallowing.    Eyes:  Negative for discharge, redness and itching.   Respiratory:  Negative for cough, chest tightness, shortness of breath and wheezing.    Cardiovascular:  Negative for chest pain.   Gastrointestinal:  Negative for abdominal pain, diarrhea, nausea and vomiting.   Genitourinary:  Negative for difficulty urinating.   Musculoskeletal:  Negative for arthralgias and neck pain.   Skin:  Negative for rash.   Neurological:  Negative for dizziness, weakness, light-headedness and headaches.   All other systems reviewed and are negative.         Allergies   Allergen Reactions    Oxycodone Anaphylaxis    Parsley Leaves Anaphylaxis, Hives, Itching and Shortness Of Breath    Percocet [Oxycodone-Acetaminophen] Shortness Of Breath    Codeine      Anaphylaxis     Morphine      Prior to Visit Medications    Medication Sig Taking? Authorizing Provider   ferrous sulfate (IRON 325) 325 (65 Fe) MG tablet Take 1 tablet by mouth daily (with breakfast) Yes Shahid, Nauman, MD   Lisdexamfetamine Dimesylate 40 MG CAPS Take 40 mg by mouth daily. Max Daily Amount: 40 mg Yes [provider]   famotidine (PEPCID) 40 MG tablet Take 1 tablet by mouth every evening Yes Daboul, Isam, MD   ketoconazole (NIZORAL) 2 % cream Apply 1 Applicatorful topically 2 times daily Apply topically BID for 2-4 weeks Yes Levander Campion, APRN - CNP   lipase-protease-amylase (CREON) 36000-114000 units CPEP delayed release capsule Take 1 capsule by mouth 3 times daily (with meals) Yes Hattendorf, Sydney, PA   DULoxetine (CYMBALTA) 60 MG extended release capsule take 1 capsule by mouth once daily Yes Levander Campion, APRN - CNP   albuterol sulfate HFA (PROVENTIL HFA) 108 (90 Base) MCG/ACT inhaler Inhale 2 puffs into the lungs every 6 hours as needed for Wheezing or Shortness of Breath Yes Bunnie Domino, APRN - CNP   DULoxetine (CYMBALTA) 30 MG  extended release capsule Take 1 capsule by mouth daily Yes Levander Campion, APRN - CNP   buPROPion (WELLBUTRIN XL) 150 MG extended release tablet Take 1 tablet by mouth every morning Yes Levander Campion, APRN - CNP   tiZANidine (ZANAFLEX) 2 MG tablet Take 1 tablet by mouth daily as needed (back muscle) Yes Patti, Dilnoor, MD   senna (SENOKOT) 8.6 MG tablet Take 1 tablet by mouth 2 times daily  Patient not taking: Reported on 05/20/2023  Lucile Shutters, [place]     Past Medical History:   Diagnosis Date    Bursitis     Carcinoid tumor     Depression 2020    Fibromyalgia     Irritable bowel syndrome 2001    Obesity 1985    Osteoarthritis 2019  Scoliosis     Tendinitis      Past Surgical History:   Procedure Laterality Date    ABDOMINOPLASTY      COLONOSCOPY N/A 11/18/2022    COLONOSCOPY POLYPECTOMY SNARE BIOPSY performed by Marcia Brash, MD at [facility] [place] [facility]    ESOPHAGOGASTRIC FUNDOPLICATION  2000    GALLBLADDER SURGERY      LAPAROTOMY N/A 11/19/2022    LAPAROTOMY EXPLORATORY.  SMALL BOWEL RESECTION.  INCISIONAL BIOPSY OF MESENTERIC LYMPH NODE performed by Lucious Groves, MD at [facility] OR    TUBAL LIGATION      UPPER GASTROINTESTINAL ENDOSCOPY N/A 11/18/2022    ESOPHAGOGASTRODUODENOSCOPY BIOPSY performed by Marcia Brash, MD at [facility] [place] [facility]     Family History   Problem Relation Age of Onset    Diabetes Mother     Allergy (Severe) Mother     Anemia Mother     Arthritis Mother     Depression Mother     Other Mother         ADHD    Diabetes Maternal Grandmother     Diabetes Father      Social History     Tobacco Use    Smoking status: Never    Smokeless tobacco: Never   Substance Use Topics    Alcohol use: Yes     Alcohol/week: 4.0 standard drinks of alcohol     Types: 4 Glasses of wine per week     Comment: 3/4 times a week    Drug use: Yes     Types: Marijuana Sheran Fava)     Comment: uses gummies periodically for pain control        Objective    Vital Signs  BP 100/68   Pulse 64   Wt 99.1 kg (218 lb 6.4 oz)    SpO2 99%   BMI 36.62 kg/m     Wt Readings from Last 3 Encounters:   07/30/23 99.1 kg (218 lb 6.4 oz)   07/17/23 99.6 kg (219 lb 9.6 oz)   06/19/23 97.1 kg (214 lb)       Physical Exam  Vital Signs  Blood pressure is normal.  Physical Exam  Constitutional:       Appearance: Normal appearance. She is obese.   HENT:      Head: Normocephalic and atraumatic.      Right Ear: Tympanic membrane normal.      Left Ear: Tympanic membrane normal.      Mouth/Throat:      Mouth: Mucous membranes are moist.   Eyes:      Extraocular Movements: Extraocular movements intact.   Cardiovascular:      Rate and Rhythm: Normal rate and regular rhythm.      Heart sounds: Normal heart sounds.   Pulmonary:      Effort: Pulmonary effort is normal.      Breath sounds: Normal breath sounds.   Musculoskeletal:      Cervical back: Normal range of motion and neck supple.   Skin:     General: Skin is warm and dry.   Neurological:      Mental Status: She is alert and oriented to person, place, and time.   Psychiatric:         Attention and Perception: Attention normal.         Mood and Affect: Mood normal.         Speech: Speech normal.         Behavior: Behavior normal.  Behavior is cooperative.         Thought Content: Thought content normal.              Personalized Preventive Plan  Current Health Maintenance Status  Immunization History   Administered Date(s) Administered    Hep B, ENGERIX-B, (age 20y+), IM, 1mL 05/06/2023        Health Maintenance Due   Topic Date Due    DTaP/Tdap/Td vaccine (1 - Tdap) Never done    Flu vaccine (1) Never done    COVID-19 Vaccine (1 - 2023-24 season) Never done    Hepatitis B vaccine (2 of 3 - 19+ 3-dose series) 06/03/2023      Recommendations for Preventive Services Due: see orders and patient instructions/AVS.    The patient (or guardian, if applicable) and other individuals in attendance with the patient were advised that Artificial Intelligence will be utilized during this visit to record and process the  conversation to generate a clinical note. The patient (or guardian, if applicable) and other individuals in attendance at the appointment consented to the use of AI, including the recording.

## 2023-07-31 NOTE — Telephone Encounter (Signed)
Name: Monica Wood  DOB: [birth year]  MRN: [id number]    Oncology Navigation Follow-Up Note    Contact Type:  Telephone    Notes: Writer called pt to check on her and to see how her tx's are going. No answer, detailed VM left requesting a return call.      Electronically signed by Lenard Simmer, RN on 07/31/2023 at 1:18 PM

## 2023-08-10 ENCOUNTER — Ambulatory Visit
Admit: 2023-08-10 | Discharge: 2023-08-10 | Payer: PRIVATE HEALTH INSURANCE | Attending: Gastroenterology | Primary: Nurse Practitioner

## 2023-08-10 VITALS — BP 123/77 | HR 69 | Temp 97.20000°F | Resp 16 | Wt 221.0 lb

## 2023-08-10 DIAGNOSIS — D3A019 Benign carcinoid tumor of the small intestine, unspecified portion: Secondary | ICD-10-CM

## 2023-08-10 NOTE — Progress Notes (Signed)
GI CLINIC FOLLOW UP    INTERVAL HISTORY:   No referring provider defined for this encounter.    Chief Complaint   Patient presents with    Follow-up     3 month follow up with labs and imaging results.        HISTORY OF PRESENT ILLNESS: [name] is a 48 y.o. 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    Patient initially presented to ER on 11/16/22 for abd pain. Ct showed partial SBO due to mesenteric mass. Mass was not previously seen on MRI abd 08/14/22. Tumor pathology significant for multiple well-differentiated NETs extending through the muscularis propria into subserosal tissue without penetration of the overlying serosa with local lymph node involvement. CEA, CA125, CA19-9, chromagranin A wnl 11/17/22.    Continuing to follow with Dr Cloretta Ned  Who referred the to [facility] clinic for second opinion on surgery as the patient was deemed not to be operable by our surgeons, the referral was made on July 17, 2023 by Dr. Cloretta Ned  Continuing octreotide and referred to a surgical oncology team for 2nd opinion about resection of mesenteric mass   PET 07/10/23    IMPRESSION:  Findings consistent with progression of metastatic disease with increased  radiotracer avidity of the previously noted mesenteric lymph nodes and some  increase in size from previous.      Liver labs also ordered for elevated LFTs  Negative hepatitis/ANA/AMA/ASMA, M1M1, normal AFP  Negative except for 2mm side-branch IPMN        Past Medical,Family, and Social History reviewed and does contribute to the patient presentingcondition.    I did review all the labs results available for the labs which were ordered by the primary care physician, and the other consultants, we search on epic at [facility] and all the available care everywhere epic    I did review all the imaging studies of the abdomen available on EMR, ordered by the primary care  physician and the other consultant    I did review all the pathology from the biopsies done on the previous endoscopies    Patient's PMH/PSH,SH,PSYCH Hx, MEDs, ALLERGIES, and ROS were all reviewed and updated in the appropriate sections.    PAST MEDICAL HISTORY:  Past Medical History:   Diagnosis Date    Bursitis     Carcinoid tumor     Depression 2020    Fibromyalgia     Irritable bowel syndrome 2001    Obesity 1985    Osteoarthritis 2019    Scoliosis     Tendinitis        Past Surgical History:   Procedure Laterality Date    ABDOMINOPLASTY      COLONOSCOPY N/A 11/18/2022    COLONOSCOPY POLYPECTOMY SNARE BIOPSY performed by Marcia Brash, MD at [facility] [place] [facility]    ESOPHAGOGASTRIC FUNDOPLICATION  2000    GALLBLADDER SURGERY      LAPAROTOMY N/A 11/19/2022    LAPAROTOMY EXPLORATORY.  SMALL BOWEL RESECTION.  INCISIONAL BIOPSY OF MESENTERIC LYMPH NODE performed by Lucious Groves, MD at [facility] OR    TUBAL LIGATION      UPPER GASTROINTESTINAL ENDOSCOPY N/A 11/18/2022    ESOPHAGOGASTRODUODENOSCOPY BIOPSY performed by Marcia Brash, MD at [facility] [place] [facility]       CURRENT MEDICATIONS:    Current Outpatient Medications:     ferrous sulfate (IRON 325) 325 (65 Fe) MG tablet, Take 1 tablet by mouth  daily (with breakfast), Disp: 90 tablet, Rfl: 1    Lisdexamfetamine Dimesylate 40 MG CAPS, Take 40 mg by mouth daily. Max Daily Amount: 40 mg, Disp: , Rfl:     famotidine (PEPCID) 40 MG tablet, Take 1 tablet by mouth every evening, Disp: 30 tablet, Rfl: 3    ketoconazole (NIZORAL) 2 % cream, Apply 1 Applicatorful topically 2 times daily Apply topically BID for 2-4 weeks, Disp: 60 g, Rfl: 3    lipase-protease-amylase (CREON) 36000-114000 units CPEP delayed release capsule, Take 1 capsule by mouth 3 times daily (with meals), Disp: 90 capsule, Rfl: 5    senna (SENOKOT) 8.6 MG tablet, Take 1 tablet by mouth 2 times daily, Disp: 60 tablet, Rfl: 11    DULoxetine (CYMBALTA) 60 MG extended release capsule, take 1 capsule by mouth once  daily, Disp: 90 capsule, Rfl: 1    albuterol sulfate HFA (PROVENTIL HFA) 108 (90 Base) MCG/ACT inhaler, Inhale 2 puffs into the lungs every 6 hours as needed for Wheezing or Shortness of Breath, Disp: 18 g, Rfl: 0    DULoxetine (CYMBALTA) 30 MG extended release capsule, Take 1 capsule by mouth daily, Disp: 90 capsule, Rfl: 1    buPROPion (WELLBUTRIN XL) 150 MG extended release tablet, Take 1 tablet by mouth every morning, Disp: 90 tablet, Rfl: 1    tiZANidine (ZANAFLEX) 2 MG tablet, Take 1 tablet by mouth daily as needed (back muscle), Disp: 30 tablet, Rfl: 1    ALLERGIES:   Allergies   Allergen Reactions    Oxycodone Anaphylaxis    Parsley Leaves Anaphylaxis, Hives, Itching and Shortness Of Breath    Percocet [Oxycodone-Acetaminophen] Shortness Of Breath    Codeine      Anaphylaxis     Morphine        FAMILY HISTORY:       Problem Relation Age of Onset    Diabetes Mother     Allergy (Severe) Mother     Anemia Mother     Arthritis Mother     Depression Mother     Other Mother         ADHD    Diabetes Maternal Grandmother     Diabetes Father          SOCIAL HISTORY:   Social History     Socioeconomic History    Marital status: Married     Spouse name: Not on file    Number of children: Not on file    Years of education: Not on file    Highest education level: Not on file   Occupational History    Not on file   Tobacco Use    Smoking status: Never    Smokeless tobacco: Never   Vaping Use    Vaping status: Not on file   Substance and Sexual Activity    Alcohol use: Yes     Alcohol/week: 4.0 standard drinks of alcohol     Types: 4 Glasses of wine per week     Comment: 3/4 times a week    Drug use: Yes     Types: Marijuana Sheran Fava)     Comment: uses gummies periodically for pain control    Sexual activity: Yes     Partners: Male   Other Topics Concern    Not on file   Social History Narrative    Not on file     Social Determinants of Health     Financial Resource Strain: Low Risk  (12/01/2022)  Overall Financial Resource  Strain (CARDIA)     Difficulty of Paying Living Expenses: Not hard at all   Food Insecurity: No Food Insecurity (07/30/2023)    Hunger Vital Sign     Worried About Running Out of Food in the Last Year: Never true     Ran Out of Food in the Last Year: Never true   Transportation Needs: No Transportation Needs (07/30/2023)    PRAPARE - Therapist, art (Medical): No     Lack of Transportation (Non-Medical): No   Physical Activity: Insufficiently Active (02/16/2022)    Exercise Vital Sign     Days of Exercise per Week: 4 days     Minutes of Exercise per Session: 30 min   Stress: Not on file   Social Connections: Not on file   Intimate Partner Violence: Not At Risk (02/16/2022)    Humiliation, Afraid, Rape, and Kick questionnaire     Fear of Current or Ex-Partner: No     Emotionally Abused: No     Physically Abused: No     Sexually Abused: No   Housing Stability: Low Risk  (07/30/2023)    Housing Stability Vital Sign     Unable to Pay for Housing in the Last Year: No     Number of Times Moved in the Last Year: 0     Homeless in the Last Year: No       REVIEW OF SYSTEMS: A 12-point review of systemswas obtained and pertinent positives and negatives were enumerated above in the history of present illness. All other reviewed systems / symptoms were negative.    Review of Systems   Constitutional:  Negative for appetite change, fatigue and unexpected weight change.   HENT:  Negative for sore throat, trouble swallowing and voice change.    Respiratory:  Negative for cough, choking and wheezing.    Cardiovascular:  Negative for chest pain, palpitations and leg swelling.   Gastrointestinal:  Negative for abdominal distention, abdominal pain, anal bleeding, blood in stool, constipation, diarrhea, nausea, rectal pain and vomiting.   Genitourinary:  Negative for flank pain.   Allergic/Immunologic: Negative for food allergies and immunocompromised state.   Neurological:  Negative for dizziness, speech difficulty,  weakness, light-headedness and headaches.   Hematological:  Does not bruise/bleed easily.   Psychiatric/Behavioral:  Negative for sleep disturbance.            LABORATORY DATA: Reviewed  Lab Results   Component Value Date    WBC 5.1 06/19/2023    HGB 11.1 (L) 06/19/2023    HCT 35.0 (L) 06/19/2023    MCV 73.4 (L) 06/19/2023    PLT 327 06/19/2023    NA DISREGARD RESULTS.  INCORRECT PATIENT NUMBER. 07/10/2023    NA 136 07/10/2023    K DISREGARD RESULTS.  INCORRECT PATIENT NUMBER. 07/10/2023    K 4.2 07/10/2023    CL DISREGARD RESULTS.  INCORRECT PATIENT NUMBER. 07/10/2023    CL 103 07/10/2023    CO2 DISREGARD RESULTS.  INCORRECT PATIENT NUMBER. 07/10/2023    CO2 24 07/10/2023    BUN DISREGARD RESULTS.  INCORRECT PATIENT NUMBER. 07/10/2023    BUN 15 07/10/2023    CREATININE DISREGARD RESULTS.  INCORRECT PATIENT NUMBER. 07/10/2023    CREATININE 0.7 07/10/2023    BILITOT DISREGARD RESULTS.  INCORRECT PATIENT NUMBER. 07/10/2023    BILITOT 0.2 (L) 07/10/2023    ALKPHOS DISREGARD RESULTS.  INCORRECT PATIENT NUMBER. 07/10/2023    ALKPHOS 112 (H) 07/10/2023  AST DISREGARD RESULTS.  INCORRECT PATIENT NUMBER. 07/10/2023    AST 22 07/10/2023    ALT DISREGARD RESULTS.  INCORRECT PATIENT NUMBER. 07/10/2023    ALT 20 07/10/2023    INR <0.8 05/04/2023         Lab Results   Component Value Date    RBC 4.77 06/19/2023    HGB 11.1 (L) 06/19/2023    MCV 73.4 (L) 06/19/2023    MCH 23.2 (L) 06/19/2023    MCHC 31.7 06/19/2023    RDW 15.5 (H) 06/19/2023    MPV 7.1 06/19/2023    BASOPCT 1 06/19/2023    LYMPHSABS 2.20 06/19/2023    MONOSABS 0.50 06/19/2023    NEUTROABS 2.20 06/19/2023    EOSABS 0.20 06/19/2023    BASOSABS 0.10 06/19/2023         DIAGNOSTIC TESTING:     Vascular duplex lower extremity arteries bilateral with complete physiologic Doppler    Result Date: 07/25/2023    Right side findings: Resting [name] is 1.12. Resting TBI is 0.88. Normal range. Multiphasic waveforms present.   Left side findings: Resting [name] is 1.05. Resting TBI  is 1.01. Normal range. Multiphasic waveforms present.     Vascular duplex upper extremity arteries bilateral with complete physiologic Doppler    Result Date: 07/25/2023    Right side findings: WBI is 0.99. Normal range. Patent arteries with multiphasic waveforms.   Left side findings: WBI is 1.01. Normal range. Patent arteries with multiphasic waveforms.          PHYSICAL EXAMINATION: Vital signs reviewed per the nursing documentation.     BP 123/77   Pulse 69   Temp 97.2 F (36.2 C) (Tympanic)   Resp 16   Wt 100.2 kg (221 lb)   SpO2 98%   BMI 37.06 kg/m   Body mass index is 37.06 kg/m.   Physical Exam  Constitutional:       General: She is not in acute distress.     Appearance: She is not ill-appearing.   HENT:      Head: Normocephalic.      Mouth/Throat:      Lips: Pink.      Mouth: Mucous membranes are moist. No oral lesions.   Eyes:      General: No scleral icterus.     Pupils: Pupils are equal, round, and reactive to light.   Cardiovascular:      Rate and Rhythm: Normal rate and regular rhythm.      Pulses: No decreased pulses.      Heart sounds: No murmur heard.  Pulmonary:      Effort: Pulmonary effort is normal.      Breath sounds: Normal breath sounds.   Abdominal:      General: Bowel sounds are normal. There is no distension.      Palpations: Abdomen is soft. There is no fluid wave, hepatomegaly, splenomegaly or mass.      Tenderness: There is abdominal tenderness in the right upper quadrant. There is no guarding. Negative signs include Murphy's sign.   Musculoskeletal:         General: No swelling.      Cervical back: Neck supple.      Right lower leg: No edema.      Left lower leg: No edema.   Lymphadenopathy:      Upper Body:      Right upper body: No supraclavicular adenopathy.   Skin:     General: Skin is warm and dry.  Capillary Refill: Capillary refill takes less than 2 seconds.      Coloration: Skin is not jaundiced.      Findings: No ecchymosis or rash.   Neurological:      Mental  Status: She is alert and oriented to person, place, and time.   Psychiatric:         Attention and Perception: Attention normal.         Mood and Affect: Mood is not anxious or depressed.         Speech: Speech is not rapid and pressured.         Behavior: Behavior is not agitated. Behavior is cooperative.         Cognition and Memory: Cognition is not impaired. Memory is not impaired.         IMPRESSION: Ms. Hur is a 48 y.o. female with   Diagnosis Orders   1. Carcinoid tumor of small intestine, unspecified location, unspecified whether malignant        2. LFT elevation        3. Right upper quadrant abdominal pain        4. Pancreatic insufficiency        5. IPMN (intraductal papillary mucinous neoplasm)        6. Anemia, unspecified type          Patient did not receive a call yet from the [facility] clinic  My physician assistant will call again to confirm Dr. Cloretta Ned has made the referral on January 3  Until then we will be on standby  Told her if the abdominal pain get any worse just come to the emergency room will admit her and will take it from there at that time    Medication where reviewed, side effects from the GI medication were reviewed with the patient  We did refill the GI medications        Diet/life style/natural hx /complication of the dx were all explained in details   Past medical, past surgical, social history, psychiatric history, medications or allergies, all reviewed and  updated    Thank you for allowing me to participate in the care of Ms. Richwine. For any further questions please do not hesitate to contact me.    I have reviewed and agree with the ROS entered by the MA/RN.     Note is dictated utilizing voice recognition software. Unfortunately this leads to occasional typographical errors. Please contact our office if you have any questions.  This note is created with the assistance of the speech recognition program. While intending to generate a document that actually reflects the  content of the visit, it can still have some errors including those of syntax and sound-a-like substitutions which may escape proof reading. Actual meaning can be extrapolated by contextual inference.    Marcia Brash, MD  [facility]  O: [phone]

## 2023-08-14 ENCOUNTER — Inpatient Hospital Stay: Admit: 2023-08-14 | Discharge: 2023-08-14 | Payer: PRIVATE HEALTH INSURANCE | Primary: Nurse Practitioner

## 2023-08-14 VITALS — BP 132/82 | HR 69

## 2023-08-14 DIAGNOSIS — D3A019 Benign carcinoid tumor of the small intestine, unspecified portion: Secondary | ICD-10-CM

## 2023-08-14 MED ORDER — OCTREOTIDE ACETATE 30 MG IM KIT
30 | Freq: Once | INTRAMUSCULAR | Status: AC
Start: 2023-08-14 — End: 2023-08-14
  Administered 2023-08-14: 14:00:00 30 mg via INTRAMUSCULAR

## 2023-08-14 MED FILL — SANDOSTATIN LAR DEPOT 30 MG IM KIT: 30 MG | INTRAMUSCULAR | Qty: 1

## 2023-08-14 NOTE — Plan of Care (Signed)
 Problem: Safety - Adult  Goal: Free from fall injury  Outcome: Completed

## 2023-08-14 NOTE — Progress Notes (Signed)
Pt arrives ambulatory for Sandostatin Injection  Pt denies complaints or concerns  Injection complete without incident and patient discharged in stable condition  Next appt  2/28 MD visit with injection to follow

## 2023-08-18 NOTE — Telephone Encounter (Signed)
Name: Monica Wood  DOB: February 29, 1976  MRN: [id number]    Oncology Navigation Follow-Up Note    Contact Type:  Telephone    Notes: Writer called pt to check on her and to see how her tx's are going. No answer, VM left requesting a return call.      Electronically signed by Lenard Simmer, RN on 08/18/2023 at 3:15 PM

## 2023-08-24 NOTE — Telephone Encounter (Signed)
Name: Monica Wood  DOB: Feb 10, 1976  MRN: [id number]    Oncology Navigation Follow-Up Note    Contact Type:  Telephone    Notes: Writer received a message from pt stating she has her appt in [place] this week. Will continue to follow.      Electronically signed by Lenard Simmer, RN on 08/24/2023 at 12:22 PM

## 2023-08-27 ENCOUNTER — Encounter

## 2023-08-27 NOTE — Progress Notes (Unsigned)
 Menstrual History    How many days do your periods typically last? n/a  Do you use pads, tampons, or both?  N/a  How many pads do you typically use in a 24hr period? n/a  How many tampons do you typically use in a 24hr period? n/a    Are your menstrual cycles regular (every 21-35 days)? n/a  Do you have bleeding or spotting between periods? n/a  Do you have pain with your periods? n/a  Do you have pain in your lower abdomen or pelvis other than painful periods? n/a  If you are in menopause, have you experienced any further vaginal bleeding? no    Personal Medical History    Do you have to take antibiotics before dental work or a procedure? no    Sexual History    Are you sexually active? yes (If yes, please circle. With a man/ women/ both) n/a    Do you have pain w/ intercourse? no  Do you have bleeding during/after intercourse?no  Are you satisfied w/ your current sex life?no  Are you using birth control? no   If yes what method? n/a  Have you ever had a sexually transmitted disease? no   If yes, please explain n/a

## 2023-08-28 MED ORDER — FAMOTIDINE 40 MG PO TABS
40 | ORAL_TABLET | Freq: Every evening | ORAL | 3 refills | Status: AC
Start: 2023-08-28 — End: ?

## 2023-08-28 MED ORDER — CREON 36000-114000 UNITS PO CPEP
36000-114000 | ORAL_CAPSULE | ORAL | 3 refills | Status: AC
Start: 2023-08-28 — End: ?

## 2023-09-02 ENCOUNTER — Ambulatory Visit: Admit: 2023-09-02 | Discharge: 2023-09-02 | Payer: PRIVATE HEALTH INSURANCE | Primary: Nurse Practitioner

## 2023-09-02 VITALS — BP 112/42 | HR 98

## 2023-09-02 DIAGNOSIS — N3946 Mixed incontinence: Secondary | ICD-10-CM

## 2023-09-02 LAB — POCT URINALYSIS DIPSTICK W/O MICROSCOPE (AUTO)
Bilirubin, UA: NEGATIVE
Blood, UA POC: NEGATIVE
Ketones, UA: NEGATIVE mg/dL
Leukocytes, UA: 25
Nitrite, UA: NEGATIVE
Spec Grav, UA: 1.015
pH, UA: 7

## 2023-09-02 NOTE — Progress Notes (Signed)
 [facility], [facility]  [facility] [place]. [facility]YN[facility]VIC [facility]   [address] [place] [address]  Dept: [phone]  Date: 09/02/2023  Patient Name: Monica Wood    VISIT - NEW PATIENT VISIT     CC: had concerns including New Patient (To discuss low testosterone ).    HPI:  This is a 48 year old G4P2 new patient, here to discus low libido with interest in testosterone therapy. She also reports urinary concerns that she would like to address today.     She recently had blood work done for evaluation of her testosterone level, low. ALT and AST normal; however, the alkaline phosphatase was slightly high. She has been considering HRT therapy, amenorrhea for 15+ years (ablation) She has discussed this interest with HRT with her oncologist and she states that her oncologist believes that it is OK for her to take HRT. She has been experiencing some brain fog (but has been treating her ADHD - which has improved this). Also reporting fatigue, joint pain and pruritic ears. Joint pain is improved since surgery for removal of part of small intestine; she felt that this was causing a lot of inflammation in her body and removal of part of her small intestine helped this. She does report low libido, which is distressing to her. She does have a partner that she would like to be sexually active with; just lacking libido. She denies any pelvic pain or vaginal bleeding, no itching, burning or abn discharge. She denies pain with intercourse but does have some dryness at the start of intercourse. Her routine PCP is Levander Campion. She does not feel that her pap is up to date. She does have a history of abnormal pap with LEEP. Patient feels that her abnormal was more than 20 years ago.  She is having hot flashes and night sweats but these are improved in recent.     She does have some urinary incontinence, dribbling urine after emptying bladder. Patient states that  she wears poise pads. She has bothersome urinary urgency/ frequency. She will also leak urine during climax. She does feel a vaginal bulge. She does leak urine with laugh, cough, sneeze jump. Reporting bothersome urinary urge incontinence. She feels that she has a weak urinary stream but does not feel that she retains urine. She denies hematuria, nor frequent UTI. No prior bladder surgeries or procedures. No bowel concerns, although she does need to lean forward to empty her bowels. She does have ongoing bloating.       Past Surgical History:   Procedure Laterality Date    ABDOMINOPLASTY      COLONOSCOPY N/A 11/18/2022    COLONOSCOPY POLYPECTOMY SNARE BIOPSY performed by Marcia Brash, MD at [facility] OR    ESOPHAGOGASTRIC FUNDOPLICATION  2000    GALLBLADDER SURGERY      LAPAROTOMY N/A 11/19/2022    LAPAROTOMY EXPLORATORY.  SMALL BOWEL RESECTION.  INCISIONAL BIOPSY OF MESENTERIC LYMPH NODE performed by Lucious Groves, MD at [facility] OR    OTHER SURGICAL HISTORY      ablation    TUBAL LIGATION      UPPER GASTROINTESTINAL ENDOSCOPY N/A 11/18/2022    ESOPHAGOGASTRODUODENOSCOPY BIOPSY performed by Marcia Brash, MD at [facility] OR      Past Medical History:   Diagnosis Date    Anemia     Anxiety and depression     Arthritis  Breast pain     Bursitis     Carcinoid tumor (HCC)     Chronic diarrhea     Depression 2020    Digestive problems     Female genital prolapse, unspecified     Fibromyalgia     Irritable bowel syndrome 2001    Obesity 1985    Osteoarthritis 2019    Retention of urine     Scoliosis     STD (sexually transmitted disease)     Chlamydia in college    Tendinitis     Tension headache     Urinary incontinence     Vitamin D deficiency     Vitamin D deficiency       Social History     Tobacco Use    Smoking status: Never    Smokeless tobacco: Never   Vaping Use    Vaping status: Never Used   Substance Use Topics    Alcohol use: Not Currently     Alcohol/week: 4.0 standard drinks of alcohol      Types: 4 Glasses of wine per week     Comment: 3/4 times a week    Drug use: Yes     Comment: uses gummies periodically for pain control      Family History   Problem Relation Age of Onset    Diabetes Maternal Grandmother     Diabetes Father     Diabetes Mother     Allergy (Severe) Mother     Anemia Mother     Arthritis Mother     Depression Mother     Other Mother         ADHD    Breast Cancer Neg Hx     Ovarian Cancer Neg Hx     Endometrial Cancer Neg Hx     Uterine Cancer Neg Hx     Cervical Cancer Neg Hx     Vaginal Cancer Neg Hx     Colon Cancer Neg Hx         Topics of Prolapse, Pain, Pressure were reviewed including type, period of onset, level of severity, quality and quantity, associations, trends, exacerbators, alleviators, bleeding, prolapse to reduce, splinting, and prior treatment / surgery.    Topics of Urinary Leakage were reviewed including type, period of onset, level of severity, quality and quantity, associations, trends, exacerbators, alleviators, urgency, frequency, nocturia ,urge incontinence / stress incontinence triggers, hesitancy, obstruction with need to catheterize, frequent UTI"s >3 in a year diagnosed by culture, hematuria (gross or microscopic), urinary stones, and prior treatment / surgery.    Topics of Fecal Incontinence were reviewed including type, period of onset, level of severity, quality and quantity, associations, trends, exacerbators, alleviators, times per day/week, stool quality / quantity, rectal bleeding, hemorrhoids, constipation / Anismus / Proctalgia fugax, laxative abuse, and prior treatment / surgery.    Topics of General Gynecology were reviewed including gravity/parity, #SVD's, perineal delivery trauma, LMP, days of flow, heaviest days, # pads / tampons per day, cramps, anemia, discharge, prior STI's, last pap smear, colposcopy, & other screenings including mammogram, dexascan, & colonoscopy as necessary.      ROS:  Review of Systems   Endocrine: Positive for  heat intolerance.   Genitourinary:  Positive for frequency and urgency.   Musculoskeletal:  Positive for myalgias.   Psychiatric/Behavioral:  Positive for sleep disturbance.        All other systems negative.    PHYSICAL EXAM:    BP (!) 112/42 (Site: Right  Upper Arm, Position: Sitting, Cuff Size: Large Adult)   Pulse 98   SpO2 (!) 66%   Objective      Physical Exam  Constitutional:       Appearance: Normal appearance. She is normal weight.   Genitourinary:      Bladder, rectum and urethral meatus normal.      No lesions in the vagina.      Right Labia: No rash, tenderness, lesions or skin changes.     Left Labia: No tenderness, lesions, skin changes or rash.     No vaginal tenderness.      No vaginal prolapse present.     No vaginal atrophy present.       Right Adnexa: not tender and no mass present.     Left Adnexa: not tender and no mass present.     No cervical lesion.      Uterus is not enlarged or tender.      No uterine mass detected.     No urethral tenderness or mass present.      Pelvic Floor: Levator muscle strength is 1/5.     Pelvic Floor comments: Pelvic cavity: Transvaginal digital palpation and Kegel's squeeze palpable.  Myalgia and myositis of the pelvic floor not present to contra-indicate a procedure.  No significant complications or exposures of any prior placed mesh.  No significant pelvic organ prolapse contraindicating a procedure.Marland Kitchen  HENT:      Head: Normocephalic and atraumatic.      Right Ear: External ear normal.      Left Ear: External ear normal.      Nose: Nose normal.      Mouth/Throat:      Mouth: Mucous membranes are moist.      Pharynx: Oropharynx is clear.   Eyes:      Extraocular Movements: Extraocular movements intact.      Conjunctiva/sclera: Conjunctivae normal.   Cardiovascular:      Rate and Rhythm: Normal rate and regular rhythm.   Pulmonary:      Effort: Pulmonary effort is normal.      Breath sounds: Normal breath sounds.   Abdominal:      General: Abdomen is flat. Bowel  sounds are normal.      Hernia: There is no hernia in the left inguinal area or right inguinal area.   Musculoskeletal:         General: Normal range of motion.      Cervical back: Normal range of motion and neck supple.   Neurological:      Mental Status: She is oriented to person, place, and time.   Psychiatric:         Mood and Affect: Mood normal.         Behavior: Behavior normal.   Vitals and nursing note reviewed.         ASSESSMENT/PLAN:    Urinary incontinence, mixed  -     POCT Urinalysis No Micro (Auto)  Menopausal symptoms  -     Follicle Stimulating Hormone; Future  -     Luteinizing Hormone; Future  -     Anti Mullerian Hormone; Future  Low libido  -     Follicle Stimulating Hormone; Future  -     Luteinizing Hormone; Future  -     Anti Mullerian Hormone; Future  Urinary urgency  -     US PELVIS COMPLETE; Future  Urinary frequency  -     US PELVIS COMPLETE; Future  Bloating symptom  -     US PELVIS COMPLETE; Future  Retention, urine  -     [place] POST VOID RESIDUAL   I would like to reach out to her care team to ensure OK to start HRT. Recommendation to start transdermal estradiol patch, oral Prometrium, and IM testosterone.   Recommend to work on home exercises and avoidance of bladder irritants, she would like to wait on PFT for now.   Recommend to follow up in 1 month for annual and to follow up on urinary concerns; readdress HRT.   UA and PVR reviewed, normal.       The patient was counseled regarding review of all conditions discussed.      2.   Educational handouts were discussed & given when applicable.     3.   Testing recommendations were given as indicated.     4.   Detailed questionnaires regarding the patient's specific condition were given to the patient when indicated. The patient understands that these are her responsibility to complete and return on a voluntary basis. Reminders to complete the questionnaires will not be given. The patient understands that failure to return the  questionnaires may not give the provider a full picture of the patient's urogynecologic issues and make treatment less than optimal despite the practitioner's best effort to obtain information.     Multiple records reviewed. All questions were addressed to the patient's satisfaction.    Additional procedural time (minutes) spent regarding today's visit:  50 Performing: ACVISITTIMEPERFORM: Pre-visit chart review and note construction, Extensive counseling, and Covering additional health topics on top of those listed in the chief complaint    Point of care: Medical decision making and point of care discussed with the team & supervising physician to qualify as a shared visit when applicable, supervising physician by proxy provider    Follow up: Return for 1 month follow up  - annual .     Electronically signed by Jeannette Corpus, APRN - CNP on 09/02/2023 at 4:59 PM    EMR / Voice Dictation Disclaimer: - This note is created with the assistance of a speech recognition program.  While intending to generate a timely document that accurately reflects the content of the visit, there is no guarantee every grammatical, syntax, or spelling error has been or will be identified or corrected.  Additionally, system limitations of the EMR and voice recognition software beyond the control of the practitioner may cause unintentional errors or omissions not identified or corrected at the time of record finalization and signature.

## 2023-09-02 NOTE — Patient Instructions (Addendum)
[place] [facility]    URGE SUPPRESSION EXERCISES    ? Do you rush to the bathroom for fear of losing urine?    ? Do you dribble urine on your way to the bathroom?        The feeling of urgency to get to the bathroom most likely is caused by bladder spasms.  Rushing to the bathroom during spasms or urge causes your bladder to bounce.  This causes more bladder spasms.  Learning urge suppression may help you control and / or eliminate these spasms so that you can stop the feeling of urgency and walk to the bathroom calmly.    To stop the feeling of urgency to urinate:    1. Remain in the same position in which you began having the feeling to urinate.  If You are sitting, remain   seated.  If you are walking, stop walking but remain standing.    2. Tighten your rectum, then let go a little, tighten again and let go a little.  Keep doing this until the urge   feeling has passed (about 15 - 30 seconds).By tightening and letting go of your rectum, you stimulate a   nerve in your Pelvic muscles which causes the bladder to relax.  This stops the strong feeling to urinate.    3. Tell yourself that you are in control of your bladder - not the other way around.    4. Once the urge is over, take a deep breath and relax.  Then walk to the bathroom calmly.              [place] [facility]    Strengthening Exercises for Pelvic Floor Muscles      Introduction  Pregnancy, childbirth, obesity and excessive straining from frequent constipation can weaken a woman's pelvic floor muscles.    Weak pelvic floor muscles can cause urinary incontinence or loss of urine when pressure is exerted on the bladder (for example, by coughing, laughing, running, jumping or lifting).   This is called stress incontinence.  Aging and decreased levels of estrogen during and following menopause also can contribute to urinary incontinence.    Many women with urinary incontinence can  decrease urinary leakage by exercising their pelvic floor muscles (the area around the vagina, urinary opening, and rectum). This exercise, commonly known as Kegel exercise, involves contracting or squeezing and then releasing the pelvic floor muscles in order to strengthen them.    Muscles to Exercise    Before doing pelvic floor muscle strengthening exercises, you need to identify your pelvic floor muscles.  Your healthcare provider can help you identify the correct muscles to contract.  At home, you can identify these muscles by imagining that you are trying to stop from passing gas.  Tighten (squeeze and lift) the muscles you would use.  You should feel a "squeezing" or "closing" sensation at the bladder and rectal outlets as well as around the vagina.  You should also feel a sensation of "lifting" or "drawing in" through the pelvic floor - LIKE SIPPING A STRAW UPWARD.  You should not feel any movement in your abdomen, legs, or buttocks.  Completely relax the muscles between each tightening.  You should feel "releasing" or "letting go" sensation of the muscles you just tightened.  The muscles should feel like they dropped naturally.  Do not push the muscles.    Exercise Instructions    1. Initially, empty  your bladder, and then find a quiet place to do your exercises.  This will help you to focus on doing the exercise correctly.  Once you are familiar with these exercises, you will not need to empty your bladder unless it makes your feel more comfortable, and you may do your exercises anywhere.  If you are doing the exercise correctly, no one can see you exercising these internal muscles.    2. Get into a comfortable seated, standing or lying positions as directed by your healthcare provider.  Tighten your pelvic floor muscles.    3. Continue to breathe regularly; do not hold your breath.    4. Hold the contraction strong and steady for ______seconds.    5. Release the contraction and relax your pelvic floor  muscles for ______ seconds.    6. Repeat the exercise ______ times.    7. Exercise your pelvic floor muscles ______ times a day.    In addition, you may be asked to do several quick contractions in a row.  Tighten the pelvic floor for 1 to 2 seconds, then relax.  Do ______quick contractions, ______ times a day.    Please note:  If these blanks are not completed, ask you healthcare provider to complete them.    Things to Remember    Use only your pelvic floor muscles when doing your exercises.  You may place your hand on your abdomen while exercising.  If you feel your abdomen move, you are doing the exercises incorrectly,  Do not do pelvic floor muscle exercises while urinating.  It may cause difficulties in emptying your bladder.  Although initially you may not be able to hold the contraction for more than a second, regular practice will allow you to increase the length of time you are able to hold your contractions.  Contract your pelvic floor muscles before any event which causes you to leak urine (nose blowing, sneezing, coughing, laughing).  For example, as you feel the urge to cough, tighten your pelvic floor muscles to prevent urine leakage before and during your cough.  After the first few days of exercising your pelvic floor muscles, you may notice some soreness around your pelvic floor muscle area.  If the soreness becomes too uncomfortable, talk to your healthcare provider.  Your healthcare provider may suggest biofeedback, electrical stimulation or vaginal cones.  Biofeedback equipment allows you to identify which muscles to use and to see the strength and duration of your muscle contractions.  Electrical stimulation uses mild electrical impulses to stimulate pelvic floor muscle contractions.  Vaginal cones help to improve muscle strength.  You insert a tampon-shaped cone in your vagina and hold it in place by contracting your pelvic floor muscles.  As the muscles grow stronger, you increase the weight  of the cone.    Seeing and Maintaining Results    After about six to 12 weeks of doing pelvic floor muscle strengthening exercises, you should notice improvement in pelvic floor muscle strength and a decrease in urinary leakage.  Once your pelvic floor muscles have strengthened,  you will not need to do this exercise as rigorously.  However, the benefits of pelvic floor muscle exercises will continue only as long as you do the exercise.  Make pelvic floor muscle exercises a lifelong practice.         [place] [facility]    DIET & DAILY HABITS  Can this affect your bladder or bowel control?  There is no "diet" to cure urinary or fecal problems.  However, there are certain dietary matters you should be concerned about.    Many people who have bladder control problems reduce the amount of liquids they drink in hope they will urinate less often.  Although less fluid intake results in less urine production, the smaller amount is more concentrated and thus, is more irritating to the bladder surface.  Highly concentrated urine (dark yellow, strong-smelling) urine may cause you to go to the bathroom more frequently.  It also encourages the growth of bacteria, which may lead to a urinary tract infection.  Do not restrict fluids to control incontinence without advice from your physician.  Always follow your health care provider's instructions.    Some foods cause urine to smell bad or peculiar.  The most notable food is asparagus.  Another cause of foul-smelling urine, and the most dangerous cause, is urinary tract infection.  If you notice that your urine has a strong odor and you have not eaten any foods that would cause this, you should see a health care provider and have a specimen of your urine tested for infection.    Some medicines may cause your urine to be discolored or have an unusual odor.  Some are medicines that you take for bladder inflammation or for urine tests.  If your  urine has a peculiar color or odor, consult the pharmacist who filled your prescription.    Some foods and beverages are though to contribute to bladder leakage.  Their effect of the bladder is not always understood, but you may want to see if eliminating one or all of the items listed improves your urine control.      Common irritants include: alcohol, carbonated beverages, caffeine, milk, coffee (even decaffeinated), tea, medicines with caffeine, citrus juice and fruits, tomatoes, tomato-based products, spicy foods, sugar, honey, chocolate, corn syrup, and artificial sweetener.    Grape juice, cranberry juice, cherry juice, apple juice, and apricot nectar are thirst-quenchers that usually are not irritating to the normal bladder.  Cranberry juice (or tablets) and cherry juice may help control urine odor.  The best beverage is water.  A very thin slice of lemon may improve water taste and enjoyment without adding acidity.    Pelvic muscle exercises are recommended to help patients maintain bladder control throughout their lifetime.  Patients are subject to irritation of the lower urinary tract system, because the urethra (tube from the bladder to the outside of the body) is very short.     They should avoid nylon underpants, thong underwear, and pantyhose next to their body.  Cotton underpants are preferable.  Patients should avoid colored ad perfumed toilet tissue and sanitary napkins because dyes and perfumes may be irritating.  They should not use detergent or bath additives.  Some urologists suggest that patients never sit in a bathtub and always take a shower.    If you have symptoms that feel like an infection such as bladder pain, urgency, and frequency; but your doctor says you do not have an infection, you should see your health care provider.  The information in this handout is directed towards people with bladder leakage, not to people whose main problem is irritative voiding symptoms.    Constipation  could be a cause of your bladder control problems.  When the rectum is full of stool, it may disturb the bladder and cause the sensation of urgency and frequency.  If you have a history of  constipation or have recently become constipated, see your health care provider.  Constipation may be caused by the medicines you are taking, a "sluggish bowel," or other conditions.  Most people in [place] society should add more bulk to their diet in the form of a high-fiber diet, fiber additives, or bulking agents.  Discuss your need for fiber with your health care provider.  When you add fiber to your diet, it is important not to restrict your fluid intake.  Also, you may note that you feel bloated in the beginning.  This discomfort will be temporary.      If you are constipated, this special recipe will help:     1 cup applesauce  1 cup oat bran   cup prune juice  Spices as desired (nutmeg, cinnamon)    This recipe may be stored in your refrigerator or in the freezer.  Pre-measured servings may be frozen in sectioned ice cube trays, and thawed as needed.  Begin with two tablespoons each evening followed by one 6-8 ounces glass of water or juice.   After 7-10 days increase this to three tablespoons.  At the end of the second to third week increase it to four tablespoons.  You should begin to see an improvement in your bowel habits in two weeks.  You should make this a part of your daily routine for your lifetime.  It is good for you!    When you begin using the special recipe, remember it is high fiber.  You may be troubled by gas and bloating, but this should go away in several weeks.    Obesity is a dangerous health problem.  It also contributes to incontinence in females.  Some women notice improved bladder control when they lose weight.    Cigarette smoking is irritating to the bladder surface.  Smoking is also associated with bladder cancer.  Coughing associated with smoking may lead to stress incontinence during  coughing spasms.  You should stop smoking today for these and many other reasons.    In older people and people limited by arthritis or other disabilities, it is important to:    -     Use the toilet regularly - every 2  to 3  hours.  Wear clothes that are easy to get off when it is time to go to the toilet.  Remain on the toilet until your bladder is empty.  If you feel there is still some urine in the bladder, stand up and then sit back down again and lean forward slightly over the knees.  This is called "double voiding" and may help you empty your bladder.  Make the toilet facilities convenient.  This may mean a bedside commode, bedpan, or urinal placed conveniently near the bed.  Empty your bladder before you start on a journey of an hour or more.  Do not try to "wait until I get home to my own bathroom."     [place] [facility]    Dietary Irritants to the Urinary Tract    If your bladder symptoms are related to dietary factors, strict adherence to a diet which eliminates certain food products should bring significant relief in 10 days.  The proof is resuming your old dietary habits followed by the return of your symptom complex.  Once you are feeling better, you can begin to add these things back into your diet, it is crucial that you maintain a significant water intake.  Water should  be the majority of what you drink every day (approximately 1-2 quarts a day).    Foods to be avoided:     all alcoholic beverages  chocolate   apples, apple juice   grapes   NutraSweet    guava   cantaloupe    vitamin E can be taken if powdered   carbonated beverages  peaches, pineapples, and plums   chilies / spicy food   citrus foods including lemons    coffee, tea (includes decaf)  tomatoes   strawberries, cranberries  onions   vinegar    vitamin B complex (B6 is okay)    Substitutions that you can make in your daily diet:    1.    Low acid Coffee.  Kava, &cold-brewed coffee are two  options.   2.    Herbal teas -citrus free.  Weak tea is also an alternative; dunk a tea bag          in water 4 times quickly to color the water.  Sun brewed tea.  3.    Carob for chocolate.  Ovaltine instead of chocolate drink.  4.    Fruit juices such as apricot or pear nectar, papaya and watermelon juice.  Late harvest dessert wines.  Splenda instead of NutraSweet or saccharin.  Orange or lime peel for flavor.  Don't use white part of rind.  Pine nuts in place of other types of nuts.  Consider wheat allergy: use breads made with potato, soya, or rice flour.  Vitamins: vitamin C in calcium ascorbate co-buffered with calcium carbonate  Counseling on Hormonal Based Menopausal Management:    The patient was seen and counseled on all forms of hormonal and non-hormonal based medical management of her menopausal symptoms. TThe patient understands the benefits of hormonal birth control most commonly including but not limited to decreased menstrual irregularity and bleeding, decreased hot flashes, decreased vaginal dryness, PMS/PMDD symptoms reduction, and cardiovascular and osteoporosis protection. he patient is aware that hormonal based medications may increase her risk of developing a blood clot which may increase her morbidity and or mortality. The risk is much less than with oral contraceptive therapy at 1/6 the dosage concentration, yet remains about 1-10,000. The patient was counseled on alternate non hormonal based medication options.  We discussed that smoking and any hormonal based medication may increase the patients risks of developing these life threatening blood clots. All patients are encouraged to stop smoking at the time of hormonal-based medication counseling.  Cessation programs were reviewed. If a patient is heterozygous for a familial clotting disorder or has a remote history of superficial thrombosis related to noncoagulopathic hematologic disease, a daily baby aspirin was recommended as a  precautionary middle ground measure for thrombotic prevention.    The life threatening side effect profile was reviewed in detail this includes but is not limited to shortness of breath, chest pain, severe or persistent headaches, or calf pain.  If any of these occur the patient has been instructed to stop using her hormonal based medication, notify the office, and go to the emergency department or call 911.    The patient denied any personal history of blood clots in her leg, lung, or heart and denied any family history of stroke, TIA, sudden cardiac death < 40 y.o.,pulmonary embolism, or deep venous thrombosis.    An HRT consent will be signed if the patient wishes to commence HRT.    PATIENT EDUCATION  Hormone replacement therapy can be either estrogen alone (called  estrogen replacement therapy, or ERT), or estrogen and progesterone combined. This combination is referred to as hormone replacement therapy (HRT). Progesterone is usually given in the form of progestins, which are synthetic forms of the naturally occurring hormone progesterone. While once widely used, HRT now has a more limited role because of concerns about its safety.   Medications and Their Commonly Used Names   Estrogen is most commonly given in these forms:   Pill or tablet   Vaginal cream   Vaginal ring insert   Patch   Skin gel   Progestin is available in these forms:   Pill (can be combined with estrogen)   Intrauterine device (IUD)   Vaginal capsule   Injection   Implant   Skin gel     What This Medication Is Prescribed For   To Ease Menopausal Symptoms   Hot flashes   Sleep disturbance   Vaginal dryness     To Reduce the Risk of Osteoporosis and Related Fractures   Estrogen is important for bone health. When the natural supply of estrogen drops off with menopause, HRT can help protect bones by replacing estrogen.     How This Medication Works   The hormones provided with HRT are meant to replace the natural hormones that a woman's body no  longer produces after menopause. Estrogen is involved in many functions in the body, and therefore, HRT is believed to provide the following benefits:   Reduce the symptoms of menopause   Helps to slow or prevent the bone loss that occurs with aging and increases after menopause, in order to help delay osteoporosis   Helps to reduce the risk of colorectal cancer     Precautions While Using This Medication   Long-term use of HRT (estrogen plus progestin) may significantly increase women's risks of breast cancer , strokes , heart attacks , and blood clots. ERT may also increase the risk of ovarian cancer . HRT has been associated with an increased risk of gastroesophageal reflux disease (GERD).   For many women the risks of HRTespecially when used long-termmay outweigh the benefits, so the decision to use HRT should be carefully considered and discussed with your healthcare provider.   Women with the following conditions are usually advised not to take HRT:   Unexplained vaginal bleeding   Liver disease   Kidney disease   High levels of triglycerides (a type of fat in the blood)   History of blood clots in the veins   History of breast or ovarian cancer   History of cardiovascular disease   History of stroke   many other conditions (ask your doctor if any of your medical conditions increase the risks of taking HRT)     Proper Usage and Missed Dose   You and your doctor will determine the dosing schedule that is best for you. You should see your doctor at least once a year while taking HRT to discuss the effects and review your decision. The [place] Food and Drug Administration (FDA) recommends that women who decide to use HRT for menopausal symptoms use the lowest possible dose for the shortest time needed.   T  here are two general schedules for taking HRT in pill form:   Cyclic or sequentialEstrogen is taken every day for a set number of days. A higher dose (than that used in continuous doses) of progestin is given for  10-14 days. One or both hormones are stopped for a specified period of time. This  pattern is repeated every month, and it causes regular monthly bleeding like a light menstrual period.   ContinuousLow-dose estrogen and progestin are taken together every day of the month without any break. Vaginal bleeding often occurs, sometimes for up to a year when this schedule is first started, and can vary from light spotting to irregular menstrual-type bleeding.     Take estrogen at the same time every day to minimize side effects. If you are using an estrogen skin patch, be sure to read the application directions carefully before using.     Missed Dose   Pill formIf you miss a dose, take it as soon as possible. However, if it is almost time for your next dose, skip the missed dose and go back to your regular dosing schedule. Do not double doses.   Skin patchIf you forget to apply a new patch when you are supposed to, apply it as soon as possible. However, if it is almost time for the next patch, skip the missed one and go back to your regular schedule. Always remove the old patch before applying a new one. Do not apply more than one patch at a time.     Possible Side Effects   The following side effects may disappear over time as your body adjusts to taking HRT. Also, your doctor may be able to change the amount of hormone you receive, the way it is taken, or the timing of the dose, in order to help minimize these effects:   Bloating   Breast tenderness   Cramping   Irritability   Depression   Return of monthly periods   Swelling of feet and lower legs   Rapid weight gain     HRT can also cause some very serious side effects. You should discuss your specific health status and risks with your doctor when deciding whether or not to use HRT. These serious side effects include the following:     Endometrial Cancer   For a woman who has not had her uterus removed (via a hysterectomy ), taking estrogen alone (ERT) can lead to  cancer of the endometrium (the lining of the uterus). However, this risk can be avoided by taking both estrogen and progestin in the form of HRT. A woman who has had her uterus removed cannot develop endometrial cancer so she can take ERT.     Breast Cancer   Some studies have suggested that women who take HRT and ERT are at greater risk for developing breast cancer. A major study on HRT, the [facility], found that invasive breast cancer was more common among women on long-term HRT.     Blood Clots   Both HRT and ERT slightly increase the risk of developing blood clots in veins. In the [facility] study, women who were long-term users of HRT had twice the number of blood clots as the women who were not taking HRT.     Cardiovascular Disease   Although HRT was previously believed to reduce the risk of cardiovascular disease, it appears that long-term use of HRT may actually increase this risk. In the [facility] study on HRT, women on long-term HRT had increased risk of heart disease and strokes.     Ovarian Cancer   Women on ERT may be at higher risk of ovarian cancer.     Other Uses for This Medication   To treat the following:   Osteoporosis caused by lack of estrogen before  menopause   Turner's syndrome (a genetic disease)     With every medicine, there are important precautions to consider. These include allergies, interactions with other drugs and medical conditions, and safety in certain age groups.  Marland Kitchen

## 2023-09-03 ENCOUNTER — Inpatient Hospital Stay: Payer: PRIVATE HEALTH INSURANCE | Primary: Nurse Practitioner

## 2023-09-03 DIAGNOSIS — N951 Menopausal and female climacteric states: Secondary | ICD-10-CM

## 2023-09-04 LAB — LUTEINIZING HORMONE: LH: 5.1 m[IU]/mL (ref 1.7–8.6)

## 2023-09-04 LAB — FOLLICLE STIMULATING HORMONE: FSH: 7.1 m[IU]/mL

## 2023-09-09 LAB — ANTI MULLERIAN HORMONE: Anti-Mullerian Hormone: 0.175 ng/mL — ABNORMAL HIGH (ref <=0.064)

## 2023-09-10 ENCOUNTER — Telehealth

## 2023-09-10 NOTE — Telephone Encounter (Signed)
 Per message from [place], patient cancelling injections. She told him her [facility] doctor to hold on them at this time. She is keeping her Dr. Cloretta Ned appointment on 09/11/23. I called to let patient know that I did cancel the appointment and to call us back when told to resume.

## 2023-09-10 NOTE — Telephone Encounter (Signed)
 Please call Ericka and let her know that I received an OK to begin HRT from her oncologist. Does she want to schedule an appointment to further discuss HRT to get started? Or would she like for me to just go ahead and send in estradiol patch, oral Prometrium and IM testosterone - as discussed at our visit. She may just start with testosterone if she only wants that.

## 2023-09-10 NOTE — Telephone Encounter (Signed)
-----   Message from [place], [place] sent at 09/10/2023  4:21 PM EST -----  Discussed w Dr Geronimo Boot - we do not have any reason against HRT for this patient at this time. She should keep her regularly scheduled f/u with our office. Thanks!  ----- Message -----  From: Jeannette Corpus, APRN - CNP  Sent: 09/02/2023   5:05 PM EST  To: Marcia Brash, MD; Prudence Davidson, MD    Hello. I saw this patient today for discussion of HRT. She is interested in starting. I would like your recommendation if you feel this patient is safe to be on HRT, based on her medical history - transdermal estradiol, oral prometrium, and IM testosterone.     Thank you

## 2023-09-11 ENCOUNTER — Ambulatory Visit: Payer: PRIVATE HEALTH INSURANCE | Primary: Nurse Practitioner

## 2023-09-11 ENCOUNTER — Ambulatory Visit
Admit: 2023-09-11 | Discharge: 2023-09-11 | Payer: PRIVATE HEALTH INSURANCE | Attending: Internal Medicine | Primary: Nurse Practitioner

## 2023-09-11 VITALS — BP 100/68 | HR 61 | Temp 96.80000°F | Resp 18 | Wt 219.0 lb

## 2023-09-11 DIAGNOSIS — D3A019 Benign carcinoid tumor of the small intestine, unspecified portion: Secondary | ICD-10-CM

## 2023-09-11 NOTE — Telephone Encounter (Signed)
 Instructions   from Dr. Prudence Davidson, MD    Rv in April on the day pt gets octreotide shot     Gave pt info to [facility]) to have them schedule pts injection for the same day as rv    Rv scheduled on 10/16/2023 @8 :15; pt did not want their avs

## 2023-09-11 NOTE — Telephone Encounter (Signed)
 Spoke with patient and she stated that she is ok with you sending in the medication you and her talked about.

## 2023-09-11 NOTE — Patient Instructions (Signed)
 Rv in April on the day pt gets octreotide shot

## 2023-09-11 NOTE — Progress Notes (Signed)
 Monica Wood                                                                                                                  09/11/2023  MRN:   [id number]  Date of Birth:  April 08, 1976  PCP:                           Monica Campion, APRN - CNP  Referring Physician: No ref. 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  Patient underwent evaluation at [facility].  Scheduled to have scans next week.  Patient has decided to hold off any surgical intervention due to potential long-term complications such as short gut syndrome.  Still has right-sided abdominal pain at times.  Diarrhea is controlled.  Patient is considering taking hormone replacement therapy through gynecology team.    During this visit patient's allergy, social, medical, surgical history and medications were reviewed and updated.    Summary of Case/History:  Monica Wood a 48 y.o.female is a patient with unresectable well-differentiated endocrine tumor of small bowel presents to the clinic to establish care for further workup and management.  Patient complains of having abdominal pain .  Presented to the ER underwent imaging which showed soft tissue mass in the ventral mesentery anterior to the aorta concerning for neoplasm.  Patient underwent EGD and colonoscopy by GI which showed a 1 cm polyp in the colon but no other significant abnormality.  Subsequently patient was taken  to the OR for exploratory laparotomy small bowel resection and lymph node dissection.  Pathology from the surgery showed multiple well-differentiated neuroendocrine tumors largest measuring 2.8 cm in the mid small bowel resection.  There was also metastasis noted to the lymph nodes.  Patient had a grade 1 disease.  Ki-67 was 1-2%.  Pathological stage was T3 N1.  Patient is recovering from the surgery.  She has a PET scan scheduled next week.  Presents to the clinic to establish care and for further workup.  Patient also noted to have microcytic anemia.  Iron studies done earlier show adequate iron stores.  Patient also complains of hoarseness of voice which has been going on for a while.    Past Medical History:   Past Medical History:   Diagnosis Date    Anemia  Anxiety and depression     Arthritis     Breast pain     Bursitis     Carcinoid tumor (HCC)     Chronic diarrhea     Depression 2020    Digestive problems     Female genital prolapse, unspecified     Fibromyalgia     Irritable bowel syndrome 2001    Obesity 1985    Osteoarthritis 2019    Retention of urine     Scoliosis     STD (sexually transmitted disease)     Chlamydia in college    Tendinitis     Tension headache     Urinary incontinence     Vitamin D deficiency     Vitamin D deficiency      Past Surgical History:  Past Surgical History:   Procedure Laterality Date    ABDOMINOPLASTY      COLONOSCOPY N/A 11/18/2022    COLONOSCOPY POLYPECTOMY SNARE BIOPSY performed by Marcia Brash, MD at [facility] [place] [facility]    ESOPHAGOGASTRIC FUNDOPLICATION  2000    GALLBLADDER SURGERY      LAPAROTOMY N/A 11/19/2022    LAPAROTOMY EXPLORATORY.  SMALL BOWEL RESECTION.  INCISIONAL BIOPSY OF MESENTERIC LYMPH NODE performed by Lucious Groves, MD at [facility]    OTHER SURGICAL HISTORY      ablation    TUBAL LIGATION      UPPER GASTROINTESTINAL ENDOSCOPY N/A 11/18/2022    ESOPHAGOGASTRODUODENOSCOPY BIOPSY performed by Marcia Brash, MD at [facility] [place] [facility]     Patient  Family Social History:  Family History   Problem Relation Age of Onset    Diabetes Maternal Grandmother     Diabetes Father     Diabetes Mother     Allergy (Severe) Mother     Anemia Mother     Arthritis Mother     Depression Mother     Other Mother         ADHD    Breast Cancer Neg Hx     Ovarian Cancer Neg Hx     Endometrial Cancer Neg Hx     Uterine Cancer Neg Hx     Cervical Cancer Neg Hx     Vaginal Cancer Neg Hx     Colon Cancer Neg Hx      Social History     Tobacco Use    Smoking status: Never    Smokeless tobacco: Never   Vaping Use    Vaping status: Never Used   Substance Use Topics    Alcohol use: Not Currently     Alcohol/week: 4.0 standard drinks of alcohol     Types: 4 Glasses of wine per week     Comment: 3/4 times a week    Drug use: Yes     Comment: uses gummies periodically for pain control     Current Medications:  Current Outpatient Medications   Medication Sig Dispense Refill    PREBIOTIC PRODUCT PO Take by mouth      lipase-protease-amylase (CREON) 36000-114000 units CPEP delayed release capsule 1 capsule TID with meals 270 capsule 3    famotidine (PEPCID) 40 MG tablet Take 1 tablet by mouth at bedtime 90 tablet 3    Calcium Carbonate-Vitamin D (OYSTER SHELL CALCIUM/D) 500-5 MG-MCG TABS Take 1 tablet by mouth      vitamin E 400 UNIT capsule Take 1 capsule by mouth daily      Cholecalciferol (VITAMIN D3) 1.25 MG (50000 UT) CAPS Take  by mouth      ferrous sulfate (IRON 325) 325 (65 Fe) MG tablet Take 1 tablet by mouth daily (with breakfast) 90 tablet 1    Lisdexamfetamine Dimesylate 40 MG CAPS Take 40 mg by mouth daily.      DULoxetine (CYMBALTA) 60 MG extended release capsule take 1 capsule by mouth once daily 90 capsule 1    albuterol sulfate HFA (PROVENTIL HFA) 108 (90 Base) MCG/ACT inhaler Inhale 2 puffs into the lungs every 6 hours as needed for Wheezing or Shortness of Breath 18 g 0    buPROPion (WELLBUTRIN XL) 150 MG extended release tablet Take 1 tablet by mouth every morning 90 tablet 1     tiZANidine (ZANAFLEX) 2 MG tablet Take 1 tablet by mouth daily as needed (back muscle) 30 tablet 1     No current facility-administered medications for this visit.     Allergies:   Oxycodone, Parsley leaves, Percocet [oxycodone-acetaminophen], Codeine, and Morphine    Review of Systems:    Constitutional: No fever or chills. No night sweats, no weight loss   Eyes: No eye discharge, double vision, or eye pain   HEENT: negative for sore mouth, sore throat, hoarseness and voice change   Respiratory: negative for cough , sputum, dyspnea, wheezing, hemoptysis, chest pain   Cardiovascular: negative for chest pain, dyspnea, palpitations, orthopnea, PND   Gastrointestinal: negative for nausea, vomiting, constipation, abdominal pain, Dysphagia, hematemesis and hematochezia.  Positive for abdominal discomfort.  Diarrhea improved  Genitourinary: negative for frequency, dysuria, nocturia, urinary incontinence, and hematuria   Integument: negative for rash, skin lesions, bruises.   Hematologic/Lymphatic: negative for easy bruising, bleeding, lymphadenopathy, or petechiae   Endocrine: negative for heat or cold intolerance,weight changes, change in bowel habits and hair loss   Musculoskeletal: negative for myalgias, arthralgias, pain, joint swelling,and bone pain   Neurological: negative for headaches, dizziness, seizures, weakness, numbness    Physical Exam:  Vitals: BP 100/68   Pulse 61   Temp 96.8 F (36 C) (Temporal)   Resp 18   Wt 99.3 kg (219 lb)   SpO2 99%   BMI 36.73 kg/m   General appearance - well appearing, no in pain or distress  Mental status - AAO X3  Eyes - pupils equal and reactive, extraocular eye movements intact  Mouth - mucous membranes moist, pharynx normal without lesions  Neck - supple, no significant adenopathy  Lymphatics - no palpable lymphadenopathy, no hepatosplenomegaly  Chest - clear to auscultation, no wheezes, rales or rhonchi, symmetric air entry  Heart - normal rate, regular rhythm,  normal S1, S2, no murmurs  Abdomen - soft, nontender, nondistended, no masses or organomegaly.  Positive healing surgical incision site  Neurological - alert, oriented, normal speech, no focal findings or movement disorder noted  Extremities - peripheral pulses normal, no pedal edema, no clubbing or cyanosis  Skin - normal coloration and turgor, no rashes, no suspicious skin lesions noted       DATA:  Results for orders placed or performed during the hospital encounter of 09/03/23   Anti Mullerian Hormone   Result Value Ref Range    Anti-Mullerian Hormone 0.175 (H) <=0.064 ng/mL   Follicle Stimulating Hormone   Result Value Ref Range    FSH 7.1 mIU/mL   Luteinizing Hormone   Result Value Ref Range    LH 5.1 1.7 - 8.6 mIU/mL     PET CT TUMOR IMAGE SKULL THIGH DOTATATE     Result Date: 07/13/2023  EXAMINATION:  WHOLE BODY PET/CT 07/10/2023 TECHNIQUE: Following IV injection of 4 mCi of Ga-68 FDG, PET  tumor imaging was acquired from the base of the skull to the mid thighs.  Computed tomography was used for purposes of attenuation correction and anatomic localization.  Fusion imaging was utilized for interpretation. Uptake time 51 min.  Glucose level not applicable mg/dl. COMPARISON: December 22, 2022 HISTORY: ORDERING SYSTEM PROVIDED HISTORY: Carcinoid tumor of small intestine, unspecified location, unspecified whether malignant TECHNOLOGIST PROVIDED HISTORY: Is the patient pregnant?->No Reason for Exam: Carcinoid tumor of small intestine, unspecified location, unspecified whether malignant, Microcytic anemia FINDINGS: HEAD/NECK: No abnormal radiotracer avid lesion is seen in the visualized basal portion of the head. No abnormal no radiotracer avidity is seen in the lymph nodes of the neck. No abnormal radiotracer accumulation is noted in the soft tissues of the nasopharynx, oropharynx, glottic, or subglottic compartments. No abnormal radiotracer accumulation is seen in the remainder of the soft tissues of the neck. The CT  portion of the study demonstrates no masses or lymphadenopathy in the neck. CHEST: No abnormal activity is seen in the hilum or mediastinum. No radiotracer avid lesion is seen in the lung parenchyma. The CT portion of the chest demonstrates no lung parenchymal nodules or masses. ABDOMEN: No abnormal radiotracer avid lesion is seen in the lymph nodes of the retroperitoneum,  porta hepatis, gastrohepatic ligament. A large mesenteric lymph node is present measuring 2.5 cm diameter with a max SUV of 53.1 and previously demonstrated a max SUV of 48.7 and measured 2.1 cm diameter.  Just craniad is another mesenteric lymph node which currently demonstrates a max SUV of 38 measuring 8 mm and previously demonstrated a max SUV of of 12.3 and measured 7 mm. No abnormal radiotracer accumulation is noted in the liver or the remainder of the intra-abdominal solid organs. CT portion of the study of the abdomen demonstrates no abnormality of the intra-abdominal solid organs. No abnormality of the stomach the remainder of the enteric tract. PELVIS: No iliac chain lymphadenopathy is seen. No other lymph nodes are seen in the pelvis. No abnormal metabolic activity is seen in the pelvic soft tissues. BONES/SOFT TISSUE: No abnormal radiotracer avid lesion is seen in the subcutaneous soft tissues. No abnormal radiotracer avid lesion is seen in the skeletal system. The CT portion of the study in bone window settings demonstrates no osteolytic or osteoblastic lesions. INCIDENTAL CT FINDINGS: Surgical clips are demonstrated in the right upper quadrant consistent with prior cholecystectomy.     Findings consistent with progression of metastatic disease with increased radiotracer avidity of the previously noted mesenteric lymph nodes and some increase in size from previous. 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  Reviewed records from outside facility  Seen by surgery at [facility] clinic.  Scans scheduled for next week.  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  Patient at this point has decided not to pursue surgery  and will continue the octreotide shots  She will also be started on probiotics  Continue octreotide at 30 mg dose every 4 weeks.  Will continue monitor for symptoms.  Will need surveillance for IPMN.  Will repeat MRI in 1 year, 06/02/2024  Continue oral iron supplementation  Patient is planning to start HRT through her gynecology team.  HRT will not interfere treatment with patient's neuroendocrine tumor.  Okay to take from oncology standpoint  NCCN guidelines were reviewed and discussed with the patient.  Hemoglobin electrophoresis unremarkable however alpha thalassemia not ruled out  The diagnosis and care plan were discussed with the patient in detail. I discussed the natural history of the disease, prognosis, risks and goals of therapy and answered all the patients questions to the best of my ability.  Patient expressed understanding and was in agreement.    Prudence Davidson, MD    This note is created with the assistance of a speech recognition program.  While intending to generate a document that actually reflects the content of the visit, the document can still have some errors including those of syntax and sound a like substitutions which may escape proof reading.  It such instances, actual meaning can be extrapolated by contextual diversion.

## 2023-09-14 ENCOUNTER — Encounter

## 2023-09-14 MED ORDER — DULOXETINE HCL 60 MG PO CPEP
60 | ORAL_CAPSULE | Freq: Every day | ORAL | 1 refills | Status: AC
Start: 2023-09-14 — End: ?
  Filled 2023-09-14: qty 30, 30d supply, fill #0

## 2023-09-14 MED ORDER — ESTRADIOL 0.0375 MG/24HR TD PTTW
0.0375 | MEDICATED_PATCH | TRANSDERMAL | 3 refills | Status: DC
Start: 2023-09-14 — End: 2023-10-13

## 2023-09-14 MED ORDER — BUPROPION HCL ER (XL) 150 MG PO TB24
150 | ORAL_TABLET | Freq: Every morning | ORAL | 1 refills | Status: DC
Start: 2023-09-14 — End: 2024-03-07
  Filled 2023-09-14: qty 30, 30d supply, fill #0

## 2023-09-14 MED ORDER — ALBUTEROL SULFATE HFA 108 (90 BASE) MCG/ACT IN AERS
108 | Freq: Four times a day (QID) | RESPIRATORY_TRACT | 2 refills | Status: AC | PRN
Start: 2023-09-14 — End: ?

## 2023-09-14 MED ORDER — PROGESTERONE 100 MG PO CAPS
100 | ORAL_CAPSULE | Freq: Every evening | ORAL | 1 refills | Status: DC
Start: 2023-09-14 — End: 2023-10-13
  Filled 2023-09-14: qty 30, 30d supply, fill #0

## 2023-09-14 MED ORDER — FAMOTIDINE 40 MG PO TABS
40 | ORAL_TABLET | Freq: Every evening | ORAL | 3 refills | Status: DC
Start: 2023-09-14 — End: 2023-10-12
  Filled 2023-09-14: qty 30, 30d supply, fill #0

## 2023-09-14 NOTE — Telephone Encounter (Signed)
 Sent both estradiol patch and oral Prometrium to [facility]. Testosterone rx sent to [facility]. She will need to pick up vial and bring to [place] for injection. Please schedule first IM testosterone injection.

## 2023-09-14 NOTE — Telephone Encounter (Signed)
 Last Visit Date: 07/30/2023  Next Visit Date: 01/28/2024

## 2023-09-16 ENCOUNTER — Encounter

## 2023-09-16 MED ORDER — CREON 36000-114000 UNITS PO CPEP
36000-114000 | ORAL_CAPSULE | ORAL | 11 refills | 14.00000 days | Status: DC
Start: 2023-09-16 — End: 2024-05-16
  Filled 2023-09-18: qty 90, 30d supply, fill #0

## 2023-09-16 NOTE — Telephone Encounter (Signed)
 Sending 1 yr refill creon

## 2023-09-17 ENCOUNTER — Encounter

## 2023-09-17 ENCOUNTER — Encounter: Admit: 2023-09-17 | Discharge: 2023-09-17 | Payer: PRIVATE HEALTH INSURANCE | Primary: Nurse Practitioner

## 2023-09-17 ENCOUNTER — Ambulatory Visit: Admit: 2023-09-17 | Discharge: 2023-09-17 | Payer: PRIVATE HEALTH INSURANCE | Primary: Nurse Practitioner

## 2023-09-17 DIAGNOSIS — N951 Menopausal and female climacteric states: Secondary | ICD-10-CM

## 2023-09-17 DIAGNOSIS — R3915 Urgency of urination: Secondary | ICD-10-CM

## 2023-09-17 NOTE — Progress Notes (Unsigned)
 Patient here for testosterone injection.  This is for low libido and low testosterone.  Hormone tests reviewed.  No liver issues.  No contraindications.  No chest pain shortness of breath or calf pain.  25 mg of Depo testosterone injected intramuscularly into the right buttock.    25 mg Depo testosterone IM right buttock NDC [id number] through [facility]     FU 1 month for repeat injection.

## 2023-09-17 NOTE — Progress Notes (Signed)
 See other note

## 2023-09-23 ENCOUNTER — Encounter: Payer: PRIVATE HEALTH INSURANCE | Attending: Internal Medicine | Primary: Nurse Practitioner

## 2023-09-23 ENCOUNTER — Inpatient Hospital Stay: Admit: 2023-09-23 | Discharge: 2023-09-23 | Payer: PRIVATE HEALTH INSURANCE | Primary: Nurse Practitioner

## 2023-09-23 VITALS — Temp 98.20000°F

## 2023-09-23 DIAGNOSIS — D3A019 Benign carcinoid tumor of the small intestine, unspecified portion: Secondary | ICD-10-CM

## 2023-09-23 MED ORDER — OCTREOTIDE ACETATE 30 MG IM KIT
30 | Freq: Once | INTRAMUSCULAR | Status: AC
Start: 2023-09-23 — End: 2023-09-23
  Administered 2023-09-23: 19:00:00 30 mg via INTRAMUSCULAR

## 2023-09-23 MED FILL — SANDOSTATIN LAR DEPOT 30 MG IM KIT: 30 MG | INTRAMUSCULAR | Qty: 1 | Fill #0

## 2023-09-23 NOTE — Progress Notes (Signed)
 Patient here for sandostatin injection  Arrives ambulatory.  Denies any new complaints.  Injection complete without incident.  Patient discharged in stable condition.  Returns 10/21/23 for MD visit.

## 2023-09-25 ENCOUNTER — Ambulatory Visit: Payer: PRIVATE HEALTH INSURANCE | Primary: Nurse Practitioner

## 2023-09-25 ENCOUNTER — Inpatient Hospital Stay: Admit: 2023-09-25 | Payer: PRIVATE HEALTH INSURANCE | Primary: Nurse Practitioner

## 2023-09-25 ENCOUNTER — Encounter

## 2023-09-25 VITALS — Ht 64.75 in | Wt 219.0 lb

## 2023-09-25 DIAGNOSIS — Z1231 Encounter for screening mammogram for malignant neoplasm of breast: Secondary | ICD-10-CM

## 2023-09-30 MED FILL — LISDEXAMFETAMINE DIMESYLATE 40 CAPS: 40 40 MG | 30 days supply | Qty: 30 | Fill #0 | Status: AC

## 2023-10-01 NOTE — Telephone Encounter (Signed)
 Name: Monica Wood  DOB: [birth year]  MRN: [id number]    Oncology Navigation Follow-Up Note    Contact Type:  Telephone    Notes: Writer received a call from pt stating she had a PET scan done in [place] last week and there were new findings. She's asking if Dr. Cloretta Ned would review PET and call her as she's very anxious having to wait for her [facility] f/u next Tuesday. Writer informed her that Dr Cloretta Ned was out of office and wont return until next Monday. Writer will route this note to Dr. Cloretta Ned to inform. Pt appreciative.      Electronically signed by Lenard Simmer, RN on 10/01/2023 at 2:25 PM

## 2023-10-06 ENCOUNTER — Telehealth

## 2023-10-06 NOTE — Telephone Encounter (Signed)
 Name: Monica Wood  DOB: 06/17/1976  MRN: [id number]    Oncology Navigation Follow-Up Note    Contact Type:  Telephone    Notes: Writer called pt to f/u with her regarding her PET scan that was done at [facility] and their recommendations. See copy of PET result and recommendations copied below. Will route this note to Dr. Cloretta Ned for direction on referral for OBGYN. Pt aware of plan and appreciative. Will continue to follow.      PET  IMPRESSION:     * Persistent multiple SSTR2 expressing mesenteric   lesions/lymphadenopathy.   * Persistent SSTR2 expressing central mesenteric nodule/mass.   *  Interval development of new SSTR2 expressing lesion in the vaginal   vestibule, correlate with physical exam/MRI for further evaluation.     Krenning Score: 4   *  Score 0: No abnormal uptake   *  Score 1: Very low uptake   *  Score 2: Uptake less than or equal to the liver   *  Score 3: Uptake greater than liver but less than spleen   *  Score 4: Uptake greater than the spleen         PLAN:  - Discussed the role for surgery and potential outcomes which would likely include short gut syndrome given the proximity of the nodes along the SMA and SMV. I do not think the vaginal lesion is a metastatic lesion. She expressed that she does not wish to pursue surgery at this time given that her disease is stable and she is clinically doing well.  - She will reach out to her OBGYN to evaluate the vaginal lesion that was seen on DOTATATE scan  - Will discuss her case at tumor board - continue octreotide vs other therapy given that she does not wish to pursue surgery     Electronically signed by Lenard Simmer, RN on 10/06/2023 at 11:37 AM

## 2023-10-06 NOTE — Progress Notes (Addendum)
 VIRTUAL VISIT PROGRESS NOTE    This is a virtual visit using MyChart Zoom Video Visit.   It required patient-provider interaction for the medical decision making as documented below.      I have communicated my name and active licensure. The patient's identity and physical location were verified at the time of this visit. Either the patient or their legal representative has been informed of the risks and benefits of -- and alternatives to -- treatment through a remote evaluation and consents to proceed with the evaluation remotely.      Monica Wood is a 48 year old female seen for follow up.    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 Path with well-differentiated neuroendocrine tumors (13 tumors in total, largest measuring 2.8 cm), nodes were positive and unresectable (2/3 LNs resected were positive), and Ki-67 was 1-2%.     I saw her in early February. Her DOTATAE scan shows stable disease - there is a large central mesenteric node and some other small ones along with periduodenal nodes. 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 She is still on octreotide.     Recently saw her OBGYN and had an ultrasound which was normal per the patient.    Tolerating octreotide. Doing well.     HISTORY REVIEWED (electronic chart updated):  No past medical history on file.  No past surgical history on file.  No family history on file.  Social History     Tobacco Use   . Smoking status: Never   . Smokeless tobacco: Never     Current Outpatient Medications   Medication Sig   . iv contrast (will be provided with radiology test) CT ABD/PEL -Inject, intravenously, once for 1 dose.No IV access, insert saline lock prior to the beginning of sedation, infusion, injection of imaging exam. Discontinue saline lock post exam. If Pt. has a central line or IVAD, may access for  administration according to line specific nursing protocol. Once exam is complete flush line and de-access according to line specific nursing protocol in the CT contrast administration guidelines link.   Marland Kitchen albuterol HFA (PROVENTIL HFA, VENTOLIN HFA) 90 mcg/actuation inhaler Inhale 2 Puffs as instructed.   Marland Kitchen buPROPion XL (WELLBUTRIN XL) 150 mg 24 hr tablet Take 1 tablet by mouth every morning.   . calcium-carbonate-vitamin D3 500 mg-5 mcg (200 unit) per tablet Take 1 tablet by mouth.   . diphenoxylate-atropine (LOMOTIL) 2.5-0.025 mg per tablet    . DULoxetine (CYMBALTA) 30 mg capsule Take 1 capsule by mouth once daily.   . DULoxetine (CYMBALTA) 60 mg capsule Take 1 capsule by mouth once daily.   . famotidine (PEPCID) 40 mg tablet Take 1 tablet by mouth daily at bedtime.   . ferrous sulfate 325 mg (65 mg iron) tablet Take 325 mg by mouth.   Marland Kitchen ketoconazole (NIZORAL) 2 % cream Apply 1 Applicatorful to affected area.   Marland Kitchen CREON 36,000-114,000- 180,000 unit delayed release capsule Take 36,000 Units by mouth.   . lisdexamfetamine (VYVANSE) 40 mg capsule Take 40 mg by mouth.   . meloxicam (MOBIC) 15 mg tablet Take 15 mg by mouth.   Marland Kitchen tiZANidine (ZANAFLEX) 4 mg tablet Take 4 mg by mouth.   . tramadol HCl (TRAMADOL ORAL) Take by mouth.     No current facility-administered medications for this visit.  ALLERGIES   Allergen Reactions   . Oxycodone-Acetamino* Shortness of Breath   . Parsley              Anaphylaxis, Hives, Itching, Shortness of Breath   . Codeine              Anaphylaxis     Anaphylaxis     Anaphylaxis   . Morphine             Unknown       REVIEW OF SYSTEMS:  GENERAL: feeling well without fatigue, no recent change in weight  HEENT: denies HA, change in hearing or vision, no other ENT complaints  NECK: denies swelling or pain in neck  RESPIRATORY: no cough, no wheezing or shortness of breath  CARDIOVASCULAR: no chest pain, no palpitations  GI: normal appetite, tolerating PO well, BMs normal, and no abdominal  pain  GU: urination is normal  MUSCULOSKELETAL: denies any painful or swollen joints, no muscle aches  SKIN: no rash  PSYCH: denies depressed or anxious mood, sleep is normal  HEMATOLOGY/LYMPHOLOGY: negative for prolonged bleeding, no swollen lymph nodes  ENDOCRINE: denies cold/heat intolerance, denies polyuria or polydipsia, no goiter  NEURO: no numbness or paresthesias and no weakness of the extremities    PHYSICAL EXAMINATION:    VIDEO EXAM:  (if completed, performed via video enabled technology)  No exam performed    ASSESSMENT:   Monica Wood is a 48 year old female with 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 Path with well-differentiated neuroendocrine tumors (13 tumors in total, largest measuring 2.8 cm), nodes were positive and unresectable, and Ki-67 was 1-2%.     PLAN:  - Discussed the role for surgery and potential outcomes which would likely include short gut syndrome given the proximity of the nodes along the SMA and SMV. I do not think the vaginal lesion is a metastatic lesion. She expressed that she does not wish to pursue surgery at this time given that her disease is stable and she is clinically doing well.  - She will reach out to her OBGYN to evaluate the vaginal lesion that was seen on DOTATATE scan  - Will discuss her case at tumor board - continue octreotide vs other therapy given that she does not wish to pursue surgery     There are no Patient Instructions on file for this visit.    Monica Solomons, DO    Medical Decision Making:     Problems:   Low:  Stable chronic illness  Data:   Unique test result(s) reviewed:  3+  Risk:   Moderate:  Moderate risk from testing/treatment    Medical Decision Making  Level: 4 - Moderate

## 2023-10-06 NOTE — Telephone Encounter (Signed)
 Name: Auri Jahnke  DOB: 12-Oct-1975  MRN: [id number]    Oncology Navigation Follow-Up Note    Contact Type:  Telephone    Notes: Writer spoke to Dr. Cloretta Ned regarding pts recent PET in [place] and their recommendations to see OBGYN locally concerning the findings. Order placed for Dr. Anabel Bene, OB/GYN, pt informed and appreciative. Will continue to follow.      Electronically signed by Lenard Simmer, RN on 10/06/2023 at 1:14 PM

## 2023-10-09 ENCOUNTER — Encounter: Payer: PRIVATE HEALTH INSURANCE | Primary: Nurse Practitioner

## 2023-10-12 ENCOUNTER — Ambulatory Visit
Admit: 2023-10-12 | Discharge: 2023-10-12 | Payer: PRIVATE HEALTH INSURANCE | Attending: Gastroenterology | Primary: Nurse Practitioner

## 2023-10-12 VITALS — BP 104/72 | HR 83 | Temp 97.90000°F | Resp 16 | Wt 216.0 lb

## 2023-10-12 DIAGNOSIS — D3A019 Benign carcinoid tumor of the small intestine, unspecified portion: Secondary | ICD-10-CM

## 2023-10-12 MED ORDER — FAMOTIDINE 40 MG PO TABS
40 | ORAL_TABLET | Freq: Every evening | ORAL | 3 refills | Status: DC
Start: 2023-10-12 — End: 2024-03-07
  Filled 2023-10-13: qty 30, 30d supply, fill #0

## 2023-10-12 NOTE — Progress Notes (Signed)
 GI CLINIC FOLLOW UP    INTERVAL HISTORY:   No referring provider defined for this encounter.    Chief Complaint   Patient presents with    Follow-up     3 month follow up        HISTORY OF PRESENT ILLNESS: Monica Wood is a 48 y.o. 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     Here for f/u  Patient initially presented to ER on 11/16/22 for abd pain. Ct showed partial SBO due to mesenteric mass. Mass was not previously seen on MRI abd 08/14/22. Tumor pathology significant for multiple well-differentiated NETs extending through the muscularis propria into subserosal tissue without penetration of the overlying serosa with local lymph node involvement. CEA, CA125, CA19-9, chromagranin A wnl 11/17/22.     Following with heme onc - Dr Cloretta Ned. Repeat PET CT 07/10/23 showing progression of metastatic disease in the mesenteric  LN. She was referred to [facility] general surgery team to plan for bowel resection. Plans to f/u GYN ONC regarding vaginal lesion seen on imaging, scheduled for 11/05/23. Case will be discussed at [facility] tumor board - continue octreotide vs other therapy given that she does not wish to pursue surgery .    For her diarrhea and abd pain, she is on creon. Started prebiotic (fiber) with improvement in pain level, still worst in the epigastrium. The final week before her octreotide injection is due she has worsened diarrhea requiring imodium. Continues iron supplementation.     New swelling in her legs in the morning as well as pain in her left calf. She is on estrogen patches/prometrium caps/testosterone injections.    5 lb wt loss since last visit due to dec appetite.  Denies fever/chills, dysphagia/odynophagia, n/v, abd pain, diarrhea/constipation, black or bloody stools.       Last EGD: 11/18/22 - gastritis with bx pos for focal intestinal metaplasia of stomach, negative for H pylori  Last colonoscopy:  11/18/22 - 1 tubular adenoma, hemorrhoids, 3 yr recall  Labs: Alk phos 112, normal TSH    PET scan: 07/10/23 IMPRESSION:  Findings consistent with progression of metastatic disease with increased  radiotracer avidity of the previously noted mesenteric lymph nodes and some  increase in size from previous.    CT abd pelvis @ [facility] 09/23/23   RESULT:    Liver: No mass. Normal morphology.    Biliary: No intrahepatic or extrahepatic biliary ductal dilation.  Status  post cholecystectomy.    Spleen: No mass. No splenomegaly.    Pancreas: No mass or duct dilation.    Adrenals: No mass.    Kidneys: Punctate nonobstructive left lower pole renal calculus (8:62).  No mass or hydronephrosis.  Symmetric renal enhancement.    GI tract: No dilation or wall thickening.    Lymph nodes: Stable appearance of the central small bowel mesenteric node  which measures 2.8 x 2.1 cm, unchanged since 04/17/2023 (9:220).  A few  additional stable mesenteric lymph nodes are noted, which demonstrated  Dotatate uptake.  For reference: 0.6 x 0.5 cm more superior central  mesenteric lymph node (3:68).  A couple tiny of additional periduodenal  lymph nodes appear stable measuring between 0.7 and 1.0 cm; these may  correspond to areas of Dotatate uptake (6:62 and 6:65) (3:54 and 3:56).    Mesentery/Peritoneum: No ascites or mass.    Retroperitoneum: No mass.    Vasculature:   - Abdominal aorta and  iliac arteries: No aneurysm.   - Celiac and SMA: Patent without stenosis.   - Portal venous system (SMV, splenic vein, portal vein and branches):  Patent.   - Hepatic veins: Patent.    Pelvis: No mass, ascites or fluid collection.    Bones/Soft Tissues: No suspicious lesion.    Lower thorax: Unremarkable.    Localizer images: No additional findings.  Exam End: 09/23/23 09:41       Past Medical,Family, and Social History reviewed and does contribute to the patient presentingcondition.    I did review all the labs results available for the labs which were ordered  by the primary care physician, and the other consultants, we search on epic at [facility] and all the available care everywhere epic    I did review all the imaging studies of the abdomen available on EMR, ordered by the primary care physician and the other consultant    I did review all the pathology from the biopsies done on the previous endoscopies    Patient's PMH/PSH,SH,PSYCH Hx, MEDs, ALLERGIES, and ROS were all reviewed and updated in the appropriate sections.    PAST MEDICAL HISTORY:  Past Medical History:   Diagnosis Date    Anemia     Anxiety and depression     Arthritis     Breast pain     Bursitis     Carcinoid tumor (HCC)     Chronic diarrhea     Depression 2020    Digestive problems     Female genital prolapse, unspecified     Fibromyalgia     Irritable bowel syndrome 2001    Obesity 1985    Osteoarthritis 2019    Retention of urine     Scoliosis     STD (sexually transmitted disease)     Chlamydia in college    Tendinitis     Tension headache     Urinary incontinence     Vitamin D deficiency     Vitamin D deficiency        Past Surgical History:   Procedure Laterality Date    ABDOMINOPLASTY      COLONOSCOPY N/A 11/18/2022    COLONOSCOPY POLYPECTOMY SNARE BIOPSY performed by Marcia Brash, MD at [facility] [place] [facility]    ENDOMETRIAL ABLATION  2008    ESOPHAGOGASTRIC FUNDOPLICATION  2000    GALLBLADDER SURGERY      LAPAROTOMY N/A 11/19/2022    LAPAROTOMY EXPLORATORY.  SMALL BOWEL RESECTION.  INCISIONAL BIOPSY OF MESENTERIC LYMPH NODE performed by Lucious Groves, MD at [facility] OR    OTHER SURGICAL HISTORY      ablation    TUBAL LIGATION      UPPER GASTROINTESTINAL ENDOSCOPY N/A 11/18/2022    ESOPHAGOGASTRODUODENOSCOPY BIOPSY performed by Marcia Brash, MD at [facility] [place] [facility]       CURRENT MEDICATIONS:    Current Outpatient Medications:     famotidine (PEPCID) 40 MG tablet, Take 1 tablet by mouth at bedtime, Disp: 90 tablet, Rfl: 3    lipase-protease-amylase (CREON) 36000-114000 units CPEP delayed release  capsule, 1 capsule TID with meals, Disp: 270 capsule, Rfl: 11    estradiol (VIVELLE-DOT) 0.0375 MG/24HR, Place 1 patch onto the skin Twice a Week, Disp: 8 patch, Rfl: 3    progesterone (PROMETRIUM) 100 MG CAPS capsule, Take 1 capsule by mouth nightly, Disp: 30 capsule, Rfl: 1    albuterol sulfate HFA (PROVENTIL HFA) 108 (90 Base) MCG/ACT inhaler, Inhale 2 puffs into the lungs every 6  hours as needed for Wheezing or Shortness of Breath, Disp: 18 g, Rfl: 2    buPROPion (WELLBUTRIN XL) 150 MG extended release tablet, Take 1 tablet by mouth every morning, Disp: 90 tablet, Rfl: 1    DULoxetine (CYMBALTA) 60 MG extended release capsule, Take 1 capsule by mouth daily, Disp: 90 capsule, Rfl: 1    PREBIOTIC PRODUCT PO, Take by mouth, Disp: , Rfl:     Calcium Carbonate-Vitamin D (OYSTER SHELL CALCIUM/D) 500-5 MG-MCG TABS, Take 1 tablet by mouth, Disp: , Rfl:     vitamin E 400 UNIT capsule, Take 1 capsule by mouth daily, Disp: , Rfl:     Cholecalciferol (VITAMIN D3) 1.25 MG (50000 UT) CAPS, Take by mouth, Disp: , Rfl:     ferrous sulfate (IRON 325) 325 (65 Fe) MG tablet, Take 1 tablet by mouth daily (with breakfast), Disp: 90 tablet, Rfl: 1    Lisdexamfetamine Dimesylate 40 MG CAPS, Take 40 mg by mouth daily., Disp: , Rfl:     tiZANidine (ZANAFLEX) 2 MG tablet, Take 1 tablet by mouth daily as needed (back muscle), Disp: 30 tablet, Rfl: 1    ALLERGIES:   Allergies   Allergen Reactions    Oxycodone Anaphylaxis    Parsley Leaves Anaphylaxis, Hives, Itching and Shortness Of Breath    Percocet [Oxycodone-Acetaminophen] Shortness Of Breath    Codeine      Anaphylaxis     Morphine        FAMILY HISTORY:       Problem Relation Age of Onset    Diabetes Maternal Grandmother     Diabetes Father     Diabetes Mother     Allergy (Severe) Mother     Anemia Mother     Arthritis Mother     Depression Mother     Other Mother         ADHD    Breast Cancer Neg Hx     Ovarian Cancer Neg Hx     Endometrial Cancer Neg Hx     Uterine Cancer Neg Hx      Cervical Cancer Neg Hx     Vaginal Cancer Neg Hx     Colon Cancer Neg Hx          SOCIAL HISTORY:   Social History     Socioeconomic History    Marital status: Married     Spouse name: Not on file    Number of children: Not on file    Years of education: Not on file    Highest education level: Not on file   Occupational History    Not on file   Tobacco Use    Smoking status: Never    Smokeless tobacco: Never   Vaping Use    Vaping status: Never Used   Substance and Sexual Activity    Alcohol use: Not Currently     Alcohol/week: 4.0 standard drinks of alcohol     Types: 4 Glasses of wine per week     Comment: 3/4 times a week    Drug use: Yes     Comment: uses gummies periodically for pain control    Sexual activity: Yes     Partners: Male   Other Topics Concern    Not on file   Social History Narrative    Not on file     Social Drivers of Health     Financial Resource Strain: Low Risk  (12/01/2022)    Overall Financial Resource Strain (CARDIA)  Difficulty of Paying Living Expenses: Not hard at all   Food Insecurity: No Food Insecurity (07/30/2023)    Hunger Vital Sign     Worried About Running Out of Food in the Last Year: Never true     Ran Out of Food in the Last Year: Never true   Transportation Needs: No Transportation Needs (07/30/2023)    PRAPARE - Therapist, art (Medical): No     Lack of Transportation (Non-Medical): No   Physical Activity: Insufficiently Active (02/16/2022)    Exercise Vital Sign     Days of Exercise per Week: 4 days     Minutes of Exercise per Session: 30 min   Stress: Not on file   Social Connections: Not on file   Intimate Partner Violence: Not At Risk (02/16/2022)    Humiliation, Afraid, Rape, and Kick questionnaire     Fear of Current or Ex-Partner: No     Emotionally Abused: No     Physically Abused: No     Sexually Abused: No   Housing Stability: Low Risk  (07/30/2023)    Housing Stability Vital Sign     Unable to Pay for Housing in the Last Year: No      Number of Times Moved in the Last Year: 0     Homeless in the Last Year: No       REVIEW OF SYSTEMS: A 12-point review of systemswas obtained and pertinent positives and negatives were enumerated above in the history of present illness. All other reviewed systems / symptoms were negative.    Review of Systems   Constitutional:  Negative for appetite change, fatigue and unexpected weight change.   HENT:  Negative for sore throat, trouble swallowing and voice change.    Respiratory:  Negative for cough, choking and wheezing.    Cardiovascular:  Negative for chest pain, palpitations and leg swelling.   Gastrointestinal:  Negative for abdominal distention, abdominal pain, anal bleeding, blood in stool, constipation, diarrhea, nausea, rectal pain and vomiting.   Genitourinary:  Negative for flank pain.   Allergic/Immunologic: Negative for food allergies and immunocompromised state.   Neurological:  Negative for dizziness, speech difficulty, weakness, light-headedness and headaches.   Hematological:  Does not bruise/bleed easily.   Psychiatric/Behavioral:  Negative for sleep disturbance.            LABORATORY DATA: Reviewed  Lab Results   Component Value Date    WBC 5.1 06/19/2023    HGB 11.1 (L) 06/19/2023    HCT 35.0 (L) 06/19/2023    MCV 73.4 (L) 06/19/2023    PLT 327 06/19/2023    NA DISREGARD RESULTS.  INCORRECT PATIENT NUMBER. 07/10/2023    NA 136 07/10/2023    K DISREGARD RESULTS.  INCORRECT PATIENT NUMBER. 07/10/2023    K 4.2 07/10/2023    CL DISREGARD RESULTS.  INCORRECT PATIENT NUMBER. 07/10/2023    CL 103 07/10/2023    CO2 DISREGARD RESULTS.  INCORRECT PATIENT NUMBER. 07/10/2023    CO2 24 07/10/2023    BUN DISREGARD RESULTS.  INCORRECT PATIENT NUMBER. 07/10/2023    BUN 15 07/10/2023    CREATININE DISREGARD RESULTS.  INCORRECT PATIENT NUMBER. 07/10/2023    CREATININE 0.7 07/10/2023    BILITOT DISREGARD RESULTS.  INCORRECT PATIENT NUMBER. 07/10/2023    BILITOT 0.2 (L) 07/10/2023    ALKPHOS DISREGARD RESULTS.   INCORRECT PATIENT NUMBER. 07/10/2023    ALKPHOS 112 (H) 07/10/2023    AST DISREGARD RESULTS.  INCORRECT PATIENT  NUMBER. 07/10/2023    AST 22 07/10/2023    ALT DISREGARD RESULTS.  INCORRECT PATIENT NUMBER. 07/10/2023    ALT 20 07/10/2023    INR <0.8 05/04/2023         Lab Results   Component Value Date    RBC 4.77 06/19/2023    HGB 11.1 (L) 06/19/2023    MCV 73.4 (L) 06/19/2023    MCH 23.2 (L) 06/19/2023    MCHC 31.7 06/19/2023    RDW 15.5 (H) 06/19/2023    MPV 7.1 06/19/2023    BASOPCT 1 06/19/2023    LYMPHSABS 2.20 06/19/2023    MONOSABS 0.50 06/19/2023    NEUTROABS 2.20 06/19/2023    EOSABS 0.20 06/19/2023    BASOSABS 0.10 06/19/2023         DIAGNOSTIC TESTING:     MAM TOMO DIGITAL SCREEN BILATERAL  Result Date: 09/25/2023  EXAMINATION: SCREENING DIGITAL BILATERAL MAMMOGRAM WITH TOMOSYNTHESIS, 09/25/2023 TECHNIQUE: Screening mammography was performed with tomosynthesis including MLO and CC views of the bilateral breasts. Computer aided detection was used for the interpretation of this exam. COMPARISON: 23 April 2022; 12 July 2019 HISTORY: Screening. No family history of breast cancer.  1 year history of oral contraception.  Positive HRT therapy.  No breast interventions.  TC score 5.88 FINDINGS: BREAST COMPOSITION: There are scattered areas of fibroglandular density. Bilateral breasts are composed of scattered fibroglandular density.  No skin thickening, nipple contour changes, suspicious calcifications, suspicious masses, areas of architectural distortion or significant interval changes are noted.  Stable lymph node present in the upper outer right breast.     No evidence of malignancy seen in either breast.  Advise annual screening mammography. Breast tissue can be either dense or not dense. Dense tissue makes it harder to find breast cancer on a mammogram and also raises the risk of developing breast cancer. Your breast tissue is NOT DENSE. Talk to your health care provider about breast density, risk for  breast cancer and your individual situation. BI-RADS 2 BIRADS: BIRADS - CATEGORY 2 Benign Findings.  Normal interval follow-up is recommended in 12 months. OVERALL ASSESSMENT - BENIGN A letter of notification will be sent to the patient regarding the results. The [employer] recommends annual mammograms for women 40 years and older. Performing Facility: [facility] [id number]  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 TECHNIQUE: CT of the abdomen and pelvis was performed using standard technique, scanning from just above the dome of the diaphragm to the symphysis pubis. MQ:  CTAP_3 Contrast: IV:  100 ml of Omnipaque 350 CT Radiation dose: Integrated Dose-length product (DLP) for this visit =   513 mGy*cm. CT Dose Reduction Employed: Automated exposure control(AEC) and iterative recon COMPARISON: Dotatate PET/CT dated 09/23/2023, 06/20/2023, and 12/22/2022; outside CTA chest abdomen and pelvis dated 04/17/2023; MRI abdomen dated 05/16/2023. RESULT: Liver: No mass. Normal morphology. Biliary: No intrahepatic or extrahepatic biliary ductal dilation.  Status post cholecystectomy. Spleen: No mass. No splenomegaly. Pancreas: No mass  or duct dilation. Adrenals: No mass. Kidneys: Punctate nonobstructive left lower pole renal calculus (8:62). No mass or hydronephrosis.  Symmetric renal enhancement. GI tract: No dilation or wall thickening. Lymph nodes: Stable appearance of the central small bowel mesenteric node which measures 2.8 x 2.1 cm, unchanged since 04/17/2023 (9:220).  A few additional  stable mesenteric lymph nodes are noted, which demonstrated Dotatate uptake.  For reference: 0.6 x 0.5 cm more superior central mesenteric lymph node (3:68).  A couple tiny of additional periduodenal lymph nodes appear stable measuring between 0.7 and 1.0 cm; these may correspond to areas of Dotatate uptake (6:62 and 6:65) (3:54 and 3:56). Mesentery/Peritoneum: No ascites or mass. Retroperitoneum: No mass. Vasculature:  - Abdominal aorta and iliac arteries: No aneurysm.  - Celiac and SMA: Patent without stenosis.  - Portal venous system (SMV, splenic vein, portal vein and branches): Patent.  - Hepatic veins: Patent. Pelvis: No mass, ascites or fluid collection. Bones/Soft Tissues: No suspicious lesion. Lower thorax: Unremarkable. Localizer images: No additional findings.    IMPRESSION: 1.  Stable appearance of the central small bowel mesenteric lymph nodes that are likely metastatic. 2.  Stable postoperative changes from small bowel resection. 3.  No new or enlarging metastasis in the abdomen or pelvis. I agree that this report by the resident or fellow represents my interpretation of the study. Transcriptionist: PSCB   Transcribe Date/Time: Sep 23 2023 10:09A Dictated by : Hedwig Morton, MD This examination was interpreted and the report reviewed and electronically signed by: Darlin Drop, MD on Sep 23 2023  1:07PM  EST    NM PET/CT NEUROENDOCRINE WHOLE BODY IMAGING  Result Date: 09/23/2023  * * *Final Report* * * DATE OF EXAM: Sep 23 2023  9:15AM   [facility]   0094  -  NM PET/CT NEUROENDOCRINE WB  / ACCESSION #  [id number] PROCEDURE REASON: Malignant carcinoid tumor, unspecified site (HCC)      * * * * Physician Interpretation * * * *   EXAMINATION: SOMATOSTATIN RECEPTOR PET-CT CLINICAL HISTORY: History of small bowel carcinoid disease status post resection. EXAM CATEGORY: Subsequent treatment strategy. TECHNIQUE: Radiopharmaceutical was administered IV followed by PET imaging from the skull vertex to thighs.  Free  breathing, low dose CT of the same body region was acquired without IV contrast for attenuation correction and anatomic localization.  Unenhanced imaging is limited for the evaluation of some pathology and the acquired CT was not designed to produce diagnostic CT scan quality.  Physiologic/non-pathologic uptake in some body regions could confound or obscure some pathology. *  CT Dose-Length Product (DLP): 492 mGy*cm *  CT Dose Reduction Employed: Yes *  Injection site: Right Hand *  Injected activity: 5.7 mCi *  Uptake Time: 60 minutes *  Radiopharmaceutical: Ga-68 Dotatate COMPARISON: Cu-64 Dotatate PET/CT 07/10/2023 CORRELATION: MRI 06/03/2023 RESULT: REFERENCES: Dotatate uptake serves as a surrogate marker for somatostatin receptor 2 (SSTR2) expression.  All reported standardized uptake values represent maximum SUV (SUVmax) per body weight, unless otherwise specified.  SUV Reference Values: *  Background Liver: SUVmax 7.0 *  Background Spleen: SUVmax 22.8 Localizer Images: Unremarkable. HEAD AND NECK: Head: No radiotracer avid lesion or mass effect in the intracranial compartment. Aerodigestive Tract: No radiotracer avid lesion. Lymph Nodes: No radiotracer avid lymphadenopathy. Neck Soft Tissues: No radiotracer avid thyroid nodule. CHEST: Lungs & Pleura: No radiotracer avid mass, nodule, or consolidation.  No pleural effusion. Lymph Nodes: No radiotracer avid lymphadenopathy. Mediastinum: No radiotracer avid mass. Cardiovascular: Blood pool activity. No  pericardial effusion. Chest Wall: No radiotracer avid soft tissue lesion. ABDOMEN AND PELVIS: Hepatobiliary: No radiotracer avid lesion.  No measurable mass. Spleen: No radiotracer avid lesion.  No splenomegaly. Pancreas: No radiotracer avid lesion. Adrenals: No radiotracer avid nodule. Urinary Tract: Physiologic radiotracer excretion in the renal collecting systems and urinary bladder.  No hydronephrosis. GI Tract/Lymph Nodes: Persistent SSTR2 expression in the  previously seen D4 segment adjacent lesion with SUV max of 14.6, prior SUV max of 9.6, as well as the other D3-D4 segment adjacent lesion SUV max of 13.2, prior SUV max of 11.9. Persistent central small bowel mesenteric lymph nodes/mass with SUV max of 52.0, prior SUV max of 53.1, grossly stable in size. No bowel dilation. Peritoneum: No radiotracer avid lesion. No ascites. Vasculature: Blood pool activity. Pelvic Organs: Interval development of new SSTR2 expressing lesion in the vaginal vestibule with SUV max of 15.0. MUSCULOSKELETAL: Bones: No radiotracer avid lesion.  Mildly diffuse SSTR2 expression in the vertebral body of C7/C8 and L5 likely related to degenerative disease SUV max of 6.6, 6.8 respectively. Soft Tissues: No radiotracer avid lesion.    IMPRESSION: *  Persistent multiple SSTR2 expressing mesenteric lesions/lymphadenopathy. *  Persistent SSTR2 expressing central mesenteric nodule/mass. *  Interval development of new SSTR2 expressing lesion in the vaginal vestibule, correlate with physical exam/MRI for further evaluation. Krenning Score: 4 *  Score 0: No abnormal uptake *  Score 1: Very low uptake *  Score 2: Uptake less than or equal to the liver *  Score 3: Uptake greater than liver but less than spleen *  Score 4: Uptake greater than the spleen Transcriptionist: PSCB   Transcribe Date/Time: Sep 23 2023  9:37A Dictated by : Hunt Oris, MD This examination was interpreted and the report reviewed and electronically signed by: Hunt Oris, MD on Sep 23 2023 10:15AM  EST    [place] NON OB TRANSVAGINAL  Result Date: 09/17/2023  TRANSVAGINAL ULTRASOUND UTERUS-NABOTHIAN CYSTS AND HETEROGENOUS. LARGEST FOCAL AREA-2.5 x 2.8 x 2.4 cm BILATERAL OVARIES APPEAR UNREMARKABLE Electronically signed by Grier Rocher, DO on 09/17/2023 at 8:49 AM          PHYSICAL EXAMINATION: Vital signs reviewed per the nursing documentation.     BP 104/72   Pulse 83   Temp 97.9 F (36.6 C) (Tympanic)   Resp 16   Wt 98 kg (216 lb)    LMP 09/25/2006   SpO2 95%   BMI 36.22 kg/m   Body mass index is 36.22 kg/m.   Physical Exam  Constitutional:       General: She is not in acute distress.     Appearance: She is not ill-appearing.   HENT:      Head: Normocephalic.      Mouth/Throat:      Lips: Pink.      Mouth: Mucous membranes are moist. No oral lesions.   Eyes:      General: No scleral icterus.     Pupils: Pupils are equal, round, and reactive to light.   Cardiovascular:      Rate and Rhythm: Normal rate and regular rhythm.      Pulses: No decreased pulses.      Heart sounds: No murmur heard.  Pulmonary:      Effort: Pulmonary effort is normal.      Breath sounds: Normal breath sounds.   Abdominal:      General: Bowel sounds are normal. There is no distension.      Palpations: Abdomen is soft.  There is no fluid wave, hepatomegaly, splenomegaly or mass.      Tenderness: There is no abdominal tenderness. There is no guarding. Negative signs include Murphy's sign.   Musculoskeletal:         General: No swelling.      Cervical back: Neck supple.      Right lower leg: No edema.      Left lower leg: No edema.      Comments: + homan sign on left calf; patient wants to discuss with her OBGYN on Thursday   Lymphadenopathy:      Upper Body:      Right upper body: No supraclavicular adenopathy.   Skin:     General: Skin is warm and dry.      Capillary Refill: Capillary refill takes less than 2 seconds.      Coloration: Skin is not jaundiced.      Findings: No ecchymosis or rash.   Neurological:      Mental Status: She is alert and oriented to person, place, and time.   Psychiatric:         Attention and Perception: Attention normal.         Mood and Affect: Mood is not anxious or depressed.         Speech: Speech is not rapid and pressured.         Behavior: Behavior is not agitated. Behavior is cooperative.         Cognition and Memory: Cognition is not impaired. Memory is not impaired.         IMPRESSION: Ms. Turbin is a 47 y.o. female with    Diagnosis Orders   1. Carcinoid tumor of small intestine, unspecified location, unspecified whether malignant (HCC)        2. Pancreatic insufficiency        3. IPMN (intraductal papillary mucinous neoplasm)        4. Right upper quadrant abdominal pain        5. LFT elevation        6. Diarrhea, unspecified type        7. Pancreatic divisum        8. Dyspepsia  famotidine (PEPCID) 40 MG tablet        Notify if any worsening of sx  Make sure to f/u left calf with OBGYN - recommend calling them today  Continue Creon/imodium/pre and probiotics  Continue pepcid for GERD sx - requesting refill today  Keep all f/u Shah[facility]  Keep f/u [facility] Surgery team  Notify of any changes in care  F/u 3-6 mo or sooner as needed    Medication where reviewed, side effects from the GI medication were reviewed with the patient  We did refill the GI medications    Diet/life style/natural hx /complication of the dx were all explained in details   Past medical, past surgical, social history, psychiatric history, medications or allergies, all reviewed and  updated    Thank you for allowing me to participate in the care of Ms. Engert. For any further questions please do not hesitate to contact me.    I have reviewed and agree with the ROS entered by the MA/RN.     Note is dictated utilizing voice recognition software. Unfortunately this leads to occasional typographical errors. Please contact our office if you have any questions.  This note is created with the assistance of the speech recognition program. While intending to generate a document that actually reflects the content  of the visit, it can still have some errors including those of syntax and sound-a-like substitutions which may escape proof reading. Actual meaning can be extrapolated by contextual inference.    This patient was seen alongside Lucile Shutters, PA-C    Marcia Brash, MD  [facility]  O: [phone]

## 2023-10-13 ENCOUNTER — Telehealth

## 2023-10-13 MED ORDER — ESTRADIOL 0.0375 MG/24HR TD PTTW
0.0375 | MEDICATED_PATCH | TRANSDERMAL | 3 refills | Status: DC
Start: 2023-10-13 — End: 2024-02-10

## 2023-10-13 MED ORDER — PROGESTERONE 100 MG PO CAPS
100 | ORAL_CAPSULE | Freq: Every evening | ORAL | 3 refills | Status: DC
Start: 2023-10-13 — End: 2024-02-10

## 2023-10-13 NOTE — Telephone Encounter (Signed)
 LM for pt relaying Jill's message, okay to leave messages per ROI

## 2023-10-13 NOTE — Telephone Encounter (Signed)
 Refilled estradiol patch and Prometrium. Please call patient and let her know both are refilled. (I only saw a refill request come in for the estradiol patch; she also needs to take the Prometrium daily).

## 2023-10-15 ENCOUNTER — Ambulatory Visit: Admit: 2023-10-15 | Discharge: 2023-10-15 | Payer: PRIVATE HEALTH INSURANCE | Primary: Nurse Practitioner

## 2023-10-15 VITALS — BP 98/61 | HR 60

## 2023-10-15 DIAGNOSIS — R6882 Decreased libido: Secondary | ICD-10-CM

## 2023-10-15 NOTE — Progress Notes (Signed)
 [facility], [facility]  [facility] [place]   [address] [place] [address]  Dept: [phone]  Date: 10/14/2023  Patient Name: Monica Wood    VISIT - FOLLOW UP VISIT     CC: had no chief complaint listed for this encounter.    Chaperone present for entire visit and pertinent physical exam:Resident    HPI: Here for testosterone injection.Patient has own [employer] supply  History of Present Illness  The patient presents for testosterone injection.    She reports a positive response to the testosterone injection, noting an increase in energy levels and physical strength. She is not experiencing any adverse effects from the treatment. Her urinary function remains normal, and she does not report any current issues with bloating. Overall, she expresses a sense of well-being and feels as though she is returning to her usual state of health.       Overall state of well-being, dietary and nutritional habits, exercise routines of at least 30 minutes 3 times a week, bowel and bladder function, smoking history, HRT history, sexual activity and partner/s, dyspareunia, and immunizations all revisited if necessary by chart review or direct questioning.    Topics of Prolapse, Pain, Pressure were revisited including type, period of onset, level of severity, quality and quantity, associations, trends, exacerbators, alleviators, bleeding, prolapse to reduce, splinting, and prior treatment / surgery.    Topics of Urinary Leakage were revisited including type, period of onset, level of severity, quality and quantity, associations, trends, exacerbators, alleviators, urgency, frequency, nocturia ,urge incontinence / stress incontinence triggers, hesitancy, obstruction with need to catheterize, frequent UTI"s >3 in a year diagnosed by culture, hematuria (gross or microscopic), urinary stones, and prior treatment / surgery.    Topics of Fecal  Incontinence were revisited including type, period of onset, level of severity, quality and quantity, associations, trends, exacerbators, alleviators, times per day/week, stool quality / quantity, rectal bleeding, hemorrhoids, constipation / Anismus / Proctalgia fugax, laxative abuse, and prior treatment / surgery.    Topics of General Gynecology were revisited including gravity/parity, #SVD's, perineal delivery trauma, LMP, days of flow, heaviest days, # pads / tampons per day, cramps, anemia, discharge, prior STI's.    Flowsheet:  Screenings reviewed per Flowsheet including Pap smear, mammogram, dexascan, colonoscopy. Any screenings due will be ordered for this visit if patient agrees.      ROS:  Review of Systems  Low libido  All other systems negative.    PHYSICAL EXAM:  Objective   Last menstrual period 09/25/2006.  Physical Exam     AOx3. No hair loss. Negative SOB, calf pain. Injection 25mg  given right buttock IM. Lot [id number] Exp: 9/27    VISIT RESULTS:  No results found for any visits on 10/15/23.     ASSESSMENT/PLAN:  Assessment & Plan  1. Testosterone therapy.  She reports increased energy and strength with no side effects. The next dose of testosterone injection is scheduled for 4 weeks from now.    PROCEDURE  The patient received a testosterone injection today.       Low libido  Urinary urgency  Urinary frequency  Bloating symptom  Retention, urine  Menopausal symptoms       The patient was counseled regarding review of all conditions discussed.        Multiple records reviewed. All questions were addressed to the patient's satisfaction.      Additional counseling time (minutes) spent  regarding today's visit:  15 Performing: ACVISITTIMEPERFORM: Pre-visit chart review and note construction and Covering additional health topics on top of those listed in the chief complaint      Point of care: The treating physician provided 100% of care and consultation for this encounter.    Follow up: No follow-ups on  file.     The patient (or guardian, if applicable) and other individuals in attendance with the patient were advised that Artificial Intelligence will be utilized during this visit to record, process the conversation to generate a clinical note and to support improvement of the AI technology. The patient (or guardian, if applicable) and other individuals in attendance at the appointment consented to the use of AI, including the recording.      An electronic signature was used to authenticate this note.    --Grier Rocher, DO

## 2023-10-16 ENCOUNTER — Encounter: Payer: PRIVATE HEALTH INSURANCE | Attending: Internal Medicine | Primary: Nurse Practitioner

## 2023-10-21 ENCOUNTER — Ambulatory Visit
Admit: 2023-10-21 | Discharge: 2023-10-21 | Payer: PRIVATE HEALTH INSURANCE | Attending: Internal Medicine | Primary: Nurse Practitioner

## 2023-10-21 ENCOUNTER — Inpatient Hospital Stay: Admit: 2023-10-21 | Discharge: 2023-10-21 | Payer: PRIVATE HEALTH INSURANCE | Primary: Nurse Practitioner

## 2023-10-21 VITALS — BP 96/52 | HR 79 | Temp 97.20000°F | Resp 18 | Wt 218.0 lb

## 2023-10-21 DIAGNOSIS — D3A019 Benign carcinoid tumor of the small intestine, unspecified portion: Secondary | ICD-10-CM

## 2023-10-21 MED ORDER — OCTREOTIDE ACETATE 30 MG IM KIT
30 | Freq: Once | INTRAMUSCULAR | Status: AC
Start: 2023-10-21 — End: 2023-10-21
  Administered 2023-10-21: 15:00:00 30 mg via INTRAMUSCULAR

## 2023-10-21 MED FILL — SANDOSTATIN LAR DEPOT 30 MG IM KIT: 30 MG | INTRAMUSCULAR | Qty: 1

## 2023-10-21 NOTE — Progress Notes (Signed)
 Monica Wood                                                                                                                  10/21/2023  MRN:   [id number]  Date of Birth:  1976-05-27  PCP:                           Levander Campion, APRN - CNP  Referring Physician: No ref. 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    Leg swelling is reported upon awakening in the morning, accompanied by a sensation of tightness in the calves. Additionally, daily calf pain is experienced. The legs feel full upon waking but tend to loosen up with movement in bed. Consideration is being given to using a pillow during sleep to alleviate these symptoms.    Diarrhea has improved and is now manageable. Monica Wood 3-in-1 prebiotic is currently being taken, which has been significantly beneficial.    Iron supplements are being taken. No family history of sickle cell disease or thalassemia is reported. Menstrual bleeding is not experienced.    An appointment with oncology gynecologist Dr. Freddi Wood is scheduled for 11/05/2023 due to a lesion found in the vaginal vestibule.    Voice exercises have not been performed recently. Incorporation into the routine is needed. Saying things while blowing into a straw in a glass of water has been found to be helpful.    Hormone therapy has been beneficial, and a hormone injection is due today.    FAMILY HISTORY  She reports no family history of sickle cell or  thalassemia.    During this visit patient's allergy, social, medical, surgical history and medications were reviewed and updated.    Summary of Case/History:  Monica Wood a 48 y.o.female is a patient with unresectable well-differentiated endocrine tumor of small bowel presents to the clinic to establish care for further workup and management.  Patient complains of having abdominal pain .  Presented to the ER underwent imaging which showed soft tissue mass in the ventral mesentery anterior to the aorta concerning for neoplasm.  Patient underwent EGD and colonoscopy by GI which showed a 1 cm polyp in the colon but no other significant abnormality.  Subsequently patient was taken to the OR for exploratory laparotomy small bowel resection and lymph node dissection.  Pathology from the surgery showed multiple well-differentiated neuroendocrine tumors largest  measuring 2.8 cm in the mid small bowel resection.  There was also metastasis noted to the lymph nodes.  Patient had a grade 1 disease.  Ki-67 was 1-2%.  Pathological stage was T3 N1.  Patient is recovering from the surgery.  She has a PET scan scheduled next week.  Presents to the clinic to establish care and for further workup.  Patient also noted to have microcytic anemia.  Iron studies done earlier show adequate iron stores.  Patient also complains of hoarseness of voice which has been going on for a while.    Past Medical History:   Past Medical History:   Diagnosis Date    Anemia     Anxiety and depression     Arthritis     Breast pain     Bursitis     Carcinoid tumor (HCC)     Chronic diarrhea     Depression 2020    Digestive problems     Female genital prolapse, unspecified     Fibromyalgia     Irritable bowel syndrome 2001    Obesity 1985    Osteoarthritis 2019    Retention of urine     Scoliosis     STD (sexually transmitted disease)     Chlamydia in college    Tendinitis     Tension headache     Urinary incontinence     Vitamin D deficiency     Vitamin D  deficiency      Past Surgical History:  Past Surgical History:   Procedure Laterality Date    ABDOMINOPLASTY      COLONOSCOPY N/A 11/18/2022    COLONOSCOPY POLYPECTOMY SNARE BIOPSY performed by Marcia Brash, MD at [facility] [place] [facility]    ENDOMETRIAL ABLATION  2008    ESOPHAGOGASTRIC FUNDOPLICATION  2000    GALLBLADDER SURGERY      LAPAROTOMY N/A 11/19/2022    LAPAROTOMY EXPLORATORY.  SMALL BOWEL RESECTION.  INCISIONAL BIOPSY OF MESENTERIC LYMPH NODE performed by Lucious Groves, MD at [facility] OR    OTHER SURGICAL HISTORY      ablation    TUBAL LIGATION      UPPER GASTROINTESTINAL ENDOSCOPY N/A 11/18/2022    ESOPHAGOGASTRODUODENOSCOPY BIOPSY performed by Marcia Brash, MD at [facility] [place] [facility]     Patient Family Social History:  Family History   Problem Relation Age of Onset    Diabetes Maternal Grandmother     Diabetes Father     Diabetes Mother     Allergy (Severe) Mother     Anemia Mother     Arthritis Mother     Depression Mother     Other Mother         ADHD    Breast Cancer Neg Hx     Ovarian Cancer Neg Hx     Endometrial Cancer Neg Hx     Uterine Cancer Neg Hx     Cervical Cancer Neg Hx     Vaginal Cancer Neg Hx     Colon Cancer Neg Hx      Social History     Tobacco Use    Smoking status: Never    Smokeless tobacco: Never   Vaping Use    Vaping status: Never Used   Substance Use Topics    Alcohol use: Not Currently     Alcohol/week: 4.0 standard drinks of alcohol     Types: 4 Glasses of wine per week     Comment: 3/4 times a week  Drug use: Yes     Comment: uses gummies periodically for pain control     Current Medications:  Current Outpatient Medications   Medication Sig Dispense Refill    progesterone (PROMETRIUM) 100 MG CAPS capsule Take 1 capsule by mouth nightly 30 capsule 3    estradiol (VIVELLE-DOT) 0.0375 MG/24HR Place 1 patch onto the skin Twice a Week 8 patch 3    famotidine (PEPCID) 40 MG tablet Take 1 tablet by mouth at bedtime 90 tablet 3    lipase-protease-amylase (CREON) 36000-114000 units  CPEP delayed release capsule 1 capsule TID with meals 270 capsule 11    albuterol sulfate HFA (PROVENTIL HFA) 108 (90 Base) MCG/ACT inhaler Inhale 2 puffs into the lungs every 6 hours as needed for Wheezing or Shortness of Breath 18 g 2    buPROPion (WELLBUTRIN XL) 150 MG extended release tablet Take 1 tablet by mouth every morning 90 tablet 1    DULoxetine (CYMBALTA) 60 MG extended release capsule Take 1 capsule by mouth daily 90 capsule 1    PREBIOTIC PRODUCT PO Take by mouth      Calcium Carbonate-Vitamin D (OYSTER SHELL CALCIUM/D) 500-5 MG-MCG TABS Take 1 tablet by mouth      vitamin E 400 UNIT capsule Take 1 capsule by mouth daily      Cholecalciferol (VITAMIN D3) 1.25 MG (50000 UT) CAPS Take by mouth      ferrous sulfate (IRON 325) 325 (65 Fe) MG tablet Take 1 tablet by mouth daily (with breakfast) 90 tablet 1    Lisdexamfetamine Dimesylate 40 MG CAPS Take 40 mg by mouth daily.      tiZANidine (ZANAFLEX) 2 MG tablet Take 1 tablet by mouth daily as needed (back muscle) 30 tablet 1     No current facility-administered medications for this visit.     Allergies:   Oxycodone, Parsley leaves, Percocet [oxycodone-acetaminophen], Codeine, and Morphine    Review of Systems:    Constitutional: No fever or chills. No night sweats, no weight loss   Eyes: No eye discharge, double vision, or eye pain   HEENT: negative for sore mouth, sore throat, hoarseness and voice change   Respiratory: negative for cough , sputum, dyspnea, wheezing, hemoptysis, chest pain   Cardiovascular: negative for chest pain, dyspnea, palpitations, orthopnea, PND   Gastrointestinal: negative for nausea, vomiting, constipation, abdominal pain, Dysphagia, hematemesis and hematochezia.  Positive for abdominal discomfort stable diarrhea improved  Genitourinary: negative for frequency, dysuria, nocturia, urinary incontinence, and hematuria   Integument: negative for rash, skin lesions, bruises.   Hematologic/Lymphatic: negative for easy bruising,  bleeding, lymphadenopathy, or petechiae   Endocrine: negative for heat or cold intolerance,weight changes, change in bowel habits and hair loss   Musculoskeletal: negative for myalgias, arthralgias, pain, joint swelling,and bone pain   Neurological: negative for headaches, dizziness, seizures, weakness, numbness    Physical Exam:  Vitals: BP (!) 96/52   Pulse 79   Temp 97.2 F (36.2 C) (Temporal)   Resp 18   Wt 98.9 kg (218 lb)   LMP 09/25/2006   SpO2 100%   BMI 36.56 kg/m   General appearance - well appearing, no in pain or distress  Mental status - AAO X3  Eyes - pupils equal and reactive, extraocular eye movements intact  Mouth - mucous membranes moist, pharynx normal without lesions  Neck - supple, no significant adenopathy  Lymphatics - no palpable lymphadenopathy, no hepatosplenomegaly  Chest - clear to auscultation, no wheezes, rales or rhonchi, symmetric air  entry  Heart - normal rate, regular rhythm, normal S1, S2, no murmurs  Abdomen - soft, nontender, nondistended, no masses or organomegaly.  Positive healing surgical incision site  Neurological - alert, oriented, normal speech, no focal findings or movement disorder noted  Extremities - peripheral pulses normal, no pedal edema, no clubbing or cyanosis  Skin - normal coloration and turgor, no rashes, no suspicious skin lesions noted       DATA:  Results for orders placed or performed during the hospital encounter of 09/03/23   Anti Mullerian Hormone   Result Value Ref Range    Anti-Mullerian Hormone 0.175 (H) <=0.064 ng/mL   Follicle Stimulating Hormone   Result Value Ref Range    FSH 7.1 mIU/mL   Luteinizing Hormone   Result Value Ref Range    LH 5.1 1.7 - 8.6 mIU/mL     MAM TOMO DIGITAL SCREEN BILATERAL  Result Date: 09/25/2023  EXAMINATION: SCREENING DIGITAL BILATERAL MAMMOGRAM WITH TOMOSYNTHESIS, 09/25/2023 TECHNIQUE: Screening mammography was performed with tomosynthesis including MLO and CC views of the bilateral breasts. Computer aided  detection was used for the interpretation of this exam. COMPARISON: 23 April 2022; 12 July 2019 HISTORY: Screening. No family history of breast cancer.  1 year history of oral contraception.  Positive HRT therapy.  No breast interventions.  TC score 5.88 FINDINGS: BREAST COMPOSITION: There are scattered areas of fibroglandular density. Bilateral breasts are composed of scattered fibroglandular density.  No skin thickening, nipple contour changes, suspicious calcifications, suspicious masses, areas of architectural distortion or significant interval changes are noted.  Stable lymph node present in the upper outer right breast.     No evidence of malignancy seen in either breast.  Advise annual screening mammography. Breast tissue can be either dense or not dense. Dense tissue makes it harder to find breast cancer on a mammogram and also raises the risk of developing breast cancer. Your breast tissue is NOT DENSE. Talk to your Wood care provider about breast density, risk for breast cancer and your individual situation. BI-RADS 2 BIRADS: BIRADS - CATEGORY 2 Benign Findings.  Normal interval follow-up is recommended in 12 months. OVERALL ASSESSMENT - BENIGN A letter of notification will be sent to the patient regarding the results. The [employer] recommends annual mammograms for women 40 years and older. Performing Facility: [facility] [phone]  [place], [place] [address] Phone: [phone]     CT ABD/PEL Rae Halsted  Result Date: 09/23/2023  * * *Final Report* * * DATE OF EXAM: Sep 23 2023  9:41AM   [facility]   0530  -  CT ABD/PEL Rae Halsted  / ACCESSION #  [id number] PROCEDURE REASON: Neuroendocrine tumor      * * * * Physician Interpretation * * * *  EXAMINATION:  CT ABDOMEN AND PELVIS WITH IV CONTRAST CLINICAL HISTORY: History of small bowel carcinoid tumor status post resection. TECHNIQUE: CT of the abdomen and pelvis was performed using standard technique, scanning  from just above the dome of the diaphragm to the symphysis pubis. MQ:  CTAP_3 Contrast: IV:  100 ml of Omnipaque 350 CT Radiation dose: Integrated Dose-length product (DLP) for this visit =   513 mGy*cm. CT Dose Reduction Employed: Automated exposure control(AEC) and iterative recon COMPARISON: Dotatate PET/CT dated 09/23/2023, 06/20/2023, and 12/22/2022; outside CTA chest abdomen and pelvis dated 04/17/2023; MRI abdomen dated 05/16/2023. RESULT: Liver: No mass. Normal morphology. Biliary: No intrahepatic or extrahepatic biliary ductal dilation.  Status post  cholecystectomy. Spleen: No mass. No splenomegaly. Pancreas: No mass or duct dilation. Adrenals: No mass. Kidneys: Punctate nonobstructive left lower pole renal calculus (8:62). No mass or hydronephrosis.  Symmetric renal enhancement. GI tract: No dilation or wall thickening. Lymph nodes: Stable appearance of the central small bowel mesenteric node which measures 2.8 x 2.1 cm, unchanged since 04/17/2023 (9:220).  A few additional stable mesenteric lymph nodes are noted, which demonstrated Dotatate uptake.  For reference: 0.6 x 0.5 cm more superior central mesenteric lymph node (3:68).  A couple tiny of additional periduodenal lymph nodes appear stable measuring between 0.7 and 1.0 cm; these may correspond to areas of Dotatate uptake (6:62 and 6:65) (3:54 and 3:56). Mesentery/Peritoneum: No ascites or mass. Retroperitoneum: No mass. Vasculature:  - Abdominal aorta and iliac arteries: No aneurysm.  - Celiac and SMA: Patent without stenosis.  - Portal venous system (SMV, splenic vein, portal vein and branches): Patent.  - Hepatic veins: Patent. Pelvis: No mass, ascites or fluid collection. Bones/Soft Tissues: No suspicious lesion. Lower thorax: Unremarkable. Localizer images: No additional findings.    IMPRESSION: 1.  Stable appearance of the central small bowel mesenteric lymph nodes that are likely metastatic. 2.  Stable postoperative changes from small bowel  resection. 3.  No new or enlarging metastasis in the abdomen or pelvis. I agree that this report by the resident or fellow represents my interpretation of the study. Transcriptionist: PSCB   Transcribe Date/Time: Sep 23 2023 10:09A Dictated by : Hedwig Morton, MD This examination was interpreted and the report reviewed and electronically signed by: Darlin Drop, MD on Sep 23 2023  1:07PM  EST    NM PET/CT NEUROENDOCRINE WHOLE BODY IMAGING  Result Date: 09/23/2023  * * *Final Report* * * DATE OF EXAM: Sep 23 2023  9:15AM   [facility]   [id number]  -  NM PET/CT NEUROENDOCRINE WB  / ACCESSION #  [id number] PROCEDURE REASON: Malignant carcinoid tumor, unspecified site (HCC)      * * * * Physician Interpretation * * * *   EXAMINATION: SOMATOSTATIN RECEPTOR PET-CT CLINICAL HISTORY: History of small bowel carcinoid disease status post resection. EXAM CATEGORY: Subsequent treatment strategy. TECHNIQUE: Radiopharmaceutical was administered IV followed by PET imaging from the skull vertex to thighs.  Free breathing, low dose CT of the same body region was acquired without IV contrast for attenuation correction and anatomic localization.  Unenhanced imaging is limited for the evaluation of some pathology and the acquired CT was not designed to produce diagnostic CT scan quality.  Physiologic/non-pathologic uptake in some body regions could confound or obscure some pathology. *  CT Dose-Length Product (DLP): 492 mGy*cm *  CT Dose Reduction Employed: Yes *  Injection site: Right Hand *  Injected activity: 5.7 mCi *  Uptake Time: 60 minutes *  Radiopharmaceutical: Ga-68 Dotatate COMPARISON: Cu-64 Dotatate PET/CT 07/10/2023 CORRELATION: MRI 06/03/2023 RESULT: REFERENCES: Dotatate uptake serves as a surrogate marker for somatostatin receptor 2 (SSTR2) expression.  All reported standardized uptake values represent maximum SUV (SUVmax) per body weight, unless otherwise specified.  SUV Reference Values: *  Background Liver: SUVmax 7.0 *   Background Spleen: SUVmax 22.8 Localizer Images: Unremarkable. HEAD AND NECK: Head: No radiotracer avid lesion or mass effect in the intracranial compartment. Aerodigestive Tract: No radiotracer avid lesion. Lymph Nodes: No radiotracer avid lymphadenopathy. Neck Soft Tissues: No radiotracer avid thyroid nodule. CHEST: Lungs & Pleura: No radiotracer avid mass, nodule, or consolidation.  No pleural effusion. Lymph Nodes: No radiotracer avid lymphadenopathy. Mediastinum:  No radiotracer avid mass. Cardiovascular: Blood pool activity. No pericardial effusion. Chest Wall: No radiotracer avid soft tissue lesion. ABDOMEN AND PELVIS: Hepatobiliary: No radiotracer avid lesion.  No measurable mass. Spleen: No radiotracer avid lesion.  No splenomegaly. Pancreas: No radiotracer avid lesion. Adrenals: No radiotracer avid nodule. Urinary Tract: Physiologic radiotracer excretion in the renal collecting systems and urinary bladder.  No hydronephrosis. GI Tract/Lymph Nodes: Persistent SSTR2 expression in the previously seen D4 segment adjacent lesion with SUV max of 14.6, prior SUV max of 9.6, as well as the other D3-D4 segment adjacent lesion SUV max of 13.2, prior SUV max of 11.9. Persistent central small bowel mesenteric lymph nodes/mass with SUV max of 52.0, prior SUV max of 53.1, grossly stable in size. No bowel dilation. Peritoneum: No radiotracer avid lesion. No ascites. Vasculature: Blood pool activity. Pelvic Organs: Interval development of new SSTR2 expressing lesion in the vaginal vestibule with SUV max of 15.0. MUSCULOSKELETAL: Bones: No radiotracer avid lesion.  Mildly diffuse SSTR2 expression in the vertebral body of C7/C8 and L5 likely related to degenerative disease SUV max of 6.6, 6.8 respectively. Soft Tissues: No radiotracer avid lesion.    IMPRESSION: *  Persistent multiple SSTR2 expressing mesenteric lesions/lymphadenopathy. *  Persistent SSTR2 expressing central mesenteric nodule/mass. *  Interval development of  new SSTR2 expressing lesion in the vaginal vestibule, correlate with physical exam/MRI for further evaluation. 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  Reviewed records from outside facility.  Reviewed results of CT PET.  Plan to continue treatment with octreotide.  Diarrhea improved.  Patient is currently on octreotide 30 mg every 4 weeks.  Discussed natural history of well-differentiated neuroendocrine tumor.  Referred to gynecological oncology due to lesion noted in the vaginal vestibule  Patient feeling much better with HRT  Will need surveillance for IPMN.  Will repeat MRI in 1 year, 06/02/2024  Continue oral iron supplementation  Patient on HRT through gynecology team.  HRT will not interfere treatment with patient's neuroendocrine tumor.  Okay to take from oncology standpoint  Continue exercises for the vocal cord for voice changes  NCCN guidelines were reviewed and discussed with the patient.  Hemoglobin electrophoresis unremarkable however alpha thalassemia not ruled out  The diagnosis and care plan were discussed with the patient in detail. I discussed the natural history of the disease, prognosis, risks and goals of therapy and answered  all the patients questions to the best of my ability.  Patient expressed understanding and was in agreement.        Prudence Davidson, MD    This note is created with the assistance of a speech recognition program.  While  intending to generate a document that actually reflects the content of the visit, the document can still have some errors including those of syntax and sound a like substitutions which may escape proof reading.  It such instances, actual meaning can be extrapolated by contextual diversion.

## 2023-10-21 NOTE — Progress Notes (Signed)
 Pt here for sandostatin injection.   Labs reviewed.   Injection given without incident.  Pt discharged in stable condition.   Returns 11/18/23 for injection.

## 2023-10-21 NOTE — Patient Instructions (Signed)
 Continue tx   Rv in 2 months wit labs

## 2023-11-05 ENCOUNTER — Ambulatory Visit
Admit: 2023-11-05 | Discharge: 2023-11-05 | Payer: PRIVATE HEALTH INSURANCE | Attending: Gynecologic Oncology | Primary: Nurse Practitioner

## 2023-11-05 VITALS — BP 116/61 | HR 58 | Temp 97.90000°F | Wt 217.2 lb

## 2023-11-05 DIAGNOSIS — Z124 Encounter for screening for malignant neoplasm of cervix: Secondary | ICD-10-CM

## 2023-11-05 DIAGNOSIS — C7A019 Malignant carcinoid tumor of the small intestine, unspecified portion: Secondary | ICD-10-CM

## 2023-11-05 NOTE — Progress Notes (Signed)
 [facility] Gynecologic Oncology  [address], [address], [address]  [place]  [address]    GYNECOLOGIC ONCOLOGY   NEW PATIENT NOTE    PATIENT NAME: Monica Wood  MRN: [id number]  DATE: 11/05/2023    REASON FOR VISIT:     New Patient Visit  Carcinoid tumor of the SMALL INTESTINE  Vaginal mass  Cervix dysplasia    HISTORY OF PRESENT ILLNESS:     Monica Wood is a 48yo with amenorrhea (attributed to Endometrial [place]blation [place] [id number]), who presents as a new patient referral from Dr. Alvina Axon, regarding further evaluation and management of a newly diagnosed Carcinoid tumor of the SMALL INTESTINE, based on a Laparotomy small bowel resection and lymph node dissection (11/19/22), by Dr. Loel Ring. She presents, unaccompanied, to the office today.     The patient reports to experience symptoms of vaginal pain, shooting into the upper vagina. She also reports to experience symptoms of intermittent RUQ abdominal pain. She reports to carry symptoms of chronic pain, attributed to Osteoarthritis, Fibromyalgia, Bursitis, Tendinitis. She reports to feel like she is in a "flare" now. She reports to treat these pain symptoms with 1 tablet Tizanidine  QHS, 1 tablet Ttramadol QHS, 800mg  Ibuprofen once per day, with adequate relief of her symptoms.    She reports to tolerate a regular diet, with symptoms of intermittent nausea, abdominal bloating associated with constipation, intermittent with diarrhea.  She attributes the constipation symptoms to daily iron use.  She denies to experience symptoms of anorexia or early satiety.     She denies to have any abnormal GU symptoms, with the exception of intermittent "dribbling" urine after a complete void.     She reports to have any symptoms of fatigue, treated with 2-hour daily nap.     She denies to have any symptoms of vaginal bleeding. She reports to have symptoms of vaginal discharge.    She denies to have any fevers or chills.    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 Violet Grew (08/25/23), under the supervision of Dr. Johann Muta  RUQ abdominal pain in May 2024 associated with n/v. She went to ED and was told she had a mass near her SMV/SMA. She was taken to surgery on 5/8 at which time they performed a bowel resection for multiple small bowel lesions but were unable to remove all of the nodes. Her pathology was positive for neuroendocrine tumor (ki67 1-2%). Was started on sandostatin  in July. Since her surgery she has been doing well but continues to have RUQ abdominal pain     Visit note, Dr. Hilda Lovings Shahid (10/21/23) [Medical Oncology]  Presented to the ER underwent imaging which showed soft tissue mass in the ventral mesentery anterior to the aorta concerning for neoplasm. Patient underwent EGD and colonoscopy by GI which showed a 1 cm polyp in the colon but no other significant abnormality. Subsequently patient was taken to the OR for exploratory laparotomy small bowel resection and lymph node dissection. Pathology from the surgery showed multiple well-differentiated neuroendocrine tumors largest measuring 2.8 cm in the mid small bowel resection. There was also metastasis noted to the lymph nodes. Patient had a grade 1 disease. Ki-67  was 1-2%. Pathological stage was T3 N1.   Leg swelling is reported upon awakening in the morning, accompanied by a sensation of tightness in the calves. Additionally, daily calf pain is experienced. The legs feel full upon waking but tend to loosen up with movement in bed. Consideration is being given to using a pillow during sleep to alleviate these symptoms.  Diarrhea has improved and is now manageable.  Monica Wood Complete Gut Health 3-in-1 prebiotic is currently being taken, which has been significantly beneficial.  An appointment with oncology gynecologist Dr. Clementina Cutter is scheduled for 11/05/2023 due to a lesion found in the vaginal vestibule.     DIAGNOSTIC STUDIES:  IMAGING EVALUATION:  C  !! LOOK AT [facility] PRIOR TO THIS !!     PET/CT-scan   Jul 10 2023 12:00AM    OUT   0002  -  CT OUTSIDE CD DICOM Monica Wood  / ACCESSION #  [id number]   PROCEDURE REASON:  Tumor Board Discussion    * * * * Physician Interpretation * * * *    EXAMINATION: OUTSIDE IMAGING INTERPRETATION   Indication for the Request / Reason for Overread: Tumor Board Discussion   Service Requesting Consult: Surgery   Any specific Issue(s) to be discussed: Tumor Board discussion   Images Reviewed: Somatostatin Receptor PET-CT performed on 07/10/2023   Overread Date: 08/25/2023 1:06 PM   EXAMINATION: SOMATOSTATIN RECEPTOR PET-CT   CLINICAL HISTORY: Small bowel carcinoid status post resection with   mesenteric metastases on hormonal therapy.  Follow-up exam.   EXAM CATEGORY: Secondary interpretation requested.   TECHNIQUE: PET and CT imaging was acquired per the protocols and   parameters of the outside institution.  The provided imaging may or may   not represent the native source data set and therefore could contain   changes that lower the accuracy of a second-opinion interpretation.    Unenhanced imaging is limited for the evaluation of some pathology.    Physiologic/non-pathologic uptake in some body regions could confound or   obscure some pathology.   *  Scan Range: Skull vertex to thighs   *  Injected activity: 4.0 mCi   *  Uptake Time: 51 minutes   *  Radiopharmaceutical: Cu-64 Dotatate   COMPARISON: Cu-64 Dotatate PET/CT   CORRELATION: MRI 05/16/2023   RESULT:   REFERENCES:   Exam Quality: Fair (images marred by the degree of background noise)   Dotatate uptake serves as a surrogate marker for somatostatin receptor 2   (SSTR2)  expression.  All reported standardized uptake values represent   maximum SUV (SUVmax) per body weight, unless otherwise specified.  SUV   Reference Values:   *  Background Liver: SUVmax 6.7   *  Background Spleen: SUVmax 14.3   Localizer Images: Not provided.   HEAD AND NECK:   Head: No radiotracer avid lesion or mass effect in the intracranial   compartment.   Aerodigestive Tract: No radiotracer avid lesion.   Lymph Nodes: No radiotracer avid lymphadenopathy.   Neck Soft Tissues: No radiotracer avid thyroid nodule.   CHEST:   Lungs & Pleura: No radiotracer avid mass, nodule, or consolidation.  No   pleural effusion.  Note, PET is often not sensitive for lung nodules   smaller than 8 mm.   Lymph Nodes: No radiotracer avid lymphadenopathy.   Mediastinum: No radiotracer avid mass.  Cardiovascular: Blood pool activity.  No pericardial effusion.  Normal   heart size.  Chest Wall: No radiotracer avid soft tissue  lesion.   ABDOMEN AND PELVIS:   Hepatobiliary: Heterogeneous background activity.  No focal radiotracer   avid lesion or measurable mass.  Cholecystectomy.  Spleen: No radiotracer avid lesion.  No splenomegaly.   Pancreas: No radiotracer avid lesion.   Adrenals: No radiotracer avid nodule.   Urinary Tract: Physiologic radiotracer excretion in the renal collecting   systems and urinary bladder.  No hydronephrosis.   GI Tract: Right lower quadrant surgical staple line without a focal   radiotracer avid lesion.  Two similar focal areas of increased   radiotracer uptake along the distal duodenum without a discrete anatomic   correlate and may reflect mesenteric nodes (favored) versus intrinsic   lesions.  For reference:   *  Subcentimeter area along the D4 segment (SUVmax 9.6, previously 12.3;   CT image 198)   *  Subcentimeter area along the D3/D4 segment (SUVmax 11.9, previously   13.4; CT image 205).   Peritoneum: No radiotracer avid lesion.  No ascites.   Lymph Nodes: Few similar radiotracer avid lymph nodes in  the central   small bowel mesenteric root.  For example:   *  0.6 cm node (SUVmax 14.3; CT image 213), previously 0.6 cm (previous   SUVmax 12.3)   *  2.6 x 2.1 cm central small bowel mesenteric node (SUVmax 53.1; CT   image 222); previously 2.9 x 1.8 cm (previously 48.7; CT image 222)   Vasculature: Blood pool activity.  Vascular calcifications without an   abdominal aortic aneurysm.   Pelvic Organs: No radiotracer avid lesion.   MUSCULOSKELETAL:   Bones: No radiotracer avid or destructive osseous lesion.  Degenerative   changes.   Soft Tissues: No radiotracer avid lesion.  Surgical changes in the   ventral abdominal wall.   IMPRESSION:   PRIMARY DISEASE SITE AND NODAL DISEASE:   *  No SSTR2 expressing recurrent mass about the small bowel anastomosis.   *  Stable SSTR2 expressing small bowel mesenteric nodal metastases.   *  Stable areas of focal SSTR2 expression along the distal duodenum that   could reflect mesenteric nodal metastases (favored) or intrinsic lesions.   METASTATIC DISEASE:   *  No SSTR2 expressing distant metastases.   Krenning Score: 3-4   *  Score 0: No abnormal uptake   *  Score 1: Very low uptake   *  Score 2: Uptake less than or equal to the liver   *  Score 3: Uptake greater than liver but less than spleen   *  Score 4: Uptake greater than the spleen       Office pelvic ultrasound (09/17/23)  TRANSVAGINAL ULTRASOUND  UTERUS-NABOTHIAN CYSTS AND HETEROGENOUS. LARGEST FOCAL AREA-2.5 x 2.8 x 2.4 cm  BILATERAL OVARIES APPEAR UNREMARKABLE    09/23/23  PET/CT NEUROENDOCRINE WB  / ACCESSION #  [id number]   PROCEDURE REASON: Malignant carcinoid tumor, unspecified site (HCC)   * * * * Physician Interpretation * * * *   EXAMINATION: SOMATOSTATIN RECEPTOR PET-CT   CLINICAL HISTORY: History of small bowel carcinoid disease status post   resection.   EXAM CATEGORY: Subsequent treatment strategy.   TECHNIQUE: Radiopharmaceutical was administered IV followed by PET   imaging from the skull vertex to thighs.   Free breathing, low dose CT of   the same body region was acquired without IV contrast for attenuation   correction and anatomic localization.  Unenhanced imaging is limited for   the evaluation of some pathology and the acquired CT was  not designed to   produce diagnostic CT scan quality.  Physiologic/non-pathologic uptake in   some body regions could confound or obscure some pathology.   *  CT Dose-Length Product (DLP): 492 mGy*cm   *  CT Dose Reduction Employed: Yes   *  Injection site: Right Hand   *  Injected activity: 5.7 mCi   *  Uptake Time: 60 minutes   *  Radiopharmaceutical: Ga-68 Dotatate   COMPARISON: Cu-64 Dotatate PET/CT 07/10/2023   CORRELATION: MRI 06/03/2023   RESULT:   REFERENCES:   Dotatate uptake serves as a surrogate marker for somatostatin receptor 2   (SSTR2) expression.  All reported standardized uptake values represent   maximum SUV (SUVmax) per body weight, unless otherwise specified.  SUV   Reference Values:   *  Background Liver: SUVmax 7.0   *  Background Spleen: SUVmax 22.8   Localizer Images: Unremarkable.   HEAD AND NECK:   Head: No radiotracer avid lesion or mass effect in the intracranial   compartment.  Aerodigestive Tract: No radiotracer avid lesion.   Lymph Nodes: No radiotracer avid lymphadenopathy.   Neck Soft Tissues: No radiotracer avid thyroid nodule.   CHEST:   Lungs & Pleura: No radiotracer avid mass, nodule, or consolidation.  No   pleural effusion.   Lymph Nodes: No radiotracer avid lymphadenopathy.   Mediastinum: No radiotracer avid mass.   Cardiovascular: Blood pool activity. No pericardial effusion.   Chest Wall: No radiotracer avid soft tissue lesion.   ABDOMEN AND PELVIS:   Hepatobiliary: No radiotracer avid lesion.  No measurable mass.   Spleen: No radiotracer avid lesion.  No splenomegaly.   Pancreas: No radiotracer avid lesion.   Adrenals: No radiotracer avid nodule.   Urinary Tract: Physiologic radiotracer excretion in the renal collecting   systems and urinary  bladder.  No hydronephrosis.   GI Tract/Lymph Nodes: Persistent SSTR2 expression in the previously seen   D4 segment adjacent lesion with SUV max of 14.6, prior SUV max of 9.6, as   well as the other D3-D4 segment adjacent lesion SUV max of 13.2, prior   SUV max of 11.9. Persistent central small bowel mesenteric lymph   nodes/mass with SUV max of 52.0, prior SUV max of 53.1, grossly stable in   size. No bowel dilation.   Peritoneum: No radiotracer avid lesion. No ascites.   Vasculature: Blood pool activity.   Pelvic Organs: Interval development of new SSTR2 expressing lesion in the   vaginal vestibule with SUV max of 15.0.   MUSCULOSKELETAL:   Bones: No radiotracer avid lesion.  Mildly diffuse SSTR2 expression in   the vertebral body of C7/C8 and L5 likely related to degenerative disease   SUV max of 6.6, 6.8 respectively.   Soft Tissues: No radiotracer avid lesion.     CT ABDOMEN AND PELVIS WITH IV CONTRAST (09/23/23)  CLINICAL HISTORY: History of small bowel carcinoid tumor status post   resection.   TECHNIQUE: CT of the abdomen and pelvis was performed using standard   technique, scanning from just above the dome of the diaphragm to the   symphysis pubis.   MQ:  CTAP_3   Contrast:   IV: 100 ml of Omnipaque 350   CT Radiation dose: Integrated Dose-length product (DLP) for this visit =    513 mGy*cm.   CT Dose Reduction Employed: Automated exposure control(AEC) and iterative   recon   COMPARISON: Dotatate PET/CT dated 09/23/2023, 06/20/2023, and 12/22/2022;   outside CTA chest abdomen and  pelvis dated 04/17/2023; MRI abdomen dated   05/16/2023.   RESULT:   Liver: No mass. Normal morphology.   Biliary: No intrahepatic or extrahepatic biliary ductal dilation.  Status   post cholecystectomy.   Spleen: No mass. No splenomegaly.   Pancreas: No mass or duct dilation.   Adrenals: No mass.   Kidneys: Punctate nonobstructive left lower pole renal calculus (8:62).   No mass or hydronephrosis.  Symmetric renal enhancement.    GI tract: No dilation or wall thickening.   Lymph nodes: Stable appearance of the central small bowel mesenteric node   which measures 2.8 x 2.1 cm, unchanged since 04/17/2023 (9:220).  A few   additional stable mesenteric lymph nodes are noted, which demonstrated   Dotatate uptake.  For reference: 0.6 x 0.5 cm more superior central   mesenteric lymph node (3:68).  A couple tiny of additional periduodenal   lymph nodes appear stable measuring between 0.7 and 1.0 cm; these may   correspond to areas of Dotatate uptake (6:62 and 6:65) (3:54 and 3:56).   Mesentery/Peritoneum: No ascites or mass.   Retroperitoneum: No mass.   Vasculature:    - Abdominal aorta and iliac arteries: No aneurysm.    - Celiac and SMA: Patent without stenosis.    - Portal venous system (SMV, splenic vein, portal vein and branches):   Patent.    - Hepatic veins: Patent.   Pelvis: No mass, ascites or fluid collection.   Bones/Soft Tissues: No suspicious lesion.   Lower thorax: Unremarkable.     LAB EVALUATION:  Chromagranin (11/17/22): 104 (normal) => 77 (normal) (11/26/22) => 123 (normal) (12/11/22)  CA125 (11/17/22): 14 (normal)  CA19-9 (11/17/22): 7 (normal)  CEA (11/17/22): 1 (normal)     DIAGNOSIS ESTABLISHED:   Laparotomy small bowel resection and lymph node dissection (11/19/22), by Dr. Loel Ring  Intra-operative findings are consistent with a _  11/19/22  Final Diagnosis  A.  MID SMALL BOWEL, RESECTION:  -MULTIPLE (NUMBER OF TUMORS: 13) WELL-DIFFERENTIATED NEUROENDOCRINE  TUMORS, WELL-DIFFERENTIATED, LARGEST SIZE 2.8 CM, EXTENDING THROUGH  MUSCULARIS PROPRIA INTO SUBSEROSAL TISSUE WITHOUT PENETRATION OF  OVERLYING SEROSA, MARGINS NEGATIVE.  -ONE (1) LYMPH NODE NEGATIVE FOR NEOPLASM.  B.  MESENTERIC LYMPH NODE, INCISIONAL BIOPSY:  -ONE (1) OF TWO (2) LYMPH NODES INVOLVED BY NEUROENDOCRINE TUMOR.  C.  MESENTERIC LYMPH NODE, INCISIONAL BIOPSY:  -FRAGMENTS OF LYMPH NODE INVOLVED BY NEUROENDOCRINE TUMOR.  SEE  COMMENT.  Diagnosis  Comment  C.  It cannot be determined if the fragments of lymph node are  multiple individual lymph nodes or are one lymph node in multiple  pieces.     ADJUVANT TREATMENT:   Managed by Dr. Alvina Axon  INJ#1: 07/17/23  INJ#2: 08/14/23  INJ#3: 09/23/23  Octreotide  INJ (START DATE: 07/_/24. LAST INJ: 10/21/23)  Next injection on 11/18/23  Creon     Upper GI Tumor Board (10/13/23) [[facility]]  Small bowel neuroendocrine tumor s/p ex lap, SBR, 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 Path with well-differentiated neuroendocrine tumors (13 tumors in total, largest measuring 2.8 cm), nodes were positive and unresectable, and Ki-67 was 1-2%. Her DOTATAE scan shows stable disease - there is a large central mesenteric node and some other small ones along with periduodenal nodes. 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 She is still on octreotide .   Question: continue octreotide  vs other therapy given that she does not wish to pursue surgery   Review of Images:  Residual nodal disease in mesentery. No evidence of frank progression.   Board Recommendation is for:   Continue SSRA  Attendees: Representatives were present from Medical Oncology, [facility] Surgery, Radiation Oncology Radiology, and Pathology.  This is the summary of the general discussion provided at tumor board conference.  The final recommendations will be made by the primary health care team and the patient after discussing the benefits, risks and alternatives to the various treatment options.    Visit note, Dr. Hilda Lovings Shahid (09/11/23 to 10/21/23) [Medical Oncology]  Visit on 09/11/23  Seen by surgery at [facility] clinic. Scans scheduled for next week.  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  Patient at this point has decided not to pursue surgery and will continue the octreotide  shots  She will also be started on  probiotics  Continue octreotide  at 30 mg dose every 4 weeks.  Will continue monitor for symptoms.    2. Microcytic Anemia (Diagnosed _)  DIAGNOSTIC STUDIES:  LAB EVALUATION:  CBC (11/28/22): Hemoglobin 9.9 (L) and Hematocrit 31.2 (L) => Hemoglobin 11.1 and Hematocrit 35 (L) (06/19/23)  Iron (05/04/23): Iron Sat 13 (L)  Ferritin (11/27/22) 185 (H, upper limit normal 150)    Visit note, Dr. Hilda Lovings Shahid (09/11/23 to 10/21/23) [Medical Oncology]  Visit on 09/11/23  Continue oral iron supplementation    3. Pancreatic insufficiency (Diagnosed _)  MEDICAL TREATMENT: Lipase-protease-amylase (CREON ) 36000-114000 units CPEP delayed release: 1 capsule TID with meals    4. IPMN (Diagnosed _)  Visit note, Dr. Hilda Lovings Shahid (09/11/23 to 10/21/23) [Medical Oncology]  Visit on 09/11/23  Will need surveillance for IPMN.  Will repeat MRI in 1 year, 06/02/2024    5. Elevated LFTs (Diagnosed 2024)  DIAGNOSTIC STUDIES:  LAB EVALUATION:  CMP (11/26/22): AlkPhos 130 (H), AST 142 (H) and ALT 164 (H) => AlkPhos 117 (H, upper limit of normal 104) => Normal (06/19/23)    6. Cervix Dysplasia (Diagnosed 2001)   DIAGNOSTIC STUDIES:  PATHOLOGY EVALUATION:  Pap test (06/14/19): Negative for intraepithelial lesion or malignancy. Endocervix component absent. HPV negative  Pap test (05/28/22): Negative for intraepithelial lesion or malignancy. Endocervix component absent. HPV ordered as a reflex test  Pap test collected today    HEALTH MAINTENANCE:     UPDATED 11/05/23  See TREATMENT SUMMARY for details of abnormal pap tests  Mammogram (09/25/23): BR2. Records have been reviewed. Next due March 2026  Colorectal Cancer Screening, by Colonoscopy (11/18/22), by Dr. Rico Charters Daboul: Tubular adenoma. Records have been reviewed. Next due 2027  Vaccines: Tdap (Not UTD). COVID-19 (Not UTD). Despite a discussion held regarding the personal and community benefits, with minimal risks of the COVID-19 vaccine, the patient declines to accept vaccination.  The  following laboratory results were reviewed:  CBC (11/28/22): Hemoglobin 9.9 (L) and Hematocrit 31.2 (L) => Hemoglobin 11.1 and Hematocrit 35 (L) (06/19/23)  CMP (07/10/23): AlkPhos 112(H). Normal Creatinine 0.7  TSH (07/10/23): Normal   Hepatitis C Ab (05/04/23): Non-reactive  Hepatitis B (05/04/23): Non-reactive  Fasting glucose (11/06/20): Normal  Lipid panel (11/06/20): Normal  Vitamin D (11/06/20): Normal  HgBA1C (04/18/20): Normal    PAST MEDICAL HISTORY:      GERD: Pepcid   Anxiety / Depression: Cymbalta  and Wellbutrin . Counseling with psychiatrist and therapist. Symptoms are reportedly well controlled.  ADHD: Vivanz. 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 Treated with Albuterol  PRN. She denies to have required hospitalization or oxygen.  Class II obesity (BMI 36)    Decreased energy and strength: Treated with Testosterone injection q6weeks with Dr. Mackie Sayre, Estradiol  patch and Prometrium   Treatments have reportedly helped with brain fog and strength. Fatigue symptoms have not reportedly improved   LAB EVALUATION  FSH (12/26/20): 11.6 => 7.1 (09/03/23) (pre-menopause status)  AMH (09/03/23): 0.175 (H)  Estradiol  (07/10/23): 26.4 (post-menopause status)  Testosterone (07/10/23): <3 (L)  Visit note, Dr. Debria Fang Croak (10/15/23) [Urogynecology]  Decreased energy and strength: Treated with Testosterone injection  Telephone note, Geronimo Krabbe, NP (09/10/23) [OB/GYN]  Sent both Estradiol  patch and oral Prometrium  to [facility]. Testosterone rx sent to [facility]. She will need to pick up vial and bring to us  for  injection. Please schedule first IM testosterone injection.       Hoarse voice: Speech therapy. She reports to purposely make voice high in pitch to "sound normal"  Visit note, Dr. Hilda Lovings Shahid (10/21/23) [Medical Oncology]  Voice exercises have not been performed recently. Incorporation into the routine is needed. Saying things while blowing into a straw in a glass of water has been found to be helpful.       Self diagnosed "poor circulation on RIGHT side body", symptomatic with RIGHT sided headache, numbness and cold right extremity and discordance in BP between arms: Evaluated with negative venous doppler upper and lower extremity. She endorses to have not made an appointment with a vascular surgery, despite having a referral as she did not perceive "anything to have been done yet". Given that patient's symptoms and discordance in BP between arms, she has been encouraged to make the vascular surgery appointment.    PAST SURGICAL HISTORY:     LS Esophagogastric fundoplication and cholecystectomy (2000)  LEEP (2001)  LS BTL and Endometrial ablation (2008)  Abdominoplasty (2022)  EGD (11/18/22), by Dr. Rico Charters Daboul: Gastritis, with focal intestinal metaplasia of stomach. Records have been reviewed.   Laparotomy Small bowel resection, biopsy of mesenteric lymph node (11/19/22), by Dr. Loel Ring    PAST OBSTETRIC HISTORY:       G4, P2-0-2-2    1997 and 2006: SVD   1990s: 1st trimester VTP x2. Treated with a Suction D&C    PAST GYNECOLOGIC HISTORY:     Menarche age 12. Amenorrhea, attributed to Endometrial Ablation (2008). She reports to have a 2-3 year history of OCP use, 3-6 month history of Depo-Provera use, 3 month history of Nuvaring use, 5 year history of IUD and 2 month current Estrogen, Prometrium  and Testosterone use. She endorses being sexually active with 1 female partner in the past 73m  (monogamous with husband). She denies to have any symptoms of dyspareunia or post-coital bleeding. She denies to have any  history of sexually transmitted infections, with the exception of remote treatment for Chlamydia.    PAST SOCIAL HISTORY:     The patient has completed master's degree. She reports to have lost her job on 10/23/23 (she has applied for disability). She lives with her husband Aron Lard). She reports to be independent and fully functional in her activities of daily living. She reports to be limited with stairs climbing due to being deconditioned and due  to bilateral knee pain. The patient endorses a history of occasional alcohol use and reports to have consumed 4 servings of wine per week. 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 She endorses regular consumption of 0.5 gummy CBD to help treat pain and insomnia. She denies to have any history of tobacco or drug abuse.    PAST FAMILY HISTORY:     The patient denies to have any family history of melanoma, brain, breast, bladder, uterine, colon, ovarian, prostate, pancreas or stomach cancer    MEDICATIONS: MEDICATIONS HAVE BEEN RECONCILED     UPDATED 11/05/23  CURRENT OUTPATIENT MEDICATIONS:   Progesterone  (PROMETRIUM ) 100 MG: Take 1 capsule PO nightly  Estradiol  (VIVELLE -DOT) 0.0375 MG/24HR, Place 1 patch twice per week  Testosterone INJ every 6 weeks  Albuterol  sulfate HFA (PROVENTIL  HFA) 108 (90 Base) MCG/ACT: Inhale 2 puffs q6h PRN  Lisdexamfetamine Dimesylate 40 MG: Take 40 mg PO daily   TiZANidine  (ZANAFLEX ) 2 MG: Take 1 tablet PO daily PRN (back muscle)  Famotidine  (PEPCID ) 40 MG tablet, Take 1 tablet by mouth at bedtime, Disp: 90 tablet, Rfl: 3  Lipase-protease-amylase (CREON ) 36000-114000 units CPEP delayed release: 1 capsule TID with meals  BuPROPion  (WELLBUTRIN  XL) 150 MG XR: Take 1 tablet PO QAM  DULoxetine  (CYMBALTA ) 60 MG XR: Take 1 capsule PO daily  PREBIOTIC PRODUCT: Take PO daily  Calcium Carbonate-Vitamin D (OYSTER SHELL CALCIUM/D) 500-5 MG-MCG: Take 1 tablet PO daily  Vitamin E 400 UNIT: Take 1 capsule  PO daily   Cholecalciferol (VITAMIN D3): Take PO daily   Ferrous sulfate  (IRON 325) 325 (65 Fe) MG: Take 1 tablet PO daily (with breakfast)    ALLERGIES: ALLERGIES HAVE BEEN RECONCILED     CODEINE and OXYCODONE and MORPHINE  causes anaphylaxis    REVIEW OF SYSTEMS:     A complete 12 point review of systems was performed and is negative except as noted    UPDATED 11/05/23  GU: She describes to have symptoms of bumpy thicker skin to left labia     I have personally reviewed and agree with the review of systems performed by the ancillary staff in the EPIC documentation.    PHYSICAL EXAM:     UPDATED 11/05/23  Celene Coins, LPN was present as a chaperone for this exam    VITALS: BP 116/61  Pulse 58  Temp 97.22F  Wt 217lb 3.2oz  SpO2 100%  BMI 36.42kg/m     GENERAL: Well hydrated and well nourished. She is alert, cooperative and in no acute distress.   SKIN:  Skin texture, turgor and pigmentation appear normal. There are no rashes, cyanosis or petechiae noted.   HEENT:  Atraumatic, normocephalic. No conjunctival injection, ptosis or scleral icterus noted. Thyroid is not enlarged   CV:  Regular rate and rhythm without murmurs, rubs, clicks or gallops   LUNGS:  Clear to auscultation without wheezes, rales or rhonchi   ABDOMEN:  Soft, non-tender, non-distended. No masses, organomegaly, rebound tenderness or guarding noted. Active bowel sounds. There are no pulsations, bruits or flank pain noted.  EXT:  No clubbing, cyanosis, edema or varicosities noted. Pulses are equal and adequate   BREASTS:  Normal symmetry. No discrete masses, nipple discharge, dimpling, introversion or tenderness noted   PELVIC:  Vulvar exam demonstrates no gross lesions. Speculum exam demonstrates no gross vaginal or cervix lesions. No discharge, inflammation or evidence of infection noted. On bimanual exam, mobile, normal sized and shaped uterus palpated. There  is no tenderness with uterine or cervix manipulation. There is no adnexal tenderness  or obvious masses palpated. A pap test, with HPV screen has been collected today.  NEUROLOGIC:  The patient is functionally intact   LYMPH NODES:  There is no cervical or supraclavicular, axillary and inguinal lymphadenopathy noted     MEDICAL RECORDS REVIEW:     All pertinent attached records and previous notes were reviewed  ... notes (not all), imaging (not all), media, care everywhere ...  Start notes 06/26/23 and review reverse chronology        The following visit notes have been reviewed:   Visit note, Dr. Hilda Lovings Shahid (07/17/23 to 10/21/23) [Medical Oncology]  Visit on 09/11/23  Seen by surgery at [facility] clinic. Scans scheduled for next week.  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  Patient at this point has decided not to pursue surgery and will continue the octreotide  shots  She will also be started on probiotics  Continue octreotide  at 30 mg dose every 4 weeks.  Will continue monitor for symptoms.  Will need surveillance for IPMN.  Will repeat MRI in 1 year, 06/02/2024  Continue oral iron supplementation  Patient is planning to start HRT through her gynecology team.  HRT will not interfere treatment with patient's neuroendocrine tumor.  Okay to take from oncology standpoint  Hemoglobin electrophoresis unremarkable however alpha thalassemia not ruled out    Visit note, Dr. Debria Fang Croak (09/17/23 to 10/15/23) [Urogynecology]  Visit on 09/17/23  Decreased energy and strength: Treated with Testosterone injection  Visit on 09/17/23  25mg  Depo testosterone IM right buttock     Telephone note, Geronimo Krabbe, NP (10/13/23) [OB/GYN]  Refilled estradiol  patch and Prometrium . Please call patient and let her know both are refilled. (I only saw a refill request come in for the estradiol  patch; she also needs to take the Prometrium  daily).     Visit note, Dr. 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 Ct showed partial SBO due to mesenteric mass. Mass was not previously seen on MRI abd 08/14/22. Tumor pathology significant for multiple well-differentiated NETs extending through the muscularis propria into subserosal tissue without penetration of the overlying serosa with local lymph node involvement. CEA, CA125, CA19-9, chromagranin A wnl 11/17/22   Following with heme onc - Dr Modesto Andreas. Repeat PET CT 07/10/23 showing progression of metastatic disease in the mesenteric  LN. She was referred to [facility] general surgery team to plan for bowel resection. Plans to f/u GYN ONC regarding vaginal lesion seen on imaging, scheduled for 11/05/23. Case will be discussed at [facility] tumor board - continue octreotide  vs other therapy given that she does not wish to pursue surgery  For her diarrhea and abd pain, she is on creon . Started prebiotic (fiber) with improvement in pain level, still worst in the epigastrium. The final week before her octreotide  injection is due she has worsened diarrhea requiring imodium . Continues iron supplementation.   5 lb wt loss since last visit due to dec appetite.   Last EGD: 11/18/22 - gastritis with bx pos for focal intestinal metaplasia of stomach, negative for H pylori  Last colonoscopy: 11/18/22 - 1 tubular adenoma, hemorrhoids, 3 yr recall  New swelling in her legs in the morning as well  as pain in her left calf. She is on estrogen patches/prometrium  caps/testosterone injections.    Telephone note, Samuel Crock, RN (10/06/23) [facility]]    Visit note, Dr. 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 Path with well-differentiated neuroendocrine tumors (13 tumors in total, largest  measuring 2.8 cm), nodes were positive and unresectable (2/3 LNs resected were positive), and Ki-67 was 1-2%.   I saw her in early February. Her DOTATAE scan shows stable disease - there is a large central mesenteric node and some other small ones along with periduodenal nodes. 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 She is still on octreotide .   Recently saw her OBGYN and had an ultrasound which was normal per the patient.  Tolerating octreotide . Doing well  Discussed the role for surgery and potential outcomes which would likely include short gut syndrome given the proximity of the nodes along the SMA and SMV. I do not think the vaginal lesion is a metastatic lesion. She expressed that she does not wish to pursue surgery at this time given that her disease is stable and she is clinically doing well.  - She will reach out to her OBGYN to evaluate the vaginal lesion that was seen on DOTATATE scan  - Will discuss her case at tumor board - continue octreotide  vs other therapy given that she does not wish to pursue surgery    Visit note, Geronimo Krabbe, NP (09/02/23) [OB/GYN]  She has been considering HRT therapy, amenorrhea for 15+ years (ablation) She has discussed this interest with HRT with her oncologist and she states that her oncologist believes that it is OK for her to take HRT. She has been experiencing some brain fog (but has been treating her ADHD - which has improved this). Also reporting fatigue, joint pain and pruritic ears. Joint pain is improved since surgery for removal of part of small intestine; she felt that this was causing a lot of inflammation in her body and removal of part of her small intestine helped this. She does report low libido, which is distressing to her. She does have a partner that she would like to be sexually active with; just lacking libido. She denies any pelvic pain or vaginal bleeding, no itching, burning or abn discharge. She denies pain with intercourse  but does have some dryness at the start of intercourse. Her routine PCP is Maybell Spates. She does not feel that her pap is up to date. She does have a history of abnormal pap with LEEP. Patient feels that her abnormal was more than 20 years ago.  She is having hot flashes and night sweats but these are improved in recent.   She does have some urinary incontinence, dribbling urine after emptying bladder. Patient states that she wears poise pads. She has bothersome urinary urgency/ frequency. She will also leak urine during climax. She does feel a vaginal bulge. She does leak urine with laugh, cough, sneeze jump. Reporting bothersome urinary urge incontinence. She feels that she has a weak urinary stream but does not feel that she retains urine. She denies hematuria, nor frequent UTI. No prior bladder surgeries or procedures. No bowel concerns, although she does need to lean forward to empty her bowels. She does have ongoing bloating.     Visit note, Maybell Spates, NP (07/30/23) [facility]]     The following laboratory results were reviewed:  CBC (11/28/22): Hemoglobin 9.9 (L) and Hematocrit 31.2 (L) => Hemoglobin 11.1 and Hematocrit 35 (L) (06/19/23)  CMP (07/10/23): AlkPhos 112(H). Normal Creatinine 0.7  TSH (07/10/23): Normal   Hepatitis C Ab (05/04/23): Non-reactive  Hepatitis B (05/04/23): Non-reactive  Fasting glucose (11/06/20): Normal  Lipid panel (11/06/20): Normal  Vitamin D (11/06/20): Normal  HgBA1C (04/18/20): Normal    ASSESSMENT:     47yo with amenorrhea (attributed to Endometrial Ablation in [address]), who presents as a new patient referral from Dr. Alvina Axon, regarding further evaluation and management of a newly diagnosed Carcinoid tumor of the SMALL INTESTINE, based on a Laparotomy small bowel resection and lymph node dissection (11/19/22), by Dr. Loel Ring. PET/CT-scan is consistent with persistent Somatostatin Receptor 2 (SSTR2) expression in the previously seen D4 segment adjacent  lesion (SUVmax 14.6, prior SUVmax 9.6), D3-D4 segment adjacent lesion (SUVmax of 13.2, prior SUVmax 11.9), persistent central small bowel mesenteric LN / mass (SUVmax 52, prior SUVmax 53.1), grossly unchanged in size. Also, a coincedental findings of an interval development of new SSTR2 expressing lesion in the vaginal vestibule (SUVmax 15) (09/23/23). She presents to determine further management.    PLAN:     As this was my 1st visit with Wilfred Hanlon, I have extensively reviewed the medical records. A discussion was held regarding Ulanda Lacross's diagnosis and plan of care. She verbalized a good understanding.    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 Management per Dr. Alvina Axon  She has been counseled regarding her abnormal symptoms, normal pelvic exam findings and radiographic imaging results, with respect to the [facility] expressing lesion in the vaginal vestibule (SUVmax 15) noted on PET/CT-scan (09/23/23). She has been counseled regarding the normal pelvic exam today. No obvious associated mass is noted on exam today. She has been counseled that no specific interventions are necessary.  Return to the office as needed or for any abnormal symptoms. Return precautions including, but not limited to abnormal vaginal bleeding, abnormal GI or GU symptoms, and abdominal / pelvic pain have been reviewed.    2. Cervix Dysplasia (Diagnosed 2001)  The patient has verbalized a good understanding of this diagnosis. She reports to have been treated with LEEP (2001). Endocervix component has been absent on prior pap tests.  A pap test, with HPV screen has been collected today. Management will depend on the results of this test.  The importance of surveillance exams, with regular pap testing has been stressed to the patient today.     3. Health Maintenance:  Not up to date  The patient has been asked to follow up with her  PCP  Tdap (Not UTD). COVID-19 (Not UTD). Despite a discussion held regarding the personal and community benefits, with minimal risks of the COVID-19 vaccine, the patient declines to accept vaccination.  HgBA1C and fasting Lipid panel either not done or not available for review    DISPOSITION:     The patient verbalized understanding of our extensive discussion today and of follow up instructions. All questions were answered to her apparent satisfaction. Therefore, she agreed to proceed as planned. Return to the office as needed or for any abnormal symptoms. Return precautions have been reviewed.      TIME SUMMARY:  60 minutes F2F time  has been coded  Total non-billable time spent: 120 minutes.  In total, 60 minutes  included records review, independent interpretation of findings, ordering medications, tests or procedures, referring and communicating with other health care professionals, coordination of care and chart completion outside of the date service (DATES: 11/04/23)  In total, 120 minutes included records review, obtaining history, performing the examination and / or evaluation, independent interpretation of findings, patient counseling / teaching, face to face discussion with the patient, ordering tests, communicating with other health care professionals, coordination of care and chart completion on the day of service (F2F START TIME: 0820. F2F STOP TIME: 0920).      [place], MD  Gynecologic Oncology

## 2023-11-05 NOTE — Progress Notes (Deleted)
 [facility] Gynecologic Oncology  [address], [address]  [facility] [place]  [address]    GYNECOLOGIC ONCOLOGY    PATIENT NAME: Monica Wood  MRN: [id number]  DATE: ***      TREATMENT SUMMARY:  THESE RECORDS HAVE BEEN REVIEWED, UNLESS OTHERWISE INDICATED     *** USE A TWO DIGIT MONTH / TWO DIGIT DAY / TWO DIGIT YEAR FORMAT FOR THE DATES. DELETE THIS INSTRUCTION WHEN DONE ***    Stage {sdcagrade:39378} carcinoma of the UTERUS/vagina/cervix,Ovary/fallopian Tube/Peritoneum (Diagnosed ***)  The patient reports to have experienced symptoms of post-menopausal bleeding    DIAGNOSTIC STUDIES:  IMAGING EVALUATION:  *** USE A TWO DIGIT MONTH / TWO DIGIT DAY / TWO DIGIT YEAR FORMAT FOR THE DATES. DELETE THIS INSTRUCTION WHEN DONE ***  CXR (***): *** LINE FROM IMPRESSION ***  Pelvic Ultrasound (***). Transabdominal and Transvaginal Views  *** INSERT PERTINENT POSITIVES and PERTINENT NEGATIVES ***  CT-scan CAP (*** IV CONTRAST or IV & PO CONTRAST or No Contrast ***) (***)  *** INSERT PERTINENT POSITIVES and PERTINENT NEGATIVES ***  LAB EVALUATION:  *** USE A TWO DIGIT MONTH / TWO DIGIT DAY / TWO DIGIT YEAR FORMAT FOR THE DATES. DELETE THIS INSTRUCTION WHEN DONE ***  CA125 (***): ***  CEA (***): ***   CA19-9 (***): ***  Inhibin A (***): ***  Inhibin B (***): ***  LDH (***): ***  AFP (***): ***  HCG (***): ***  OVA1 (***): ***  CA125-II (***): ***  HE4 (***): ***     DIAGNOSIS ESTABLISHED:   Dilation & Curettage, hysteroscopy (***), under the care of Dr. Woodford Hayward: Grade ***, *** carcinoma. COMMENT: ER/PR AND P16 {NEGATIVE/POSITIVE:304500044}. WT1, Napsin-A, P53 {NEGATIVE/POSITIVE:304500080}    SURGICAL TREATMENT: Robotic asssisted hysterectomy + BSO + Bilateral Pelvic and Common Iliac Sentinel LND (***), under the care of Dr. Hortense Lyons   Intra-operative findings are consistent with a "normal sized uterus. Bilateral atrophic ovaries. No peritoneal fluid. No peritoneal implants. No suspicious LNs. The uterus was  opened in the operating room and there was about 3.5x2.5cm raised plaque of tumor on the posterior fundal aspect of the endometrial cavity and there was a smaller lesion in the anterior upper fundus. There was no extension to the lower uterine segment or cervix. The lesion appeared to be invasive, but not deeply invasive. Frozen section confirmed these findings. Histologic subtype was not able to be established, but the lesion appeared high grade"  Final pathology is consistent with a ***gm uterus, with a {sdcashort:39379} carcinoma, invading ***cm into a ***cm myometrial thickness. Cervix and Bilateral adnexa are not involved with tumor. Absent LVSI. Regional lymph nodes are uninvolved with tumor (2 negative LEFT pelvic sentinel LNs, 2 negative LEFT pelvic LNs, 4 negative RIGHT pelvic sentinel LNs). Negative pelvic washings. COMMENT: Review by *** demonstrates ***. Block ***: P16 tumor strongly {NEGATIVE/POSITIVE:304500080}. P53 {NEGATIVE/POSITIVE:304500080}. ER/PR {NEGATIVE/POSITIVE:304500044}. Block ***: P16 and P53 {NEGATIVE/POSITIVE:304500044}  {NEGATIVE/POSITIVE:304500044} for a defective mismatch repair IHC. Positive stains to MLH1, MSH2, MSH6, and PMS2 is a normal finding and requires no further testing unless there is a family history that is high risk for Lynch syndrome    ADJUVANT TREATMENT: *** cycles Carboplatin AUC 6 + 175mg /m2 Taxol (C1: ***. C6: ***), with growth factor support, under the care of Dr. Aaron Aas C*** delayed due to thrombocytopenia and anemia. Chemotherapy complicated by *** hospital admission for dehydration and "low counts" and received PRBCs. This was followed by 3 fractions of Vaginal Vault Brachytherapy, under the care of Dr. Aaron Aas (  Completion Date: ***). Records have been {sdrequest:39380}.    HEALTH MAINTENANCE:     She denies to have any history of cervix dysplasia. Last pap test normal per patient (***, a copy of the report {sdreviewed:39381}. *** No further pap testing is necessary  ***  Negative for intraepithelial lesion or malignancy. No comment on endocervix component or Endocervix component present or Endocervix component absent  HPV negative    Mammogram (***): *** Normal per patient (if no records are available) OR BR*** (if bi-rads are listed ... BR stands for bi-rads and then list the number) ***. Aaron Aas Records *** have been reviewed OR are unavailable for review at the time of this visit ***. Next due ***    Colorectal Cancer Screening, by (*** what procedure ***) (***), (*** by what provider name ***): Normal per patient. Records *** have been reviewed OR are unavailable for review at the time of this visit ***. Next due ***    Bone density (***): Normal per patient. Records *** have been reviewed OR are unavailable for review at the time of this visit ***. Next due ***    Vaccines: Tdap ({sdutd:39413}). Shingles ({sdutd:39413}). Pneumonia ({sdutd:39413}). Influenza ({sdutd:39413}). COVID-19 ({sdutd2:39414}). HPV ({sdutd:39413})    The following laboratory results were reviewed:    *** RECORD THE NORMAL HEMOGLOBIN VALUE AND NORMAL CREATININE VALUE. DO NOT RECORD ALL THE NORMAL VALUES. DELETE THIS INSTRUCTION WHEN DONE ***  *** DO NOT DELETE THE WORD "NORMAL" before Hemoglobin and before Creatinine if these values are normal. If these values are abnormal, then delete the word normal. DELETE THIS INSTRUCTION WHEN DONE ***  *** USE A TWO DIGIT MONTH / TWO DIGIT DAY / TWO DIGIT YEAR FORMAT FOR THE DATES. DELETE THIS INSTRUCTION WHEN DONE ***    CBC (***/***/***): Normal Hemoglobin ***  CMP / BMP *** PICK ONE (either the CMP or BMP ... NOT BOTH). DELETE THIS INSTRUCTION WHEN DONE *** (***/***/***): Sodium *** (L), Glucose *** (H), Calcium *** (L), eGFR *** (L). Normal Creatinine ***  TSH (***/***/***): Normal   Fasting glucose / HgBA1C *** PICK ONE (either the Fasting glucose or HgBA1C ... NOT BOTH). DELETE THIS INSTRUCTION WHEN DONE *** (***/***/***): ***  Lipid panel (***/***/***):  ***    TSH, HgBA1C and Lipid panel either not done or not available for review *** DELETE IF THIS DOES NOT APPLY ***    *** SIGN YOUR SIGNATURE ***        *** ON FOLLOW UP PATIENTS ADD ONLY THE UPDATED IMAGING AND LABS SINCE PATIENT'S LAST VISIT WITH ME. DO NOT ADD ALL THE INFORMATION I HAVE IN MY NOTE ALL READY ***   *** DELETE THIS INSTRUCTION WHEN DONE ***

## 2023-11-05 NOTE — Progress Notes (Unsigned)
 Review of Systems   Constitutional:  Negative for appetite change, chills, diaphoresis, fatigue, fever and unexpected weight change.   HENT:   Negative for hearing loss, lump/mass, mouth sores, nosebleeds, sore throat, tinnitus, trouble swallowing and voice change.    Eyes:  Negative for eye problems and icterus.   Respiratory:  Negative for chest tightness, cough, hemoptysis, shortness of breath and wheezing.    Cardiovascular:  Negative for chest pain, leg swelling and palpitations.   Gastrointestinal:  Positive for abdominal pain (RUQ pain), constipation (sometimes) and diarrhea (sometimes). Negative for abdominal distention, blood in stool, nausea, rectal pain and vomiting.   Endocrine: Negative for hot flashes.   Genitourinary:  Negative for bladder incontinence, difficulty urinating, dyspareunia, dysuria, frequency, hematuria, menstrual problem, nocturia, pelvic pain, vaginal bleeding and vaginal discharge.    Musculoskeletal:  Negative for arthralgias, back pain, flank pain, gait problem, myalgias, neck pain and neck stiffness.   Skin:  Negative for itching, rash and wound.        Bumpy thicker skin to left labia   Neurological:  Negative for dizziness, extremity weakness, gait problem, headaches, light-headedness, numbness, seizures and speech difficulty.   Hematological:  Negative for adenopathy. Does not bruise/bleed easily.   Psychiatric/Behavioral:  Negative for confusion, decreased concentration, depression, sleep disturbance and suicidal ideas. The patient is not nervous/anxious.

## 2023-11-07 LAB — HUMAN PAPILLOMAVIRUS (HPV) DNA PROBE THIN PREP HIGH RISK
HPV, Genotype 16: NOT DETECTED
HPV, Genotype 18: NOT DETECTED
HPV, High Risk Other: NOT DETECTED

## 2023-11-16 LAB — GYN CYTOLOGY

## 2023-11-18 ENCOUNTER — Inpatient Hospital Stay: Admit: 2023-11-18 | Discharge: 2023-11-18 | Payer: PRIVATE HEALTH INSURANCE | Primary: Nurse Practitioner

## 2023-11-18 DIAGNOSIS — D3A019 Benign carcinoid tumor of the small intestine, unspecified portion: Secondary | ICD-10-CM

## 2023-11-18 MED ORDER — OCTREOTIDE ACETATE 30 MG IM KIT
30 | Freq: Once | INTRAMUSCULAR | Status: AC
Start: 2023-11-18 — End: 2023-11-18
  Administered 2023-11-18: 15:00:00 30 mg via INTRAMUSCULAR

## 2023-11-18 MED FILL — SANDOSTATIN LAR DEPOT 30 MG IM KIT: 30 mg | INTRAMUSCULAR | Qty: 1 | Fill #0

## 2023-11-18 NOTE — Progress Notes (Signed)
 Pt here for sandostatin .  Arrives ambulatory.  Denies any new complaints.  Tx complete without incident.  Pt d/c'd in stable condition.  Returns 12/16/23 for MD visit and sandostatin .

## 2023-11-19 MED FILL — FAMOTIDINE 40MG TABS: 40 40 MG | ORAL | 30 days supply | Qty: 30 | Fill #1 | Status: AC

## 2023-11-24 ENCOUNTER — Ambulatory Visit
Admit: 2023-11-24 | Discharge: 2023-11-24 | Payer: PRIVATE HEALTH INSURANCE | Attending: Student in an Organized Health Care Education/Training Program | Primary: Nurse Practitioner

## 2023-11-24 ENCOUNTER — Ambulatory Visit: Admit: 2023-11-24 | Discharge: 2023-11-24 | Payer: PRIVATE HEALTH INSURANCE | Primary: Nurse Practitioner

## 2023-11-24 VITALS — BP 108/56 | HR 60

## 2023-11-24 DIAGNOSIS — M7989 Other specified soft tissue disorders: Secondary | ICD-10-CM

## 2023-11-24 DIAGNOSIS — R6882 Decreased libido: Secondary | ICD-10-CM

## 2023-11-24 NOTE — Progress Notes (Signed)
 [facility], [facility]   HEALTH - [place] UROGYNECOLOGY AND PELVIC REHABILITATION   [address] [place] [address]  Dept: [phone]  Date: 11/24/2023  Patient Name: Monica Wood    VISIT - FOLLOW UP VISIT     CC: had concerns including Injections (Patient here for her Depotestosterone injection 25 mg /Patient has noticed improvement with her libido ).    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 Patient is seeing Oncology. Lesion for in vaginal vault. Consult at [facility]. Last seen for testosterone injection 10/15/23 which is helping with low libido. In consult with Oncology (Dr. Modesto Andreas), he felt testosterone therapy was not contraindicated with her situation.  History of Present Illness  The patient presents for evaluation of a carcinoid tumor and subclavian thrombosis.    She reports that her carcinoid tumor is stable, as confirmed by the most recent PET scan. She has been experiencing numbness in her arm when bent, as well as numbness in her leg and from the base of her skull down into her neck. Despite these symptoms, her blood pressure readings have been within normal limits. She has a scheduled appointment with a vascular surgeon today, not for surgical intervention, but to address these symptoms. She has been on testosterone therapy, which she reports as beneficial. Additionally, she has been using Vivelle -Dot estrogen patches but has been without them for approximately a week due to running out of supply. She had requested a refill at [facility] but has not received any response.    She has a history of circulatory issues spanning several years, which have remained undiagnosed. She underwent testing a few months ago, but her condition has not improved. Although no clot has been identified in her right subclavian vein, her symptoms persist. She is not  currently on any anticoagulant therapy.       Overall state of well-being, dietary and nutritional habits, exercise routines of at least 30 minutes 3 times a week, bowel and bladder function, smoking history, HRT history, sexual activity and partner/s, dyspareunia, and immunizations all revisited if necessary by chart review or direct questioning.    Topics of Prolapse, Pain, Pressure were revisited including type, period of onset, level of severity, quality and quantity, associations, trends, exacerbators, alleviators, bleeding, prolapse to reduce, splinting, and prior treatment / surgery.    Topics of Urinary Leakage were revisited including type, period of onset, level of severity, quality and quantity, associations, trends, exacerbators, alleviators, urgency, frequency, nocturia ,urge incontinence / stress incontinence triggers, hesitancy, obstruction with need to catheterize, frequent UTI"s >3 in a year diagnosed by culture, hematuria (gross or microscopic), urinary stones, and prior treatment / surgery.    Topics of Fecal Incontinence were revisited including type, period of onset, level of severity, quality and quantity, associations, trends, exacerbators, alleviators, times per day/week, stool quality / quantity, rectal bleeding, hemorrhoids, constipation / Anismus / Proctalgia fugax, laxative abuse, and prior treatment / surgery.    Topics of General Gynecology were revisited including gravity/parity, #SVD's, perineal delivery trauma, LMP, days of flow, heaviest days, # pads / tampons per day, cramps, anemia, discharge, prior STI's.    Flowsheet:  Screenings reviewed per Flowsheet including Pap smear, mammogram, dexascan, colonoscopy. Any screenings due will be ordered for this visit if patient agrees.      ROS:  Review of Systems  Right subclavian pain,  low libido  All other systems negative.    PHYSICAL EXAM:  Objective   Blood pressure (!) 108/56, pulse 60, SpO2 97%.  Physical Exam  Alert and oriented x  3 no respiratory distress abdomen soft and nontender distal pulses 2 over 4 x 4.       VISIT RESULTS:  PET/CT-scan   Jul 10 2023 12:00AM    OUT   0002  -  CT OUTSIDE CD DICOM [name]  / ACCESSION #  [id number]   PROCEDURE REASON:  Tumor Board Discussion    * * * * Physician Interpretation * * * *    EXAMINATION: OUTSIDE IMAGING INTERPRETATION   Indication for the Request / Reason for Overread: Tumor Board Discussion   Service Requesting Consult: Surgery   Any specific Issue(s) to be discussed: Tumor Board discussion   Images Reviewed: Somatostatin Receptor PET-CT performed on 07/10/2023   Overread Date: 08/25/2023 1:06 PM   EXAMINATION: SOMATOSTATIN RECEPTOR PET-CT   CLINICAL HISTORY: Small bowel carcinoid status post resection with   mesenteric metastases on hormonal therapy.  Follow-up exam.   EXAM CATEGORY: Secondary interpretation requested.   TECHNIQUE: PET and CT imaging was acquired per the protocols and   parameters of the outside institution.  The provided imaging may or may   not represent the native source data set and therefore could contain   changes that lower the accuracy of a second-opinion interpretation.    Unenhanced imaging is limited for the evaluation of some pathology.    Physiologic/non-pathologic uptake in some body regions could confound or   obscure some pathology.   *  Scan Range: Skull vertex to thighs   *  Injected activity: 4.0 mCi   *  Uptake Time: 51 minutes   *  Radiopharmaceutical: Cu-64 Dotatate   COMPARISON: Cu-64 Dotatate PET/CT   CORRELATION: MRI 05/16/2023   RESULT:   REFERENCES:   Exam Quality: Fair (images marred by the degree of background noise)   Dotatate uptake serves as a surrogate marker for somatostatin receptor 2   (SSTR2) expression.  All reported standardized uptake values represent   maximum SUV (SUVmax) per body weight, unless otherwise specified.  SUV   Reference Values:   *  Background Liver: SUVmax 6.7   *  Background Spleen: SUVmax 14.3   Localizer Images:  Not provided.   HEAD AND NECK:   Head: No radiotracer avid lesion or mass effect in the intracranial   compartment.   Aerodigestive Tract: No radiotracer avid lesion.   Lymph Nodes: No radiotracer avid lymphadenopathy.   Neck Soft Tissues: No radiotracer avid thyroid nodule.   CHEST:   Lungs & Pleura: No radiotracer avid mass, nodule, or consolidation.  No   pleural effusion.  Note, PET is often not sensitive for lung nodules   smaller than 8 mm.   Lymph Nodes: No radiotracer avid lymphadenopathy.   Mediastinum: No radiotracer avid mass.  Cardiovascular: Blood pool activity.  No pericardial effusion.  Normal   heart size.  Chest Wall: No radiotracer avid soft tissue lesion.   ABDOMEN AND PELVIS:   Hepatobiliary: Heterogeneous background activity.  No focal radiotracer   avid lesion or measurable mass.  Cholecystectomy.  Spleen: No radiotracer avid lesion.  No splenomegaly.   Pancreas: No radiotracer avid lesion.   Adrenals: No radiotracer avid nodule.   Urinary Tract: Physiologic radiotracer excretion in the renal collecting   systems and urinary bladder.  No hydronephrosis.   GI Tract: Right lower quadrant  surgical staple line without a focal   radiotracer avid lesion.  Two similar focal areas of increased   radiotracer uptake along the distal duodenum without a discrete anatomic   correlate and may reflect mesenteric nodes (favored) versus intrinsic   lesions.  For reference:   *  Subcentimeter area along the D4 segment (SUVmax 9.6, previously 12.3;   CT image 198)   *  Subcentimeter area along the D3/D4 segment (SUVmax 11.9, previously   13.4; CT image 205).   Peritoneum: No radiotracer avid lesion.  No ascites.   Lymph Nodes: Few similar radiotracer avid lymph nodes in the central   small bowel mesenteric root.  For example:   *  0.6 cm node (SUVmax 14.3; CT image 213), previously 0.6 cm (previous   SUVmax 12.3)   *  2.6 x 2.1 cm central small bowel mesenteric node (SUVmax 53.1; CT   image 222); previously 2.9 x  1.8 cm (previously 48.7; CT image 222)   Vasculature: Blood pool activity.  Vascular calcifications without an   abdominal aortic aneurysm.   Pelvic Organs: No radiotracer avid lesion.   MUSCULOSKELETAL:   Bones: No radiotracer avid or destructive osseous lesion.  Degenerative   changes.   Soft Tissues: No radiotracer avid lesion.  Surgical changes in the   ventral abdominal wall.   IMPRESSION:   PRIMARY DISEASE SITE AND NODAL DISEASE:   *  No SSTR2 expressing recurrent mass about the small bowel anastomosis.   *  Stable SSTR2 expressing small bowel mesenteric nodal metastases.   *  Stable areas of focal SSTR2 expression along the distal duodenum that   could reflect mesenteric nodal metastases (favored) or intrinsic lesions.   METASTATIC DISEASE:   *  No SSTR2 expressing distant metastases.   Krenning Score: 3-4   *  Score 0: No abnormal uptake   *  Score 1: Very low uptake   *  Score 2: Uptake less than or equal to the liver   *  Score 3: Uptake greater than liver but less than spleen   *  Score 4: Uptake greater than the spleen         Office pelvic ultrasound (09/17/23)  TRANSVAGINAL ULTRASOUND  UTERUS-NABOTHIAN CYSTS AND HETEROGENOUS. LARGEST FOCAL AREA-2.5 x 2.8 x 2.4 cm  BILATERAL OVARIES APPEAR UNREMARKABLE     09/23/23  PET/CT NEUROENDOCRINE WB  / ACCESSION #  [id number]   PROCEDURE REASON: Malignant carcinoid tumor, unspecified site (HCC)   * * * * Physician Interpretation * * * *   EXAMINATION: SOMATOSTATIN RECEPTOR PET-CT   CLINICAL HISTORY: History of small bowel carcinoid disease status post   resection.   EXAM CATEGORY: Subsequent treatment strategy.   TECHNIQUE: Radiopharmaceutical was administered IV followed by PET   imaging from the skull vertex to thighs.  Free breathing, low dose CT of   the same body region was acquired without IV contrast for attenuation   correction and anatomic localization.  Unenhanced imaging is limited for   the evaluation of some pathology and the acquired CT was not  designed to   produce diagnostic CT scan quality.  Physiologic/non-pathologic uptake in   some body regions could confound or obscure some pathology.   *  CT Dose-Length Product (DLP): 492 mGy*cm   *  CT Dose Reduction Employed: Yes   *  Injection site: Right Hand   *  Injected activity: 5.7 mCi   *  Uptake Time: 60 minutes   *  Radiopharmaceutical: Ga-68 Dotatate   COMPARISON: Cu-64 Dotatate PET/CT 07/10/2023   CORRELATION: MRI 06/03/2023   RESULT:   REFERENCES:   Dotatate uptake serves as a surrogate marker for somatostatin receptor 2   (SSTR2) expression.  All reported standardized uptake values represent   maximum SUV (SUVmax) per body weight, unless otherwise specified.  SUV   Reference Values:   *  Background Liver: SUVmax 7.0   *  Background Spleen: SUVmax 22.8   Localizer Images: Unremarkable.   HEAD AND NECK:   Head: No radiotracer avid lesion or mass effect in the intracranial   compartment.  Aerodigestive Tract: No radiotracer avid lesion.   Lymph Nodes: No radiotracer avid lymphadenopathy.   Neck Soft Tissues: No radiotracer avid thyroid nodule.   CHEST:   Lungs & Pleura: No radiotracer avid mass, nodule, or consolidation.  No   pleural effusion.   Lymph Nodes: No radiotracer avid lymphadenopathy.   Mediastinum: No radiotracer avid mass.   Cardiovascular: Blood pool activity. No pericardial effusion.   Chest Wall: No radiotracer avid soft tissue lesion.   ABDOMEN AND PELVIS:   Hepatobiliary: No radiotracer avid lesion.  No measurable mass.   Spleen: No radiotracer avid lesion.  No splenomegaly.   Pancreas: No radiotracer avid lesion.   Adrenals: No radiotracer avid nodule.   Urinary Tract: Physiologic radiotracer excretion in the renal collecting   systems and urinary bladder.  No hydronephrosis.   GI Tract/Lymph Nodes: Persistent SSTR2 expression in the previously seen   D4 segment adjacent lesion with SUV max of 14.6, prior SUV max of 9.6, as   well as the other D3-D4 segment adjacent lesion SUV max of  13.2, prior   SUV max of 11.9. Persistent central small bowel mesenteric lymph   nodes/mass with SUV max of 52.0, prior SUV max of 53.1, grossly stable in   size. No bowel dilation.   Peritoneum: No radiotracer avid lesion. No ascites.   Vasculature: Blood pool activity.   Pelvic Organs: Interval development of new SSTR2 expressing lesion in the   vaginal vestibule with SUV max of 15.0.   MUSCULOSKELETAL:   Bones: No radiotracer avid lesion.  Mildly diffuse SSTR2 expression in   the vertebral body of C7/C8 and L5 likely related to degenerative disease   SUV max of 6.6, 6.8 respectively.   Soft Tissues: No radiotracer avid lesion.      CT ABDOMEN AND PELVIS WITH IV CONTRAST (09/23/23)  CLINICAL HISTORY: History of small bowel carcinoid tumor status post   resection.   TECHNIQUE: CT of the abdomen and pelvis was performed using standard   technique, scanning from just above the dome of the diaphragm to the   symphysis pubis.   MQ:  CTAP_3   Contrast:   IV: 100 ml of Omnipaque 350   CT Radiation dose: Integrated Dose-length product (DLP) for this visit =    513 mGy*cm.   CT Dose Reduction Employed: Automated exposure control(AEC) and iterative   recon   COMPARISON: Dotatate PET/CT dated 09/23/2023, 06/20/2023, and 12/22/2022;   outside CTA chest abdomen and pelvis dated 04/17/2023; MRI abdomen dated   05/16/2023.   RESULT:   Liver: No mass. Normal morphology.   Biliary: No intrahepatic or extrahepatic biliary ductal dilation.  Status   post cholecystectomy.   Spleen: No mass. No splenomegaly.   Pancreas: No mass or duct dilation.   Adrenals: No mass.   Kidneys: Punctate nonobstructive left lower pole renal calculus (8:62).   No mass or  hydronephrosis.  Symmetric renal enhancement.   GI tract: No dilation or wall thickening.   Lymph nodes: Stable appearance of the central small bowel mesenteric node   which measures 2.8 x 2.1 cm, unchanged since 04/17/2023 (9:220).  A few   additional stable mesenteric lymph nodes are  noted, which demonstrated   Dotatate uptake.  For reference: 0.6 x 0.5 cm more superior central   mesenteric lymph node (3:68).  A couple tiny of additional periduodenal   lymph nodes appear stable measuring between 0.7 and 1.0 cm; these may   correspond to areas of Dotatate uptake (6:62 and 6:65) (3:54 and 3:56).   Mesentery/Peritoneum: No ascites or mass.   Retroperitoneum: No mass.   Vasculature:    - Abdominal aorta and iliac arteries: No aneurysm.    - Celiac and SMA: Patent without stenosis.    - Portal venous system (SMV, splenic vein, portal vein and branches):   Patent.    - Hepatic veins: Patent.   Pelvis: No mass, ascites or fluid collection.   Bones/Soft Tissues: No suspicious lesion.   Lower thorax: Unremarkable.      LAB EVALUATION:  Chromagranin (11/17/22): 104 (normal) => 77 (normal) (11/26/22) => 123 (normal) (12/11/22)  CA125 (11/17/22): 14 (normal)  CA19-9 (11/17/22): 7 (normal)  CEA (11/17/22): 1 (normal)                DIAGNOSIS ESTABLISHED:   Laparotomy small bowel resection and lymph node dissection (11/19/22), by Dr. Loel Ring  Intra-operative findings are consistent with a _  11/19/22  Final Diagnosis  A.  MID SMALL BOWEL, RESECTION:  -MULTIPLE (NUMBER OF TUMORS: 13) WELL-DIFFERENTIATED NEUROENDOCRINE  TUMORS, WELL-DIFFERENTIATED, LARGEST SIZE 2.8 CM, EXTENDING THROUGH  MUSCULARIS PROPRIA INTO SUBSEROSAL TISSUE WITHOUT PENETRATION OF  OVERLYING SEROSA, MARGINS NEGATIVE.  -ONE (1) LYMPH NODE NEGATIVE FOR NEOPLASM.  B.  MESENTERIC LYMPH NODE, INCISIONAL BIOPSY:  -ONE (1) OF TWO (2) LYMPH NODES INVOLVED BY NEUROENDOCRINE TUMOR.  C.  MESENTERIC LYMPH NODE, INCISIONAL BIOPSY:  -FRAGMENTS OF LYMPH NODE INVOLVED BY NEUROENDOCRINE TUMOR.  SEE  COMMENT.  Diagnosis Comment  C.  It cannot be determined if the fragments of lymph node are  multiple individual lymph nodes or are one lymph node in multiple  pieces.      ASSESSMENT/PLAN:  Low libido  Bloating symptom  Urinary frequency  Urinary  urgency  Menopausal symptoms  Urinary incontinence, mixed  Malignant carcinoid tumor of small intestine, unspecified location (HCC)  Subclavian vein stenosis, right       No orders of the defined types were placed in this encounter.       The patient was counseled regarding review of all conditions discussed.     2.   Educational handouts were discussed & given when applicable.     3.   Testing recommendations were given as indicated.     4.   Detailed questionnaires regarding the patient's specific condition were given to the patient when indicated. The patient understands that these are her responsibility to complete and return on a voluntary basis. Reminders to complete the questionnaires will not be given. The patient understands that failure to return the questionnaires may not give the provider a full picture of the patient's urogynecologic issues and make treatment less than optimal despite the practitioner's best effort to obtain information.     Assessment & Plan  1. Carcinoid tumor.  - Carcinoid tumor is stable based on the last PET scan.  - Testosterone therapy has  been beneficial but will be deferred until further evaluation by the vascular surgeon due to potential clot risk.  - Hold off on using the Vivelle -Dot estrogen patch until deemed safe.  - Letter to be sent to Dr. Wendel Hals for input on continuation of treatments.  - Nonhormonal alternatives such as guided imagery suggested, with relevant book references to be provided.    2. Subclavian thrombosis.  - Symptoms suggestive of subclavian thrombosis include numbness in the arm, leg, and from the base of the skull down into the neck.  - No definitive clot identified; scheduled to see Dr. Wendel Hals today for further evaluation.  - Hormone therapy, including testosterone and estrogen patches, will be held until clearance is obtained from Dr. Meredeth Stallion.  - Consideration of prophylactic blood thinners if recommended by Dr. Meredeth Stallion.    Multiple records  reviewed. All questions were addressed to the patient's satisfaction.      Additional counseling time (minutes) spent regarding today's visit:  20 Performing: ACVISITTIMEPERFORM: Pre-visit chart review and note construction, Extensive counseling, Covering additional health topics on top of those listed in the chief complaint, and Additional testing and or procedures      Point of care: The treating physician provided 100% of care and consultation for this encounter.    Follow up: Return if symptoms worsen or fail to improve.     The patient (or guardian, if applicable) and other individuals in attendance with the patient were advised that Artificial Intelligence will be utilized during this visit to record, process the conversation to generate a clinical note and to support improvement of the AI technology. The patient (or guardian, if applicable) and other individuals in attendance at the appointment consented to the use of AI, including the recording.      An electronic signature was used to authenticate this note.    --Raynette Cam, DO

## 2023-11-24 NOTE — Patient Instructions (Signed)
 [facility]    SEXUAL HEALTH DISORDERS  Sexual health disorders in women include decreased libido (desire to have sex), difficulty with arousal, hypersexuality, difficulty or inability to achieve orgasm, painful intercourse and painful orgasm, and others. There are many causes that contribute to these problems. Some include fatigue and distraction, hormonal abnormalities, chronic illness, depression, relationship problems, medication side effects, vaginismus, vaginal atrophy, history of sexual abuse, and adrenal problems.     Many women feel uncomfortable talking about their sexual health, but sexual health is very important! Proven benefits of sexual activity include decreased diastolic blood pressure, decreased stress incontinence, increased cerebral blood flow, decreased depression, decreased pain, better sleep, healthier immune system, improved sense of well-being, and up to a 50% reduction in myocardial infarction (heart attack), and better relationships. In short, there are plenty of good reasons to talk to your physician or nurse practitioner about your sexual health concerns.     There are many tests and treatments available for sexual dysfunction. Depending on the problem, your health care provider can offer guidance on these issues. Some of the many treatments include:  Sexual health counselling  Hormone Replacement Therapy  Medications  Exercise  Pelvic floor physical therapy  Vaginal lubricants        Recommended Resources for You:    Books-  Becoming Orgasmic: A Sexual and Personal Growth Program for Women, by Alric Asp. Heiman  Bonk, by Princess Brooks  How to Get Over 5 Internal Roadblocks to Good Sex, by Dorethia Ganser, M.D.  Mating in [place], by Eulah Heymann    Websites-  [employer]: www.ReclaimYourSexuality.com  Locate a sex therapist by state: www.SSTARNET.org  Excellent website for sexual health: www.Vaginismus.com  [employer] for  painful sex, low libido, anorgasmia, HRT info, & sex therapy  Wide selection of books & videos to build relationships & heal trauma  Dilator and vibrator guides to build confidence in selecting the correct product  Fun guided imagery & healthy erotica to spice up the bedroom  Excellent website for vibrator: www.ticklervibes.com  Knowledgeable customer service & discreet shipping  Wide variety of products to fit your individual needs  Excellent quality - you get what you pay for!

## 2023-11-24 NOTE — Progress Notes (Signed)
 [facility], [facility]  [address]  [address]  [place] [address]  Dept: [phone]     Patient: Monica Wood  DOB: 1975-08-21  MRN: [id number]  DOS: 11/24/2023    HPI:  11/24/23: Monica Wood is a 48 y.o. female who comes to the office regarding evaluation of her arms and legs for discomfort.  She has history of carcinoid tumor.  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 This discomfort has been going on for years. She is seeing gynecology for testosterone injection but there was concern if she can undergo this therapy or if there is risk for blood clot.  She has arterial studies from January 2025 which show normal ABIs and normal WBIs. There are multiphasic upper and lower extremity arterial waveforms.    Past Medical History:   Diagnosis Date    Anemia     Anxiety and depression     Arthritis     Breast pain     Bursitis     Carcinoid tumor (HCC)     Chronic diarrhea     Depression 2020    Digestive problems     Female genital prolapse, unspecified     Fibromyalgia     Irritable bowel syndrome 2001    Obesity 1985    Osteoarthritis 2019    Retention of urine     Scoliosis     STD (sexually transmitted disease)     Chlamydia in college    Tendinitis     Tension headache     Urinary incontinence     Vitamin D deficiency     Vitamin D deficiency      Family History   Problem Relation Age of Onset    Diabetes Maternal Grandmother     Diabetes Father     Diabetes Mother     Allergy (Severe) Mother     Anemia Mother     Arthritis Mother     Depression Mother     Other Mother         ADHD    Breast Cancer Neg Hx     Ovarian Cancer Neg Hx     Endometrial Cancer Neg Hx     Uterine Cancer Neg Hx     Cervical Cancer Neg Hx     Vaginal Cancer Neg Hx     Colon Cancer Neg Hx       Social History     Socioeconomic History    Marital status: Married     Spouse name: Not on file    Number of children: Not on file     Years of education: Not on file    Highest education level: Not on file   Occupational History    Not on file   Tobacco Use    Smoking status: Never    Smokeless tobacco: Never   Vaping Use    Vaping status: Never Used   Substance and Sexual Activity    Alcohol use: Not Currently     Alcohol/week: 4.0 standard drinks of alcohol     Types: 4 Glasses of wine per week     Comment: 3/4 times a week    Drug use: Not Currently     Comment: uses gummies periodically for pain control    Sexual activity: Not Currently     Partners: Male   Other Topics Concern  Not on file   Social History Narrative    Not on file     Social Drivers of Health     Financial Resource Strain: Low Risk  (12/01/2022)    Overall Financial Resource Strain (CARDIA)     Difficulty of Paying Living Expenses: Not hard at all   Food Insecurity: No Food Insecurity (10/30/2023)    Received from [facility]    Hunger Screening     Within the past 12 months we worried whether our food would run out before we got money to buy more.: Never True     Within the past 12 months the food we bought just didn't last and we didn't have money to get more.: Never True   Transportation Needs: No Transportation Needs (07/30/2023)    PRAPARE - Therapist, art (Medical): No     Lack of Transportation (Non-Medical): No   Physical Activity: Insufficiently Active (02/16/2022)    Exercise Vital Sign     Days of Exercise per Week: 4 days     Minutes of Exercise per Session: 30 min   Stress: Not on file   Social Connections: Not on file   Intimate Partner Violence: Not At Risk (02/16/2022)    Humiliation, Afraid, Rape, and Kick questionnaire     Fear of Current or Ex-Partner: No     Emotionally Abused: No     Physically Abused: No     Sexually Abused: No   Housing Stability: Low Risk  (07/30/2023)    Housing Stability Vital Sign     Unable to Pay for Housing in the Last Year: No     Number of Times Moved in the Last Year: 0     Homeless in the  Last Year: No      Past Surgical History:   Procedure Laterality Date    ABDOMINOPLASTY      COLONOSCOPY N/A 11/18/2022    COLONOSCOPY POLYPECTOMY SNARE BIOPSY performed by Rita Cherry, MD at [facility]    ENDOMETRIAL ABLATION  2008    ESOPHAGOGASTRIC FUNDOPLICATION  2000    GALLBLADDER SURGERY      LAPAROTOMY N/A 11/19/2022    LAPAROTOMY EXPLORATORY.  SMALL BOWEL RESECTION.  INCISIONAL BIOPSY OF MESENTERIC LYMPH NODE performed by Marshall Skeeter, MD at [facility] OR    OTHER SURGICAL HISTORY      ablation    TUBAL LIGATION      UPPER GASTROINTESTINAL ENDOSCOPY N/A 11/18/2022    ESOPHAGOGASTRODUODENOSCOPY BIOPSY performed by Rita Cherry, MD at [facility] [place]      Review of Systems   Constitutional:  Negative for activity change, fever and unexpected weight change.   HENT:  Negative for trouble swallowing and voice change.    Eyes:  Negative for pain and visual disturbance.   Respiratory:  Negative for cough and chest tightness.    Cardiovascular:  Negative for chest pain and palpitations.   Gastrointestinal:  Negative for abdominal distention, abdominal pain and vomiting.   Endocrine: Negative for cold intolerance and heat intolerance.   Genitourinary:  Negative for dysuria, flank pain and hematuria.   Musculoskeletal:  Negative for joint swelling and neck pain.   Skin:  Negative for color change and rash.   Allergic/Immunologic: Negative for immunocompromised state.   Neurological:  Negative for syncope, speech difficulty, weakness, numbness and headaches.   Hematological:  Negative for adenopathy.   Psychiatric/Behavioral:  Negative for behavioral problems and suicidal ideas.  Vitals:    11/24/23 1428 11/24/23 1438   BP: (!) 115/50 (!) 93/51   BP Site: Right Upper Arm Left Upper Arm   Patient Position: Sitting Sitting   BP Cuff Size: Large Adult Large Adult   Pulse: 88    Resp: 18    SpO2: 98%    Height: 1.626 m (5\' 4" )         Physical Exam  Constitutional:       General: She is not in acute  distress.  HENT:      Mouth/Throat:      Mouth: Mucous membranes are moist.      Pharynx: Oropharynx is clear.   Eyes:      General: No scleral icterus.     Extraocular Movements: Extraocular movements intact.      Conjunctiva/sclera: Conjunctivae normal.   Cardiovascular:      Rate and Rhythm: Normal rate and regular rhythm.      Heart sounds: No murmur heard.     Comments: Palpable upper and lower extremity pulses. No obvious swelling.  Pulmonary:      Effort: No respiratory distress.      Breath sounds: No rales.   Abdominal:      General: There is no distension.      Palpations: There is no mass.      Tenderness: There is no abdominal tenderness. There is no guarding.   Musculoskeletal:      Cervical back: No rigidity or tenderness.   Lymphadenopathy:      Cervical: No cervical adenopathy.   Skin:     Coloration: Skin is not jaundiced.      Findings: No rash.   Neurological:      General: No focal deficit present.      Mental Status: She is alert and oriented to person, place, and time.      Cranial Nerves: No cranial nerve deficit.   Psychiatric:         Mood and Affect: Mood normal.       Assessment:  1. Arm swelling    2. Leg swelling      Plan:  Due to concern for DVT in the setting of testosterone therapy, will order upper and lower extremity venous duplex.  She will follow-up in 2 weeks after imaging is complete.    Electronically signed by:  Alfonso Ike, MD

## 2023-11-25 ENCOUNTER — Ambulatory Visit
Admit: 2023-11-25 | Discharge: 2023-11-25 | Payer: PRIVATE HEALTH INSURANCE | Attending: Physician Assistant | Primary: Nurse Practitioner

## 2023-11-25 VITALS — Resp 14 | Ht 64.0 in | Wt 217.0 lb

## 2023-11-25 DIAGNOSIS — M7062 Trochanteric bursitis, left hip: Secondary | ICD-10-CM

## 2023-11-25 MED ORDER — METHYLPREDNISOLONE ACETATE 80 MG/ML IJ SUSP
80 | Freq: Once | INTRAMUSCULAR | Status: AC
Start: 2023-11-25 — End: 2023-11-25
  Administered 2023-11-25: 15:00:00 80 mg via INTRA_ARTICULAR

## 2023-11-25 MED ORDER — BUPIVACAINE HCL 0.25 % IJ SOLN
0.25 | Freq: Once | INTRAMUSCULAR | Status: AC
Start: 2023-11-25 — End: 2023-11-25
  Administered 2023-11-25: 15:00:00 5 mL via INTRA_ARTICULAR

## 2023-11-25 NOTE — Progress Notes (Signed)
 [facility]  [address]  [place] [address]  Dept Phone: [phone]  Dept Fax: [phone]      Orthopaedic Trauma Clinic Follow Up      Subjective:       Monica Wood is a 48 y.o. year old female who presents to the clinic today for follow up bilateral hip pain.  Patient has had intermittent lateral pain in both hips she was initially seen in 2022 and found to have evidence of trochanteric bursitis.  She has done very well with multiple rounds of corticosteroid injections, most recent on 12/25/2022.  She returns today stating over the last couple weeks her pain has gradually returned in bilateral hips and requesting repeat injections    Review of Systems  Gen: no fever, chills, malaise  CV: no chest pain or palpitations  Resp: no cough or shortness of breath  GI: no nausea, vomiting, diarrhea, or constipation  Neuro: no seizures, vertigo, or headache  Msk: Bilateral hip pain  10 remaining systems reviewed and negative    Objective :     Vitals:    11/25/23 1114   Resp: 14   Body mass index is 37.25 kg/m.  General: No acute distress, resting comfortably in the clinic  Neuro: alert. oriented  Eyes: Extra-ocular muscles intact  Pulm: Respirations unlabored and regular.  Skin: warm, well perfused  Psych:   Patient has good fund of knowledge and displays understanding of exam, diagnosis, and plan.  bilateral Hip     Tenderness: Severe tenderness over the bilateral greater trochanter.  Mild tenderness over the proximal IT band.  No tenderness to the anterior posterior hip         Range of Motion:       Extension: 10     Flexion: 120     Internal Rotation: 30     External Rotation: 40     Abduction: 40     Adduction:  30         Muscle Strength       Abduction: 5/5     Adduction: 5/5     Flexion: 5/5          Gait: Antalgic   Faber Test: Negative   Ober Test: Positive         Labs and Imaging:         X-rays taken in clinic previously and preliminarily reviewed by me 12/25/22:  AP  pelvis and lateral view of both hips demonstrate some very early degenerative changes/spur formation with overall maintenance of from acetabular joint space.  There is no evidence of acute fracture, obvious AVN or femoral head flattening.     Assessment:   48 y.o. year old female with bilateral trochanteric bursitis  Plan:   Lengthy discussion had with patient about current clinical state.     Monica Wood is a 48 y.o. old female with bilateral hip pain that is consistent with bilateral trochanteric bursitis  We discussed treatment options available to her, including nonoperative and operative intervention.  At this time I believe she will benefit from conservative management.  Consequently, an extensive home exercise program was provided.  We also discussed use the possibility of a cortisone injection and at this time she has elected to proceed with as described below     I'll see her back my clinic in 4 months for reevaluation but she was encouraged to return or call earlier with questions and/or concerns.    Procedure  Note: Bilateral trochanteric bursa Depo-Medrol  Injection   An informed verbal consent for the procedure was obtained and risks including, but not limited to: allergy to medications, injection, bleeding, stiffness of joint, recurrence of symptoms, loss of function, swelling, drainage, irrigation, need for surgery and pseudo-septic inflammation, were explained to the patient. Also, discussed was the potential for further injections, irrigation and debridement and surgery. Alternate means of treatment have also been discussed with the patient.    Following an appropriate discussion with the patient regarding the risks and benefits of the procedure she consented to proceed. her bilateral trochanteric bursa was prepped using betadine solution and alcohol swab. Using aseptic technique and through a anterolateral approach, her bilateral trochanteric bursa was injected superficially with  2 cc of 0.25%  Marcaine  and subsequently with 1cc of 80mg /mL Depomedrol into the bilateral bursal space. A band aid was applied to the injection site. she tolerated the injection with no immediate adverse reactions.          Electronically signed by Luane Rumps, PA on 11/25/2023 at 12:48 PM    This note is created with the assistance of a speech recognition program.  While intending to generate a document that actually reflects the content of the visit, the document can still have some errors including those of syntax and sound a like substitutions which may escape proof reading.  In such instances, actual meaning can be extrapolated by contextual diversion

## 2023-11-30 ENCOUNTER — Inpatient Hospital Stay
Admit: 2023-11-30 | Payer: PRIVATE HEALTH INSURANCE | Attending: Student in an Organized Health Care Education/Training Program | Primary: Nurse Practitioner

## 2023-11-30 DIAGNOSIS — M7989 Other specified soft tissue disorders: Secondary | ICD-10-CM

## 2023-12-01 NOTE — Telephone Encounter (Signed)
 Patient left voice mail stating she needs to reschedule her 12/16/23 appointments. I tried to call patient back but had to leave a message.

## 2023-12-08 ENCOUNTER — Encounter
Payer: PRIVATE HEALTH INSURANCE | Attending: Student in an Organized Health Care Education/Training Program | Primary: Nurse Practitioner

## 2023-12-15 ENCOUNTER — Inpatient Hospital Stay: Admit: 2023-12-15 | Discharge: 2023-12-15 | Payer: PRIVATE HEALTH INSURANCE | Primary: Nurse Practitioner

## 2023-12-15 ENCOUNTER — Ambulatory Visit
Admit: 2023-12-15 | Discharge: 2023-12-15 | Payer: PRIVATE HEALTH INSURANCE | Attending: Student in an Organized Health Care Education/Training Program | Primary: Nurse Practitioner

## 2023-12-15 VITALS — BP 102/73 | HR 73 | Resp 18 | Ht 64.0 in | Wt 217.0 lb

## 2023-12-15 DIAGNOSIS — D3A019 Benign carcinoid tumor of the small intestine, unspecified portion: Secondary | ICD-10-CM

## 2023-12-15 DIAGNOSIS — M7989 Other specified soft tissue disorders: Secondary | ICD-10-CM

## 2023-12-15 MED ORDER — OCTREOTIDE ACETATE 30 MG IM KIT
30 | Freq: Once | INTRAMUSCULAR | Status: AC
Start: 2023-12-15 — End: 2023-12-15
  Administered 2023-12-15: 15:00:00 30 mg via INTRAMUSCULAR

## 2023-12-15 MED FILL — SANDOSTATIN LAR DEPOT 30 MG IM KIT: 30 mg | INTRAMUSCULAR | Qty: 1 | Fill #0

## 2023-12-15 NOTE — Progress Notes (Signed)
 Pt here for sandostatin .  Arrives ambulatory.  Denies any new complaints.  Tx complete without incident.  Pt d/c'd in stable condition.  Returns 12/25/23 for MD visit.

## 2023-12-15 NOTE — Progress Notes (Signed)
 [facility], [facility]  [address]  [address]  [place] [address]  Dept: [phone]     Patient: Monica Wood  DOB: May 26, 1976  MRN: [id number]  DOS: 12/15/2023    HPI:  11/24/23: Monica Wood is a 48 y.o. female who comes to the office regarding evaluation of her arms and legs for discomfort.  She has history of carcinoid tumor.  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 This discomfort has been going on for years. She is seeing gynecology for testosterone injection but there was concern if she can undergo this therapy or if there is risk for blood clot.  She has arterial studies from January 2025 which show normal ABIs and normal WBIs. There are multiphasic upper and lower extremity arterial waveforms.    12/15/23: I reviewed the upper and lower extremity venous duplex.  There is no evidence of venous thrombosis.    Past Medical History:   Diagnosis Date    Anemia     Anxiety and depression     Arthritis     Breast pain     Bursitis     Carcinoid tumor (HCC)     Chronic diarrhea     Depression 2020    Digestive problems     Female genital prolapse, unspecified     Fibromyalgia     Irritable bowel syndrome 2001    Obesity 1985    Osteoarthritis 2019    Retention of urine     Scoliosis     STD (sexually transmitted disease)     Chlamydia in college    Tendinitis     Tension headache     Urinary incontinence     Vitamin D deficiency     Vitamin D deficiency      Family History   Problem Relation Age of Onset    Diabetes Maternal Grandmother     Diabetes Father     Diabetes Mother     Allergy (Severe) Mother     Anemia Mother     Arthritis Mother     Depression Mother     Other Mother         ADHD    Breast Cancer Neg Hx     Ovarian Cancer Neg Hx     Endometrial Cancer Neg Hx     Uterine Cancer Neg Hx     Cervical Cancer Neg Hx     Vaginal Cancer Neg Hx     Colon Cancer Neg Hx       Social History      Socioeconomic History    Marital status: Married     Spouse name: Not on file    Number of children: Not on file    Years of education: Not on file    Highest education level: Not on file   Occupational History    Not on file   Tobacco Use    Smoking status: Never    Smokeless tobacco: Never   Vaping Use    Vaping status: Never Used   Substance and Sexual Activity    Alcohol use: Not Currently     Alcohol/week: 4.0 standard drinks of alcohol     Types: 4 Glasses of wine per week     Comment: 3/4 times a week    Drug use: Not Currently     Comment: uses gummies periodically  for pain control    Sexual activity: Not Currently     Partners: Male   Other Topics Concern    Not on file   Social History Narrative    Not on file     Social Drivers of Health     Financial Resource Strain: Low Risk  (12/01/2022)    Overall Financial Resource Strain (CARDIA)     Difficulty of Paying Living Expenses: Not hard at all   Food Insecurity: No Food Insecurity (10/30/2023)    Received from [facility]    Hunger Screening     Within the past 12 months we worried whether our food would run out before we got money to buy more.: Never True     Within the past 12 months the food we bought just didn't last and we didn't have money to get more.: Never True   Transportation Needs: No Transportation Needs (07/30/2023)    PRAPARE - Therapist, art (Medical): No     Lack of Transportation (Non-Medical): No   Physical Activity: Insufficiently Active (02/16/2022)    Exercise Vital Sign     Days of Exercise per Week: 4 days     Minutes of Exercise per Session: 30 min   Stress: Not on file   Social Connections: Not on file   Intimate Partner Violence: Not At Risk (02/16/2022)    Humiliation, Afraid, Rape, and Kick questionnaire     Fear of Current or Ex-Partner: No     Emotionally Abused: No     Physically Abused: No     Sexually Abused: No   Housing Stability: Low Risk  (07/30/2023)    Housing Stability Vital Sign      Unable to Pay for Housing in the Last Year: No     Number of Times Moved in the Last Year: 0     Homeless in the Last Year: No      Past Surgical History:   Procedure Laterality Date    ABDOMINOPLASTY      COLONOSCOPY N/A 11/18/2022    COLONOSCOPY POLYPECTOMY SNARE BIOPSY performed by Rita Cherry, MD at [facility]    ENDOMETRIAL ABLATION  2008    ESOPHAGOGASTRIC FUNDOPLICATION  2000    GALLBLADDER SURGERY      LAPAROTOMY N/A 11/19/2022    LAPAROTOMY EXPLORATORY.  SMALL BOWEL RESECTION.  INCISIONAL BIOPSY OF MESENTERIC LYMPH NODE performed by Marshall Skeeter, MD at [facility]    OTHER SURGICAL HISTORY      ablation    TUBAL LIGATION      UPPER GASTROINTESTINAL ENDOSCOPY N/A 11/18/2022    ESOPHAGOGASTRODUODENOSCOPY BIOPSY performed by Rita Cherry, MD at [facility] [facility]      Review of Systems   Constitutional:  Negative for activity change, fever and unexpected weight change.   HENT:  Negative for trouble swallowing and voice change.    Eyes:  Negative for pain and visual disturbance.   Respiratory:  Negative for cough and chest tightness.    Cardiovascular:  Negative for chest pain and palpitations.   Gastrointestinal:  Negative for abdominal distention, abdominal pain and vomiting.   Endocrine: Negative for cold intolerance and heat intolerance.   Genitourinary:  Negative for dysuria, flank pain and hematuria.   Musculoskeletal:  Negative for joint swelling and neck pain.   Skin:  Negative for color change and rash.   Allergic/Immunologic: Negative for immunocompromised state.   Neurological:  Negative for syncope,  speech difficulty, weakness, numbness and headaches.   Hematological:  Negative for adenopathy.   Psychiatric/Behavioral:  Negative for behavioral problems and suicidal ideas.      Vitals:    12/15/23 0810 12/15/23 0815   BP: 121/82 102/73   BP Site: Right Upper Arm Left Upper Arm   Patient Position: Sitting Sitting   BP Cuff Size: Large Adult Large Adult   Pulse: 73    Resp: 18     SpO2: 98%    Weight: 98.4 kg (217 lb)    Height: 1.626 m (5\' 4" )         Physical Exam  Constitutional:       General: She is not in acute distress.  HENT:      Mouth/Throat:      Mouth: Mucous membranes are moist.      Pharynx: Oropharynx is clear.   Eyes:      General: No scleral icterus.     Extraocular Movements: Extraocular movements intact.      Conjunctiva/sclera: Conjunctivae normal.   Cardiovascular:      Rate and Rhythm: Normal rate and regular rhythm.      Heart sounds: No murmur heard.     Comments: Palpable upper and lower extremity pulses. No obvious swelling.  Pulmonary:      Effort: No respiratory distress.      Breath sounds: No rales.   Abdominal:      General: There is no distension.      Palpations: There is no mass.      Tenderness: There is no abdominal tenderness. There is no guarding.   Musculoskeletal:      Cervical back: No rigidity or tenderness.   Lymphadenopathy:      Cervical: No cervical adenopathy.   Skin:     Coloration: Skin is not jaundiced.      Findings: No rash.   Neurological:      General: No focal deficit present.      Mental Status: She is alert and oriented to person, place, and time.      Cranial Nerves: No cranial nerve deficit.   Psychiatric:         Mood and Affect: Mood normal.       Assessment:  1. Arm swelling    2. Leg swelling      Plan:  Both arterial and venous testing have been normal.  There is no specific vascular surgery contraindication to testosterone therapy.  She can follow-up with me as needed.    Electronically signed by:  Alfonso Ike, MD

## 2023-12-16 ENCOUNTER — Encounter: Payer: PRIVATE HEALTH INSURANCE | Primary: Nurse Practitioner

## 2023-12-16 ENCOUNTER — Encounter: Payer: PRIVATE HEALTH INSURANCE | Attending: Internal Medicine | Primary: Nurse Practitioner

## 2023-12-22 ENCOUNTER — Ambulatory Visit: Admit: 2023-12-22 | Discharge: 2023-12-22 | Payer: PRIVATE HEALTH INSURANCE | Primary: Nurse Practitioner

## 2023-12-22 VITALS — BP 120/62 | HR 63 | Temp 97.70000°F

## 2023-12-22 DIAGNOSIS — R6882 Decreased libido: Secondary | ICD-10-CM

## 2023-12-22 MED ORDER — TESTOSTERONE CYPIONATE 200 MG/ML IM SOLN
200 | Freq: Once | INTRAMUSCULAR | 0 refills | 35.00000 days | Status: DC
Start: 2023-12-22 — End: 2024-03-22

## 2023-12-22 NOTE — Progress Notes (Signed)
 [facility], [facility]  [facility] [place]. [facility]   [address] [place] [address]  Dept: [phone]  Date: 12/22/2023  Patient Name: Monica Wood    VISIT - FOLLOW UP VISIT     CC: had concerns including Injections (No concerns/).    Chaperone present for entire visit and pertinent physical exam:Resident    HPI: Patient to get ok from vascular Dr. For testosterone. AV testing normal - ok for HRT. On Estrogen as well. Recommended taking a baby ASA.  History of Present Illness         Overall state of well-being, dietary and nutritional habits, exercise routines of at least 30 minutes 3 times a week, bowel and bladder function, smoking history, HRT history, sexual activity and partner/s, dyspareunia, and immunizations all revisited if necessary by chart review or direct questioning.    Topics of Prolapse, Pain, Pressure were revisited including type, period of onset, level of severity, quality and quantity, associations, trends, exacerbators, alleviators, bleeding, prolapse to reduce, splinting, and prior treatment / surgery.    Topics of Urinary Leakage were revisited including type, period of onset, level of severity, quality and quantity, associations, trends, exacerbators, alleviators, urgency, frequency, nocturia ,urge incontinence / stress incontinence triggers, hesitancy, obstruction with need to catheterize, frequent UTIs >3 in a year diagnosed by culture, hematuria (gross or microscopic), urinary stones, and prior treatment / surgery.    Topics of Fecal Incontinence were revisited including type, period of onset, level of severity, quality and quantity, associations, trends, exacerbators, alleviators, times per day/week, stool quality / quantity, rectal bleeding, hemorrhoids, constipation / Anismus / Proctalgia fugax, laxative abuse, and prior treatment / surgery.    Topics of General Gynecology were revisited including  gravity/parity, #SVD's, perineal delivery trauma, LMP, days of flow, heaviest days, # pads / tampons per day, cramps, anemia, discharge, prior STI's.    Flowsheet:  Screenings reviewed per Flowsheet including Pap smear, mammogram, dexascan, colonoscopy. Any screenings due will be ordered for this visit if patient agrees.      ROS:  Review of Systems  Low libido  All other systems negative.    PHYSICAL EXAM:  Physical Exam  Constitutional:       Appearance: Normal appearance. She is normal weight.   HENT:      Head: Normocephalic and atraumatic.      Right Ear: Tympanic membrane normal.      Left Ear: Tympanic membrane normal.      Nose: Nose normal.      Mouth/Throat:      Mouth: Mucous membranes are moist.      Pharynx: Oropharynx is clear.   Eyes:      Extraocular Movements: Extraocular movements intact.      Conjunctiva/sclera: Conjunctivae normal.   Cardiovascular:      Rate and Rhythm: Normal rate and regular rhythm.   Pulmonary:      Effort: Pulmonary effort is normal.      Breath sounds: Normal breath sounds.   Abdominal:      General: Abdomen is flat. Bowel sounds are normal.   Musculoskeletal:         General: Normal range of motion.      Cervical back: Normal range of motion and neck supple.   Neurological:      Mental Status: She is oriented to person, place, and time.   Psychiatric:         Mood  and Affect: Mood normal.         Behavior: Behavior normal.   Vitals and nursing note reviewed.       Objective   Blood pressure 120/62, pulse 63, temperature 97.7 F (36.5 C), temperature source Oral, SpO2 95%.  Physical Exam     Good respiratory effort no Homans distal pulses 2 over 4 x 4 no neurologic deficits.  Testosterone injection given in the right buttock IM.    VISIT RESULTS:  No results found for any visits on 12/22/23.     ASSESSMENT/PLAN:  Low libido  -     testosterone cypionate (DEPOTESTOTERONE CYPIONATE) 200 MG/ML injection; Inject 1 mL into the muscle once for 1 dose. Max Daily Amount: 200 mg,  Disp-1 mL, R-0Normal       Orders Placed This Encounter   Medications    testosterone cypionate (DEPOTESTOTERONE CYPIONATE) 200 MG/ML injection     Sig: Inject 1 mL into the muscle once for 1 dose. Max Daily Amount: 200 mg     Dispense:  1 mL     Refill:  0        The patient was counseled regarding review of all conditions discussed.       Assessment & Plan      Multiple records reviewed. All questions were addressed to the patient's satisfaction.      Additional counseling time (minutes) spent regarding today's visit:  15 Performing: ACVISITTIMEPERFORM: Pre-visit chart review and note construction and Covering additional health topics on top of those listed in the chief complaint      Point of care: The treating physician provided 100% of care and consultation for this encounter.    Follow up: No follow-ups on file.     The patient (or guardian, if applicable) and other individuals in attendance with the patient were advised that Artificial Intelligence will be utilized during this visit to record, process the conversation to generate a clinical note and to support improvement of the AI technology. The patient (or guardian, if applicable) and other individuals in attendance at the appointment consented to the use of AI, including the recording.      An electronic signature was used to authenticate this note.    --Raynette Cam, DO

## 2023-12-25 ENCOUNTER — Ambulatory Visit
Admit: 2023-12-25 | Discharge: 2023-12-25 | Payer: PRIVATE HEALTH INSURANCE | Attending: Internal Medicine | Primary: Nurse Practitioner

## 2023-12-25 ENCOUNTER — Inpatient Hospital Stay: Payer: PRIVATE HEALTH INSURANCE | Primary: Nurse Practitioner

## 2023-12-25 VITALS — BP 117/71 | HR 61 | Temp 97.60000°F | Resp 18 | Wt 219.0 lb

## 2023-12-25 DIAGNOSIS — D3A019 Benign carcinoid tumor of the small intestine, unspecified portion: Secondary | ICD-10-CM

## 2023-12-25 DIAGNOSIS — C7A011 Malignant carcinoid tumor of the jejunum: Secondary | ICD-10-CM

## 2023-12-25 LAB — CBC WITH AUTO DIFFERENTIAL
Basophils %: 1 % (ref 0–2)
Basophils Absolute: 0 10*3/uL (ref 0.0–0.2)
Eosinophils %: 2 % (ref 1–4)
Eosinophils Absolute: 0.1 10*3/uL (ref 0.0–0.4)
Hematocrit: 37.5 % (ref 36–46)
Hemoglobin: 11.5 g/dL — ABNORMAL LOW (ref 12.0–16.0)
Lymphocytes %: 38 % (ref 24–44)
Lymphocytes Absolute: 2.4 10*3/uL (ref 1.0–4.8)
MCH: 22.5 pg — ABNORMAL LOW (ref 26–34)
MCHC: 30.6 g/dL — ABNORMAL LOW (ref 31–37)
MCV: 73.5 fL — ABNORMAL LOW (ref 80–100)
MPV: 7.1 fL (ref 6.0–12.0)
Monocytes %: 9 % (ref 2–11)
Monocytes Absolute: 0.6 10*3/uL (ref 0.1–1.2)
Neutrophils %: 50 % (ref 36–66)
Neutrophils Absolute: 3.2 10*3/uL (ref 1.8–7.7)
Platelets: 348 10*3/uL (ref 140–450)
RBC: 5.1 m/uL (ref 4.0–5.2)
RDW: 14.8 % (ref 12.5–15.4)
WBC: 6.3 10*3/uL (ref 3.5–11.0)

## 2023-12-25 LAB — COMPREHENSIVE METABOLIC PANEL
ALT: 48 U/L — ABNORMAL HIGH (ref 10–35)
AST: 29 U/L (ref 10–35)
Albumin/Globulin Ratio: 1.4 (ref 1.0–2.5)
Albumin: 4 g/dL (ref 3.5–5.2)
Alkaline Phosphatase: 142 U/L — ABNORMAL HIGH (ref 35–104)
Anion Gap: 11 mmol/L (ref 9–16)
BUN: 11 mg/dL (ref 6–20)
CO2: 23 mmol/L (ref 20–31)
Calcium: 9.6 mg/dL (ref 8.6–10.4)
Chloride: 104 mmol/L (ref 98–107)
Creatinine: 0.9 mg/dL (ref 0.6–0.9)
Est, Glom Filt Rate: 79 mL/min/{1.73_m2} (ref >60)
Glucose: 108 mg/dL — ABNORMAL HIGH (ref 74–99)
Potassium: 4.2 mmol/L (ref 3.7–5.3)
Sodium: 138 mmol/L (ref 136–145)
Total Bilirubin: 0.4 mg/dL (ref 0.0–1.2)
Total Protein: 6.9 g/dL (ref 6.6–8.7)

## 2023-12-25 NOTE — Progress Notes (Signed)
 Monica Wood                                                                                                                  12/25/2023  MRN:   [id number]  Date of Birth:  Mar 12, 1976  PCP:                           Cheyenne Raisin, APRN - CNP  Referring Physician: No ref. 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 Diarrhea improved . Tolerating octreotide . Labs show microcytic anemia . Iron stores are adequate. Still has horseness of voice. Pt taking po iron     FAMILY HISTORY  She reports no family history of sickle cell or thalassemia.    During this visit patient's allergy, social, medical, surgical history and medications were reviewed and updated.    Summary of Case/History:  Shakisha Abend a 48 y.o.female is a patient with unresectable well-differentiated endocrine tumor of small bowel presents to the clinic to establish care for further workup and management.  Patient complains of having abdominal pain .  Presented to the ER underwent imaging which showed soft tissue mass in the ventral mesentery anterior to the aorta concerning for neoplasm.  Patient underwent EGD and colonoscopy by GI which showed a 1 cm polyp in the colon but no other significant abnormality.  Subsequently patient was taken to the OR for exploratory laparotomy small bowel resection and lymph node dissection.  Pathology from the surgery showed multiple well-differentiated neuroendocrine tumors largest measuring 2.8  cm in the mid small bowel resection.  There was also metastasis noted to the lymph nodes.  Patient had a grade 1 disease.  Ki-67 was 1-2%.  Pathological stage was T3 N1.  Patient is recovering from the surgery.  She has a PET scan scheduled next week.  Presents to the clinic to establish care and for further workup.  Patient also noted to have microcytic anemia.  Iron studies done earlier show adequate iron stores.  Patient also complains of hoarseness of voice which has been going on for a while.    Past Medical History:   Past Medical History:   Diagnosis Date    Anemia     Anxiety and depression     Arthritis     Breast pain     Bursitis  Carcinoid tumor (HCC)     Chronic diarrhea     Depression 2020    Digestive problems     Female genital prolapse, unspecified     Fibromyalgia     Irritable bowel syndrome 2001    Obesity 1985    Osteoarthritis 2019    Retention of urine     Scoliosis     STD (sexually transmitted disease)     Chlamydia in college    Tendinitis     Tension headache     Urinary incontinence     Vitamin D deficiency     Vitamin D deficiency      Past Surgical History:  Past Surgical History:   Procedure Laterality Date    ABDOMINOPLASTY      COLONOSCOPY N/A 11/18/2022    COLONOSCOPY POLYPECTOMY SNARE BIOPSY performed by Agapito Heman, MD at [facility] [place] [facility]    ENDOMETRIAL ABLATION  2008    ESOPHAGOGASTRIC FUNDOPLICATION  2000    GALLBLADDER SURGERY      LAPAROTOMY N/A 11/19/2022    LAPAROTOMY EXPLORATORY.  SMALL BOWEL RESECTION.  INCISIONAL BIOPSY OF MESENTERIC LYMPH NODE performed by Teresa Landry LABOR, MD at [facility] OR    OTHER SURGICAL HISTORY      ablation    TUBAL LIGATION      UPPER GASTROINTESTINAL ENDOSCOPY N/A 11/18/2022    ESOPHAGOGASTRODUODENOSCOPY BIOPSY performed by Agapito Heman, MD at [facility] [place] [facility]     Patient Family Social History:  Family History   Problem Relation Age of Onset    Diabetes Maternal Grandmother     Diabetes Father     Diabetes Mother     Allergy (Severe)  Mother     Anemia Mother     Arthritis Mother     Depression Mother     Other Mother         ADHD    Breast Cancer Neg Hx     Ovarian Cancer Neg Hx     Endometrial Cancer Neg Hx     Uterine Cancer Neg Hx     Cervical Cancer Neg Hx     Vaginal Cancer Neg Hx     Colon Cancer Neg Hx      Social History     Tobacco Use    Smoking status: Never    Smokeless tobacco: Never   Vaping Use    Vaping status: Never Used   Substance Use Topics    Alcohol use: Not Currently     Alcohol/week: 4.0 standard drinks of alcohol     Types: 4 Glasses of wine per week     Comment: 3/4 times a week    Drug use: Not Currently     Comment: uses gummies periodically for pain control     Current Medications:  Current Outpatient Medications   Medication Sig Dispense Refill    progesterone  (PROMETRIUM ) 100 MG CAPS capsule Take 1 capsule by mouth nightly 30 capsule 3    estradiol  (VIVELLE -DOT) 0.0375 MG/24HR Place 1 patch onto the skin Twice a Week 8 patch 3    famotidine  (PEPCID ) 40 MG tablet Take 1 tablet by mouth at bedtime 90 tablet 3    lipase-protease-amylase (CREON ) 36000-114000 units CPEP delayed release capsule 1 capsule TID with meals 270 capsule 11    albuterol  sulfate HFA (PROVENTIL  HFA) 108 (90 Base) MCG/ACT inhaler Inhale 2 puffs into the lungs every 6 hours as needed for Wheezing or Shortness of Breath 18 g 2  buPROPion  (WELLBUTRIN  XL) 150 MG extended release tablet Take 1 tablet by mouth every morning 90 tablet 1    DULoxetine  (CYMBALTA ) 60 MG extended release capsule Take 1 capsule by mouth daily 90 capsule 1    PREBIOTIC PRODUCT PO Take by mouth      Calcium Carbonate-Vitamin D (OYSTER SHELL CALCIUM/D) 500-5 MG-MCG TABS Take 1 tablet by mouth      vitamin E 400 UNIT capsule Take 1 capsule by mouth daily      Cholecalciferol (VITAMIN D3) 1.25 MG (50000 UT) CAPS Take by mouth      ferrous sulfate  (IRON 325) 325 (65 Fe) MG tablet Take 1 tablet by mouth daily (with breakfast) 90 tablet 1    Lisdexamfetamine Dimesylate 40 MG CAPS  Take 40 mg by mouth daily.      tiZANidine  (ZANAFLEX ) 2 MG tablet Take 1 tablet by mouth daily as needed (back muscle) 30 tablet 1    testosterone  cypionate (DEPOTESTOTERONE CYPIONATE) 200 MG/ML injection Inject 1 mL into the muscle once for 1 dose. Max Daily Amount: 200 mg 1 mL 0     No current facility-administered medications for this visit.     Allergies:   Oxycodone, Parsley leaves, Percocet [oxycodone-acetaminophen ], Codeine, and Morphine     Review of Systems:    Constitutional: No fever or chills. No night sweats, no weight loss   Eyes: No eye discharge, double vision, or eye pain   HEENT: negative for sore mouth, sore throat, hoarseness and voice change   Respiratory: negative for cough , sputum, dyspnea, wheezing, hemoptysis, chest pain   Cardiovascular: negative for chest pain, dyspnea, palpitations, orthopnea, PND   Gastrointestinal: negative for nausea, vomiting, constipation, abdominal pain, Dysphagia, hematemesis and hematochezia.  Positive for abdominal discomfort stable diarrhea improved  Genitourinary: negative for frequency, dysuria, nocturia, urinary incontinence, and hematuria   Integument: negative for rash, skin lesions, bruises.   Hematologic/Lymphatic: negative for easy bruising, bleeding, lymphadenopathy, or petechiae   Endocrine: negative for heat or cold intolerance,weight changes, change in bowel habits and hair loss   Musculoskeletal: negative for myalgias, arthralgias, pain, joint swelling,and bone pain   Neurological: negative for headaches, dizziness, seizures, weakness, numbness    Physical Exam:  Vitals: BP 117/71   Pulse 61   Temp 97.6 F (36.4 C) (Temporal)   Resp 18   Wt 99.3 kg (219 lb)   SpO2 98%   BMI 37.59 kg/m   General appearance - well appearing, no in pain or distress  Mental status - AAO X3  Eyes - pupils equal and reactive, extraocular eye movements intact  Mouth - mucous membranes moist, pharynx normal without lesions  Neck - supple, no significant  adenopathy  Lymphatics - no palpable lymphadenopathy, no hepatosplenomegaly  Chest - clear to auscultation, no wheezes, rales or rhonchi, symmetric air entry  Heart - normal rate, regular rhythm, normal S1, S2, no murmurs  Abdomen - soft, nontender, nondistended, no masses or organomegaly.  Positive healing surgical incision site  Neurological - alert, oriented, normal speech, no focal findings or movement disorder noted  Extremities - peripheral pulses normal, no pedal edema, no clubbing or cyanosis  Skin - normal coloration and turgor, no rashes, no suspicious skin lesions noted       DATA:  Results for orders placed or performed during the hospital encounter of 12/25/23   CBC with Auto Differential   Result Value Ref Range    WBC 6.3 3.5 - 11.0 k/uL    RBC 5.10 4.0 -  5.2 m/uL    Hemoglobin 11.5 (L) 12.0 - 16.0 g/dL    Hematocrit 62.4 36 - 46 %    MCV 73.5 (L) 80 - 100 fL    MCH 22.5 (L) 26 - 34 pg    MCHC 30.6 (L) 31 - 37 g/dL    RDW 85.1 87.4 - 84.5 %    Platelets 348 140 - 450 k/uL    MPV 7.1 6.0 - 12.0 fL    Neutrophils % 50 36 - 66 %    Lymphocytes % 38 24 - 44 %    Monocytes % 9 2 - 11 %    Eosinophils % 2 1 - 4 %    Basophils % 1 0 - 2 %    Neutrophils Absolute 3.20 1.8 - 7.7 k/uL    Lymphocytes Absolute 2.40 1.0 - 4.8 k/uL    Monocytes Absolute 0.60 0.1 - 1.2 k/uL    Eosinophils Absolute 0.10 0.0 - 0.4 k/uL    Basophils Absolute 0.00 0.0 - 0.2 k/uL   Comprehensive Metabolic Panel   Result Value Ref Range    Sodium 138 136 - 145 mmol/L    Potassium 4.2 3.7 - 5.3 mmol/L    Chloride 104 98 - 107 mmol/L    CO2 23 20 - 31 mmol/L    Anion Gap 11 9 - 16 mmol/L    Glucose 108 (H) 74 - 99 mg/dL    BUN 11 6 - 20 mg/dL    Creatinine 0.9 0.6 - 0.9 mg/dL    Est, Glom Filt Rate 79 >60 mL/min/1.39m2    Calcium 9.6 8.6 - 10.4 mg/dL    Total Protein 6.9 6.6 - 8.7 g/dL    Albumin 4.0 3.5 - 5.2 g/dL    Albumin/Globulin Ratio 1.4 1.0 - 2.5    Total Bilirubin 0.4 0.0 - 1.2 mg/dL    Alkaline Phosphatase 142 (H) 35 - 104 U/L     ALT 48 (H) 10 - 35 U/L    AST 29 10 - 35 U/L     Vascular duplex upper extremity venous bilateral  Result Date: 12/01/2023    No evidence of acute deep vein thrombosis in the right upper extremity.   No evidence of acute deep vein thrombosis in the left upper extremity.     Vascular duplex lower extremity venous bilateral  Result Date: 12/01/2023    No evidence of deep vein thrombosis in the right lower extremity.   No evidence of deep vein thrombosis in the left lower extremity.         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  Reviewed records from outside facility.  Continue octreotide .   Pt seen at [facility] , disease unresectable. Continue octreotide .  Patient is currently on octreotide  30 mg every 4 weeks.  Discussed natural history of well-differentiated neuroendocrine tumor.  Patient feeling much better with HRT  Will need surveillance for IPMN.  Will repeat MRI in 1 year, 06/02/2024  Continue oral iron supplementation. Still microcytic anemia . 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  HRT will not interfere treatment with patient's neuroendocrine tumor.  Okay to take from oncology standpoint  Continue exercises  for the vocal cord for voice changes  NCCN guidelines were reviewed and discussed with the patient.  Hemoglobin electrophoresis unremarkable however alpha thalassemia not ruled out  The diagnosis and care plan were discussed with the patient in detail. I discussed the natural history of the disease, prognosis, risks and goals of therapy and answered all the patients questions to the best of my ability.  Patient expressed understanding and was in  agreement.        LIVIA PENCE, MD    This note is created with the assistance of a speech recognition program.  While intending to generate a document that actually reflects the content of the visit, the document can still have some errors including those of syntax and sound a like substitutions which may escape proof reading.  It such instances, actual meaning can be extrapolated by contextual diversion.

## 2023-12-25 NOTE — Patient Instructions (Signed)
 Rv in 2 months with octreotide  shot

## 2023-12-25 NOTE — Telephone Encounter (Signed)
 Instructions   from Dr. Alvina Axon, MD    Rv in 2 months with octreotide  shot          RV 02/12/24 @11  am with injection to follow.

## 2023-12-26 LAB — IRON AND TIBC
Iron % Saturation: 31 % (ref 20–55)
Iron: 109 ug/dL (ref 37–145)
TIBC: 350 ug/dL (ref 250–450)
UIBC: 241 ug/dL (ref 112–347)

## 2023-12-26 LAB — FERRITIN: Ferritin: 144 ng/mL (ref 15–150)

## 2024-01-12 ENCOUNTER — Inpatient Hospital Stay: Admit: 2024-01-12 | Discharge: 2024-01-12 | Payer: PRIVATE HEALTH INSURANCE | Primary: Nurse Practitioner

## 2024-01-12 VITALS — BP 96/64 | HR 81

## 2024-01-12 DIAGNOSIS — D3A019 Benign carcinoid tumor of the small intestine, unspecified portion: Principal | ICD-10-CM

## 2024-01-12 MED ORDER — OCTREOTIDE ACETATE 30 MG IM KIT
30 | Freq: Once | INTRAMUSCULAR | Status: AC
Start: 2024-01-12 — End: 2024-01-12
  Administered 2024-01-12: 17:00:00 30 mg via INTRAMUSCULAR

## 2024-01-12 MED FILL — FAMOTIDINE 40MG TABS: 40 40 MG | ORAL | 30 days supply | Qty: 30 | Fill #1 | Status: AC

## 2024-01-12 MED FILL — SANDOSTATIN LAR DEPOT 30 MG IM KIT: 30 mg | INTRAMUSCULAR | Qty: 1

## 2024-01-12 NOTE — Progress Notes (Signed)
 Patient here sandostatin  injection  Arrives ambulatory.  Denies any new complaints.  Injection given without incident  Patient discharged in stable condition.  Returns 02/12/24 for MD visit and sandostatin .

## 2024-01-20 ENCOUNTER — Ambulatory Visit: Admit: 2024-01-20 | Discharge: 2024-01-20 | Payer: PRIVATE HEALTH INSURANCE | Primary: Nurse Practitioner

## 2024-01-20 ENCOUNTER — Ambulatory Visit
Admit: 2024-01-20 | Discharge: 2024-01-20 | Payer: PRIVATE HEALTH INSURANCE | Attending: Physician Assistant | Primary: Nurse Practitioner

## 2024-01-20 VITALS — Resp 14 | Ht 64.02 in | Wt 219.0 lb

## 2024-01-20 DIAGNOSIS — M7062 Trochanteric bursitis, left hip: Principal | ICD-10-CM

## 2024-01-20 MED ORDER — CYCLOBENZAPRINE HCL 10 MG PO TABS
10 | ORAL_TABLET | Freq: Every evening | ORAL | 0 refills | 13.00000 days | Status: DC | PRN
Start: 2024-01-20 — End: 2024-08-01

## 2024-01-20 MED ORDER — METHYLPREDNISOLONE 4 MG PO TBPK
4 | PACK | ORAL | 0 refills | Status: AC
Start: 2024-01-20 — End: 2024-01-26

## 2024-01-20 NOTE — Progress Notes (Signed)
 [name]  [address]  [place] [address]  Dept Phone: [phone]  Dept Fax: [phone]      [facility] Follow Up    Subjective:       Monica Wood is a 48 y.o. year old female who presents to the clinic today for follow up chronic bilateral hip pain and worsening low back pain.  Patient has been seen in the past for bilateral trochanteric bursitis and actually underwent injections on 11/25/2023.  She reports that this did seem to help significantly with her lateral hip pain but she continues to note posterior hip and midline low back pain that has minimally improved despite home exercises and stretches.     She states she has occasional radiation pain down the posterior thigh with intermittent numbness and tingling.  She has been utilizing the occasional ibuprofen but states that she has to use caution with NSAIDs due to GI history.    She feels like this increasing pain is causing her leg to externally rotate.  Especially when she is more fatigued or when the pain is more severe.    Review of Systems  Gen: no fever, chills, malaise  CV: no chest pain or palpitations  Resp: no cough or shortness of breath  GI: no nausea, vomiting, diarrhea, or constipation  Neuro: no seizures, vertigo, or headache  Msk: See HPI  10 remaining systems reviewed and negative    Objective :     Vitals:    01/20/24 1134   Resp: 14   Body mass index is 37.57 kg/m.  General: No acute distress, resting comfortably in the clinic  Neuro: alert. oriented  Eyes: Extra-ocular muscles intact  Pulm: Respirations unlabored and regular.  Skin: warm, well perfused  Psych:   Patient has good fund of knowledge and displays understanding of exam, diagnosis, and plan.  LUMBAR/SACRAL EXAMINATION:  Inspection: Local inspection shows no step-off or bruising.  Lumbar alignment is normal.  Sagittal and Coronal balance is neutral.      Palpation: Mild tenderness to bilateral SI joint. No significant  midline tenderness.  Mild tenderness over the bilateral greater trochanter region.  No significant paraspinal muscular tenderness..  There is no step-off or paraspinal spasm.   Range of Motion: Lumbar flexion, extension and rotation are mildly limited due to pain.  Strength:   Strength testing is 5/5 in all muscle groups tested.   Special Tests:   Straight leg raise and crossed SLR positive on the left at 30 degrees..  Leg length and pelvis level.  0 out of 5 Waddell's signs.        Skin: There are no rashes, ulcerations or lesions.  Reflexes: Reflexes are symmetrically 2+ at the patellar and ankle tendons.  Clonus absent bilaterally at the feet.  Gait & station: Antalgic, patient ambulates without assistance.  Additional Examinations:   RIGHT LOWER EXTREMITY: Inspection/examination of the right lower extremity does not show any tenderness, deformity or injury. Range of motion is unremarkable. There is no gross instability.  There are no rashes, ulcerations or lesions. Strength and tone are normal.  LEFT LOWER EXTREMITY:  Inspection/examination of the left lower extremity does not show any tenderness, deformity or injury. Range of motion is unremarkable. There is no gross instability. There are no rashes, ulcerations or lesions.  Strength and tone are normal.      Radiology:  Imaging studies from today were independently reviewed and read as listed below. Any relevant images obtained  prior to today's visit were also independently interpreted.        History: 48 year old female with chronic bilateral hip pain    Comparison: 12/25/2022    Findings: 2 views of the bilateral hips (AP pelvis and lateral) demonstrate pincer osteophytes bilaterally with questionable femoral acetabular impingement.  Otherwise joint space overall well-maintained without evidence of significant joint space narrowing, acute fracture, subluxation or dislocation.    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 year old female chronic low back pain and chronic bilateral hip pain consistent with trochanteric bursitis  Plan:   Lengthy discussion had with patient about current clinical state.   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  While intending to generate a document that actually reflects the content of the visit, the document can still have some errors including those of syntax and sound a like substitutions which may escape proof reading.  In such instances, actual meaning can be extrapolated by contextual diversion

## 2024-01-22 NOTE — Telephone Encounter (Signed)
 Would advise follow-up with podiatry/foot and ankle specialist for evaluation and they can provide prescription for orthotic inserts if they see fit

## 2024-01-28 ENCOUNTER — Ambulatory Visit
Admit: 2024-01-28 | Discharge: 2024-01-28 | Payer: PRIVATE HEALTH INSURANCE | Attending: Nurse Practitioner | Primary: Nurse Practitioner

## 2024-01-28 VITALS — BP 92/66 | HR 67 | Wt 214.8 lb

## 2024-01-28 DIAGNOSIS — D3A019 Benign carcinoid tumor of the small intestine, unspecified portion: Principal | ICD-10-CM

## 2024-01-28 NOTE — Progress Notes (Signed)
 [facility]  [facility] HEALTH [facility] PRIMARY CARE  [address]  [address]  [place] [address]  Dept: [phone]  Dept Fax: [phone]    Monica Wood is a 48 y.o. female who presentstoday for her medical conditions/complaints as noted below.  Monica Wood is c/o of  Chief Complaint   Patient presents with    Follow-up     6 months follow-up         HPI:     History of Present Illness  The patient presents for 6 month evaluation of cancer, hormone replacement therapy, anemia, hip and back pain, and health maintenance.    She has been diagnosed with cancer, neuroendocrine tumor, and is currently receiving monthly injections of octreotide  to prevent tumor growth. The tumor's proximity to her artery makes surgical removal risky. A consultation at the [facility] revealed that removing the tumor would necessitate the removal of a significant portion of her small intestine and part of her colon, leading to short gut syndrome and potential malnutrition. She is scheduled for an MRI in 05/2024.    She is also undergoing hormone replacement therapy, which includes progesterone , estrogen, and testosterone , and reports feeling better as a result.    She is under the care of Darlyn Stager for hip and back issues. She was prescribed steroids and muscle relaxers, which she found beneficial. She has resumed her back exercises and sought help from the clinic to realign her hips. She is also working on strengthening her quadriceps.    She is taking iron supplements for anemia.    She has not had her cholesterol levels checked recently.      Hemoglobin A1C (%)   Date Value   04/18/2020 5.5             ( goal A1C is < 7)   No components found for: LABMICR  No components found for: LDLCHOLESTEROL, LDLCALC    (goal LDL is <100)   AST (U/L)   Date Value   12/25/2023 29     ALT (U/L)   Date Value   12/25/2023 48 (H)     BUN (mg/dL)   Date Value   93/86/7974 11     BP Readings from Last 3 Encounters:    01/28/24 92/66   01/12/24 96/64   12/25/23 117/71          (goal120/80)    Past Medical History:   Diagnosis Date    Anemia     Anxiety and depression     Arthritis     Breast pain     Bursitis     Carcinoid tumor (HCC)     Chronic diarrhea     Depression 2020    Digestive problems     Female genital prolapse, unspecified     Fibromyalgia     Irritable bowel syndrome 2001    Obesity 1985    Osteoarthritis 2019    Retention of urine     Scoliosis     STD (sexually transmitted disease)     Chlamydia in college    Tendinitis     Tension headache     Urinary incontinence     Vitamin D deficiency     Vitamin D deficiency       Past Surgical History:   Procedure Laterality Date    ABDOMINOPLASTY      COLONOSCOPY N/A 11/18/2022    COLONOSCOPY POLYPECTOMY SNARE BIOPSY performed by Agapito Heman, MD at [facility]  OR    ENDOMETRIAL ABLATION  2008    ESOPHAGOGASTRIC FUNDOPLICATION  2000    GALLBLADDER SURGERY      LAPAROTOMY N/A 11/19/2022    LAPAROTOMY EXPLORATORY.  SMALL BOWEL RESECTION.  INCISIONAL BIOPSY OF MESENTERIC LYMPH NODE performed by Teresa Landry LABOR, MD at [facility] OR    OTHER SURGICAL HISTORY      ablation    TUBAL LIGATION      UPPER GASTROINTESTINAL ENDOSCOPY N/A 11/18/2022    ESOPHAGOGASTRODUODENOSCOPY BIOPSY performed by Agapito Heman, MD at [facility] [place] OR       Family History   Problem Relation Age of Onset    Diabetes Maternal Grandmother     Diabetes Father     Diabetes Mother     Allergy (Severe) Mother     Anemia Mother     Arthritis Mother     Depression Mother     Other Mother         ADHD    Breast Cancer Neg Hx     Ovarian Cancer Neg Hx     Endometrial Cancer Neg Hx     Uterine Cancer Neg Hx     Cervical Cancer Neg Hx     Vaginal Cancer Neg Hx     Colon Cancer Neg Hx        Social History     Tobacco Use    Smoking status: Never    Smokeless tobacco: Never   Substance Use Topics    Alcohol use: Not Currently     Alcohol/week: 4.0 standard drinks of alcohol     Types: 4 Glasses of wine  per week     Comment: 3/4 times a week      Current Outpatient Medications   Medication Sig Dispense Refill    cyclobenzaprine  (FLEXERIL ) 10 MG tablet Take 1 tablet by mouth nightly as needed for Muscle spasms 10 tablet 0    progesterone  (PROMETRIUM ) 100 MG CAPS capsule Take 1 capsule by mouth nightly 30 capsule 3    estradiol  (VIVELLE -DOT) 0.0375 MG/24HR Place 1 patch onto the skin Twice a Week 8 patch 3    famotidine  (PEPCID ) 40 MG tablet Take 1 tablet by mouth at bedtime 90 tablet 3    lipase-protease-amylase (CREON ) 36000-114000 units CPEP delayed release capsule 1 capsule TID with meals 270 capsule 11    albuterol  sulfate HFA (PROVENTIL  HFA) 108 (90 Base) MCG/ACT inhaler Inhale 2 puffs into the lungs every 6 hours as needed for Wheezing or Shortness of Breath 18 g 2    buPROPion  (WELLBUTRIN  XL) 150 MG extended release tablet Take 1 tablet by mouth every morning 90 tablet 1    DULoxetine  (CYMBALTA ) 60 MG extended release capsule Take 1 capsule by mouth daily 90 capsule 1    PREBIOTIC PRODUCT PO Take by mouth      Calcium Carbonate-Vitamin D (OYSTER SHELL CALCIUM/D) 500-5 MG-MCG TABS Take 1 tablet by mouth      vitamin E 400 UNIT capsule Take 1 capsule by mouth daily      Cholecalciferol (VITAMIN D3) 1.25 MG (50000 UT) CAPS Take by mouth      ferrous sulfate  (IRON 325) 325 (65 Fe) MG tablet Take 1 tablet by mouth daily (with breakfast) 90 tablet 1    Lisdexamfetamine Dimesylate 40 MG CAPS Take 40 mg by mouth daily.      tiZANidine  (ZANAFLEX ) 2 MG tablet Take 1 tablet by mouth daily as needed (back muscle) 30 tablet 1  testosterone  cypionate (DEPOTESTOTERONE CYPIONATE) 200 MG/ML injection Inject 1 mL into the muscle once for 1 dose. Max Daily Amount: 200 mg 1 mL 0     No current facility-administered medications for this visit.     Allergies   Allergen Reactions    Oxycodone Anaphylaxis    Parsley Leaves Anaphylaxis, Hives, Itching and Shortness Of Breath    Percocet [Oxycodone-Acetaminophen ] Shortness Of Breath     Codeine      Anaphylaxis     Morphine         Health Maintenance   Topic Date Due    DTaP/Tdap/Td vaccine (1 - Tdap) Never done    COVID-19 Vaccine (1 - 2024-25 season) Never done    Diabetes screen  04/19/2023    Hepatitis B vaccine (2 of 3 - 19+ 3-dose series) 06/03/2023    Flu vaccine (1) 02/12/2024    Depression Monitoring  07/29/2024    Breast cancer screen  09/24/2025    Lipids  11/06/2025    Cervical cancer screen  11/04/2028    Colorectal Cancer Screen  11/17/2032    Hepatitis A vaccine  Aged Out    Hib vaccine  Aged Out    Polio vaccine  Aged Out    Meningococcal (ACWY) vaccine  Aged Out    Meningococcal B vaccine  Aged Out    Pneumococcal 0-49 years Vaccine  Aged Out    Depression Screen  Discontinued    Hepatitis C screen  Discontinued    HIV screen  Discontinued       Subjective:      Review of Systems   Constitutional:  Negative for chills, fatigue and fever.   HENT:  Negative for ear discharge, ear pain, sinus pressure, sinus pain, sore throat and trouble swallowing.    Eyes:  Negative for discharge, redness and itching.   Respiratory:  Negative for cough, chest tightness, shortness of breath and wheezing.    Cardiovascular:  Negative for chest pain.   Gastrointestinal:  Negative for abdominal pain, diarrhea, nausea and vomiting.   Genitourinary:  Negative for difficulty urinating.   Musculoskeletal:  Negative for arthralgias and neck pain.   Skin:  Negative for rash.   Neurological:  Negative for dizziness, weakness, light-headedness and headaches.   All other systems reviewed and are negative.      Objective:   BP 92/66   Pulse 67   Wt 97.4 kg (214 lb 12.8 oz)   SpO2 97%   BMI 36.85 kg/m     Physical Exam      Physical Exam  Constitutional:       General: She is not in acute distress.     Appearance: Normal appearance. She is normal weight. She is not ill-appearing.   HENT:      Head: Normocephalic and atraumatic.      Nose: Nose normal. No congestion or rhinorrhea.      Mouth/Throat:       Mouth: Mucous membranes are moist.      Pharynx: Oropharynx is clear. No oropharyngeal exudate or posterior oropharyngeal erythema.   Eyes:      Extraocular Movements: Extraocular movements intact.      Conjunctiva/sclera: Conjunctivae normal.      Pupils: Pupils are equal, round, and reactive to light.   Cardiovascular:      Rate and Rhythm: Normal rate and regular rhythm.      Pulses: Normal pulses.      Heart sounds: Normal heart sounds. No murmur heard.  Pulmonary:      Effort: Pulmonary effort is normal. No respiratory distress.      Breath sounds: Normal breath sounds. No wheezing.   Musculoskeletal:         General: Normal range of motion.      Cervical back: Normal range of motion and neck supple.   Skin:     General: Skin is warm and dry.      Capillary Refill: Capillary refill takes less than 2 seconds.   Neurological:      General: No focal deficit present.      Mental Status: She is alert and oriented to person, place, and time.      Motor: No weakness.      Coordination: Coordination normal.      Gait: Gait normal.   Psychiatric:         Mood and Affect: Mood normal.         Behavior: Behavior normal.         Thought Content: Thought content normal.           :       Diagnosis Orders   1. Encounter for lipid screening for cardiovascular disease  Lipid Panel      2. Screening for diabetes mellitus  Hemoglobin A1C      3. 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 Trochanteric bursitis of both hips        6. Osteoarthritis of spine with radiculopathy, lumbar region                  :   Assessment & Plan     Assessment & Plan  1. Cancer.  - Receiving octreotide  injections every 4 weeks to manage the tumor, which is unresectable due to its proximity to a major artery.  - MRI scheduled for 05/2024 to monitor the tumor's status.  - Continues to feel better on hormone replacement therapy.  - No  surgical intervention planned due to potential complications.  -following with hem/onc    2. Hormone replacement therapy.  - On hormone replacement therapy including progesterone , estrogen, and testosterone .  - Reports improvement in symptoms with current hormone regimen.  - Continues current hormone replacement therapy.  - No changes to hormone therapy at this time.    3. Anemia.  - Taking iron supplements for anemia.  - No changes to current treatment plan necessary.  - Monitoring anemia status with regular lab work.  - Continues iron supplementation.    4. Hip and back pain.  - Prescribed steroid pills and muscle relaxer by Darlyn Stager, which have alleviated symptoms.  - Resumed exercises for back and hips.  - No further treatment required at this time.  - Continues to manage symptoms with prescribed medications and exercises.    5. Health maintenance.  - Blood pressure is well-controlled (92/66), and weight has decreased.  - Mammogram last performed in 09/2023.  - Up-to-date with vaccinations.  - Lipid panel and A1c test will be added to next lab work schedule.  - No additional vaccines needed unless specific circumstances arise.    Follow-up  - Follow-up appointment scheduled in 6 months.      Return in about 6 months (around 07/30/2024) for follow up.     Patient given educational materials - see patient instructions.  Discussed  use, benefit, and side effects of prescribed medications.  All patient questions answered. Pt voiced understanding. Reviewed health maintenance.  Instructed to continue current medications, diet and exercise.  Patient agreed with treatment plan. Follow up as directed.     Electronicallysigned by Harlene Pike, APRN - CNP on 01/28/2024 at 8:49 AM    The patient (or guardian, if applicable) and other individuals in attendance with the patient were advised that Artificial Intelligence will be utilized during this visit to record, process the conversation to generate a clinical note, and  support improvement of the AI technology. The patient (or guardian, if applicable) and other individuals in attendance at the appointment consented to the use of AI, including the recording.

## 2024-01-31 NOTE — Other (Signed)
 Please inform the patient regarding the normal pap test cytology, with absent endocervix component. This endocervix component has been absent on most recent pap tests from 2020 and 2023 as well. This is likely due to the patient's history of LEEP (2001).     Next pap test is due at most April 2026. At that time, a focused effort to collect an endocervix sampling should be performed. 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    Thank you for your assistance,  Cresencio Saucier, MD  Gynecologic Oncology

## 2024-02-01 NOTE — Other (Signed)
 Called and informed patient of results and instructions.  Patient voiced understanding and appreciation.

## 2024-02-08 ENCOUNTER — Ambulatory Visit: Admit: 2024-02-08 | Discharge: 2024-02-08 | Payer: PRIVATE HEALTH INSURANCE | Primary: Nurse Practitioner

## 2024-02-08 VITALS — BP 125/69 | HR 93 | Temp 98.80000°F

## 2024-02-08 DIAGNOSIS — R234 Changes in skin texture: Principal | ICD-10-CM

## 2024-02-08 NOTE — Progress Notes (Signed)
 [facility], [facility]  [facility] [place]. [facility]   [address] [place] [address]  Dept: [phone]  Date: 02/08/2024  Patient Name: Monica Wood    VISIT - FOLLOW UP VISIT     CC: had concerns including Other (Office visit(exam) (possible legion) pt brought in testosterone  ).    History of Present Illness    The patient presents for concerns that her left labia is rougher or swollen when compared to the right.    She reports a noticeable difference in the texture and appearance of her left labia compared to the right, describing it as rougher and whiter. A previous CT scan had indicated a lesion in her vagina, but a subsequent examination by her gynecologist oncologist, Dr. Erica did not confirm this finding. Occasional itching is reported, along with an increase in discharge on the left side, which she manages by wearing a liner. She mentions a sensation of roughness on the left side of her labia minora, contrasting with the smoothness on the right side. These symptoms have not been discussed with Dr. Erica. She maintains personal hygiene by washing with soap and water. She will report intermittent left sided pelvic pain.     She continues to receive testosterone  injections from Dr. Delbert, with her general oncologist's approval for hormone replacement therapy.    GYNECOLOGICAL HISTORY:  Gynecology History discussed    PAST GYNECOLOGIC HISTORY:  Cervical dysplasia    PAST SURGICAL HISTORY:  LEEP     Past Surgical History:   Procedure Laterality Date    ABDOMINOPLASTY      COLONOSCOPY N/A 11/18/2022    COLONOSCOPY POLYPECTOMY SNARE BIOPSY performed by Agapito Heman, MD at [facility] [place] OR    ENDOMETRIAL ABLATION  2008    ESOPHAGOGASTRIC FUNDOPLICATION  2000    GALLBLADDER SURGERY      LAPAROTOMY N/A 11/19/2022    LAPAROTOMY EXPLORATORY.  SMALL BOWEL RESECTION.  INCISIONAL BIOPSY OF MESENTERIC LYMPH NODE performed by Teresa Landry LABOR, MD at [facility] OR    OTHER SURGICAL HISTORY      ablation    TUBAL LIGATION      UPPER GASTROINTESTINAL ENDOSCOPY N/A 11/18/2022    ESOPHAGOGASTRODUODENOSCOPY BIOPSY performed by Agapito Heman, MD at [facility] OR      Past Medical History:   Diagnosis Date    Anemia     Anxiety and depression     Arthritis     Breast pain     Bursitis     Carcinoid tumor (HCC)     Chronic diarrhea     Depression 2020    Digestive problems     Female genital prolapse, unspecified     Fibromyalgia     Irritable bowel syndrome 2001    Obesity 1985    Osteoarthritis 2019    Retention of urine     Scoliosis     STD (sexually transmitted disease)     Chlamydia in college    Tendinitis     Tension headache     Urinary incontinence     Vitamin D deficiency     Vitamin D deficiency         Overall state of well-being, dietary and nutritional habits, exercise routines of at least 30 minutes 3 times a week, bowel and bladder function, smoking history, HRT history, sexual activity and partner/s, dyspareunia, and immunizations all revisited if necessary by chart review or  direct questioning.    Topics of Prolapse, Pain, Pressure were revisited including type, period of onset, level of severity, quality and quantity, associations, trends, exacerbators, alleviators, bleeding, prolapse to reduce, splinting, and prior treatment / surgery.    Topics of Urinary Leakage were revisited including type, period of onset, level of severity, quality and quantity, associations, trends, exacerbators, alleviators, urgency, frequency, nocturia ,urge incontinence / stress incontinence triggers, hesitancy, obstruction with need to catheterize, frequent UTIs >3 in a year diagnosed by culture, hematuria (gross or microscopic), urinary stones, and prior treatment / surgery.    Topics of Fecal Incontinence were revisited including type, period of onset, level of severity, quality and quantity, associations, trends, exacerbators, alleviators, times per  day/week, stool quality / quantity, rectal bleeding, hemorrhoids, constipation / Anismus / Proctalgia fugax, laxative abuse, and prior treatment / surgery.    Topics of General Gynecology were revisited including gravity/parity, #SVD's, perineal delivery trauma, LMP, days of flow, heaviest days, # pads / tampons per day, cramps, anemia, discharge, prior STI's.    Flowsheet:  Screenings reviewed per Flowsheet including Pap smear, mammogram, dexascan, colonoscopy. Any screenings due will be ordered for this visit if patient agrees.      ROS:  Review of Systems   Genitourinary:         Labial concern for texture changes       All other systems negative.    PHYSICAL EXAM:  Physical Exam  Constitutional:       General: She is not in acute distress.     Appearance: Normal appearance. She is normal weight. She is not ill-appearing, toxic-appearing or diaphoretic.   Genitourinary:      Bladder, rectum and urethral meatus normal.      No lesions in the vagina.      Genitourinary Comments: Very subtle, 1 cm area on her left labia minora that is slightly rougher in texture, no lesion, or palpable abnormality noted.       Right Labia: No rash, tenderness, lesions or skin changes.     Left Labia: No tenderness, lesions, skin changes or rash.           Vaginal cuff intact.     No vaginal tenderness.      No vaginal prolapse present.     No vaginal atrophy present.       Right Adnexa: not tender and no mass present.     Left Adnexa: not tender and no mass present.     No cervical lesion.      Uterus is not enlarged or tender.      No uterine mass detected.     No urethral tenderness or mass present.      Pelvic Floor comments: Pelvic cavity: Transvaginal digital palpation and Kegel's squeeze palpable.  Myalgia and myositis of the pelvic floor not present to contra-indicate a procedure.  No significant complications or exposures of any prior placed mesh.  No significant pelvic organ prolapse contraindicating a procedure..  Rectum:       No rectal mass.   HENT:      Head: Normocephalic and atraumatic.   Cardiovascular:      Rate and Rhythm: Normal rate and regular rhythm.   Pulmonary:      Effort: Pulmonary effort is normal.      Breath sounds: Normal breath sounds.   Abdominal:      General: Abdomen is flat. Bowel sounds are normal.      Hernia: There is no hernia  in the left inguinal area or right inguinal area.   Musculoskeletal:         General: Normal range of motion.      Cervical back: Normal range of motion and neck supple.   Neurological:      Mental Status: She is oriented to person, place, and time.   Skin:     General: Skin is warm and dry.   Psychiatric:         Mood and Affect: Mood normal.         Behavior: Behavior normal.   Vitals and nursing note reviewed.       Objective   Blood pressure 125/69, pulse 93, temperature 98.8 F (37.1 C), temperature source Oral, SpO2 97%.  Physical Exam  Genitourinary:   External Genitalia: Labia minora appear symmetric with no specific lesions noted. Slight rough texture noted on the left labia minora, but no suspicious findings.   Speculum Exam: No abnormalities noted inside the vagina. Vaginal walls appear normal with no lesions, increased vascularity, or bleeding, nor abnormal discharge.   Bimanual Exam: Uterus is mobile. No masses detected upon palpation.       ASSESSMENT/PLAN:  Skin texture changes       The patient was counseled regarding review of all conditions discussed.     2.   Educational handouts were discussed & given when applicable.     3.   Testing recommendations were given as indicated.     4.   Detailed questionnaires regarding the patient's specific condition were given to the patient when indicated. The patient understands that these are her responsibility to complete and return on a voluntary basis. Reminders to complete the questionnaires will not be given. The patient understands that failure to return the questionnaires may not give the provider a full picture of the  patient's urogynecologic issues and make treatment less than optimal despite the practitioner's best effort to obtain information.     Assessment & Plan  1. Labial skin changes.  - The labial skin changes does not appear suspicious upon examination.  - The patient reports a different texture and appearance on the left side, which feels rougher than the right. There is no obvious lesion, increased vascularity, or bleeding observed. The patient is advised to avoid using soap for cleaning the area and instead use water to prevent potential irritation.  - Continued follow-up with her gynecologist oncologist is recommended. If there are any changes in the appearance of the area, persistent itching, bleeding, or the emergence of an obvious lesion, she should seek immediate medical attention for a possible biopsy with her gynecologist/oncologist. Patient VU.    2. Pelvic / abdominal pain.  - The patient reports experiencing pain described as cramps or spasms, sometimes shooting, always on the left side. This pain could be related to her digestive system.  - A pelvic ultrasound and CT scan conducted in 09/2023 did not reveal any new or enlarging metastasis in the abdomen or pelvis.  - The patient is advised to monitor the pain and report any changes or worsening symptoms to her healthcare provider, oncologist and surgeon.     Multiple records reviewed. All questions were addressed to the patient's satisfaction.      Point of care: Medical decision making and point of care discussed with the team & supervising physician to qualify as a shared visit when applicable, supervising physician by proxy provider    Follow up: Return if symptoms worsen or fail to improve.  The patient (or guardian, if applicable) and other individuals in attendance with the patient were advised that Artificial Intelligence will be utilized during this visit to record, process the conversation to generate a clinical note and to support improvement  of the AI technology. The patient (or guardian, if applicable) and other individuals in attendance at the appointment consented to the use of AI, including the recording.      An electronic signature was used to authenticate this note.    --Kate Borer, APRN - CNP

## 2024-02-08 NOTE — Telephone Encounter (Signed)
 Patient calls in to schedule an appt to discuss botox for her vocal cords. Informed patient that Clinical research associate will discuss with Dr Jackee prior to scheduling, will call patient back with plan. Patient voiced understanding, no additional needs.

## 2024-02-08 NOTE — Telephone Encounter (Signed)
 LVM for patient explaining that Dr Jackee would recommend finding a new ENT doctor at this time, specifically a laryngologist. The treatment she is looking for would require a close follow up that we would not be able to provide in this clinic. Encouraged patient to reach out to [facility] to see if Dr San accepts her insurance. Clinic information provided for call back if needed, no additional needs.

## 2024-02-10 ENCOUNTER — Encounter

## 2024-02-10 MED ORDER — ESTRADIOL 0.0375 MG/24HR TD PTTW
0.0375 | MEDICATED_PATCH | TRANSDERMAL | 3 refills | 84.00000 days | Status: DC
Start: 2024-02-10 — End: 2024-02-10

## 2024-02-10 MED ORDER — PROGESTERONE 100 MG PO CAPS
100 | ORAL_CAPSULE | Freq: Every evening | ORAL | 3 refills | 90.00000 days | Status: DC
Start: 2024-02-10 — End: 2024-03-07

## 2024-02-10 MED ORDER — ESTRADIOL 0.0375 MG/24HR TD PTTW
0.0375 | MEDICATED_PATCH | TRANSDERMAL | 3 refills | 84.00000 days | Status: DC
Start: 2024-02-10 — End: 2024-03-07

## 2024-02-10 NOTE — Telephone Encounter (Signed)
 Sent in progesterone  as well. Needs both.

## 2024-02-10 NOTE — Progress Notes (Signed)
 Refilled estradiol  patch and Prometrium 

## 2024-02-12 ENCOUNTER — Inpatient Hospital Stay: Payer: PRIVATE HEALTH INSURANCE | Primary: Nurse Practitioner

## 2024-02-12 ENCOUNTER — Inpatient Hospital Stay: Admit: 2024-02-12 | Discharge: 2024-02-12 | Payer: PRIVATE HEALTH INSURANCE | Primary: Nurse Practitioner

## 2024-02-12 ENCOUNTER — Ambulatory Visit
Admit: 2024-02-12 | Discharge: 2024-02-12 | Payer: PRIVATE HEALTH INSURANCE | Attending: Internal Medicine | Primary: Nurse Practitioner

## 2024-02-12 VITALS — BP 115/66 | HR 65 | Temp 97.40000°F | Wt 214.2 lb

## 2024-02-12 DIAGNOSIS — D3A019 Benign carcinoid tumor of the small intestine, unspecified portion: Principal | ICD-10-CM

## 2024-02-12 DIAGNOSIS — C7A011 Malignant carcinoid tumor of the jejunum: Principal | ICD-10-CM

## 2024-02-12 DIAGNOSIS — Z131 Encounter for screening for diabetes mellitus: Principal | ICD-10-CM

## 2024-02-12 LAB — HEMOGLOBIN A1C
Estimated Avg Glucose: 123 mg/dL
Hemoglobin A1C: 5.9 % (ref 4.0–6.0)

## 2024-02-12 LAB — LIPID PANEL
Chol/HDL Ratio: 2.9 (ref <5.0)
Cholesterol, Total: 198 mg/dL (ref 0–199)
HDL: 69 mg/dL (ref >40)
LDL Cholesterol: 104 mg/dL — ABNORMAL HIGH (ref 0–100)
Triglycerides: 127 mg/dL (ref <150)
VLDL: 25 mg/dL (ref 1–30)

## 2024-02-12 LAB — CBC WITH AUTO DIFFERENTIAL
Basophils %: 1 % (ref 0–2)
Basophils Absolute: 0 k/uL (ref 0.0–0.2)
Eosinophils %: 2 % (ref 1–4)
Eosinophils Absolute: 0.1 k/uL (ref 0.0–0.4)
Hematocrit: 39.3 % (ref 36–46)
Hemoglobin: 12.1 g/dL (ref 12.0–16.0)
Lymphocytes %: 46 % — ABNORMAL HIGH (ref 24–44)
Lymphocytes Absolute: 2.7 k/uL (ref 1.0–4.8)
MCH: 22.6 pg — ABNORMAL LOW (ref 26–34)
MCHC: 30.8 g/dL — ABNORMAL LOW (ref 31–37)
MCV: 73.5 fL — ABNORMAL LOW (ref 80–100)
MPV: 7.7 fL (ref 6.0–12.0)
Monocytes %: 8 % (ref 2–11)
Monocytes Absolute: 0.4 k/uL (ref 0.1–1.2)
Neutrophils %: 43 % (ref 36–66)
Neutrophils Absolute: 2.5 k/uL (ref 1.8–7.7)
Platelets: 318 k/uL (ref 140–450)
RBC: 5.34 m/uL — ABNORMAL HIGH (ref 4.0–5.2)
RDW: 15.6 % — ABNORMAL HIGH (ref 12.5–15.4)
WBC: 5.8 k/uL (ref 3.5–11.0)

## 2024-02-12 LAB — COMPREHENSIVE METABOLIC PANEL
ALT: 88 U/L — ABNORMAL HIGH (ref 10–35)
AST: 43 U/L — ABNORMAL HIGH (ref 10–35)
Albumin/Globulin Ratio: 1.6 (ref 1.0–2.5)
Albumin: 4.4 g/dL (ref 3.5–5.2)
Alkaline Phosphatase: 124 U/L — ABNORMAL HIGH (ref 35–104)
Anion Gap: 11 mmol/L (ref 9–16)
BUN: 12 mg/dL (ref 6–20)
CO2: 24 mmol/L (ref 20–31)
Calcium: 10.1 mg/dL (ref 8.6–10.4)
Chloride: 102 mmol/L (ref 98–107)
Creatinine: 0.8 mg/dL (ref 0.6–0.9)
Est, Glom Filt Rate: >90 mL/min/1.73m2 (ref >60)
Glucose: 139 mg/dL — ABNORMAL HIGH (ref 74–99)
Potassium: 3.6 mmol/L — ABNORMAL LOW (ref 3.7–5.3)
Sodium: 137 mmol/L (ref 136–145)
Total Bilirubin: 0.4 mg/dL (ref 0.0–1.2)
Total Protein: 7.1 g/dL (ref 6.6–8.7)

## 2024-02-12 MED ORDER — OCTREOTIDE ACETATE 50 MCG/ML IJ SOLN
50 | Freq: Three times a day (TID) | INTRAMUSCULAR | 0 refills | Status: DC | PRN
Start: 2024-02-12 — End: 2024-04-01

## 2024-02-12 MED ORDER — OCTREOTIDE ACETATE 30 MG IM KIT
30 | Freq: Once | INTRAMUSCULAR | Status: AC
Start: 2024-02-12 — End: 2024-02-12
  Administered 2024-02-12: 17:00:00 30 mg via INTRAMUSCULAR

## 2024-02-12 MED FILL — SANDOSTATIN LAR DEPOT 30 MG IM KIT: 30 mg | INTRAMUSCULAR | Qty: 1 | Fill #0

## 2024-02-12 NOTE — Patient Instructions (Signed)
 Tx as planned  Labs now  Ct pet before rv

## 2024-02-12 NOTE — Progress Notes (Signed)
 Pt here for Sandostatin  injection  Arrives ambulatory.  Denies any new complaints.  Labs drawn, per doctors orders  Pt was seen by Dr. Tamela, order rec'd to proceed with tx.  Tx complete without incident.  Pt d/c'd in stable condition.  Returns 03/11/2024 for revisit with Dr Tamela.

## 2024-02-12 NOTE — Telephone Encounter (Signed)
 AVS [id number]    Instructions   from Dr. Livia Pence, MD    Tx as planned  Labs now  Ct pet before rv        Today's medication changes   START taking:  octreotide  (SANDOSTATIN )   Accurate as of February 12, 2024 12:19 PM.  Review your updated medication list below.    Ask your doctor where to pick up these medications   octreotide  50 MCG/ML Soln    PET CT Skull Base To Mid-Thigh Dotatate  Scheduled for 02/19/2024    Labs ordered today  CBC with Auto Differential  Complete this on or around 02/12/2024.  Chromogranin A  Complete this on or around 02/12/2024.  Comprehensive Metabolic Panel  Complete this on or around 02/12/2024.    RV [id number]    An After Visit Summary was printed and given to the patient.    Electronically signed by Hassell Custard, MA on 02/12/2024 at 12:23 PM

## 2024-02-15 LAB — CHROMOGRANIN A: Chromogranin A: 51 ng/mL (ref 0–187)

## 2024-02-18 ENCOUNTER — Encounter

## 2024-02-19 ENCOUNTER — Ambulatory Visit: Payer: PRIVATE HEALTH INSURANCE | Primary: Nurse Practitioner

## 2024-02-21 NOTE — Progress Notes (Signed)
 Monica Wood                                                                                                                  02/12/2024  MRN:   [id number]  Date of Birth:  September 01, 1975  PCP:                           Cheyenne Raisin, APRN - CNP  Referring Physician: No ref. provider found  Treating Physician Name: LIVIA PENCE, MD      Reason for visit:  Chief Complaint   Patient presents with    Follow-up     Review status of disease    Other     Patient having some side effects having dizzy spells they come and go. They can last for several minutes.  Patient can get the shacks after she eats. She can go right to sleep after.       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  Patient complains of diarrhea, cutaneous flushing, and visual disturbances.    She reports episodes of cutaneous flushing, lightheadedness, and profound fatigue postprandially, often necessitating approximately 2 hours of sleep. Symptoms were particularly severe yesterday but ameliorated following brief rest. She experienced malaise the week preceding her injection, requiring additional rest. Symptoms have been intermittent, with a notable episode last weekend involving cutaneous flushing and tremulousness while exposed to sunlight. She is uncertain if these symptoms are attributable to hypoglycemia. The frequency of episodes had diminished until recurrence last weekend and yesterday.    Diarrhea has been pronounced this week, resulting in significant debilitation and confinement to bed. Pharmacological intervention provided some relief, but diarrhea was severe at the beginning of the week, prompting cessation of oral intake. Even minimal  ingestion, such as a cough drop, precipitated urgent defecation. She is on a 30 mg dose of medication, increased to extend the interval between doses to 4 weeks, but the past couple of months have been challenging.    Visual disturbances, including blurry vision in both eyes, have been present for the past month. Initially attributed to prolonged television viewing, she can see the television clearly but experiences difficulty reading her phone or books due to blurriness or diplopia. Her husband's reading glasses provided some relief, whereas her own glasses did not. She plans to undergo further ophthalmologic evaluation.    She has ingrown fingernails, which are swollen and painful. Additionally, her hands and feet remain persistently xerotic despite regular scrubbing and moisturizing.    During this visit patient's allergy, social, medical, surgical history and medications were reviewed and updated.    Summary of Case/History:  Monica Wood a 48 y.o.female is a patient with unresectable well-differentiated endocrine tumor  of small bowel presents to the clinic to establish care for further workup and management.  Patient complains of having abdominal pain .  Presented to the ER underwent imaging which showed soft tissue mass in the ventral mesentery anterior to the aorta concerning for neoplasm.  Patient underwent EGD and colonoscopy by GI which showed a 1 cm polyp in the colon but no other significant abnormality.  Subsequently patient was taken to the OR for exploratory laparotomy small bowel resection and lymph node dissection.  Pathology from the surgery showed multiple well-differentiated neuroendocrine tumors largest measuring 2.8 cm in the mid small bowel resection.  There was also metastasis noted to the lymph nodes.  Patient had a grade 1 disease.  Ki-67 was 1-2%.  Pathological stage was T3 N1.  Patient is recovering from the surgery.  She has a PET scan scheduled next week.  Presents to the clinic to  establish care and for further workup.  Patient also noted to have microcytic anemia.  Iron studies done earlier show adequate iron stores.  Patient also complains of hoarseness of voice which has been going on for a while.    Past Medical History:   Past Medical History:   Diagnosis Date    Anemia     Anxiety and depression     Arthritis     Breast pain     Bursitis     Carcinoid tumor (HCC)     Chronic diarrhea     Depression 2020    Digestive problems     Female genital prolapse, unspecified     Fibromyalgia     Irritable bowel syndrome 2001    Obesity 1985    Osteoarthritis 2019    Retention of urine     Scoliosis     STD (sexually transmitted disease)     Chlamydia in college    Tendinitis     Tension headache     Urinary incontinence     Vitamin D deficiency     Vitamin D deficiency      Past Surgical History:  Past Surgical History:   Procedure Laterality Date    ABDOMINOPLASTY      COLONOSCOPY N/A 11/18/2022    COLONOSCOPY POLYPECTOMY SNARE BIOPSY performed by Agapito Heman, MD at [facility] OR    ENDOMETRIAL ABLATION  2008    ESOPHAGOGASTRIC FUNDOPLICATION  2000    GALLBLADDER SURGERY      LAPAROTOMY N/A 11/19/2022    LAPAROTOMY EXPLORATORY.  SMALL BOWEL RESECTION.  INCISIONAL BIOPSY OF MESENTERIC LYMPH NODE performed by Teresa Landry LABOR, MD at [facility] OR    OTHER SURGICAL HISTORY      ablation    TUBAL LIGATION      UPPER GASTROINTESTINAL ENDOSCOPY N/A 11/18/2022    ESOPHAGOGASTRODUODENOSCOPY BIOPSY performed by Agapito Heman, MD at [facility] OR     Patient Family Social History:  Family History   Problem Relation Age of Onset    Diabetes Maternal Grandmother     Diabetes Father     Diabetes Mother     Allergy (Severe) Mother     Anemia Mother     Arthritis Mother     Depression Mother     Other Mother         ADHD    Breast Cancer Neg Hx     Ovarian Cancer Neg Hx     Endometrial Cancer Neg Hx     Uterine Cancer Neg Hx     Cervical Cancer  Neg Hx     Vaginal Cancer Neg Hx     Colon Cancer Neg  Hx      Social History     Tobacco Use    Smoking status: Never    Smokeless tobacco: Never   Vaping Use    Vaping status: Never Used   Substance Use Topics    Alcohol use: Not Currently     Alcohol/week: 4.0 standard drinks of alcohol     Types: 4 Glasses of wine per week     Comment: 3/4 times a week    Drug use: Not Currently     Comment: uses gummies periodically for pain control     Current Medications:  Current Outpatient Medications   Medication Sig Dispense Refill    octreotide  (SANDOSTATIN ) 50 MCG/ML SOLN Inject 1 mL into the skin every 8 hours as needed (diarrhea) 90 mL 0    estradiol  (VIVELLE ) 0.0375 MG/24HR APPLY 1 PATCH TOPICALLY TO THE SKIN 2 TIMES A WEEK 8 patch 3    progesterone  (PROMETRIUM ) 100 MG CAPS capsule Take 1 capsule by mouth nightly 30 capsule 3    NONFORMULARY Indications: allegera      famotidine  (PEPCID ) 40 MG tablet Take 1 tablet by mouth at bedtime 90 tablet 3    lipase-protease-amylase (CREON ) 36000-114000 units CPEP delayed release capsule 1 capsule TID with meals 270 capsule 11    albuterol  sulfate HFA (PROVENTIL  HFA) 108 (90 Base) MCG/ACT inhaler Inhale 2 puffs into the lungs every 6 hours as needed for Wheezing or Shortness of Breath 18 g 2    buPROPion  (WELLBUTRIN  XL) 150 MG extended release tablet Take 1 tablet by mouth every morning 90 tablet 1    DULoxetine  (CYMBALTA ) 60 MG extended release capsule Take 1 capsule by mouth daily 90 capsule 1    PREBIOTIC PRODUCT PO Take by mouth      Calcium Carbonate-Vitamin D (OYSTER SHELL CALCIUM/D) 500-5 MG-MCG TABS Take 1 tablet by mouth      vitamin E 400 UNIT capsule Take 1 capsule by mouth daily      Cholecalciferol (VITAMIN D3) 1.25 MG (50000 UT) CAPS Take by mouth      ferrous sulfate  (IRON 325) 325 (65 Fe) MG tablet Take 1 tablet by mouth daily (with breakfast) 90 tablet 1    Lisdexamfetamine Dimesylate 40 MG CAPS Take 40 mg by mouth daily.      testosterone  cypionate (DEPOTESTOTERONE CYPIONATE) 200 MG/ML injection Inject 1 mL into  the muscle once for 1 dose. Max Daily Amount: 200 mg 1 mL 0    tiZANidine  (ZANAFLEX ) 2 MG tablet Take 1 tablet by mouth daily as needed (back muscle) (Patient not taking: Reported on 02/12/2024) 30 tablet 1     No current facility-administered medications for this visit.     Allergies:   Oxycodone, Parsley leaves, Percocet [oxycodone-acetaminophen ], Codeine, and Morphine     Review of Systems:    Constitutional: No fever or chills. No night sweats, no weight loss   Eyes: No eye discharge, double vision, or eye pain   HEENT: negative for sore mouth, sore throat, hoarseness and voice change   Respiratory: negative for cough , sputum, dyspnea, wheezing, hemoptysis, chest pain   Cardiovascular: negative for chest pain, dyspnea, palpitations, orthopnea, PND   Gastrointestinal: negative for nausea, vomiting, constipation, abdominal pain, Dysphagia, hematemesis and hematochezia.  Positive for abdominal discomfort stable diarrhea improved  Genitourinary: negative for frequency, dysuria, nocturia, urinary incontinence, and hematuria   Integument: negative for  rash, skin lesions, bruises.   Hematologic/Lymphatic: negative for easy bruising, bleeding, lymphadenopathy, or petechiae   Endocrine: negative for heat or cold intolerance,weight changes, change in bowel habits and hair loss   Musculoskeletal: negative for myalgias, arthralgias, pain, joint swelling,and bone pain   Neurological: negative for headaches, dizziness, seizures, weakness, numbness    Physical Exam:  Vitals: BP 115/66   Pulse 65   Temp 97.4 F (36.3 C) (Temporal)   Wt 97.2 kg (214 lb 3.2 oz)   SpO2 99%   BMI 36.75 kg/m   General appearance - well appearing, no in pain or distress  Mental status - AAO X3  Eyes - pupils equal and reactive, extraocular eye movements intact  Mouth - mucous membranes moist, pharynx normal without lesions  Neck - supple, no significant adenopathy  Lymphatics - no palpable lymphadenopathy, no hepatosplenomegaly  Chest - clear to  auscultation, no wheezes, rales or rhonchi, symmetric air entry  Heart - normal rate, regular rhythm, normal S1, S2, no murmurs  Abdomen - soft, nontender, nondistended, no masses or organomegaly.  Positive healing surgical incision site  Neurological - alert, oriented, normal speech, no focal findings or movement disorder noted  Extremities - peripheral pulses normal, no pedal edema, no clubbing or cyanosis  Skin - normal coloration and turgor, no rashes, no suspicious skin lesions noted       DATA:  Results for orders placed or performed during the hospital encounter of 12/25/23   CBC with Auto Differential   Result Value Ref Range    WBC 6.3 3.5 - 11.0 k/uL    RBC 5.10 4.0 - 5.2 m/uL    Hemoglobin 11.5 (L) 12.0 - 16.0 g/dL    Hematocrit 62.4 36 - 46 %    MCV 73.5 (L) 80 - 100 fL    MCH 22.5 (L) 26 - 34 pg    MCHC 30.6 (L) 31 - 37 g/dL    RDW 85.1 87.4 - 84.5 %    Platelets 348 140 - 450 k/uL    MPV 7.1 6.0 - 12.0 fL    Neutrophils % 50 36 - 66 %    Lymphocytes % 38 24 - 44 %    Monocytes % 9 2 - 11 %    Eosinophils % 2 1 - 4 %    Basophils % 1 0 - 2 %    Neutrophils Absolute 3.20 1.8 - 7.7 k/uL    Lymphocytes Absolute 2.40 1.0 - 4.8 k/uL    Monocytes Absolute 0.60 0.1 - 1.2 k/uL    Eosinophils Absolute 0.10 0.0 - 0.4 k/uL    Basophils Absolute 0.00 0.0 - 0.2 k/uL   Comprehensive Metabolic Panel   Result Value Ref Range    Sodium 138 136 - 145 mmol/L    Potassium 4.2 3.7 - 5.3 mmol/L    Chloride 104 98 - 107 mmol/L    CO2 23 20 - 31 mmol/L    Anion Gap 11 9 - 16 mmol/L    Glucose 108 (H) 74 - 99 mg/dL    BUN 11 6 - 20 mg/dL    Creatinine 0.9 0.6 - 0.9 mg/dL    Est, Glom Filt Rate 79 >60 mL/min/1.7m2    Calcium 9.6 8.6 - 10.4 mg/dL    Total Protein 6.9 6.6 - 8.7 g/dL    Albumin 4.0 3.5 - 5.2 g/dL    Albumin/Globulin Ratio 1.4 1.0 - 2.5    Total Bilirubin 0.4 0.0 - 1.2 mg/dL  Alkaline Phosphatase 142 (H) 35 - 104 U/L    ALT 48 (H) 10 - 35 U/L    AST 29 10 - 35 U/L   Iron and TIBC   Result Value Ref Range    Iron  109 37 - 145 ug/dL    TIBC 649 749 - 549 ug/dL    Iron % Saturation 31 20 - 55 %    UIBC 241 112 - 347 ug/dL   Ferritin   Result Value Ref Range    Ferritin 144 15 - 150 ng/mL     No results found.        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  Reviewed records from outside facility.  Patient is having symptoms of flushing diarrhea fatigue.  Will obtain CT PET dotatate to rule out disease progression  Continue octreotide .   Pt seen at [facility] , disease unresectable. Continue octreotide .  Will call in short acting octreotide  for symptom management  Patient is currently on octreotide  30 mg every 4 weeks.  Discussed natural history of well-differentiated neuroendocrine tumor.  Patient feeling much better with HRT  Will need surveillance for IPMN.  Will repeat MRI in 1 year, 06/02/2024  Continue oral iron supplementation. Still microcytic anemia . 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  HRT will not interfere treatment with patient's neuroendocrine tumor.  Okay to take from oncology standpoint  Continue exercises for the vocal cord for voice changes  NCCN guidelines were reviewed and discussed with the patient.  Hemoglobin electrophoresis unremarkable however alpha thalassemia not ruled out  The diagnosis and care plan were discussed with the patient in detail. I discussed the natural history of the disease, prognosis, risks and goals of therapy and answered all the patients questions to the best of my ability.  Patient expressed understanding and was in agreement.        LIVIA PENCE, MD    This note is created with the assistance of a speech recognition program.  While  intending to generate a document that actually reflects the content of the visit, the document can still have some errors including those of syntax and sound a like substitutions which may escape proof reading.  It such instances, actual meaning can be extrapolated by contextual diversion.

## 2024-03-06 ENCOUNTER — Encounter

## 2024-03-07 ENCOUNTER — Ambulatory Visit
Admit: 2024-03-07 | Discharge: 2024-03-07 | Payer: PRIVATE HEALTH INSURANCE | Attending: Gastroenterology | Primary: Nurse Practitioner

## 2024-03-07 VITALS — BP 118/61 | HR 66 | Temp 97.30000°F | Resp 16 | Wt 213.0 lb

## 2024-03-07 DIAGNOSIS — D3A019 Benign carcinoid tumor of the small intestine, unspecified portion: Principal | ICD-10-CM

## 2024-03-07 MED ORDER — FAMOTIDINE 40 MG PO TABS
40 | ORAL_TABLET | Freq: Every evening | ORAL | 3 refills | 30.00000 days | Status: DC
Start: 2024-03-07 — End: 2024-05-16

## 2024-03-07 MED ORDER — ESTRADIOL 0.0375 MG/24HR TD PTTW
0.0375 | MEDICATED_PATCH | TRANSDERMAL | 1 refills | 84.00000 days | Status: DC
Start: 2024-03-07 — End: 2024-04-12

## 2024-03-07 MED ORDER — BUPROPION HCL ER (XL) 150 MG PO TB24
150 | ORAL_TABLET | Freq: Every morning | ORAL | 1 refills | Status: AC
Start: 2024-03-07 — End: ?

## 2024-03-07 MED ORDER — PROGESTERONE 100 MG PO CAPS
100 | ORAL_CAPSULE | Freq: Every day | ORAL | 1 refills | Status: AC
Start: 2024-03-07 — End: ?

## 2024-03-07 NOTE — Addendum Note (Signed)
 Addended by: Dhruva Orndoff on: 03/07/2024 04:31 PM     Modules accepted: Orders

## 2024-03-07 NOTE — Telephone Encounter (Signed)
 Patient was last seen 01/2024

## 2024-03-07 NOTE — Telephone Encounter (Signed)
 Last Visit Date: 01/28/2024   Next Visit Date: 08/02/2024

## 2024-03-07 NOTE — Progress Notes (Signed)
 GI CLINIC FOLLOW UP    INTERVAL HISTORY:   No referring provider defined for this encounter.    Chief Complaint   Patient presents with    Follow-up     6 month follow up            HISTORY OF PRESENT ILLNESS: [name] is a 48 y.o. 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 Ct showed partial SBO due to mesenteric mass. Mass was not previously seen on MRI abd 08/14/22. Tumor pathology significant for multiple well-differentiated NETs extending through the muscularis propria into subserosal tissue without penetration of the overlying serosa with local lymph node involvement. CEA, CA125, CA19-9, chromagranin A wnl 11/17/22.     Continuing to follow with Dr Tamela  Who referred the to [facility] clinic for second opinion on surgery as the patient was deemed not to be operable by our surgeons, the referral was made on July 17, 2023 by Dr. 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    I did review all the labs results available for the labs which were ordered by the primary care physician, and the other consultants, we search on epic at [facility] and all the available care everywhere epic    I did review all the imaging studies of the abdomen available on EMR, ordered by the primary care physician and the other consultant    I did review all the pathology from the biopsies done on the previous  endoscopies    Patient's PMH/PSH,SH,PSYCH Hx, MEDs, ALLERGIES, and ROS were all reviewed and updated in the appropriate sections.    PAST MEDICAL HISTORY:  Past Medical History:   Diagnosis Date    Anemia     Anxiety and depression     Arthritis     Breast pain     Bursitis     Carcinoid tumor (HCC)     Chronic diarrhea     Depression 2020    Digestive problems     Female genital prolapse, unspecified     Fibromyalgia     Irritable bowel syndrome 2001    Obesity 1985    Osteoarthritis 2019    Retention of urine     Scoliosis     STD (sexually transmitted disease)     Chlamydia in college    Tendinitis     Tension headache     Urinary incontinence     Vitamin D deficiency     Vitamin D deficiency        Past Surgical History:   Procedure Laterality Date    ABDOMINOPLASTY      COLONOSCOPY N/A 11/18/2022    COLONOSCOPY POLYPECTOMY SNARE BIOPSY performed by Agapito Heman, MD at [facility] [place]    ENDOMETRIAL ABLATION  2008    ESOPHAGOGASTRIC FUNDOPLICATION  2000    GALLBLADDER SURGERY      LAPAROTOMY  N/A 11/19/2022    LAPAROTOMY EXPLORATORY.  SMALL BOWEL RESECTION.  INCISIONAL BIOPSY OF MESENTERIC LYMPH NODE performed by Teresa Landry LABOR, MD at [facility] OR    OTHER SURGICAL HISTORY      ablation    TUBAL LIGATION      UPPER GASTROINTESTINAL ENDOSCOPY N/A 11/18/2022    ESOPHAGOGASTRODUODENOSCOPY BIOPSY performed by Agapito Heman, MD at [facility] [place]       CURRENT MEDICATIONS:    Current Outpatient Medications:     buPROPion  (WELLBUTRIN  XL) 150 MG extended release tablet, Take 1 tablet by mouth every morning, Disp: 90 tablet, Rfl: 1    progesterone  (PROMETRIUM ) 100 MG CAPS capsule, Take 1 capsule by mouth daily, Disp: 90 capsule, Rfl: 1    estradiol  (VIVELLE ) 0.0375 MG/24HR, Place 1 patch onto the skin Twice a Week, Disp: 8 patch, Rfl: 1    octreotide  (SANDOSTATIN ) 50 MCG/ML SOLN, Inject 1 mL into the skin every 8 hours as needed (diarrhea), Disp: 90 mL, Rfl: 0    NONFORMULARY, Indications: allegera, Disp: ,  Rfl:     testosterone  cypionate (DEPOTESTOTERONE CYPIONATE) 200 MG/ML injection, Inject 1 mL into the muscle once for 1 dose. Max Daily Amount: 200 mg, Disp: 1 mL, Rfl: 0    famotidine  (PEPCID ) 40 MG tablet, Take 1 tablet by mouth at bedtime, Disp: 90 tablet, Rfl: 3    lipase-protease-amylase (CREON ) 36000-114000 units CPEP delayed release capsule, 1 capsule TID with meals, Disp: 270 capsule, Rfl: 11    albuterol  sulfate HFA (PROVENTIL  HFA) 108 (90 Base) MCG/ACT inhaler, Inhale 2 puffs into the lungs every 6 hours as needed for Wheezing or Shortness of Breath, Disp: 18 g, Rfl: 2    DULoxetine  (CYMBALTA ) 60 MG extended release capsule, Take 1 capsule by mouth daily, Disp: 90 capsule, Rfl: 1    PREBIOTIC PRODUCT PO, Take by mouth, Disp: , Rfl:     Calcium Carbonate-Vitamin D (OYSTER SHELL CALCIUM/D) 500-5 MG-MCG TABS, Take 1 tablet by mouth, Disp: , Rfl:     vitamin E 400 UNIT capsule, Take 1 capsule by mouth daily, Disp: , Rfl:     Cholecalciferol (VITAMIN D3) 1.25 MG (50000 UT) CAPS, Take by mouth, Disp: , Rfl:     ferrous sulfate  (IRON 325) 325 (65 Fe) MG tablet, Take 1 tablet by mouth daily (with breakfast), Disp: 90 tablet, Rfl: 1    Lisdexamfetamine Dimesylate 40 MG CAPS, Take 40 mg by mouth daily., Disp: , Rfl:     tiZANidine  (ZANAFLEX ) 2 MG tablet, Take 1 tablet by mouth daily as needed (back muscle) (Patient not taking: Reported on 03/07/2024), Disp: 30 tablet, Rfl: 1    ALLERGIES:   Allergies   Allergen Reactions    Oxycodone Anaphylaxis    Parsley Leaves Anaphylaxis, Hives, Itching and Shortness Of Breath    Percocet [Oxycodone-Acetaminophen ] Shortness Of Breath    Codeine      Anaphylaxis     Morphine         FAMILY HISTORY:       Problem Relation Age of Onset    Diabetes Maternal Grandmother     Diabetes Father     Diabetes Mother     Allergy (Severe) Mother     Anemia Mother     Arthritis Mother     Depression Mother     Other Mother         ADHD    Breast Cancer Neg Hx     Ovarian Cancer Neg Hx  Endometrial Cancer Neg Hx     Uterine Cancer Neg Hx     Cervical Cancer Neg Hx     Vaginal Cancer Neg Hx     Colon Cancer Neg Hx          SOCIAL HISTORY:   Social History     Socioeconomic History    Marital status: Married     Spouse name: Not on file    Number of children: Not on file    Years of education: Not on file    Highest education level: Not on file   Occupational History    Not on file   Tobacco Use    Smoking status: Never    Smokeless tobacco: Never   Vaping Use    Vaping status: Never Used   Substance and Sexual Activity    Alcohol use: Not Currently     Alcohol/week: 4.0 standard drinks of alcohol     Types: 4 Glasses of wine per week     Comment: 3/4 times a week    Drug use: Not Currently     Comment: uses gummies periodically for pain control    Sexual activity: Not Currently     Partners: Male   Other Topics Concern    Not on file   Social History Narrative    Not on file     Social Drivers of Health     Financial Resource Strain: Low Risk  (12/01/2022)    Overall Financial Resource Strain (CARDIA)     Difficulty of Paying Living Expenses: Not hard at all   Food Insecurity: No Food Insecurity (02/14/2024)    Received from [facility]    Hunger Screening     Within the past 12 months we worried whether our food would run out before we got money to buy more.: Never True     Within the past 12 months the food we bought just didn't last and we didn't have money to get more.: Never True   Transportation Needs: No Transportation Needs (01/28/2024)    PRAPARE - Therapist, art (Medical): No     Lack of Transportation (Non-Medical): No   Physical Activity: Insufficiently Active (02/16/2022)    Exercise Vital Sign     Days of Exercise per Week: 4 days     Minutes of Exercise per Session: 30 min   Stress: Not on file   Social Connections: Not on file   Intimate Partner Violence: Not At Risk (02/16/2022)    Humiliation, Afraid, Rape, and Kick questionnaire     Fear of Current  or Ex-Partner: No     Emotionally Abused: No     Physically Abused: No     Sexually Abused: No   Housing Stability: Low Risk  (01/28/2024)    Housing Stability Vital Sign     Unable to Pay for Housing in the Last Year: No     Number of Times Moved in the Last Year: 0     Homeless in the Last Year: No       REVIEW OF SYSTEMS: A 12-point review of systemswas obtained and pertinent positives and negatives were enumerated above in the history of present illness. All other reviewed systems / symptoms were negative.    Review of Systems   Constitutional:  Negative for appetite change, fatigue and unexpected weight change.   HENT:  Negative for sore throat, trouble swallowing and voice change.    Respiratory:  Negative for cough, choking and wheezing.    Cardiovascular:  Negative for chest pain, palpitations and leg swelling.   Gastrointestinal:  Negative for abdominal distention, abdominal pain, anal bleeding, blood in stool, constipation, diarrhea, nausea, rectal pain and vomiting.   Genitourinary:  Negative for flank pain.   Allergic/Immunologic: Negative for food allergies and immunocompromised state.   Neurological:  Negative for dizziness, speech difficulty, weakness, light-headedness and headaches.   Hematological:  Does not bruise/bleed easily.   Psychiatric/Behavioral:  Negative for sleep disturbance.            LABORATORY DATA: Reviewed  Lab Results   Component Value Date    WBC 5.8 02/12/2024    HGB 12.1 02/12/2024    HCT 39.3 02/12/2024    MCV 73.5 (L) 02/12/2024    PLT 318 02/12/2024    NA 137 02/12/2024    K 3.6 (L) 02/12/2024    CL 102 02/12/2024    CO2 24 02/12/2024    BUN 12 02/12/2024    CREATININE 0.8 02/12/2024    BILITOT 0.4 02/12/2024    ALKPHOS 124 (H) 02/12/2024    AST 43 (H) 02/12/2024    ALT 88 (H) 02/12/2024    INR <0.8 05/04/2023         Lab Results   Component Value Date    RBC 5.34 (H) 02/12/2024    HGB 12.1 02/12/2024    MCV 73.5 (L) 02/12/2024    MCH 22.6 (L) 02/12/2024    MCHC 30.8 (L)  02/12/2024    RDW 15.6 (H) 02/12/2024    MPV 7.7 02/12/2024    BASOPCT 1 02/12/2024    LYMPHSABS 2.70 02/12/2024    MONOSABS 0.40 02/12/2024    NEUTROABS 2.50 02/12/2024    EOSABS 0.10 02/12/2024    BASOSABS 0.00 02/12/2024         DIAGNOSTIC TESTING:     No results found.       PHYSICAL EXAMINATION: Vital signs reviewed per the nursing documentation.     BP 118/61   Pulse 66   Temp 97.3 F (36.3 C) (Tympanic)   Resp 16   Wt 96.6 kg (213 lb)   SpO2 98%   BMI 36.54 kg/m   Body mass index is 36.54 kg/m.   Physical Exam  Vitals and nursing note reviewed.   Constitutional:       General: She is not in acute distress.     Appearance: She is well-developed. She is not diaphoretic.   HENT:      Head: Normocephalic.      Mouth/Throat:      Pharynx: No oropharyngeal exudate.   Eyes:      General: No scleral icterus.     Pupils: Pupils are equal, round, and reactive to light.   Neck:      Thyroid: No thyromegaly.      Vascular: No JVD.      Trachea: No tracheal deviation.   Cardiovascular:      Rate and Rhythm: Normal rate and regular rhythm.      Heart sounds: Normal heart sounds. No murmur heard.  Pulmonary:      Effort: Pulmonary effort is normal. No respiratory distress.      Breath sounds: Normal breath sounds. No wheezing.   Abdominal:      General: Bowel sounds are normal. There is no distension.      Palpations: Abdomen is soft.      Tenderness: There is no abdominal tenderness. There is no  guarding or rebound.      Comments: No ascites   Musculoskeletal:         General: Normal range of motion.      Cervical back: Normal range of motion and neck supple.   Skin:     General: Skin is warm.      Coloration: Skin is not pale.      Findings: No erythema or rash.      Comments: She is not diaphoretic   Neurological:      Mental Status: She is alert and oriented to person, place, and time.      Deep Tendon Reflexes: Reflexes are normal and symmetric.   Psychiatric:         Behavior: Behavior normal.         Thought  Content: Thought content normal.         Judgment: Judgment normal.           IMPRESSION: Ms. Beirne is a 48 y.o. female with      Diagnosis Orders   1. Carcinoid tumor of small intestine, unspecified location, unspecified whether malignant (HCC)        2. Pancreatic insufficiency        3. IPMN (intraductal papillary mucinous neoplasm)        4. LFT elevation        5. Pancreatic divisum          WILL ADD IMODIUM    CONTINUE OCTREOTIDE  SQ  AWAIT PET SCAN NEXT FEW DAYS   FOLLOW WITH ONCOLOGY     Medication where reviewed, side effects from the GI medication were reviewed with the patient  We did refill the GI medications            Diet/life style/natural hx /complication of the dx were all explained in details   Past medical, past surgical, social history, psychiatric history, medications or allergies, all reviewed and  updated    Thank you for allowing me to participate in the care of Ms. Melby. For any further questions please do not hesitate to contact me.    I have reviewed and agree with the ROS entered by the MA/RN.     Note is dictated utilizing voice recognition software. Unfortunately this leads to occasional typographical errors. Please contact our office if you have any questions.  This note is created with the assistance of the speech recognition program. While intending to generate a document that actually reflects the content of the visit, it can still have some errors including those of syntax and sound-a-like substitutions which may escape proof reading. Actual meaning can be extrapolated by contextual inference.    Bethene Rhodes, MD  [facility]  O: [phone]

## 2024-03-11 ENCOUNTER — Ambulatory Visit: Payer: PRIVATE HEALTH INSURANCE | Attending: Internal Medicine | Primary: Nurse Practitioner

## 2024-03-11 ENCOUNTER — Inpatient Hospital Stay: Admit: 2024-03-11 | Discharge: 2024-03-11 | Payer: PRIVATE HEALTH INSURANCE | Primary: Nurse Practitioner

## 2024-03-11 ENCOUNTER — Inpatient Hospital Stay: Admit: 2024-03-11 | Payer: PRIVATE HEALTH INSURANCE | Attending: Internal Medicine | Primary: Nurse Practitioner

## 2024-03-11 DIAGNOSIS — C7A011 Malignant carcinoid tumor of the jejunum: Principal | ICD-10-CM

## 2024-03-11 MED ORDER — COPPER CU 64 DOTATATE 1 MCI/ML IV SOLN
1 | Freq: Once | INTRAVENOUS | Status: DC | PRN
Start: 2024-03-11 — End: 2024-03-14
  Administered 2024-03-11: 12:00:00 4.27 via INTRAVENOUS

## 2024-03-11 MED ORDER — NORMAL SALINE FLUSH 0.9 % IV SOLN
0.9 | Freq: Once | INTRAVENOUS | Status: AC
Start: 2024-03-11 — End: 2024-03-11
  Administered 2024-03-11: 12:00:00 10 mL via INTRAVENOUS

## 2024-03-11 MED ORDER — COPPER CU 64 DOTATATE 1 MCI/ML IV SOLN
1 | Freq: Once | INTRAVENOUS | Status: DC | PRN
Start: 2024-03-11 — End: 2024-03-11

## 2024-03-11 MED ORDER — NORMAL SALINE FLUSH 0.9 % IV SOLN
0.9 | INTRAVENOUS | Status: DC | PRN
Start: 2024-03-11 — End: 2024-03-14

## 2024-03-11 MED ORDER — OCTREOTIDE ACETATE 30 MG IM KIT
30 | Freq: Once | INTRAMUSCULAR | Status: AC
Start: 2024-03-11 — End: 2024-03-11
  Administered 2024-03-11: 15:00:00 30 mg via INTRAMUSCULAR

## 2024-03-11 MED FILL — SANDOSTATIN LAR DEPOT 30 MG IM KIT: 30 mg | INTRAMUSCULAR | Qty: 1

## 2024-03-11 NOTE — Progress Notes (Signed)
 Pt here for sandostatin .  Arrives ambulatory.  Denies any new complaints.  Tx complete without incident.  Pt d/c'd in stable condition.  Returns 9/12 for MD visit per pt request and 9/26 for sandostatin .

## 2024-03-22 ENCOUNTER — Encounter

## 2024-03-22 ENCOUNTER — Ambulatory Visit: Admit: 2024-03-22 | Discharge: 2024-03-22 | Payer: PRIVATE HEALTH INSURANCE | Primary: Nurse Practitioner

## 2024-03-22 VITALS — BP 112/58 | HR 97 | Temp 97.70000°F

## 2024-03-22 DIAGNOSIS — R6882 Decreased libido: Principal | ICD-10-CM

## 2024-03-22 MED ORDER — TESTOSTERONE CYPIONATE 200 MG/ML IM SOLN
200 | Freq: Once | INTRAMUSCULAR | Status: AC
Start: 2024-03-22 — End: 2024-03-22
  Administered 2024-03-22: 13:00:00 50 mg via INTRAMUSCULAR

## 2024-03-22 NOTE — Progress Notes (Signed)
 [facility], [facility]  [facility] [place]. [facility] UROGYNECOLOGY AND PELVIC REHABILITATION   [address] [place] [address]  Dept: [phone]  Date: 03/22/2024  Patient Name: Monica Wood    VISIT - FOLLOW UP VISIT     CC: had concerns including Other (Patient here for depotestosterone inj. Patient wants to discuss regarding mole on a vaginal area  that she noted , no pain .).    HPI:   History of Present Illness  The patient presents for a testosterone  injection and discussion of a mole on her labia.    She is scheduled for a testosterone  injection today, which has been approved by her vascular doctor and oncologist, per patient. Her last injection was 3 months ago, and she reports that it has been beneficial. She can discern when she needs another dose as her symptoms  of low libido worsen during those weeks.    She has noticed a mole in her vaginal area, which occasionally itches. It feels grainy to touch on one side. She has no history of HPV. She consulted her gynecologist-oncologist about this, but the mole was not seen then.      Past Surgical History:   Procedure Laterality Date    ABDOMINOPLASTY      COLONOSCOPY N/A 11/18/2022    COLONOSCOPY POLYPECTOMY SNARE BIOPSY performed by Agapito Heman, MD at [facility] [facility]    ENDOMETRIAL ABLATION  2008    ESOPHAGOGASTRIC FUNDOPLICATION  2000    GALLBLADDER SURGERY      LAPAROTOMY N/A 11/19/2022    LAPAROTOMY EXPLORATORY.  SMALL BOWEL RESECTION.  INCISIONAL BIOPSY OF MESENTERIC LYMPH NODE performed by Teresa Landry LABOR, MD at [facility] OR    OTHER SURGICAL HISTORY      ablation    TUBAL LIGATION      UPPER GASTROINTESTINAL ENDOSCOPY N/A 11/18/2022    ESOPHAGOGASTRODUODENOSCOPY BIOPSY performed by Agapito Heman, MD at [facility] OR      Past Medical History:   Diagnosis Date    Anemia     Anxiety and depression     Arthritis     Breast pain     Bursitis     Carcinoid tumor (HCC)     Chronic diarrhea     Depression  2020    Digestive problems     Female genital prolapse, unspecified     Fibromyalgia     Irritable bowel syndrome 2001    Obesity 1985    Osteoarthritis 2019    Retention of urine     Scoliosis     STD (sexually transmitted disease)     Chlamydia in college    Tendinitis     Tension headache     Urinary incontinence     Vitamin D deficiency     Vitamin D deficiency         Overall state of well-being, dietary and nutritional habits, exercise routines of at least 30 minutes 3 times a week, bowel and bladder function, smoking history, HRT history, sexual activity and partner/s, dyspareunia, and immunizations all revisited if necessary by chart review or direct questioning.    Topics of Prolapse, Pain, Pressure were revisited including type, period of onset, level of severity, quality and quantity, associations, trends, exacerbators, alleviators, bleeding, prolapse to reduce, splinting, and prior treatment / surgery.    Topics of Urinary Leakage were revisited including type, period of onset, level of severity, quality and quantity, associations, trends,  exacerbators, alleviators, urgency, frequency, nocturia ,urge incontinence / stress incontinence triggers, hesitancy, obstruction with need to catheterize, frequent UTIs >3 in a year diagnosed by culture, hematuria (gross or microscopic), urinary stones, and prior treatment / surgery.    Topics of Fecal Incontinence were revisited including type, period of onset, level of severity, quality and quantity, associations, trends, exacerbators, alleviators, times per day/week, stool quality / quantity, rectal bleeding, hemorrhoids, constipation / Anismus / Proctalgia fugax, laxative abuse, and prior treatment / surgery.    Topics of General Gynecology were revisited including gravity/parity, #SVD's, perineal delivery trauma, LMP, days of flow, heaviest days, # pads / tampons per day, cramps, anemia, discharge, prior STI's.    Flowsheet:  Screenings reviewed per Flowsheet  including Pap smear, mammogram, dexascan, colonoscopy. Any screenings due will be ordered for this visit if patient agrees.      ROS:  Review of Systems   Gastrointestinal: Negative.    Genitourinary: Negative.    All other systems reviewed and are negative.      All other systems negative.    PHYSICAL EXAM:  Physical Exam  Constitutional:       General: She is not in acute distress.     Appearance: Normal appearance. She is normal weight. She is not ill-appearing, toxic-appearing or diaphoretic.   HENT:      Head: Normocephalic and atraumatic.   Cardiovascular:      Rate and Rhythm: Normal rate and regular rhythm.   Pulmonary:      Effort: Pulmonary effort is normal.      Breath sounds: Normal breath sounds.   Abdominal:      General: Abdomen is flat. Bowel sounds are normal.   Musculoskeletal:         General: Normal range of motion.      Cervical back: Normal range of motion and neck supple.   Neurological:      Mental Status: She is oriented to person, place, and time.   Skin:     General: Skin is warm and dry.   Psychiatric:         Mood and Affect: Mood normal.         Behavior: Behavior normal.   Vitals and nursing note reviewed.       Objective   Blood pressure (!) 112/58, pulse 97, temperature 97.7 F (36.5 C), temperature source Oral, SpO2 97%.  Physical Exam  Genitourinary:   External Genitalia: Patient brining in picture of mole on labia.        ASSESSMENT/PLAN:  Low libido  -     testosterone  cypionate (DEPOTESTOTERONE CYPIONATE) injection 50 mg; 50 mg, IntraMUSCular, ONCE, 1 dose, On Tue 03/22/24 at 0945        The patient was counseled regarding review of all conditions discussed.     2.   Educational handouts were discussed & given when applicable.     3.   Testing recommendations were given as indicated.     4.   Detailed questionnaires regarding the patient's specific condition were given to the patient when indicated. The patient understands that these are her responsibility to complete and return  on a voluntary basis. Reminders to complete the questionnaires will not be given. The patient understands that failure to return the questionnaires may not give the provider a full picture of the patient's urogynecologic issues and make treatment less than optimal despite the practitioner's best effort to obtain information.     Assessment & Plan  1. Testosterone   injection:  - The patient has not had a testosterone  injection in 3 months and reports that it helps her when administered.  - The patient was advised to follow up with her vascular doctor and oncologist, who have both approved the testosterone  injection.  - A testosterone  injection was administered today.    2. Mole in the vaginal area:  - The patient reports a mole in the labia that sometimes itches and feels grainy. There is no history of HPV, and it does not hurt or bleed.  - The patient was advised to follow up with her gynecologist-oncologist for further evaluation and potential biopsy if deemed necessary. Patient voiced understanding that she will follow up with this provider.     PROCEDURE  Procedure: Testosterone  injection  - Procedural Discussion: Discussed the testosterone  injection, including the patient's previous approval from vascular and oncologist doctors. The patient confirmed the need for the injection and its benefits.  - Medication: Testosterone   - Technique: Injection administered in the buttocks as per patient's preference.      Multiple records reviewed. All questions were addressed to the patient's satisfaction.        Point of care: Medical decision making and point of care discussed with the team & supervising physician to qualify as a shared visit when applicable, supervising physician by proxy provider    Follow up: Return if symptoms worsen or fail to improve.     The patient (or guardian, if applicable) and other individuals in attendance with the patient were advised that Artificial Intelligence will be utilized during this  visit to record, process the conversation to generate a clinical note and to support improvement of the AI technology. The patient (or guardian, if applicable) and other individuals in attendance at the appointment consented to the use of AI, including the recording.      An electronic signature was used to authenticate this note.    --Kate Borer, APRN - CNP

## 2024-03-22 NOTE — Telephone Encounter (Signed)
 Called and scheduled patient for 03/23/24.  Patient voiced understanding and appreciation.

## 2024-03-23 ENCOUNTER — Ambulatory Visit
Admit: 2024-03-23 | Discharge: 2024-03-23 | Payer: PRIVATE HEALTH INSURANCE | Attending: Physician Assistant | Primary: Nurse Practitioner

## 2024-03-23 VITALS — BP 107/63 | HR 69 | Temp 98.10000°F | Wt 213.4 lb

## 2024-03-23 DIAGNOSIS — D280 Benign neoplasm of vulva: Principal | ICD-10-CM

## 2024-03-23 DIAGNOSIS — N898 Other specified noninflammatory disorders of vagina: Principal | ICD-10-CM

## 2024-03-23 NOTE — Progress Notes (Signed)
 [facility]necologic Oncology  [address], [address], [address]  [place]  [address]    Monica Wood is a 48 y.o. female who presents for a follow up.    Chief Complaint:  Labial concern    HPI: Patient is a pleasant 48 year old amenorrheic female secondary to history of endometrial ablation who was referred to [facility] gynecologic oncology for findings of abnormal vaginal activity on PET CT scan in March 2025    Of note patient carries a history of carcinoid tumor of the small intestine.  She underwent small bowel resection and lymph node dissection in May 2024.    Patient was seen at [facility] gynecologic oncology in April 2025 for these abnormal findings and pelvic exam evaluation.  Of note patient also carries a history of low-grade cervical dysplasia s/p LEEP back in 2001.  A repeat Pap smear was obtained April 2025 which was negative for intraepithelial lesion or malignancy, HPV negative.  However endocervical zone component.      She was recommended repeat Pap smear in April 2026 with focus on endocervical sampling.    Patient's pelvic examination was negative for vaginal mass or findings to correlate in respect to the expressive lesion on the previous PET/CT scan.    Of note patient also receives testosterone  injections through urogynecology for menopausal symptoms.     Most recent PET CT scan September 2025 per medical oncology has no mention of vaginal and pelvic avidity.    Today, the patient reports concerns of mole on labia by self discovery. She is sexually active with single female partner. She admits to use of vibrators in the vagina. She has noticed increased moisture to left labia with intermittent vaginal discharge. She reports baseline urination and bowel movements. Denies vaginal bleeding or pain.       Past Medical History:   Diagnosis Date    Anemia     Anxiety and depression     Arthritis     Breast pain     Bursitis     Carcinoid tumor (HCC)     Chronic diarrhea     Depression 2020     Digestive problems     Female genital prolapse, unspecified     Fibromyalgia     Irritable bowel syndrome 2001    Obesity 1985    Osteoarthritis 2019    Retention of urine     Scoliosis     STD (sexually transmitted disease)     Chlamydia in college    Tendinitis     Tension headache     Urinary incontinence     Vitamin D deficiency     Vitamin D deficiency        Past Surgical History:   Procedure Laterality Date    ABDOMINOPLASTY      COLONOSCOPY N/A 11/18/2022    COLONOSCOPY POLYPECTOMY SNARE BIOPSY performed by Agapito Heman, MD at [facility] OR    ENDOMETRIAL ABLATION  2008    ESOPHAGOGASTRIC FUNDOPLICATION  2000    GALLBLADDER SURGERY      LAPAROTOMY N/A 11/19/2022    LAPAROTOMY EXPLORATORY.  SMALL BOWEL RESECTION.  INCISIONAL BIOPSY OF MESENTERIC LYMPH NODE performed by Teresa Landry LABOR, MD at [facility] OR    OTHER SURGICAL HISTORY      ablation    TUBAL LIGATION      UPPER GASTROINTESTINAL ENDOSCOPY N/A 11/18/2022    ESOPHAGOGASTRODUODENOSCOPY BIOPSY performed by Agapito Heman, MD at [facility] OR  Family History   Problem Relation Age of Onset    Diabetes Maternal Grandmother     Diabetes Father     Diabetes Mother     Allergy (Severe) Mother     Anemia Mother     Arthritis Mother     Depression Mother     Other Mother         ADHD    Breast Cancer Neg Hx     Ovarian Cancer Neg Hx     Endometrial Cancer Neg Hx     Uterine Cancer Neg Hx     Cervical Cancer Neg Hx     Vaginal Cancer Neg Hx     Colon Cancer Neg Hx        Review of Systems  I have personally reviewed and agree with the review of the systems done by my ancillary staff in the [facility] documentation.    PET CT TUMOR IMAGE SKULL THIGH DOTATATE   Result Date: 03/15/2024  EXAMINATION: WHOLE BODY PET/CT 03/11/2024 TECHNIQUE: Following IV injection of 4.3 mCi of Cu-64 DOTATATE , PET  tumor imaging was acquired from the base of the skull to the mid thighs.  Computed tomography was used for purposes of attenuation correction and anatomic  localization. Fusion imaging was utilized for interpretation. Uptake time 57 min.  Glucose level not applicable mg/dl. COMPARISON: July 10, 2023 HISTORY: ORDERING SYSTEM PROVIDED HISTORY: Malignant carcinoid tumor of the jejunum [facility]) TECHNOLOGIST PROVIDED HISTORY: assess disease status Is the patient pregnant?->No Reason for Exam: assess disease status, Malignant carcinoid tumor of the jejunum FINDINGS: HEAD/NECK: No abnormal radiotracer avid lesion is seen in the visualized basal portion of the head. No abnormal no radiotracer avidity is seen in the lymph nodes of the neck. No abnormal radiotracer accumulation is noted in the soft tissues of the nasopharynx, oropharynx, glottic, or subglottic compartments. No abnormal radiotracer accumulation is seen in the remainder of the soft tissues of the neck. The CT portion of the study demonstrates no masses or lymphadenopathy in the neck. CHEST: No abnormal activity is seen in the hilum or mediastinum. No radiotracer avid lesion is seen in the lung parenchyma. The CT portion of the chest demonstrates no lung parenchymal nodules or masses. ABDOMEN: No abnormal radiotracer avid lesion is seen in the lymph nodes of the   mesentery, porta hepatis, gastrohepatic ligament. 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  Previously this lesion demonstrated a max SUV of 53.1 and measured 2.4 cm diameter.  Just craniad to this lesion is a smaller radiotracer avid lesion with a max SUV of 19.7 and measures 7 mm diameter and previously measured a max SUV of 14.3 and was 7 mm diameter as well.. No abnormal radiotracer accumulation is noted in the liver or the remainder of the intra-abdominal solid organs. CT portion of the study of the abdomen demonstrates no abnormality of the intra-abdominal solid organs. No abnormality of the stomach the remainder of the enteric tract. PELVIS: No iliac chain  lymphadenopathy is seen. No other lymph nodes are seen in the pelvis. No abnormal metabolic activity is seen in the pelvic soft tissues. BONES/SOFT TISSUE: No abnormal radiotracer avid lesion is seen in the subcutaneous soft tissues. No abnormal radiotracer avid lesion is seen in the skeletal system. The CT portion of the study in bone window settings demonstrates no osteolytic or osteoblastic lesions. INCIDENTAL CT FINDINGS: Surgical clips are  demonstrated in the right upper quadrant consistent with prior cholecystectomy.     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  No new lesions are identified. Krenning score: 4 KRENNING SCORE FOR GA-68 DOTATATE UPTAKE 0: none 1: much lower than liver 2: slightly less than or equal to liver 3: greater than liver 4: greater than spleen       Lab Results   Component Value Date/Time    CA125 14 11/17/2022 05:53 AM       Objective:  Vitals:    03/23/24 1132   BP: 107/63   BP Site: Left Upper Arm   Patient Position: Sitting   BP Cuff Size: Large Adult   Pulse: 69   Temp: 98.1 F (36.7 C)   TempSrc: Oral   SpO2: 98%   Weight: 96.8 kg (213 lb 6.4 oz)       Physical Exam   General:  Well appearing, alert and oriented x 3, No acute distress.    Pelvic Exam:  External pelvic examination reveals normal appearing external genitalia. No obvious lesions to naked eye. Attention turned to area of concern on L labia minora. Palpation reveals small granular texture with scattered small submucosal cystic lesions in the area. Lesions are non tender, non enlarged and mobile. Remainder of vulva without concerns. Mild thin discharge present at vaginal introitus, vaginitis obtained.    Assessment:  Labial cyst     Vaginal discharge    History cervix dysplasia    Plan:  Reassurance provided today on examination findings.  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 Advised to monitor, plan to biopsy if changes occur  Follow up on vaginitis swab, discussed appropriate feminine hygiene approaches (I.e avoiding soaps, etc to region)  Discussed opting for womens probiotics to aid in vaginal health      Follow Up:  April 2026 for annual gynecology care/pap smear HPV testing    I spent 35 minutes providing care to the patient, counseling and coordinating care, discussing the patient's current situation, reviewing her options, counseling and education her and answering her questions.  All of the patient's pertinent images, labs and previous records and procedures were reviewed.    Electronically signed by Mardy Lunger, PA-C on 03/24/24 at 4:09 PM

## 2024-03-23 NOTE — Progress Notes (Signed)
 Review of Systems   Constitutional:  Negative for appetite change, chills, diaphoresis, fatigue, fever and unexpected weight change.   HENT:   Negative for hearing loss, lump/mass, mouth sores, nosebleeds, sore throat, tinnitus, trouble swallowing and voice change.    Eyes:  Negative for eye problems and icterus.   Respiratory:  Positive for chest tightness (with activity) and shortness of breath (with activity). Negative for cough, hemoptysis and wheezing.    Cardiovascular:  Negative for chest pain, leg swelling and palpitations.   Gastrointestinal:  Positive for abdominal pain (RUQ discomfort) and diarrhea. Negative for abdominal distention, blood in stool, constipation, nausea, rectal pain and vomiting.   Endocrine: Negative for hot flashes.   Genitourinary:  Positive for vaginal discharge (Clear discharge, odor). Negative for bladder incontinence, difficulty urinating, dyspareunia, dysuria, frequency, hematuria, menstrual problem, nocturia, pelvic pain and vaginal bleeding.    Musculoskeletal:  Negative for arthralgias, back pain, flank pain, gait problem, myalgias, neck pain and neck stiffness.   Skin:  Positive for itching. Negative for rash and wound.        Patient concerned regarding brown spot on left labial minora    Neurological:  Negative for dizziness, extremity weakness, gait problem, headaches, light-headedness, numbness, seizures and speech difficulty.   Hematological:  Negative for adenopathy. Does not bruise/bleed easily.   Psychiatric/Behavioral:  Negative for confusion, decreased concentration, depression, sleep disturbance and suicidal ideas. The patient is not nervous/anxious.

## 2024-03-24 ENCOUNTER — Inpatient Hospital Stay: Payer: PRIVATE HEALTH INSURANCE | Primary: Nurse Practitioner

## 2024-03-24 LAB — VAGINITIS DNA PROBE
Candida species: NEGATIVE
GARDNERELLA VAGINALIS: NEGATIVE
Trichomonas: NEGATIVE

## 2024-03-24 NOTE — Other (Signed)
 Called and informed patient of vaginitis results and instructions.  Patient voiced understanding and appreciation.

## 2024-03-24 NOTE — Telephone Encounter (Signed)
 Patient returned phone call to office, notified her that staff will return call.

## 2024-03-25 ENCOUNTER — Ambulatory Visit: Payer: PRIVATE HEALTH INSURANCE | Attending: Internal Medicine | Primary: Nurse Practitioner

## 2024-03-25 ENCOUNTER — Ambulatory Visit
Admit: 2024-03-25 | Discharge: 2024-04-04 | Payer: PRIVATE HEALTH INSURANCE | Attending: Internal Medicine | Primary: Nurse Practitioner

## 2024-03-25 VITALS — BP 98/60 | HR 75 | Temp 97.40000°F | Resp 16 | Wt 211.4 lb

## 2024-03-25 DIAGNOSIS — D3A019 Benign carcinoid tumor of the small intestine, unspecified portion: Principal | ICD-10-CM

## 2024-03-25 MED ORDER — DIPHENOXYLATE-ATROPINE 2.5-0.025 MG PO TABS
2.5-0.025 | ORAL_TABLET | Freq: Four times a day (QID) | ORAL | 0 refills | Status: AC | PRN
Start: 2024-03-25 — End: 2024-04-04

## 2024-03-25 NOTE — Patient Instructions (Signed)
 Rv in around 3 months on the day of octreotide 

## 2024-03-25 NOTE — Telephone Encounter (Signed)
 Instructions   from Dr. Livia Pence, MD    Rv in around 3 months on the day of octreotide          Patient is scheduled on 12/19 to follow up with Shahid. Patient was given AVS.

## 2024-04-01 ENCOUNTER — Encounter

## 2024-04-01 MED ORDER — OCTREOTIDE ACETATE 50 MCG/ML IJ SOLN
50 | INTRAMUSCULAR | 0 refills | 28.00000 days | Status: AC
Start: 2024-04-01 — End: 2024-08-02

## 2024-04-03 NOTE — Progress Notes (Signed)
 Monica Wood                                                                                                                  03/25/2024  MRN:   [id number]  Date of Birth:  Aug 23, 1975  PCP:                           Cheyenne Raisin, APRN - CNP  Referring Physician: No ref. 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  The patient presents with complaints of diarrheal symptoms.    She reports an exacerbation of diarrhea this week and requests a prescription for Lomotil , which she finds more efficacious than Imodium . She expresses uncertainty regarding the use of short-acting medications prescribed earlier this week. Last month, she was advised to abstain from taking any medication prior to her PET scan. The patient is scheduled to travel out of town for a duration of 5 days next week. She is currently undergoing hormonal therapy and is due for her next injection at the end of the month.    During this visit patient's allergy, social, medical, surgical history and medications were reviewed and updated.    Summary of Case/History:  Monica Wood a 48 y.o.female is a patient with unresectable well-differentiated endocrine tumor of small bowel presents to the clinic to establish care for further workup and management.  Patient complains of having abdominal pain .  Presented to the ER underwent imaging which showed soft tissue mass in the ventral mesentery anterior to the aorta concerning  for neoplasm.  Patient underwent EGD and colonoscopy by GI which showed a 1 cm polyp in the colon but no other significant abnormality.  Subsequently patient was taken to the OR for exploratory laparotomy small bowel resection and lymph node dissection.  Pathology from the surgery showed multiple well-differentiated neuroendocrine tumors largest measuring 2.8 cm in the mid small bowel resection.  There was also metastasis noted to the lymph nodes.  Patient had a grade 1 disease.  Ki-67 was 1-2%.  Pathological stage was T3 N1.  Patient is recovering from the surgery.  She has a PET scan scheduled next week.  Presents to the clinic to establish care and for further workup.  Patient also noted to have microcytic anemia.  Iron studies done earlier show adequate iron stores.  Patient  also complains of hoarseness of voice which has been going on for a while.    Past Medical History:   Past Medical History:   Diagnosis Date    Anemia     Anxiety and depression     Arthritis     Breast pain     Bursitis     Carcinoid tumor (HCC)     Chronic diarrhea     Depression 2020    Digestive problems     Female genital prolapse, unspecified     Fibromyalgia     Irritable bowel syndrome 2001    Obesity 1985    Osteoarthritis 2019    Retention of urine     Scoliosis     STD (sexually transmitted disease)     Chlamydia in college    Tendinitis     Tension headache     Urinary incontinence     Vitamin D deficiency     Vitamin D deficiency      Past Surgical History:  Past Surgical History:   Procedure Laterality Date    ABDOMINOPLASTY      COLONOSCOPY N/A 11/18/2022    COLONOSCOPY POLYPECTOMY SNARE BIOPSY performed by Agapito Heman, MD at [facility] [place] [facility]    ENDOMETRIAL ABLATION  2008    ESOPHAGOGASTRIC FUNDOPLICATION  2000    GALLBLADDER SURGERY      LAPAROTOMY N/A 11/19/2022    LAPAROTOMY EXPLORATORY.  SMALL BOWEL RESECTION.  INCISIONAL BIOPSY OF MESENTERIC LYMPH NODE performed by Teresa Landry LABOR, MD at [facility] OR    OTHER  SURGICAL HISTORY      ablation    TUBAL LIGATION      UPPER GASTROINTESTINAL ENDOSCOPY N/A 11/18/2022    ESOPHAGOGASTRODUODENOSCOPY BIOPSY performed by Agapito Heman, MD at [facility] [place] [facility]     Patient Family Social History:  Family History   Problem Relation Age of Onset    Diabetes Maternal Grandmother     Diabetes Father     Diabetes Mother     Allergy (Severe) Mother     Anemia Mother     Arthritis Mother     Depression Mother     Other Mother         ADHD    Breast Cancer Neg Hx     Ovarian Cancer Neg Hx     Endometrial Cancer Neg Hx     Uterine Cancer Neg Hx     Cervical Cancer Neg Hx     Vaginal Cancer Neg Hx     Colon Cancer Neg Hx      Social History     Tobacco Use    Smoking status: Never    Smokeless tobacco: Never   Vaping Use    Vaping status: Never Used   Substance Use Topics    Alcohol use: Not Currently     Alcohol/week: 4.0 standard drinks of alcohol     Types: 4 Glasses of wine per week     Comment: 3/4 times a week    Drug use: Yes     Frequency: 2.0 times per week     Comment: uses gummies periodically for pain control     Current Medications:  Current Outpatient Medications   Medication Sig Dispense Refill    diphenoxylate -atropine  (LOMOTIL ) 2.5-0.025 MG per tablet Take 1 tablet by mouth 4 times daily as needed for Diarrhea for up to 10 days. Max Daily Amount: 4 tablets 40 tablet 0    octreotide  (SANDOSTATIN ) 50 MCG/ML SOLN  INJECT 1 ML (50 MCG) UNDER THE SKIN EVERY 8 HOURS AS NEEDED (DIARRHEA) (DISCARD UNUSED PORTION) 270 mL 0    Multiple Vitamin (MULTIVITAMIN) TABS tablet Take 1 tablet by mouth daily      Ferrous Sulfate  (IRON SLOW RELEASE PO) Take 1 tablet by mouth daily      Fexofenadine HCl (ALLEGRA ALLERGY PO) Take 1 tablet by mouth daily      buPROPion  (WELLBUTRIN  XL) 150 MG extended release tablet Take 1 tablet by mouth every morning 90 tablet 1    progesterone  (PROMETRIUM ) 100 MG CAPS capsule Take 1 capsule by mouth daily 90 capsule 1    estradiol  (VIVELLE ) 0.0375 MG/24HR Place 1 patch  onto the skin Twice a Week 8 patch 1    famotidine  (PEPCID ) 40 MG tablet Take 1 tablet by mouth at bedtime 90 tablet 3    NONFORMULARY Indications: allegera      lipase-protease-amylase (CREON ) 36000-114000 units CPEP delayed release capsule 1 capsule TID with meals 270 capsule 11    albuterol  sulfate HFA (PROVENTIL  HFA) 108 (90 Base) MCG/ACT inhaler Inhale 2 puffs into the lungs every 6 hours as needed for Wheezing or Shortness of Breath 18 g 2    DULoxetine  (CYMBALTA ) 60 MG extended release capsule Take 1 capsule by mouth daily 90 capsule 1    PREBIOTIC PRODUCT PO Take by mouth      Calcium Carbonate-Vitamin D (OYSTER SHELL CALCIUM/D) 500-5 MG-MCG TABS Take 1 tablet by mouth (Patient not taking: Reported on 03/25/2024)      vitamin E 400 UNIT capsule Take 1 capsule by mouth daily (Patient not taking: Reported on 03/25/2024)      Cholecalciferol (VITAMIN D3) 1.25 MG (50000 UT) CAPS Take by mouth (Patient not taking: Reported on 03/25/2024)      ferrous sulfate  (IRON 325) 325 (65 Fe) MG tablet Take 1 tablet by mouth daily (with breakfast) (Patient not taking: Reported on 03/25/2024) 90 tablet 1    Lisdexamfetamine Dimesylate 40 MG CAPS Take 40 mg by mouth daily.      tiZANidine  (ZANAFLEX ) 2 MG tablet Take 1 tablet by mouth daily as needed (back muscle) (Patient not taking: Reported on 03/25/2024) 30 tablet 1     No current facility-administered medications for this visit.     Allergies:   Oxycodone, Percocet [oxycodone-acetaminophen ], Soybean-containing drug products, Codeine, Morphine , and Parsley leaves    Review of Systems:    Constitutional: No fever or chills. No night sweats, no weight loss   Eyes: No eye discharge, double vision, or eye pain   HEENT: negative for sore mouth, sore throat, hoarseness and voice change   Respiratory: negative for cough , sputum, dyspnea, wheezing, hemoptysis, chest pain   Cardiovascular: negative for chest pain, dyspnea, palpitations, orthopnea, PND   Gastrointestinal: negative for  nausea, vomiting, constipation, abdominal pain, Dysphagia, hematemesis and hematochezia.  Positive for abdominal discomfort stable diarrhea improved  Genitourinary: negative for frequency, dysuria, nocturia, urinary incontinence, and hematuria   Integument: negative for rash, skin lesions, bruises.   Hematologic/Lymphatic: negative for easy bruising, bleeding, lymphadenopathy, or petechiae   Endocrine: negative for heat or cold intolerance,weight changes, change in bowel habits and hair loss   Musculoskeletal: negative for myalgias, arthralgias, pain, joint swelling,and bone pain   Neurological: negative for headaches, dizziness, seizures, weakness, numbness    Physical Exam:  Vitals: BP 98/60   Pulse 75   Temp 97.4 F (36.3 C) (Temporal)   Resp 16   Wt 95.9 kg (211 lb 6.4  oz)   SpO2 100%   BMI 36.27 kg/m   General appearance - well appearing, no in pain or distress  Mental status - AAO X3  Eyes - pupils equal and reactive, extraocular eye movements intact  Mouth - mucous membranes moist, pharynx normal without lesions  Neck - supple, no significant adenopathy  Lymphatics - no palpable lymphadenopathy, no hepatosplenomegaly  Chest - clear to auscultation, no wheezes, rales or rhonchi, symmetric air entry  Heart - normal rate, regular rhythm, normal S1, S2, no murmurs  Abdomen - soft, nontender, nondistended, no masses or organomegaly.  Positive healing surgical incision site  Neurological - alert, oriented, normal speech, no focal findings or movement disorder noted  Extremities - peripheral pulses normal, no pedal edema, no clubbing or cyanosis  Skin - normal coloration and turgor, no rashes, no suspicious skin lesions noted       DATA:  Results for orders placed or performed during the hospital encounter of 03/23/24   Vaginitis DNA Probe    Specimen: Vagina   Result Value Ref Range    Source .VAGINAL SWAB     Trichomonas NEGATIVE NEGATIVE    GARDNERELLA VAGINALIS NEGATIVE NEGATIVE    Candida species  NEGATIVE NEGATIVE     PET CT TUMOR IMAGE SKULL THIGH DOTATATE   Result Date: 03/15/2024  EXAMINATION: WHOLE BODY PET/CT 03/11/2024 TECHNIQUE: Following IV injection of 4.3 mCi of Cu-64 DOTATATE , PET  tumor imaging was acquired from the base of the skull to the mid thighs.  Computed tomography was used for purposes of attenuation correction and anatomic localization. Fusion imaging was utilized for interpretation. Uptake time 57 min.  Glucose level not applicable mg/dl. COMPARISON: July 10, 2023 HISTORY: ORDERING SYSTEM PROVIDED HISTORY: Malignant carcinoid tumor of the jejunum [facility]) TECHNOLOGIST PROVIDED HISTORY: assess disease status Is the patient pregnant?->No Reason for Exam: assess disease status, Malignant carcinoid tumor of the jejunum FINDINGS: HEAD/NECK: No abnormal radiotracer avid lesion is seen in the visualized basal portion of the head. No abnormal no radiotracer avidity is seen in the lymph nodes of the neck. No abnormal radiotracer accumulation is noted in the soft tissues of the nasopharynx, oropharynx, glottic, or subglottic compartments. No abnormal radiotracer accumulation is seen in the remainder of the soft tissues of the neck. The CT portion of the study demonstrates no masses or lymphadenopathy in the neck. CHEST: No abnormal activity is seen in the hilum or mediastinum. No radiotracer avid lesion is seen in the lung parenchyma. The CT portion of the chest demonstrates no lung parenchymal nodules or masses. ABDOMEN: No abnormal radiotracer avid lesion is seen in the lymph nodes of the   mesentery, porta hepatis, gastrohepatic ligament. 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  Previously this lesion demonstrated a max SUV of 53.1 and measured 2.4 cm diameter.  Just craniad to this lesion is a smaller radiotracer avid lesion with a max SUV of 19.7 and measures 7 mm diameter and previously measured a max  SUV of 14.3 and was 7 mm diameter as well.. No abnormal radiotracer accumulation is noted in the liver or the remainder of the intra-abdominal solid organs. CT portion of the study of the abdomen demonstrates no abnormality of the intra-abdominal solid organs. No abnormality of the stomach the remainder of the enteric tract. PELVIS: No iliac chain lymphadenopathy is seen. No other lymph nodes are seen in the pelvis. No  abnormal metabolic activity is seen in the pelvic soft tissues. BONES/SOFT TISSUE: No abnormal radiotracer avid lesion is seen in the subcutaneous soft tissues. No abnormal radiotracer avid lesion is seen in the skeletal system. The CT portion of the study in bone window settings demonstrates no osteolytic or osteoblastic lesions. INCIDENTAL CT FINDINGS: Surgical clips are demonstrated in the right upper quadrant consistent with prior cholecystectomy.     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  No new lesions are identified. 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  Reviewed records from outside facility.  Reviewed CT PET dotatate overall stable disease  Patient will use short acting octreotide  for symptom management  Pt seen at [facility] , disease unresectable. Continue octreotide .  Will call in short acting octreotide  for symptom  management  Patient is currently on octreotide  LAR 30 mg every 4 weeks.  Discussed natural history of well-differentiated neuroendocrine tumor.  Patient feeling much better with HRT  Will need surveillance for IPMN.  Will repeat MRI in 1 year, 06/02/2024  Continue oral iron supplementation. Still microcytic anemia . 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  HRT will not interfere treatment with patient's neuroendocrine tumor.  Okay to take from oncology standpoint  Continue exercises for the vocal cord for voice changes  NCCN guidelines were reviewed and discussed with the patient.  Hemoglobin electrophoresis unremarkable however alpha thalassemia not ruled out  The diagnosis and care plan were discussed with the patient in detail. I discussed the natural history of the disease, prognosis, risks and goals of therapy and answered all the patients questions to the best of my ability.  Patient expressed understanding and was in agreement.        LIVIA PENCE, MD    This note is created with the assistance of a speech recognition program.  While intending to generate a document that actually reflects the content of the visit, the document can still have some errors including those of syntax and sound a like substitutions which may escape proof reading.  It such instances, actual meaning can be extrapolated by contextual diversion.

## 2024-04-04 NOTE — Telephone Encounter (Signed)
 Name: Monica Wood  DOB: 11/26/1975  MRN: [id number]    Oncology Navigation Follow-Up Note    Contact Type:  Telephone    Notes: Writer called pt to check on her and to see how she was feeling. No answer, VM left requesting a return call.       Electronically signed by Burnard Gaskins, RN on 04/04/2024 at 1:56 PM

## 2024-04-07 ENCOUNTER — Encounter

## 2024-04-08 ENCOUNTER — Ambulatory Visit: Payer: PRIVATE HEALTH INSURANCE | Attending: Internal Medicine | Primary: Nurse Practitioner

## 2024-04-08 ENCOUNTER — Inpatient Hospital Stay: Admit: 2024-04-08 | Discharge: 2024-04-08 | Payer: PRIVATE HEALTH INSURANCE | Primary: Nurse Practitioner

## 2024-04-08 DIAGNOSIS — D3A019 Benign carcinoid tumor of the small intestine, unspecified portion: Secondary | ICD-10-CM

## 2024-04-08 MED ORDER — OCTREOTIDE ACETATE 30 MG IM KIT
30 | Freq: Once | INTRAMUSCULAR | Status: AC
Start: 2024-04-08 — End: 2024-04-08
  Administered 2024-04-08: 16:00:00 30 mg via INTRAMUSCULAR

## 2024-04-08 MED FILL — SANDOSTATIN LAR DEPOT 30 MG IM KIT: 30 mg | INTRAMUSCULAR | Qty: 1 | Fill #0

## 2024-04-12 ENCOUNTER — Encounter

## 2024-04-12 MED ORDER — ESTRADIOL 0.0375 MG/24HR TD PTTW
0.0375 | MEDICATED_PATCH | TRANSDERMAL | 1 refills | 30.00000 days | Status: DC
Start: 2024-04-12 — End: 2024-05-16

## 2024-04-12 NOTE — Telephone Encounter (Signed)
 Pt has appt in Dec 2025 for Testosterone  injection; I spoke w/ pt today and made aware of office break in and possible compromise to medication. Per Board of Pharmacy recommendation, I explained new protocol related to ordering medication and bringing for each visit; as well as wasting of current medication. Office does have one sealed vial of testosterone  for patient to use at next visit.SABRA

## 2024-04-13 NOTE — Telephone Encounter (Signed)
 done

## 2024-04-27 ENCOUNTER — Encounter

## 2024-05-06 ENCOUNTER — Inpatient Hospital Stay: Admit: 2024-05-06 | Discharge: 2024-05-06 | Payer: PRIVATE HEALTH INSURANCE | Primary: Nurse Practitioner

## 2024-05-06 DIAGNOSIS — D3A019 Benign carcinoid tumor of the small intestine, unspecified portion: Secondary | ICD-10-CM

## 2024-05-06 MED ORDER — OCTREOTIDE ACETATE 30 MG IM KIT
30 | Freq: Once | INTRAMUSCULAR | Status: AC
Start: 2024-05-06 — End: 2024-05-06
  Administered 2024-05-06: 15:00:00 30 mg via INTRAMUSCULAR

## 2024-05-06 MED FILL — SANDOSTATIN LAR DEPOT 30 MG IM KIT: 30 mg | INTRAMUSCULAR | Qty: 1 | Fill #0

## 2024-05-06 NOTE — Progress Notes (Signed)
"  Pt here for Sandostatin  injection.   Injection given without incident.  Pt discharged in stable condition.   Returns 06-03-24 for next Sandostatin .    "

## 2024-05-16 ENCOUNTER — Encounter

## 2024-05-16 MED ORDER — FLUTICASONE PROPIONATE 50 MCG/ACT NA SUSP
50 | Freq: Every day | NASAL | 1 refills | 60.00000 days | Status: AC
Start: 2024-05-16 — End: ?

## 2024-05-16 MED ORDER — FAMOTIDINE 40 MG PO TABS
40 | ORAL_TABLET | Freq: Every evening | ORAL | 3 refills | 30.00000 days | Status: AC | PRN
Start: 2024-05-16 — End: ?

## 2024-05-16 MED ORDER — ESTRADIOL 0.0375 MG/24HR TD PTTW
0.0375 | MEDICATED_PATCH | TRANSDERMAL | 1 refills | 84.00000 days | Status: DC
Start: 2024-05-16 — End: 2024-05-31

## 2024-05-16 MED ORDER — CREON 36000-114000 UNITS PO CPEP
36000-114000 | ORAL_CAPSULE | ORAL | 3 refills | 14.00000 days | Status: AC
Start: 2024-05-16 — End: ?

## 2024-05-16 NOTE — Telephone Encounter (Signed)
"  Last Visit Date: 01/28/2024   Next Visit Date: 08/02/2024     "

## 2024-05-31 ENCOUNTER — Encounter

## 2024-05-31 MED ORDER — ESTRADIOL 0.0375 MG/24HR TD PTTW
0.0375 | MEDICATED_PATCH | TRANSDERMAL | 3 refills | 84.00000 days | Status: AC
Start: 2024-05-31 — End: ?

## 2024-06-03 ENCOUNTER — Inpatient Hospital Stay
Admit: 2024-06-03 | Discharge: 2024-06-03 | Payer: PRIVATE HEALTH INSURANCE | Attending: Internal Medicine | Primary: Nurse Practitioner

## 2024-06-03 DIAGNOSIS — D3A019 Benign carcinoid tumor of the small intestine, unspecified portion: Principal | ICD-10-CM

## 2024-06-03 MED ORDER — OCTREOTIDE ACETATE 30 MG IM KIT
30 | Freq: Once | INTRAMUSCULAR | Status: AC
Start: 2024-06-03 — End: 2024-06-03
  Administered 2024-06-03: 17:00:00 30 mg via INTRAMUSCULAR

## 2024-06-03 MED FILL — SANDOSTATIN LAR DEPOT 30 MG IM KIT: 30 mg | INTRAMUSCULAR | Qty: 1 | Fill #0

## 2024-06-03 NOTE — Plan of Care (Signed)
"    Problem: Safety - Adult  Goal: Free from fall injury  Outcome: Completed     "

## 2024-06-03 NOTE — Progress Notes (Signed)
"  Pt here ambulatory for sandostatin  injection  Orders reviewed  Treatment completed without complication  Pt tolerated treatment well  Pt discharged with schedule    "

## 2024-06-13 NOTE — Telephone Encounter (Signed)
"  Name: Monica Wood  DOB: 1975-10-06  MRN: [id number]    Oncology Navigation Follow-Up Note    Contact Type:  Telephone    Notes: Writer called pt to check on her and to see how she's feeling. Pt reports doing well overall and states she finally found her rhythm and is feeling pretty good. Writer encouraged her to reach out with any needs or questions. Pt appreciative of check in call. Will continue to follow.      Electronically signed by Burnard Gaskins, RN on 06/13/2024 at 8:10 AM  "

## 2024-06-21 ENCOUNTER — Ambulatory Visit: Admit: 2024-06-21 | Discharge: 2024-06-21 | Payer: PRIVATE HEALTH INSURANCE | Primary: Nurse Practitioner

## 2024-06-21 VITALS — BP 108/65 | HR 71 | Temp 97.80000°F | Wt 211.0 lb

## 2024-06-21 DIAGNOSIS — R6882 Decreased libido: Principal | ICD-10-CM

## 2024-06-21 MED ORDER — TESTOSTERONE CYPIONATE 200 MG/ML IM SOLN
200 | Freq: Once | INTRAMUSCULAR | Status: AC
Start: 2024-06-21 — End: 2024-06-21
  Administered 2024-06-21: 15:00:00 50 mg via INTRAMUSCULAR

## 2024-06-21 NOTE — Progress Notes (Signed)
 "[facility], [facility]  [facility] [place]. [facility]   [address] [place] [address]  Dept: [phone]  Date: 06/21/2024  Patient Name: Monica Wood    VISIT - FOLLOW UP VISIT     CC: had concerns including Injections (Testerone injection).      HPI:   History of Present Illness  The patient is a 48 year old female who presents for testosterone  injection.    She reports a positive response to the treatment, with no additional concerns at this time. She has been under the care of her gynecologic oncologist, who has addressed a potential issue on her left labia. The oncologist reassured her that there was no cause for concern, per patient.     Past Surgical History:   Procedure Laterality Date    ABDOMINOPLASTY      COLONOSCOPY N/A 11/18/2022    COLONOSCOPY POLYPECTOMY SNARE BIOPSY performed by Agapito Heman, MD at [facility] OR    ENDOMETRIAL ABLATION  2008    ESOPHAGOGASTRIC FUNDOPLICATION  2000    GALLBLADDER SURGERY      LAPAROTOMY N/A 11/19/2022    LAPAROTOMY EXPLORATORY.  SMALL BOWEL RESECTION.  INCISIONAL BIOPSY OF MESENTERIC LYMPH NODE performed by Teresa Landry LABOR, MD at [facility] OR    OTHER SURGICAL HISTORY      ablation    TUBAL LIGATION      UPPER GASTROINTESTINAL ENDOSCOPY N/A 11/18/2022    ESOPHAGOGASTRODUODENOSCOPY BIOPSY performed by Agapito Heman, MD at [facility] OR      Past Medical History:   Diagnosis Date    Anemia     Anxiety and depression     Arthritis     Breast pain     Bursitis     Carcinoid tumor (HCC)     Chronic diarrhea     Depression 2020    Digestive problems     Female genital prolapse, unspecified     Fibromyalgia     Irritable bowel syndrome 2001    Obesity 1985    Osteoarthritis 2019    Retention of urine     Scoliosis     STD (sexually transmitted disease)     Chlamydia in college    Tendinitis     Tension headache     Urinary incontinence     Vitamin D deficiency     Vitamin D  deficiency         Overall state of well-being, dietary and nutritional habits, exercise routines of at least 30 minutes 3 times a week, bowel and bladder function, smoking history, HRT history, sexual activity and partner/s, dyspareunia, and immunizations all revisited if necessary by chart review or direct questioning.    Topics of Prolapse, Pain, Pressure were revisited including type, period of onset, level of severity, quality and quantity, associations, trends, exacerbators, alleviators, bleeding, prolapse to reduce, splinting, and prior treatment / surgery.    Topics of Urinary Leakage were revisited including type, period of onset, level of severity, quality and quantity, associations, trends, exacerbators, alleviators, urgency, frequency, nocturia ,urge incontinence / stress incontinence triggers, hesitancy, obstruction with need to catheterize, frequent UTIs >3 in a year diagnosed by culture, hematuria (gross or microscopic), urinary stones, and prior treatment / surgery.    Topics of Fecal Incontinence were revisited including type, period of onset, level of severity, quality and quantity, associations, trends, exacerbators, alleviators, times per day/week, stool quality / quantity, rectal bleeding, hemorrhoids, constipation /  Anismus / Proctalgia fugax, laxative abuse, and prior treatment / surgery.    Topics of General Gynecology were revisited including gravity/parity, #SVD's, perineal delivery trauma, LMP, days of flow, heaviest days, # pads / tampons per day, cramps, anemia, discharge, prior STI's.    Flowsheet:  Screenings reviewed per Flowsheet including Pap smear, mammogram, dexascan, colonoscopy. Any screenings due will be ordered for this visit if patient agrees.      ROS:  Review of Systems   Gastrointestinal: Negative.    Genitourinary: Negative.    All other systems reviewed and are negative.      All other systems negative.    PHYSICAL EXAM:  Physical Exam  Constitutional:       General:  She is not in acute distress.     Appearance: Normal appearance. She is normal weight. She is not ill-appearing, toxic-appearing or diaphoretic.   HENT:      Head: Normocephalic and atraumatic.   Cardiovascular:      Rate and Rhythm: Normal rate and regular rhythm.   Pulmonary:      Effort: Pulmonary effort is normal.      Breath sounds: Normal breath sounds.   Abdominal:      General: Abdomen is flat. Bowel sounds are normal.   Musculoskeletal:         General: Normal range of motion.      Cervical back: Normal range of motion and neck supple.   Neurological:      Mental Status: She is oriented to person, place, and time.   Skin:     General: Skin is warm and dry.   Psychiatric:         Mood and Affect: Mood normal.         Behavior: Behavior normal.   Vitals and nursing note reviewed.       Objective   Blood pressure 108/65, pulse 71, temperature 97.8 F (36.6 C), temperature source Oral, weight 95.7 kg (211 lb), SpO2 99%.  Physical Exam  General Appearance: Patient appears well and in no acute distress.  Integumentary: Injection site on left side with minimal bleeding, cleaned and bandaged.       ASSESSMENT/PLAN:  Low libido  -     testosterone  cypionate (DEPOTESTOTERONE CYPIONATE) injection 50 mg; 50 mg, IntraMUSCular, ONCE, 1 dose, On Tue 06/21/24 at 1000   PATIENT SUPPLIED    The patient was counseled regarding review of all conditions discussed.     2.   Educational handouts were discussed & given when applicable.     3.   Testing recommendations were given as indicated.     4.   Detailed questionnaires regarding the patient's specific condition were given to the patient when indicated. The patient understands that these are her responsibility to complete and return on a voluntary basis. Reminders to complete the questionnaires will not be given. The patient understands that failure to return the questionnaires may not give the provider a full picture of the patient's urogynecologic issues and make treatment  less than optimal despite the practitioner's best effort to obtain information.     Assessment & Plan  1. Testosterone  injection:  - The patient reports doing well on the current regimen.      Multiple records reviewed. All questions were addressed to the patient's satisfaction.      Point of care: Medical decision making and point of care discussed with the team & supervising physician to qualify as a shared visit when applicable, supervising physician  by proxy provider    Follow up: Return if symptoms worsen or fail to improve.     The patient (or guardian, if applicable) and other individuals in attendance with the patient were advised that Artificial Intelligence will be utilized during this visit to record, process the conversation to generate a clinical note and to support improvement of the AI technology. The patient (or guardian, if applicable) and other individuals in attendance at the appointment consented to the use of AI, including the recording.      An electronic signature was used to authenticate this note.    --Kate Borer, APRN - CNP      "

## 2024-07-01 ENCOUNTER — Ambulatory Visit
Admit: 2024-07-01 | Discharge: 2024-07-01 | Payer: PRIVATE HEALTH INSURANCE | Attending: Internal Medicine | Primary: Nurse Practitioner

## 2024-07-01 ENCOUNTER — Encounter: Payer: PRIVATE HEALTH INSURANCE | Primary: Nurse Practitioner

## 2024-07-01 ENCOUNTER — Inpatient Hospital Stay
Admit: 2024-07-01 | Discharge: 2024-07-01 | Payer: PRIVATE HEALTH INSURANCE | Attending: Internal Medicine | Primary: Nurse Practitioner

## 2024-07-01 VITALS — BP 94/56 | HR 57 | Temp 96.60000°F | Wt 201.9 lb

## 2024-07-01 VITALS — BP 105/65 | HR 82

## 2024-07-01 DIAGNOSIS — D3A019 Benign carcinoid tumor of the small intestine, unspecified portion: Principal | ICD-10-CM

## 2024-07-01 DIAGNOSIS — C7A011 Malignant carcinoid tumor of the jejunum: Principal | ICD-10-CM

## 2024-07-01 MED ORDER — OCTREOTIDE ACETATE 30 MG IM KIT
30 | Freq: Once | INTRAMUSCULAR | Status: AC
Start: 2024-07-01 — End: 2024-07-01
  Administered 2024-07-01: 16:00:00 30 mg via INTRAMUSCULAR

## 2024-07-01 MED FILL — SANDOSTATIN LAR DEPOT 30 MG IM KIT: 30 mg | INTRAMUSCULAR | Qty: 1 | Fill #0

## 2024-07-01 NOTE — Progress Notes (Signed)
"  Pt here ambulatory for sandostatin  injection  Orders reviewed  Pt tolerated treatment well  Pt discharged with schedule  "

## 2024-07-01 NOTE — Progress Notes (Signed)
 "        Monica Wood                                                                                                                  07/01/2024  MRN:   [id number]  Date of Birth:  [birth year]  PCP:                           Cheyenne Raisin, APRN - CNP  Referring Physician: No ref. provider found  Treating Physician Name: LIVIA PENCE, MD      Reason for visit:  Chief Complaint   Patient presents with    Follow-up     Review status of disease.     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     The patient is a 48 year old female with a diagnosis of neuroendocrine tumor of small bowel. She reports overall good health with no new symptoms. The patient receives octreotide  injections every four weeks, which have proven effective in managing her diarrhea. Additionally, she administers short-acting octreotide  monthly, which has been highly efficacious. She has an adequate supply of medication and does not require any refills at this time. The patient is also undergoing hormone replacement therapy and takes iron supplements. She notes that her voice has remained stable and has shown improvement. Furthermore, she finds yoga and meditation to be beneficial for her well-being.    During this visit patient's allergy, social, medical, surgical history and medications were reviewed and updated.    Summary of Case/History:  Monica Wood a 49 y.o.female is a patient with unresectable well-differentiated endocrine tumor of small bowel presents to the clinic to establish care for further workup and management.  Patient complains of having abdominal pain .  Presented to the ER underwent imaging which showed soft tissue mass in the ventral  mesentery anterior to the aorta concerning for neoplasm.  Patient underwent EGD and colonoscopy by GI which showed a 1 cm polyp in the colon but no other significant abnormality.  Subsequently patient was taken to the OR for exploratory laparotomy small bowel resection and lymph node dissection.  Pathology from the surgery showed multiple well-differentiated neuroendocrine tumors largest measuring 2.8 cm in the mid small bowel resection.  There was also metastasis noted to the lymph nodes.  Patient had a grade 1 disease.  Ki-67 was 1-2%.  Pathological stage was T3 N1.  Patient is recovering from the surgery.  She has a PET scan scheduled next week.  Presents to the clinic to establish care and for further workup.  Patient also noted to have microcytic anemia.  Iron studies done earlier show adequate iron stores.  Patient also complains of hoarseness of voice  which has been going on for a while.    Past Medical History:   Past Medical History:   Diagnosis Date    Anemia     Anxiety and depression     Arthritis     Breast pain     Bursitis     Carcinoid tumor (HCC)     Chronic diarrhea     Depression 2020    Digestive problems     Female genital prolapse, unspecified     Fibromyalgia     Irritable bowel syndrome 2001    Obesity 1985    Osteoarthritis 2019    Retention of urine     Scoliosis     STD (sexually transmitted disease)     Chlamydia in college    Tendinitis     Tension headache     Urinary incontinence     Vitamin D deficiency     Vitamin D deficiency      Past Surgical History:  Past Surgical History:   Procedure Laterality Date    ABDOMINOPLASTY      COLONOSCOPY N/A 11/18/2022    COLONOSCOPY POLYPECTOMY SNARE BIOPSY performed by Agapito Heman, MD at [facility] OR    ENDOMETRIAL ABLATION  2008    ESOPHAGOGASTRIC FUNDOPLICATION  2000    GALLBLADDER SURGERY      LAPAROTOMY N/A 11/19/2022    LAPAROTOMY EXPLORATORY.  SMALL BOWEL RESECTION.  INCISIONAL BIOPSY OF MESENTERIC LYMPH NODE performed by Teresa Landry LABOR, MD at [facility] OR    OTHER SURGICAL HISTORY      ablation    TUBAL LIGATION      UPPER GASTROINTESTINAL ENDOSCOPY N/A 11/18/2022    ESOPHAGOGASTRODUODENOSCOPY BIOPSY performed by Agapito Heman, MD at [facility]     Patient Family Social History:  Family History   Problem Relation Age of Onset    Diabetes Maternal Grandmother     Diabetes Father     Diabetes Mother     Allergy (Severe) Mother     Anemia Mother     Arthritis Mother     Depression Mother     Other Mother         ADHD    Breast Cancer Neg Hx     Ovarian Cancer Neg Hx     Endometrial Cancer Neg Hx     Uterine Cancer Neg Hx     Cervical Cancer Neg Hx     Vaginal Cancer Neg Hx     Colon Cancer Neg Hx      Social History     Tobacco Use    Smoking status: Never    Smokeless tobacco: Never   Vaping Use    Vaping status: Never Used   Substance Use Topics    Alcohol use: Not Currently     Alcohol/week: 4.0 standard drinks of alcohol     Types: 4 Glasses of wine per week     Comment: 3/4 times a week    Drug use: Yes     Frequency: 2.0 times per week     Comment: uses gummies periodically for pain control     Current Medications:  Current Outpatient Medications   Medication Sig Dispense Refill    estradiol  (VIVELLE ) 0.0375 MG/24HR APPLY 1 PATCH TOPICALLY TO THE SKIN 2 TIMES A WEEK 8 patch 3    lipase-protease-amylase (CREON ) 36000-114000 units CPEP delayed release capsule 1 capsule PO TID with meals 270 capsule 3    famotidine  (PEPCID ) 40 MG  tablet Take 1 tablet by mouth nightly as needed (heartburn) 90 tablet 3    fluticasone  (FLONASE ) 50 MCG/ACT nasal spray 1 spray by Nasal route daily 3 each 1    octreotide  (SANDOSTATIN ) 50 MCG/ML SOLN INJECT 1 ML (50 MCG) UNDER THE SKIN EVERY 8 HOURS AS NEEDED (DIARRHEA) (DISCARD UNUSED PORTION) 270 mL 0    Multiple Vitamin (MULTIVITAMIN) TABS tablet Take 1 tablet by mouth daily      Ferrous Sulfate  (IRON SLOW RELEASE PO) Take 1 tablet by mouth daily      Fexofenadine HCl (ALLEGRA ALLERGY PO) Take 1 tablet  by mouth daily      buPROPion  (WELLBUTRIN  XL) 150 MG extended release tablet Take 1 tablet by mouth every morning 90 tablet 1    progesterone  (PROMETRIUM ) 100 MG CAPS capsule Take 1 capsule by mouth daily 90 capsule 1    albuterol  sulfate HFA (PROVENTIL  HFA) 108 (90 Base) MCG/ACT inhaler Inhale 2 puffs into the lungs every 6 hours as needed for Wheezing or Shortness of Breath 18 g 2    DULoxetine  (CYMBALTA ) 60 MG extended release capsule Take 1 capsule by mouth daily 90 capsule 1    PREBIOTIC PRODUCT PO Take by mouth      Lisdexamfetamine Dimesylate 40 MG CAPS Take 40 mg by mouth daily.      NONFORMULARY Indications: allegera      Calcium Carbonate-Vitamin D (OYSTER SHELL CALCIUM/D) 500-5 MG-MCG TABS Take 1 tablet by mouth (Patient not taking: Reported on 07/01/2024)      vitamin E 400 UNIT capsule Take 1 capsule by mouth daily (Patient not taking: Reported on 07/01/2024)      Cholecalciferol (VITAMIN D3) 1.25 MG (50000 UT) CAPS Take by mouth (Patient not taking: Reported on 07/01/2024)      ferrous sulfate  (IRON 325) 325 (65 Fe) MG tablet Take 1 tablet by mouth daily (with breakfast) (Patient not taking: Reported on 07/01/2024) 90 tablet 1    tiZANidine  (ZANAFLEX ) 2 MG tablet Take 1 tablet by mouth daily as needed (back muscle) (Patient not taking: Reported on 07/01/2024) 30 tablet 1     No current facility-administered medications for this visit.     Allergies:   Oxycodone, Percocet [oxycodone-acetaminophen ], Soybean-containing drug products, Codeine, Morphine , and Parsley leaves    Review of Systems:    Constitutional: No fever or chills. No night sweats, no weight loss   Eyes: No eye discharge, double vision, or eye pain   HEENT: negative for sore mouth, sore throat, hoarseness and voice change   Respiratory: negative for cough , sputum, dyspnea, wheezing, hemoptysis, chest pain   Cardiovascular: negative for chest pain, dyspnea, palpitations, orthopnea, PND   Gastrointestinal: negative for nausea, vomiting,  constipation, abdominal pain, Dysphagia, hematemesis and hematochezia.  Positive for abdominal discomfort stable diarrhea improved  Genitourinary: negative for frequency, dysuria, nocturia, urinary incontinence, and hematuria   Integument: negative for rash, skin lesions, bruises.   Hematologic/Lymphatic: negative for easy bruising, bleeding, lymphadenopathy, or petechiae   Endocrine: negative for heat or cold intolerance,weight changes, change in bowel habits and hair loss   Musculoskeletal: negative for myalgias, arthralgias, pain, joint swelling,and bone pain   Neurological: negative for headaches, dizziness, seizures, weakness, numbness    Physical Exam:  Vitals: BP (!) 94/56   Pulse 57   Temp (!) 96.6 F (35.9 C)   Wt 91.6 kg (201 lb 14.4 oz)   SpO2 98%   BMI 34.64 kg/m   General appearance - well appearing, no in pain  or distress  Mental status - AAO X3  Eyes - pupils equal and reactive, extraocular eye movements intact  Mouth - mucous membranes moist, pharynx normal without lesions  Neck - supple, no significant adenopathy  Lymphatics - no palpable lymphadenopathy, no hepatosplenomegaly  Chest - clear to auscultation, no wheezes, rales or rhonchi, symmetric air entry  Heart - normal rate, regular rhythm, normal S1, S2, no murmurs  Abdomen - soft, nontender, nondistended, no masses or organomegaly.  Positive healing surgical incision site  Neurological - alert, oriented, normal speech, no focal findings or movement disorder noted  Extremities - peripheral pulses normal, no pedal edema, no clubbing or cyanosis  Skin - normal coloration and turgor, no rashes, no suspicious skin lesions noted     DATA:  Results for orders placed or performed during the hospital encounter of 03/23/24   Vaginitis DNA Probe    Specimen: Vagina   Result Value Ref Range    Source .VAGINAL SWAB     Trichomonas NEGATIVE NEGATIVE    GARDNERELLA VAGINALIS NEGATIVE NEGATIVE    Candida species NEGATIVE NEGATIVE     No results  found.    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  Reviewed records from outside facility.  Reviewed CT PET dotatate overall stable disease  Patient will use short acting octreotide  for symptom management  Pt seen at [facility] , disease unresectable. Continue octreotide .  Will call in short acting octreotide  for symptom management  Patient is currently on octreotide  LAR 30 mg every 4 weeks.  Discussed natural history of well-differentiated neuroendocrine tumor.  Patient feeling much better with HRT  Will need surveillance for IPMN.  Will repeat MRI in 1 year, 06/02/2024  Continue oral iron supplementation. Still microcytic anemia . 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  HRT will not interfere treatment with patient's neuroendocrine tumor.  Okay to take from oncology standpoint  Continue exercises for the vocal cord for voice changes  NCCN guidelines were reviewed and discussed with the patient.  Hemoglobin electrophoresis unremarkable however alpha thalassemia not ruled out  The diagnosis and care plan were discussed with the patient in detail. I discussed the natural history of the disease, prognosis, risks and goals of therapy and answered all the patients questions to the best of my ability.  Patient expressed understanding and was in agreement.    Assessment & Plan  1.  Neuroendocrine tumor small bowel  - On octreotide  long-acting 30 mg every 4 weeks and short-acting octreotide  as needed, effective  - On hormone replacement therapy and iron supplements  - MRI (03/2024): Pancreatic cyst, follow-up MRI needed  - PET scan scheduled for 09/2024  - Labs today  - Next  octreotide  injection 07/19/2024, subsequent doses in February 2026  - Postpone octreotide  until after CT PET scan in 09/2024  - Continue current regimen and lifestyle modifications    2.  Diarrhea  -Better controlled with short acting octreotide  injections.    Follow-up  - After CT PET scan        LIVIA PENCE, MD    This note is created with the assistance of a speech recognition program.  While intending to generate a document  that actually reflects the content of the visit, the document can still have some errors including those of syntax and sound a like substitutions which may escape proof reading.  It such instances, actual meaning can be extrapolated by contextual diversion.    "

## 2024-07-01 NOTE — Plan of Care (Signed)
"    Problem: Safety - Adult  Goal: Free from fall injury  Outcome: Completed     "

## 2024-07-01 NOTE — Telephone Encounter (Signed)
"  Labs now  Mri before rv   Ct pet in march , hold octreotide  shot in march until ct pet is done   Rv after ct pet with labs           MRI on 07/23/24 @830a   PET on 09/23/24 @830a   RV on 09/28/24 @1130a   "

## 2024-07-01 NOTE — Patient Instructions (Signed)
"  Labs now  Mri before rv   Ct pet in march , hold octreotide  shot in march until ct pet is done   Rv after ct pet with labs   "

## 2024-07-04 ENCOUNTER — Inpatient Hospital Stay: Payer: PRIVATE HEALTH INSURANCE | Attending: Internal Medicine | Primary: Nurse Practitioner

## 2024-07-04 DIAGNOSIS — C7A011 Malignant carcinoid tumor of the jejunum: Principal | ICD-10-CM

## 2024-07-04 LAB — COMPREHENSIVE METABOLIC PANEL
ALT: 44 U/L — ABNORMAL HIGH (ref 10–35)
AST: 30 U/L (ref 10–35)
Albumin/Globulin Ratio: 1.5 (ref 1.0–2.5)
Albumin: 4.2 g/dL (ref 3.5–5.2)
Alkaline Phosphatase: 124 U/L — ABNORMAL HIGH (ref 35–104)
Anion Gap: 10 mmol/L (ref 9–16)
BUN: 10 mg/dL (ref 6–20)
CO2: 23 mmol/L (ref 20–31)
Calcium: 10.3 mg/dL (ref 8.6–10.4)
Chloride: 105 mmol/L (ref 98–107)
Creatinine: 0.8 mg/dL (ref 0.6–0.9)
Est, Glom Filt Rate: >90 mL/min/1.73m2 (ref >60)
Glucose: 72 mg/dL — ABNORMAL LOW (ref 74–99)
Potassium: 4 mmol/L (ref 3.7–5.3)
Sodium: 138 mmol/L (ref 136–145)
Total Bilirubin: 0.3 mg/dL (ref 0.0–1.2)
Total Protein: 7 g/dL (ref 6.6–8.7)

## 2024-07-04 LAB — CBC WITH AUTO DIFFERENTIAL
Basophils %: 2 % (ref 0–2)
Basophils Absolute: 0.1 k/uL (ref 0.0–0.2)
Eosinophils %: 2 % (ref 1–4)
Eosinophils Absolute: 0.1 k/uL (ref 0.0–0.4)
Hematocrit: 38.5 % (ref 36–46)
Hemoglobin: 12.1 g/dL (ref 12.0–16.0)
Lymphocytes %: 43 % (ref 24–44)
Lymphocytes Absolute: 2.5 k/uL (ref 1.0–4.8)
MCH: 23.4 pg — ABNORMAL LOW (ref 26–34)
MCHC: 31.4 g/dL (ref 31–37)
MCV: 74.6 fL — ABNORMAL LOW (ref 80–100)
MPV: 7.6 fL (ref 6.0–12.0)
Monocytes %: 12 % — ABNORMAL HIGH (ref 2–11)
Monocytes Absolute: 0.7 k/uL (ref 0.1–1.2)
Neutrophils %: 41 % (ref 36–66)
Neutrophils Absolute: 2.4 k/uL (ref 1.8–7.7)
Platelets: 359 k/uL (ref 140–450)
RBC: 5.16 m/uL (ref 4.0–5.2)
RDW: 14.6 % (ref 12.5–15.4)
WBC: 5.9 k/uL (ref 3.5–11.0)

## 2024-07-05 LAB — IRON AND TIBC
Iron % Saturation: 31 % (ref 20–55)
Iron: 99 ug/dL (ref 37–145)
TIBC: 317 ug/dL (ref 250–450)
UIBC: 218 ug/dL (ref 112–347)

## 2024-07-05 LAB — FERRITIN: Ferritin: 229 ng/mL — ABNORMAL HIGH (ref 15–150)

## 2024-07-05 LAB — VITAMIN B12 & FOLATE
Folate: >40 ng/mL — ABNORMAL HIGH (ref 4.8–24.2)
Vitamin B-12: 1114 pg/mL (ref 232–1245)

## 2024-07-18 ENCOUNTER — Ambulatory Visit
Admit: 2024-07-18 | Discharge: 2024-07-18 | Payer: PRIVATE HEALTH INSURANCE | Attending: Gastroenterology | Primary: Nurse Practitioner

## 2024-07-18 VITALS — BP 106/74 | HR 66 | Temp 98.10000°F | Resp 16 | Wt 204.0 lb

## 2024-07-18 DIAGNOSIS — D3A019 Benign carcinoid tumor of the small intestine, unspecified portion: Principal | ICD-10-CM

## 2024-07-18 NOTE — Progress Notes (Signed)
 "      GI CLINIC FOLLOW UP    INTERVAL HISTORY:   No referring provider defined for this encounter.    Chief Complaint   Patient presents with    Follow-up     4 month follow up           HISTORY OF PRESENT ILLNESS: Ms.Jessicamarie Strider is a 49 y.o. female , referred for evaluation of  . Carcinoid tumor of small intestine, unspecified location, unspecified whether malignant (HCC)          2. Pancreatic insufficiency          3. IPMN (intraductal papillary mucinous neoplasm)          4. LFT elevation          5. 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 Ct showed partial SBO due to mesenteric mass. Mass was not previously seen on MRI abd 08/14/22. Tumor pathology significant for multiple well-differentiated NETs extending through the muscularis propria into subserosal tissue without penetration of the overlying serosa with local lymph node involvement. CEA, CA125, CA19-9, chromagranin A wnl 11/17/22.     Continuing to follow with Dr Tamela  Who referred the to [facility] clinic for second opinion on surgery as the patient was deemed not to be operable by our surgeons, the referral was made on July 17, 2023 by Dr. Tamela  Continuing octreotide  and referred to a surgical oncology team for 2nd opinion about resection of mesenteric mass     Still following with [facility]    Today has some pain in rt flank    Scheduled to have the MRI , this coming Sat   And Pet scan in 3/26     On Sandostatin  SQ  30 mg every 4 weeks and short-acting octreotide  as needed,                   Past Medical,Family, and Social History reviewed and does contribute to the patient presentingcondition.    I did review all the labs results available for the labs which were ordered by the primary care physician, and the other consultants, we search on epic at [facility] and all the available  care everywhere epic    I did review all the imaging studies of the abdomen available on EMR, ordered by the primary care physician and the other consultant    I did review all the pathology from the biopsies done on the previous endoscopies    Patient's PMH/PSH,SH,PSYCH Hx, MEDs, ALLERGIES, and ROS were all reviewed and updated in the appropriate sections.    PAST MEDICAL HISTORY:  Past Medical History:   Diagnosis Date    Anemia     Anxiety and depression     Arthritis     Breast pain     Bursitis     Carcinoid tumor (HCC)     Chronic diarrhea     Depression 2020    Digestive problems     Female genital prolapse, unspecified     Fibromyalgia     Irritable bowel syndrome 2001    Obesity 1985    Osteoarthritis 2019    Retention of urine     Scoliosis     STD (sexually transmitted disease)     Chlamydia in college    Tendinitis  Tension headache     Urinary incontinence     Vitamin D deficiency     Vitamin D deficiency        Past Surgical History:   Procedure Laterality Date    ABDOMINOPLASTY      COLONOSCOPY N/A 11/18/2022    COLONOSCOPY POLYPECTOMY SNARE BIOPSY performed by Agapito Heman, MD at [facility] [place]    ENDOMETRIAL ABLATION  2008    ESOPHAGOGASTRIC FUNDOPLICATION  2000    GALLBLADDER SURGERY      LAPAROTOMY N/A 11/19/2022    LAPAROTOMY EXPLORATORY.  SMALL BOWEL RESECTION.  INCISIONAL BIOPSY OF MESENTERIC LYMPH NODE performed by Teresa Landry LABOR, MD at [facility] OR    OTHER SURGICAL HISTORY      ablation    TUBAL LIGATION      UPPER GASTROINTESTINAL ENDOSCOPY N/A 11/18/2022    ESOPHAGOGASTRODUODENOSCOPY BIOPSY performed by Agapito Heman, MD at [facility] [place] [facility]       CURRENT MEDICATIONS:    Current Outpatient Medications:     estradiol  (VIVELLE ) 0.0375 MG/24HR, APPLY 1 PATCH TOPICALLY TO THE SKIN 2 TIMES A WEEK, Disp: 8 patch, Rfl: 3    lipase-protease-amylase (CREON ) 36000-114000 units CPEP delayed release capsule, 1 capsule PO TID with meals, Disp: 270 capsule, Rfl: 3    famotidine  (PEPCID ) 40  MG tablet, Take 1 tablet by mouth nightly as needed (heartburn), Disp: 90 tablet, Rfl: 3    fluticasone  (FLONASE ) 50 MCG/ACT nasal spray, 1 spray by Nasal route daily, Disp: 3 each, Rfl: 1    octreotide  (SANDOSTATIN ) 50 MCG/ML SOLN, INJECT 1 ML (50 MCG) UNDER THE SKIN EVERY 8 HOURS AS NEEDED (DIARRHEA) (DISCARD UNUSED PORTION), Disp: 270 mL, Rfl: 0    Multiple Vitamin (MULTIVITAMIN) TABS tablet, Take 1 tablet by mouth daily, Disp: , Rfl:     Ferrous Sulfate  (IRON SLOW RELEASE PO), Take 1 tablet by mouth daily, Disp: , Rfl:     Fexofenadine HCl (ALLEGRA ALLERGY PO), Take 1 tablet by mouth daily, Disp: , Rfl:     buPROPion  (WELLBUTRIN  XL) 150 MG extended release tablet, Take 1 tablet by mouth every morning, Disp: 90 tablet, Rfl: 1    progesterone  (PROMETRIUM ) 100 MG CAPS capsule, Take 1 capsule by mouth daily, Disp: 90 capsule, Rfl: 1    albuterol  sulfate HFA (PROVENTIL  HFA) 108 (90 Base) MCG/ACT inhaler, Inhale 2 puffs into the lungs every 6 hours as needed for Wheezing or Shortness of Breath, Disp: 18 g, Rfl: 2    DULoxetine  (CYMBALTA ) 60 MG extended release capsule, Take 1 capsule by mouth daily, Disp: 90 capsule, Rfl: 1    PREBIOTIC PRODUCT PO, Take by mouth, Disp: , Rfl:     Lisdexamfetamine Dimesylate 40 MG CAPS, Take 40 mg by mouth daily., Disp: , Rfl:     Calcium Carbonate-Vitamin D (OYSTER SHELL CALCIUM/D) 500-5 MG-MCG TABS, Take 1 tablet by mouth (Patient not taking: Reported on 07/18/2024), Disp: , Rfl:     vitamin E 400 UNIT capsule, Take 1 capsule by mouth daily (Patient not taking: Reported on 07/18/2024), Disp: , Rfl:     Cholecalciferol (VITAMIN D3) 1.25 MG (50000 UT) CAPS, Take by mouth (Patient not taking: Reported on 07/18/2024), Disp: , Rfl:     ALLERGIES:   Allergies   Allergen Reactions    Oxycodone Anaphylaxis    Percocet [Oxycodone-Acetaminophen ] Shortness Of Breath    Soybean-Containing Drug Products Anaphylaxis    Codeine      Anaphylaxis     Morphine   Parsley Leaves        FAMILY HISTORY:        Problem Relation Age of Onset    Diabetes Maternal Grandmother     Diabetes Father     Diabetes Mother     Allergy (Severe) Mother     Anemia Mother     Arthritis Mother     Depression Mother     Other Mother         ADHD    Breast Cancer Neg Hx     Ovarian Cancer Neg Hx     Endometrial Cancer Neg Hx     Uterine Cancer Neg Hx     Cervical Cancer Neg Hx     Vaginal Cancer Neg Hx     Colon Cancer Neg Hx          SOCIAL HISTORY:   Social History     Socioeconomic History    Marital status: Married     Spouse name: Not on file    Number of children: Not on file    Years of education: Not on file    Highest education level: Not on file   Occupational History    Not on file   Tobacco Use    Smoking status: Never    Smokeless tobacco: Never   Vaping Use    Vaping status: Never Used   Substance and Sexual Activity    Alcohol use: Not Currently     Alcohol/week: 4.0 standard drinks of alcohol     Types: 4 Glasses of wine per week     Comment: 3/4 times a week    Drug use: Yes     Frequency: 2.0 times per week     Comment: uses gummies periodically for pain control    Sexual activity: Yes     Partners: Male     Comment: single partner - spouse   Other Topics Concern    Not on file   Social History Narrative    Not on file     Social Drivers of Health     Financial Resource Strain: Low Risk  (12/01/2022)    Overall Financial Resource Strain (CARDIA)     Difficulty of Paying Living Expenses: Not hard at all   Food Insecurity: No Food Insecurity (02/14/2024)    Received from [facility]    Hunger Screening     Within the past 12 months we worried whether our food would run out before we got money to buy more.: Never True     Within the past 12 months the food we bought just didn't last and we didn't have money to get more.: Never True   Transportation Needs: No Transportation Needs (01/28/2024)    PRAPARE - Therapist, Art (Medical): No     Lack of Transportation (Non-Medical): No   Physical  Activity: Insufficiently Active (02/16/2022)    Exercise Vital Sign     Days of Exercise per Week: 4 days     Minutes of Exercise per Session: 30 min   Stress: Not on file   Social Connections: Not on file   Intimate Partner Violence: Not At Risk (02/16/2022)    Humiliation, Afraid, Rape, and Kick questionnaire     Fear of Current or Ex-Partner: No     Emotionally Abused: No     Physically Abused: No     Sexually Abused: No   Housing Stability: Low Risk  (01/28/2024)  Housing Stability Vital Sign     Unable to Pay for Housing in the Last Year: No     Number of Times Moved in the Last Year: 0     Homeless in the Last Year: No       REVIEW OF SYSTEMS: A 12-point review of systemswas obtained and pertinent positives and negatives were enumerated above in the history of present illness. All other reviewed systems / symptoms were negative.    Review of Systems   Constitutional:  Negative for appetite change, fatigue and unexpected weight change.   HENT:  Negative for sore throat, trouble swallowing and voice change.    Respiratory:  Negative for cough, choking and wheezing.    Cardiovascular:  Negative for chest pain, palpitations and leg swelling.   Gastrointestinal:  Negative for abdominal distention, abdominal pain, anal bleeding, blood in stool, constipation, diarrhea, nausea, rectal pain and vomiting.   Genitourinary:  Negative for flank pain.   Allergic/Immunologic: Negative for food allergies and immunocompromised state.   Neurological:  Negative for dizziness, speech difficulty, weakness, light-headedness and headaches.   Hematological:  Does not bruise/bleed easily.   Psychiatric/Behavioral:  Negative for sleep disturbance.            LABORATORY DATA: Reviewed  Lab Results   Component Value Date    WBC 5.9 07/04/2024    HGB 12.1 07/04/2024    HCT 38.5 07/04/2024    MCV 74.6 (L) 07/04/2024    PLT 359 07/04/2024    NA 138 07/04/2024    K 4.0 07/04/2024    CL 105 07/04/2024    CO2 23 07/04/2024    BUN 10 07/04/2024     CREATININE 0.8 07/04/2024    BILITOT 0.3 07/04/2024    ALKPHOS 124 (H) 07/04/2024    AST 30 07/04/2024    ALT 44 (H) 07/04/2024    INR <0.8 05/04/2023         Lab Results   Component Value Date    RBC 5.16 07/04/2024    HGB 12.1 07/04/2024    MCV 74.6 (L) 07/04/2024    MCH 23.4 (L) 07/04/2024    MCHC 31.4 07/04/2024    RDW 14.6 07/04/2024    MPV 7.6 07/04/2024    BASOPCT 2 07/04/2024    LYMPHSABS 2.50 07/04/2024    MONOSABS 0.70 07/04/2024    NEUTROABS 2.40 07/04/2024    EOSABS 0.10 07/04/2024    BASOSABS 0.10 07/04/2024         DIAGNOSTIC TESTING:     No results found.       PHYSICAL EXAMINATION: Vital signs reviewed per the nursing documentation.     BP 106/74   Pulse 66   Temp 98.1 F (36.7 C) (Tympanic)   Resp 16   Wt 92.5 kg (204 lb)   SpO2 99%   BMI 35.00 kg/m   Body mass index is 35 kg/m.   Physical Exam  Vitals and nursing note reviewed.   Constitutional:       General: She is not in acute distress.     Appearance: She is well-developed. She is not diaphoretic.   HENT:      Head: Normocephalic.      Mouth/Throat:      Pharynx: No oropharyngeal exudate.   Eyes:      General: No scleral icterus.     Pupils: Pupils are equal, round, and reactive to light.   Neck:      Thyroid: No thyromegaly.  Vascular: No JVD.      Trachea: No tracheal deviation.   Cardiovascular:      Rate and Rhythm: Normal rate and regular rhythm.      Heart sounds: Normal heart sounds. No murmur heard.  Pulmonary:      Effort: Pulmonary effort is normal. No respiratory distress.      Breath sounds: Normal breath sounds. No wheezing.   Abdominal:      General: Bowel sounds are normal. There is no distension.      Palpations: Abdomen is soft.      Tenderness: There is no abdominal tenderness. There is no guarding or rebound.      Comments: No ascites   Musculoskeletal:         General: Normal range of motion.      Cervical back: Normal range of motion and neck supple.   Skin:     General: Skin is warm.      Coloration: Skin is  not pale.      Findings: No erythema or rash.      Comments: She is not diaphoretic   Neurological:      Mental Status: She is alert and oriented to person, place, and time.      Deep Tendon Reflexes: Reflexes are normal and symmetric.   Psychiatric:         Behavior: Behavior normal.         Thought Content: Thought content normal.         Judgment: Judgment normal.           IMPRESSION: Ms. Nayak is a 49 y.o. female with      Diagnosis Orders   1. Carcinoid tumor of small intestine, unspecified location, unspecified whether malignant (HCC)        2. Pancreatic divisum        3. IPMN (intraductal papillary mucinous neoplasm)        4. Pancreatic insufficiency        5. Diarrhea, unspecified type        6. Anemia, unspecified type          Will be on stand by   Follow with Dr Tamela and [facility] to avoid redundant orders    Let me know if any changes       Medication where reviewed, side effects from the GI medication were reviewed with the patient  We did refill the GI medications            Diet/life style/natural hx /complication of the dx were all explained in details   Past medical, past surgical, social history, psychiatric history, medications or allergies, all reviewed and  updated    Thank you for allowing me to participate in the care of Ms. Devore. For any further questions please do not hesitate to contact me.    I have reviewed and agree with the ROS entered by the MA/RN.     Note is dictated utilizing voice recognition software. Unfortunately this leads to occasional typographical errors. Please contact our office if you have any questions.  This note is created with the assistance of the speech recognition program. While intending to generate a document that actually reflects the content of the visit, it can still have some errors including those of syntax and sound-a-like substitutions which may escape proof reading. Actual meaning can be extrapolated by contextual inference.    Bethene Rhodes, MD  [facility]  O: [phone]      "

## 2024-07-23 ENCOUNTER — Inpatient Hospital Stay: Admit: 2024-07-23 | Payer: PRIVATE HEALTH INSURANCE | Attending: Internal Medicine | Primary: Nurse Practitioner

## 2024-07-23 ENCOUNTER — Ambulatory Visit: Payer: PRIVATE HEALTH INSURANCE | Primary: Nurse Practitioner

## 2024-07-23 DIAGNOSIS — C7A011 Malignant carcinoid tumor of the jejunum: Principal | ICD-10-CM

## 2024-07-23 MED ORDER — NORMAL SALINE FLUSH 0.9 % IV SOLN
0.9 | Freq: Once | INTRAVENOUS | Status: AC
Start: 2024-07-23 — End: 2024-07-23
  Administered 2024-07-23: 13:00:00 10 mL via INTRAVENOUS

## 2024-07-23 MED ORDER — GADOTERIDOL 279.3 MG/ML IV SOLN
279.3 | Freq: Once | INTRAVENOUS | Status: AC | PRN
Start: 2024-07-23 — End: 2024-07-23
  Administered 2024-07-23: 13:00:00 19 mL via INTRAVENOUS

## 2024-07-23 MED ORDER — SODIUM CHLORIDE 0.9 % IV BOLUS
0.9 | Freq: Once | INTRAVENOUS | Status: AC
Start: 2024-07-23 — End: 2024-07-23
  Administered 2024-07-23: 13:00:00 40 mL via INTRAVENOUS

## 2024-07-26 NOTE — Telephone Encounter (Signed)
 Name: Monica Wood  DOB: 1975-11-02  MRN: [id number]    Oncology Navigation Follow-Up Note    Contact Type:  Telephone    Notes: Writer called pt to check on her and to see how she's feeling. Pt reports doing very well and states her recent MRI was stable. Writer encouraged her to reach out with any needs, if not, Id f/u with her after her PET in March. Pt appreciative of check in call.       Electronically signed by Burnard Gaskins, RN on 07/26/2024 at 8:11 AM

## 2024-07-27 ENCOUNTER — Ambulatory Visit
Admit: 2024-07-27 | Discharge: 2024-07-27 | Payer: PRIVATE HEALTH INSURANCE | Attending: Physician Assistant | Primary: Nurse Practitioner

## 2024-07-27 ENCOUNTER — Ambulatory Visit: Admit: 2024-07-27 | Discharge: 2024-07-27 | Payer: PRIVATE HEALTH INSURANCE | Primary: Nurse Practitioner

## 2024-07-27 DIAGNOSIS — M79672 Pain in left foot: Principal | ICD-10-CM

## 2024-07-27 NOTE — Progress Notes (Signed)
 [facility]  [address]  [place] [address]  Dept Phone: [phone]  Dept Fax: [phone]      Orthopaedic Trauma Clinic Follow Up      Subjective:     Monica Wood is a 49 y.o. year old female who presents to the clinic today for follow up chronic left hip pain and evaluation of increasing left ankle pain.    Left hip pain:  Patient reports increasing pain over the lateral aspect of the left hip.  She was last seen on 01/20/2024 for bilateral low back and bilateral hip pain.  Prior to that on 11/25/2023 she did undergo a left trochanteric bursal injection which provided significant relief of her pain.  She states over the last couple months she has noted increasing pain the lateral aspect of the left hip and is requesting repeat injection.  She denies any interval injury, trauma or fall.    Left ankle pain:  Patient reports approximately 32-month history of lateral left foot and ankle pain.  She does not recall 1 specific injury or trauma.  She states over the last several months as the pain increases she has found increasing difficulty with eversion of the ankle.  She notes associated numbness over the dorsal lateral aspect of the left foot that is typically present with any prolonged period of activity.  She does not recall 1 specific injury or trauma she denies any history significant for recurrent sprains or fractures of the left ankle in the past.    Review of Systems  Gen: no fever, chills, malaise  CV: no chest pain or palpitations  Resp: no cough or shortness of breath  GI: no nausea, vomiting, diarrhea, or constipation  Neuro: no seizures, vertigo, or headache  Msk: Left hip, left ankle pain  10 remaining systems reviewed and negative    Objective :   There were no vitals filed for this visit.Body mass index is 34.5 kg/m.  General: No acute distress, resting comfortably in the clinic  Neuro: alert. oriented  Eyes: Extra-ocular muscles intact  Pulm: Respirations  unlabored and regular.  Skin: warm, well perfused  Psych:   Patient has good fund of knowledge and displays understanding of exam, diagnosis, and plan.  Left Lower Extremity: Moderate TTP over the left greater trochanteric region.  Positive Ober's.  Negative Stinchfield's, negative logroll.  Full passive range of motion of the left hip.  Ankle examination reveals moderate TTP along the path of the peroneals posterior to the distal fibula.  No direct tenderness to the lateral medial malleolus.  No tenderness to the Achilles.  3/5 strength with resisted eversion but 5/5 strength with plantar and dorsiflexion.. Compartments soft/compressible. Extremity warm/well perfused. EHL/FHL/TA/GSC motor intact. Patient expresses normal sensation L3-S1 distribution     Radiology:  Imaging studies from today were independently reviewed and read as listed below. Any relevant images obtained prior to today's visit were also independently interpreted.        History: 49 year old female with 7-month history of left foot and ankle pain    Comparison: None    Findings: 3 views of the left foot (AP/oblique/lateral) in a skeletally mature patient showing evidence of slight pes planovalgus deformity.  No evidence of acute fracture, subluxation or dislocation no significant tibiotalar or subtalar DJD noted on available views.    Impression: Age-appropriate x-rays without evidence of acute osseous abnormality.  Small calcaneal heel spur     Assessment:   49 y.o. year old female  left trochanteric bursitis  - Left peroneal tendinitis  Plan:   Lengthy discussion had with patient about current clinical state.   -Patient with recurrence of pain over the lateral left hip consistent with trochanteric bursitis.  She has responded very well to trochanteric bursal injection in the past we requesting repeat injections today  - Repeat Depo-Medrol  injection given over the lateral aspect of the left hip.  Patient tolerated injection well educated  postinjection care  - Examination of the left ankle most consistent with peroneal tendinopathy   -We discussed treatment options regards to her left ankle and after discussion elected proceed conservatively with home exercise program, stabilization with a ankle brace which was provided today  - We did offer formal physical therapy referral but patient reports she has an upcoming trip next month would like to try home exercises but would consider PT upon her return from her vacation        Electronically signed by Arthea Stager, PA on 07/27/2024 at 2:32 PM    This note is created with the assistance of a speech recognition program.  While intending to generate a document that actually reflects the content of the visit, the document can still have some errors including those of syntax and sound a like substitutions which may escape proof reading.  In such instances, actual meaning can be extrapolated by contextual diversion

## 2024-07-27 NOTE — Patient Instructions (Addendum)
 "Patient Education        Trochanteric Bursitis: Exercises  Introduction  Here are some examples of exercises for you to try. The exercises may be suggested for a condition or for rehabilitation. Start each exercise slowly. Ease off the exercises if you start to have pain.  You will be told when to start these exercises and which ones will work best for you.  How to do the exercises  Hamstring stretch in a doorway    Sit on the floor close to a doorway. Be sure to stretch your affected leg first.  Lie down with your other leg through the doorway.  Slide your affected leg up the wall to straighten your knee. Don't point your toes. You should feel a gentle stretch down the back of your leg. Be sure to:  Hold the stretch for at least 1 minute. Then over time, try to lengthen the time you hold the stretch to as long as 6 minutes.  Repeat 2 to 4 times.  It's a good idea to repeat these steps with your other leg.  To stretch your right leg, scoot to the right side of the doorway.  To stretch your left leg, scoot to the left side of the doorway.  Keep both knees straight.  Keep your back flat and your other heel on the floor.  If you do not have a place to do this exercise in a doorway, there is another way to do it:  Lie on your back, and bend the knee of your affected leg.  Loop a towel under the ball and toes of that foot, and hold the ends of the towel in your hands.  Straighten your knee as you raise that foot into the air. Slowly pull back on the towel. You should feel a gentle stretch down the back of your leg.  Hold the stretch for 15 to 30 seconds. Or even better, hold the stretch for 1 minute if you can.  Repeat 2 to 4 times.  It's a good idea to repeat these steps with your other leg.  Hip abduction (lying on side)    Lie on your side, with your affected leg on top. You can use your hand or a pillow to support your head.  Keep your knee straight and your leg in a straight line with your body.  Lift your affected  leg straight up toward the ceiling, about 12 inches off the floor. Hold for about 6 seconds, then slowly lower your leg.  Repeat 8 to 12 times.  It's a good idea to repeat these steps on your other side.  Keep your kneecap pointing forward.  Don't let your hip drop back.  Clamshell for hip problems    Lie on your side, with your affected leg on top and your head propped on a pillow. Keep your feet and knees together and your knees bent.  Raise your top knee, but keep your feet together. Do not let your hips roll back. Your legs should open up like a clamshell.  Hold for about 6 seconds.  Slowly lower your knee back down.  Repeat 8 to 12 times.  Quad stretch (standing)    If you are steady on your feet, stand while holding onto a chair or counter or while resting your hand on a wall. You can also lie on your stomach or your side to do this exercise.  Bend the knee of the leg you want to stretch, and then reach  back with the hand on the same side and grab the front of your foot. For example, if you're stretching your right leg, use your right hand.  Keeping your knees next to each other, pull your foot toward your buttock until you feel a gentle stretch across the front of your hip and down the front of your thigh. Your knee should be pointed directly to the ground, not out to the side.  Hold the stretch for at least 15 to 30 seconds.  Repeat 2 to 4 times for each leg.  Piriformis stretch (from legs straight)    Lie on your back with your legs straight.  Lift your affected leg and bend your knee. With your opposite hand, gently pull your knee toward your opposite shoulder. You should feel the stretch in your buttock and hip.  Hold the stretch for 15 to 30 seconds.  Repeat 2 to 4 times.  It's a good idea to repeat these steps with your other leg.  Double knee-to-chest stretch    Lie on your back with your knees bent and your feet flat on the floor. You can put a small pillow under your head and neck if it is more  comfortable.  Bring your knees to your chest and hold them with your hands. Or you can hold at the fronts of your shins or behind your thighs.  Keep your lower back pressed to the floor. Hold the stretch for 15 to 30 seconds.  Relax, and lower your knees to the starting position.  Repeat 2 to 4 times.  Follow-up care is a key part of your treatment and safety. Be sure to make and go to all appointments, and call your doctor if you are having problems. It's also a good idea to know your test results and keep a list of the medicines you take.  Current as of: February 11, 2023  Content Version: 14.6   2024-2025 [place], [place].   Care instructions adapted under license by [facility]. If you have questions about a medical condition or this instruction, always ask your healthcare professional. Romayne Alderman, [facility], disclaims any warranty or liability for your use of this information  "

## 2024-07-28 MED ORDER — METHYLPREDNISOLONE ACETATE 80 MG/ML IJ SUSP
80 | Freq: Once | INTRAMUSCULAR | Status: AC
Start: 2024-07-28 — End: ?

## 2024-07-28 MED ORDER — BUPIVACAINE HCL 0.25 % IJ SOLN
0.25 | Freq: Once | INTRAMUSCULAR | Status: AC
Start: 2024-07-28 — End: ?

## 2024-07-29 ENCOUNTER — Inpatient Hospital Stay
Admit: 2024-07-29 | Discharge: 2024-07-29 | Payer: PRIVATE HEALTH INSURANCE | Attending: Internal Medicine | Primary: Nurse Practitioner

## 2024-07-29 DIAGNOSIS — D3A019 Benign carcinoid tumor of the small intestine, unspecified portion: Secondary | ICD-10-CM

## 2024-07-29 MED ORDER — OCTREOTIDE ACETATE 30 MG IM KIT
30 | Freq: Once | INTRAMUSCULAR | Status: AC
Start: 2024-07-29 — End: 2024-07-29
  Administered 2024-07-29: 16:00:00 30 mg via INTRAMUSCULAR

## 2024-07-29 MED FILL — SANDOSTATIN LAR DEPOT 30 MG IM KIT: 30 mg | INTRAMUSCULAR | Qty: 1 | Fill #0

## 2024-07-29 NOTE — Progress Notes (Signed)
"  Pt here ambulatory for sandostatin  injection  Orders reviewed  Pt tolerated treatment well  Pt discharged with schedule  "

## 2024-08-01 ENCOUNTER — Encounter

## 2024-08-01 MED ORDER — CYCLOBENZAPRINE HCL 10 MG PO TABS
10 | ORAL_TABLET | Freq: Every evening | ORAL | 0 refills | 13.00000 days | Status: AC | PRN
Start: 2024-08-01 — End: 2024-08-11

## 2024-08-01 NOTE — Telephone Encounter (Signed)
 Last visit: 07/27/24  Nest visit: N/A

## 2024-08-01 NOTE — Telephone Encounter (Signed)
 Monica Wood is a 49 y.o. year old female who presents to the clinic today for follow up chronic left hip pain and evaluation of increasing left ankle pain.     Last seen 07/27/24    Meds pended for review. Thanks

## 2024-08-02 ENCOUNTER — Ambulatory Visit
Admit: 2024-08-02 | Discharge: 2024-08-02 | Payer: PRIVATE HEALTH INSURANCE | Attending: Nurse Practitioner | Primary: Nurse Practitioner

## 2024-08-02 NOTE — Progress Notes (Signed)
 [place] [facility]   HEALTH - [facility]  [address]  [address]  [place] [address]  Dept: [phone]  Dept Fax: [phone]    Monica Wood is a 49 y.o. female who presentstoday for her medical conditions/complaints as noted below.  [name] is c/o of  Chief Complaint   Patient presents with    6 Month Follow-Up         HPI:     History of Present Illness  The patient presents for evaluation of unresectable tumor, neck and shoulder pain, acne, anemia, and prediabetes.    She saw the [facility], where she was informed that her condition is unresectable without developing short bowel syndrome. Her treatment regimen includes octreotide , which she administers herself as needed, typically once a month. She has a PET scan scheduled for 09/2024. She is also focusing on her mental and spiritual health, engaging in activities such as meditation, yoga, and Reiki classes. She recently completed a technology fast, abstaining from television, phone games, and internet use except for class-related purposes. She is currently unemployed and is taking a medical billing and coding class. She is on hormone replacement therapy and reports no issues.    She received a hip injection from zach boone PA and discussed her back pain. She was offered a referral for a back injection, which she had successfully received years ago. However, she is experiencing more pain in her neck and shoulders, which she attributes to tension. She is considering massage therapy and is practicing stress management techniques such as breathing exercises and stretching.    She has been working on her anemia and has switched to a different medication. She had to stop taking one medication due to excessive acne on her face, which she believes was caused by high ferritin levels. Her acne is now improving.    She is prediabetic and has an appointment with Dr. Tamela in 09/2024.    She is seeing  Kate Borer with urogynecology who did her testosterone  injection.    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  Sidebranch IPMN, 2 mm per MRI  Microcytic anemia  Iron deficiency  Hoarseness of voice  Hemoglobin A1C (%)   Date Value   02/12/2024 5.9   04/18/2020 5.5             ( goal A1C is < 7)   No components found for: LABMICR  No components found for: LDLCHOLESTEROL, LDLCALC    (goal LDL is <100)   AST (U/L)   Date Value   07/04/2024 30     ALT (U/L)   Date Value   07/04/2024 44 (H)     BUN (mg/dL)   Date Value   87/77/7974 10     BP Readings from Last 3 Encounters:   08/02/24 104/66   07/18/24 106/74   07/01/24 105/65          (goal120/80)    Past Medical History:   Diagnosis Date    Anemia     Anxiety and depression     Arthritis     Breast pain     Bursitis     Carcinoid tumor (HCC)     Chronic diarrhea     Depression 2020    Digestive problems     Female genital  prolapse, unspecified     Fibromyalgia     Irritable bowel syndrome 2001    Obesity 1985    Osteoarthritis 2019    Retention of urine     Scoliosis     STD (sexually transmitted disease)     Chlamydia in college    Tendinitis     Tension headache     Urinary incontinence     Vitamin D deficiency     Vitamin D deficiency       Past Surgical History:   Procedure Laterality Date    ABDOMINOPLASTY      COLONOSCOPY N/A 11/18/2022    COLONOSCOPY POLYPECTOMY SNARE BIOPSY performed by Agapito Heman, MD at [facility] [place] [facility]    ENDOMETRIAL ABLATION  2008    ESOPHAGOGASTRIC FUNDOPLICATION  2000    GALLBLADDER SURGERY      LAPAROTOMY N/A 11/19/2022    LAPAROTOMY EXPLORATORY.  SMALL BOWEL RESECTION.  INCISIONAL BIOPSY OF MESENTERIC LYMPH NODE performed by Teresa Landry LABOR, MD at [facility]    OTHER SURGICAL HISTORY      ablation    TUBAL LIGATION      UPPER  GASTROINTESTINAL ENDOSCOPY N/A 11/18/2022    ESOPHAGOGASTRODUODENOSCOPY BIOPSY performed by Agapito Heman, MD at [facility] [place] [facility]       Family History   Problem Relation Age of Onset    Diabetes Maternal Grandmother     Diabetes Father     Diabetes Mother     Allergy (Severe) Mother     Anemia Mother     Arthritis Mother     Depression Mother     Other Mother         ADHD    Breast Cancer Neg Hx     Ovarian Cancer Neg Hx     Endometrial Cancer Neg Hx     Uterine Cancer Neg Hx     Cervical Cancer Neg Hx     Vaginal Cancer Neg Hx     Colon Cancer Neg Hx        Social History     Tobacco Use    Smoking status: Never    Smokeless tobacco: Never   Substance Use Topics    Alcohol use: Not Currently     Alcohol/week: 4.0 standard drinks of alcohol     Types: 4 Glasses of wine per week     Comment: 3/4 times a week      Current Outpatient Medications   Medication Sig Dispense Refill    cyclobenzaprine  (FLEXERIL ) 10 MG tablet Take 1 tablet by mouth nightly as needed for Muscle spasms 10 tablet 0    estradiol  (VIVELLE ) 0.0375 MG/24HR APPLY 1 PATCH TOPICALLY TO THE SKIN 2 TIMES A WEEK 8 patch 3    lipase-protease-amylase (CREON ) 36000-114000 units CPEP delayed release capsule 1 capsule PO TID with meals 270 capsule 3    famotidine  (PEPCID ) 40 MG tablet Take 1 tablet by mouth nightly as needed (heartburn) 90 tablet 3    fluticasone  (FLONASE ) 50 MCG/ACT nasal spray 1 spray by Nasal route daily 3 each 1    octreotide  (SANDOSTATIN ) 50 MCG/ML SOLN INJECT 1 ML (50 MCG) UNDER THE SKIN EVERY 8 HOURS AS NEEDED (DIARRHEA) (DISCARD UNUSED PORTION) 270 mL 0    Multiple Vitamin (MULTIVITAMIN) TABS tablet Take 1 tablet by mouth daily      Ferrous Sulfate  (IRON SLOW RELEASE PO) Take 1 tablet by mouth daily      Fexofenadine HCl (ALLEGRA ALLERGY PO) Take  1 tablet by mouth daily      buPROPion  (WELLBUTRIN  XL) 150 MG extended release tablet Take 1 tablet by mouth every morning 90 tablet 1    progesterone  (PROMETRIUM ) 100 MG CAPS capsule Take 1  capsule by mouth daily 90 capsule 1    albuterol  sulfate HFA (PROVENTIL  HFA) 108 (90 Base) MCG/ACT inhaler Inhale 2 puffs into the lungs every 6 hours as needed for Wheezing or Shortness of Breath 18 g 2    DULoxetine  (CYMBALTA ) 60 MG extended release capsule Take 1 capsule by mouth daily 90 capsule 1    PREBIOTIC PRODUCT PO Take by mouth      Lisdexamfetamine Dimesylate 40 MG CAPS Take 40 mg by mouth daily.       Current Facility-Administered Medications   Medication Dose Route Frequency Provider Last Rate Last Admin    BUPivacaine  (MARCAINE ) 0.25 % injection 5 mg  2 mL Intra-artICUlar Once         methylPREDNISolone  acetate (DEPO-MEDROL ) injection 80 mg  80 mg Intra-artICUlar Once          Allergies   Allergen Reactions    Oxycodone Anaphylaxis    Percocet [Oxycodone-Acetaminophen ] Shortness Of Breath    Soybean-Containing Drug Products Anaphylaxis    Codeine      Anaphylaxis     Morphine      Parsley Leaves        Health Maintenance   Topic Date Due    DTaP/Tdap/Td vaccine (1 - Tdap) Never done    Hepatitis B vaccine (2 of 3 - 19+ 3-dose series) 06/03/2023    COVID-19 Vaccine (1 - 2024-25 season) Never done    Flu vaccine (1) 01/10/2025 (Originally 02/12/2024)    A1C test (Diabetic or Prediabetic)  02/11/2025    Depression Monitoring  07/31/2025    Breast cancer screen  09/24/2025    Cervical cancer screen  11/04/2028    Lipids  02/11/2029    Colorectal Cancer Screen  11/17/2032    Hepatitis A vaccine  Aged Out    Hib vaccine  Aged Out    Polio vaccine  Aged Out    Meningococcal (ACWY) vaccine  Aged Out    Meningococcal B vaccine  Aged Out    Pneumococcal 0-49 years Vaccine  Aged Out    Depression Screen  Discontinued    Diabetes screen  Discontinued    Hepatitis C screen  Discontinued    HIV screen  Discontinued       Subjective:      Review of Systems   Constitutional:  Negative for chills, fatigue and fever.   HENT:  Negative for ear discharge, ear pain, sinus pressure, sinus pain, sore throat and trouble  swallowing.    Eyes:  Negative for discharge, redness and itching.   Respiratory:  Negative for cough, chest tightness, shortness of breath and wheezing.    Cardiovascular:  Negative for chest pain.   Gastrointestinal:  Negative for abdominal pain, diarrhea, nausea and vomiting.   Genitourinary:  Negative for difficulty urinating.   Musculoskeletal:  Negative for arthralgias and neck pain.   Skin:  Negative for rash.   Neurological:  Negative for dizziness, weakness, light-headedness and headaches.   All other systems reviewed and are negative.      Objective:   BP 104/66   Pulse 74   Wt 91.1 kg (200 lb 12.8 oz)   SpO2 98%   BMI 34.47 kg/m     Physical Exam  General: Blood pressure is normal.  Weight is normal.    Physical Exam  Constitutional:       General: She is not in acute distress.     Appearance: Normal appearance. She is normal weight. She is not ill-appearing.   HENT:      Head: Normocephalic and atraumatic.      Nose: Nose normal. No congestion or rhinorrhea.      Mouth/Throat:      Mouth: Mucous membranes are moist.      Pharynx: Oropharynx is clear. No oropharyngeal exudate or posterior oropharyngeal erythema.   Eyes:      Extraocular Movements: Extraocular movements intact.      Conjunctiva/sclera: Conjunctivae normal.      Pupils: Pupils are equal, round, and reactive to light.   Cardiovascular:      Rate and Rhythm: Normal rate and regular rhythm.      Pulses: Normal pulses.      Heart sounds: Normal heart sounds. No murmur heard.  Pulmonary:      Effort: Pulmonary effort is normal. No respiratory distress.      Breath sounds: Normal breath sounds. No wheezing.   Musculoskeletal:         General: Normal range of motion.      Cervical back: Normal range of motion and neck supple.   Skin:     General: Skin is warm and dry.      Capillary Refill: Capillary refill takes less than 2 seconds.   Neurological:      General: No focal deficit present.      Mental Status: She is alert and oriented to  person, place, and time.      Motor: No weakness.      Coordination: Coordination normal.      Gait: Gait normal.   Psychiatric:         Mood and Affect: Mood normal.         Behavior: Behavior normal.         Thought Content: Thought content normal.           :       Diagnosis Orders   1. Prediabetes  Hemoglobin A1C      2. Carcinoid tumor of small intestine, unspecified location, unspecified whether malignant (HCC)        3. 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 Osteoarthritis of spine with radiculopathy, lumbar region        5. Trochanteric bursitis of both hips        6. Anemia, unspecified type                  :   Assessment & Plan     Assessment & Plan  1. Carcinoid tumor of jejunum  - The condition is unresectable without developing short bowel syndrome.  - Managed with octreotide  and has a PET scan scheduled for 09/2024.  - Engaging in mind-spirit wellness activities such as meditation, yoga, and Reiki classes.  - Using intermittent injections at home as needed.  Stable at this time. Follows with oncology     2. Neck and shoulder pain/hip pain  Follows with ortho. Recent hip injection.   - Significant pain and tightness in the neck and shoulders, likely due to tension.  - Advised to consider massage therapy and continue breathing and stretching exercises.  - Pain in the neck and shoulders persists despite efforts to de-stress.    3. Anemia:  - Anemia  appears to be improving.  - B12 and iron levels are within normal range.  - Ferritin levels were previously high, possibly contributing to acne.    4. Prediabetes:  - An A1c test will be added to the next lab work to monitor prediabetic status.  - No immediate need for blood draw; will be included in future labs.    Follow-up: The patient will follow up in 01/2025.      Return in about 6 months (around 01/30/2025) for physical .     Patient given educational materials - see patient instructions.  Discussed use, benefit, and side effects of  prescribed medications.  All patient questions answered. Pt voiced understanding. Reviewed health maintenance.  Instructed to continue current medications, diet and exercise.  Patient agreed with treatment plan. Follow up as directed.     Electronicallysigned by Harlene Pike, APRN - CNP on 08/02/2024 at 10:16 AM    The patient (or guardian, if applicable) and other individuals in attendance with the patient were advised that Artificial Intelligence will be utilized during this visit to record, process the conversation to generate a clinical note, and support improvement of the AI technology. The patient (or guardian, if applicable) and other individuals in attendance at the appointment consented to the use of AI, including the recording.
